# Patient Record
Sex: Male | Born: 1950
Health system: Southern US, Community
[De-identification: ages and names within clinical notes are randomized; demographics above are authoritative.]

## PROBLEM LIST (undated history)

## (undated) DIAGNOSIS — R945 Abnormal results of liver function studies: Secondary | ICD-10-CM

## (undated) DIAGNOSIS — I10 Essential (primary) hypertension: Secondary | ICD-10-CM

## (undated) DIAGNOSIS — Z87442 Personal history of urinary calculi: Secondary | ICD-10-CM

## (undated) DIAGNOSIS — K219 Gastro-esophageal reflux disease without esophagitis: Secondary | ICD-10-CM

## (undated) DIAGNOSIS — C9141 Hairy cell leukemia, in remission: Secondary | ICD-10-CM

## (undated) DIAGNOSIS — G4730 Sleep apnea, unspecified: Secondary | ICD-10-CM

## (undated) DIAGNOSIS — K589 Irritable bowel syndrome without diarrhea: Secondary | ICD-10-CM

## (undated) DIAGNOSIS — R740 Nonspecific elevation of levels of transaminase and lactic acid dehydrogenase [LDH]: Secondary | ICD-10-CM

## (undated) DIAGNOSIS — R7401 Elevation of levels of liver transaminase levels: Secondary | ICD-10-CM

## (undated) DIAGNOSIS — T8859XA Other complications of anesthesia, initial encounter: Secondary | ICD-10-CM

## (undated) DIAGNOSIS — M1990 Unspecified osteoarthritis, unspecified site: Secondary | ICD-10-CM

## (undated) DIAGNOSIS — N189 Chronic kidney disease, unspecified: Secondary | ICD-10-CM

## (undated) DIAGNOSIS — N400 Enlarged prostate without lower urinary tract symptoms: Secondary | ICD-10-CM

## (undated) HISTORY — DX: Benign prostatic hyperplasia without lower urinary tract symptoms: N40.0

## (undated) HISTORY — PX: OTHER SURGICAL HISTORY: SHX169

## (undated) HISTORY — DX: Irritable bowel syndrome without diarrhea: K58.9

## (undated) HISTORY — DX: Elevation of levels of liver transaminase levels: R74.01

## (undated) HISTORY — DX: Nonspecific elevation of levels of transaminase and lactic acid dehydrogenase (ldh): R74.0

## (undated) HISTORY — PX: TONSILLECTOMY: SUR1361

## (undated) HISTORY — DX: Abnormal results of liver function studies: R94.5

## (undated) HISTORY — PX: BACK SURGERY: SHX140

## (undated) HISTORY — DX: Hairy cell leukemia, in remission: C91.41

## (undated) HISTORY — PX: BONE MARROW BIOPSY: SHX199

## (undated) HISTORY — DX: Irritable bowel syndrome, unspecified: K58.9

---

## 1990-04-19 DIAGNOSIS — C9141 Hairy cell leukemia, in remission: Secondary | ICD-10-CM

## 1990-04-19 HISTORY — DX: Hairy cell leukemia, in remission: C91.41

## 1998-09-04 ENCOUNTER — Other Ambulatory Visit: Admission: RE | Admit: 1998-09-04 | Discharge: 1998-09-04 | Payer: Self-pay | Admitting: Oncology

## 1998-09-29 ENCOUNTER — Inpatient Hospital Stay (HOSPITAL_COMMUNITY): Admission: AD | Admit: 1998-09-29 | Discharge: 1998-10-30 | Payer: Self-pay | Admitting: Oncology

## 1998-09-29 ENCOUNTER — Encounter: Payer: Self-pay | Admitting: Oncology

## 1998-10-10 ENCOUNTER — Encounter: Payer: Self-pay | Admitting: Oncology

## 1998-10-25 ENCOUNTER — Encounter: Payer: Self-pay | Admitting: Oncology

## 1998-11-02 ENCOUNTER — Ambulatory Visit (HOSPITAL_COMMUNITY): Admission: RE | Admit: 1998-11-02 | Discharge: 1998-11-02 | Payer: Self-pay | Admitting: Oncology

## 1998-12-04 ENCOUNTER — Ambulatory Visit: Admission: RE | Admit: 1998-12-04 | Discharge: 1998-12-04 | Payer: Self-pay | Admitting: Oncology

## 2001-05-15 ENCOUNTER — Ambulatory Visit (HOSPITAL_COMMUNITY): Admission: RE | Admit: 2001-05-15 | Discharge: 2001-05-15 | Payer: Self-pay | Admitting: Internal Medicine

## 2005-12-02 ENCOUNTER — Encounter (INDEPENDENT_AMBULATORY_CARE_PROVIDER_SITE_OTHER): Payer: Self-pay | Admitting: *Deleted

## 2005-12-02 ENCOUNTER — Ambulatory Visit: Payer: Self-pay | Admitting: Oncology

## 2005-12-02 ENCOUNTER — Ambulatory Visit (HOSPITAL_COMMUNITY): Admission: RE | Admit: 2005-12-02 | Discharge: 2005-12-02 | Payer: Self-pay | Admitting: Oncology

## 2005-12-02 ENCOUNTER — Encounter: Payer: Self-pay | Admitting: Oncology

## 2006-02-09 ENCOUNTER — Ambulatory Visit: Payer: Self-pay | Admitting: Oncology

## 2006-02-12 ENCOUNTER — Ambulatory Visit: Payer: Self-pay | Admitting: Oncology

## 2006-04-19 HISTORY — PX: COLONOSCOPY: SHX174

## 2006-06-13 ENCOUNTER — Ambulatory Visit: Payer: Self-pay | Admitting: Oncology

## 2006-06-15 LAB — COMPREHENSIVE METABOLIC PANEL
ALT: 38 U/L (ref 0–53)
Alkaline Phosphatase: 103 U/L (ref 39–117)
Creatinine, Ser: 1.09 mg/dL (ref 0.40–1.50)
Glucose, Bld: 76 mg/dL (ref 70–99)
Sodium: 142 mEq/L (ref 135–145)
Total Bilirubin: 0.5 mg/dL (ref 0.3–1.2)
Total Protein: 6.6 g/dL (ref 6.0–8.3)

## 2006-06-15 LAB — CBC WITH DIFFERENTIAL/PLATELET
EOS%: 0.7 % (ref 0.0–7.0)
LYMPH%: 15.3 % (ref 14.0–48.0)
MCH: 31 pg (ref 28.0–33.4)
MCHC: 35 g/dL (ref 32.0–35.9)
MCV: 88.6 fL (ref 81.6–98.0)
MONO%: 8.2 % (ref 0.0–13.0)
Platelets: 174 10*3/uL (ref 145–400)
RBC: 4.85 10*6/uL (ref 4.20–5.71)
RDW: 13.6 % (ref 11.2–14.6)

## 2006-06-15 LAB — LACTATE DEHYDROGENASE: LDH: 168 U/L (ref 94–250)

## 2006-06-15 LAB — CHCC SMEAR

## 2006-07-12 ENCOUNTER — Ambulatory Visit (HOSPITAL_COMMUNITY): Admission: RE | Admit: 2006-07-12 | Discharge: 2006-07-12 | Payer: Self-pay | Admitting: Internal Medicine

## 2006-07-12 ENCOUNTER — Encounter (INDEPENDENT_AMBULATORY_CARE_PROVIDER_SITE_OTHER): Payer: Self-pay | Admitting: *Deleted

## 2006-07-12 ENCOUNTER — Ambulatory Visit: Payer: Self-pay | Admitting: Internal Medicine

## 2006-08-02 ENCOUNTER — Ambulatory Visit (HOSPITAL_COMMUNITY): Admission: RE | Admit: 2006-08-02 | Discharge: 2006-08-02 | Payer: Self-pay | Admitting: Internal Medicine

## 2006-12-14 ENCOUNTER — Ambulatory Visit: Payer: Self-pay | Admitting: Oncology

## 2006-12-16 LAB — COMPREHENSIVE METABOLIC PANEL
ALT: 34 U/L (ref 0–53)
CO2: 26 mEq/L (ref 19–32)
Calcium: 9.1 mg/dL (ref 8.4–10.5)
Chloride: 107 mEq/L (ref 96–112)
Creatinine, Ser: 1.12 mg/dL (ref 0.40–1.50)
Glucose, Bld: 101 mg/dL — ABNORMAL HIGH (ref 70–99)
Total Protein: 6.4 g/dL (ref 6.0–8.3)

## 2006-12-16 LAB — CBC WITH DIFFERENTIAL/PLATELET
BASO%: 0.2 % (ref 0.0–2.0)
Basophils Absolute: 0 10*3/uL (ref 0.0–0.1)
HCT: 41.4 % (ref 38.7–49.9)
HGB: 14.6 g/dL (ref 13.0–17.1)
LYMPH%: 15 % (ref 14.0–48.0)
MCHC: 35.4 g/dL (ref 32.0–35.9)
MONO#: 0.5 10*3/uL (ref 0.1–0.9)
NEUT%: 76.5 % — ABNORMAL HIGH (ref 40.0–75.0)
Platelets: 175 10*3/uL (ref 145–400)
WBC: 6.9 10*3/uL (ref 4.0–10.0)
lymph#: 1 10*3/uL (ref 0.9–3.3)

## 2006-12-16 LAB — MORPHOLOGY: PLT EST: ADEQUATE

## 2006-12-16 LAB — CHCC SMEAR

## 2006-12-16 LAB — LACTATE DEHYDROGENASE: LDH: 173 U/L (ref 94–250)

## 2007-06-02 ENCOUNTER — Ambulatory Visit: Payer: Self-pay | Admitting: Oncology

## 2007-06-06 LAB — COMPREHENSIVE METABOLIC PANEL
ALT: 38 U/L (ref 0–53)
BUN: 17 mg/dL (ref 6–23)
CO2: 29 mEq/L (ref 19–32)
Creatinine, Ser: 1.08 mg/dL (ref 0.40–1.50)
Total Bilirubin: 0.8 mg/dL (ref 0.3–1.2)

## 2007-06-06 LAB — CBC WITH DIFFERENTIAL/PLATELET
HCT: 42.5 % (ref 38.7–49.9)
HGB: 15 g/dL (ref 13.0–17.1)
MCH: 31.1 pg (ref 28.0–33.4)
MCHC: 35.3 g/dL (ref 32.0–35.9)
MONO%: 7.5 % (ref 0.0–13.0)
Platelets: 180 10*3/uL (ref 145–400)
lymph#: 1.1 10*3/uL (ref 0.9–3.3)

## 2007-06-06 LAB — CHCC SMEAR

## 2007-06-06 LAB — LACTATE DEHYDROGENASE: LDH: 168 U/L (ref 94–250)

## 2007-06-06 LAB — MORPHOLOGY

## 2007-09-01 ENCOUNTER — Encounter (INDEPENDENT_AMBULATORY_CARE_PROVIDER_SITE_OTHER): Payer: Self-pay | Admitting: General Surgery

## 2007-09-01 ENCOUNTER — Ambulatory Visit: Payer: Self-pay | Admitting: Oncology

## 2007-09-01 ENCOUNTER — Ambulatory Visit (HOSPITAL_COMMUNITY): Admission: RE | Admit: 2007-09-01 | Discharge: 2007-09-01 | Payer: Self-pay | Admitting: General Surgery

## 2007-12-01 ENCOUNTER — Ambulatory Visit: Payer: Self-pay | Admitting: Oncology

## 2007-12-05 LAB — CBC WITH DIFFERENTIAL/PLATELET
Basophils Absolute: 0 10*3/uL (ref 0.0–0.1)
EOS%: 0.7 % (ref 0.0–7.0)
Eosinophils Absolute: 0 10*3/uL (ref 0.0–0.5)
LYMPH%: 21.2 % (ref 14.0–48.0)
MCH: 31 pg (ref 28.0–33.4)
MCV: 89.7 fL (ref 81.6–98.0)
MONO%: 7 % (ref 0.0–13.0)
NEUT#: 4 10*3/uL (ref 1.5–6.5)
Platelets: 184 10*3/uL (ref 145–400)
RBC: 4.91 10*6/uL (ref 4.20–5.71)
RDW: 14.3 % (ref 11.2–14.6)

## 2007-12-05 LAB — MORPHOLOGY: RBC Comments: NORMAL

## 2008-03-01 ENCOUNTER — Ambulatory Visit: Payer: Self-pay | Admitting: Oncology

## 2008-03-05 LAB — CBC WITH DIFFERENTIAL/PLATELET
BASO%: 0.2 % (ref 0.0–2.0)
EOS%: 1.4 % (ref 0.0–7.0)
Eosinophils Absolute: 0.1 10*3/uL (ref 0.0–0.5)
LYMPH%: 18.6 % (ref 14.0–48.0)
MCH: 30.9 pg (ref 28.0–33.4)
MCHC: 34.1 g/dL (ref 32.0–35.9)
MCV: 90.5 fL (ref 81.6–98.0)
MONO%: 7.5 % (ref 0.0–13.0)
NEUT#: 4.1 10*3/uL (ref 1.5–6.5)
Platelets: 183 10*3/uL (ref 145–400)
RBC: 4.96 10*6/uL (ref 4.20–5.71)
RDW: 13.3 % (ref 11.2–14.6)

## 2008-03-05 LAB — MORPHOLOGY: RBC Comments: NORMAL

## 2008-06-27 ENCOUNTER — Ambulatory Visit: Payer: Self-pay | Admitting: Oncology

## 2008-07-01 LAB — MORPHOLOGY
PLT EST: ADEQUATE
RBC Comments: NORMAL

## 2008-07-01 LAB — CBC WITH DIFFERENTIAL/PLATELET
Basophils Absolute: 0 10*3/uL (ref 0.0–0.1)
Eosinophils Absolute: 0.1 10*3/uL (ref 0.0–0.5)
HCT: 46.3 % (ref 38.4–49.9)
HGB: 15.8 g/dL (ref 13.0–17.1)
LYMPH%: 18.5 % (ref 14.0–49.0)
MCV: 91.4 fL (ref 79.3–98.0)
MONO%: 7.6 % (ref 0.0–14.0)
NEUT#: 5.8 10*3/uL (ref 1.5–6.5)
NEUT%: 72.7 % (ref 39.0–75.0)
Platelets: 212 10*3/uL (ref 140–400)

## 2008-10-24 ENCOUNTER — Ambulatory Visit: Payer: Self-pay | Admitting: Oncology

## 2009-02-27 ENCOUNTER — Ambulatory Visit: Payer: Self-pay | Admitting: Oncology

## 2009-03-03 LAB — CBC WITH DIFFERENTIAL/PLATELET
Basophils Absolute: 0 10*3/uL (ref 0.0–0.1)
EOS%: 0.4 % (ref 0.0–7.0)
HGB: 15.7 g/dL (ref 13.0–17.1)
LYMPH%: 12.8 % — ABNORMAL LOW (ref 14.0–49.0)
MCH: 31.6 pg (ref 27.2–33.4)
MCV: 92.6 fL (ref 79.3–98.0)
MONO%: 4.5 % (ref 0.0–14.0)
NEUT%: 81.9 % — ABNORMAL HIGH (ref 39.0–75.0)
Platelets: 253 10*3/uL (ref 140–400)
RDW: 13.8 % (ref 11.0–14.6)

## 2009-03-03 LAB — COMPREHENSIVE METABOLIC PANEL
ALT: 81 U/L — ABNORMAL HIGH (ref 0–53)
Albumin: 4.7 g/dL (ref 3.5–5.2)
CO2: 30 mEq/L (ref 19–32)
Chloride: 102 mEq/L (ref 96–112)
Glucose, Bld: 102 mg/dL — ABNORMAL HIGH (ref 70–99)
Potassium: 4.5 mEq/L (ref 3.5–5.3)
Sodium: 142 mEq/L (ref 135–145)
Total Protein: 7 g/dL (ref 6.0–8.3)

## 2009-03-03 LAB — SEDIMENTATION RATE: Sed Rate: 17 mm/hr — ABNORMAL HIGH (ref 0–16)

## 2009-03-12 LAB — HEPATIC FUNCTION PANEL
Alkaline Phosphatase: 126 U/L — ABNORMAL HIGH (ref 39–117)
Indirect Bilirubin: 0.3 mg/dL (ref 0.0–0.9)
Total Bilirubin: 0.4 mg/dL (ref 0.3–1.2)
Total Protein: 6.8 g/dL (ref 6.0–8.3)

## 2009-03-25 ENCOUNTER — Ambulatory Visit (HOSPITAL_COMMUNITY): Admission: RE | Admit: 2009-03-25 | Discharge: 2009-03-25 | Payer: Self-pay | Admitting: Oncology

## 2009-03-28 ENCOUNTER — Ambulatory Visit: Payer: Self-pay | Admitting: Oncology

## 2009-04-01 LAB — GAMMA GT: GGT: 158 U/L — ABNORMAL HIGH (ref 7–51)

## 2009-04-01 LAB — HEPATIC FUNCTION PANEL
ALT: 57 U/L — ABNORMAL HIGH (ref 0–53)
AST: 31 U/L (ref 0–37)
Bilirubin, Direct: 0.1 mg/dL (ref 0.0–0.3)

## 2009-06-26 ENCOUNTER — Ambulatory Visit: Payer: Self-pay | Admitting: Oncology

## 2009-06-30 LAB — CBC WITH DIFFERENTIAL/PLATELET
BASO%: 0.2 % (ref 0.0–2.0)
EOS%: 0.8 % (ref 0.0–7.0)
HCT: 44.2 % (ref 38.4–49.9)
HGB: 15.2 g/dL (ref 13.0–17.1)
MCH: 31.8 pg (ref 27.2–33.4)
MCV: 92.2 fL (ref 79.3–98.0)
MONO%: 6.1 % (ref 0.0–14.0)
NEUT#: 4.9 10*3/uL (ref 1.5–6.5)
RBC: 4.8 10*6/uL (ref 4.20–5.82)
RDW: 14 % (ref 11.0–14.6)
WBC: 6.9 10*3/uL (ref 4.0–10.3)
lymph#: 1.5 10*3/uL (ref 0.9–3.3)

## 2009-06-30 LAB — MORPHOLOGY
PLT EST: ADEQUATE
RBC Comments: NORMAL

## 2009-10-30 ENCOUNTER — Ambulatory Visit: Payer: Self-pay | Admitting: Oncology

## 2009-11-03 LAB — CBC WITH DIFFERENTIAL/PLATELET
Basophils Absolute: 0 10*3/uL (ref 0.0–0.1)
EOS%: 0.8 % (ref 0.0–7.0)
HGB: 15 g/dL (ref 13.0–17.1)
MCH: 31.4 pg (ref 27.2–33.4)
MCV: 92.6 fL (ref 79.3–98.0)
MONO%: 6.6 % (ref 0.0–14.0)
RDW: 14 % (ref 11.0–14.6)

## 2009-11-03 LAB — MORPHOLOGY

## 2010-02-26 ENCOUNTER — Ambulatory Visit: Payer: Self-pay | Admitting: Oncology

## 2010-03-02 LAB — CBC WITH DIFFERENTIAL/PLATELET
BASO%: 0.2 % (ref 0.0–2.0)
EOS%: 0.7 % (ref 0.0–7.0)
Eosinophils Absolute: 0 10*3/uL (ref 0.0–0.5)
LYMPH%: 23.5 % (ref 14.0–49.0)
MCH: 31.2 pg (ref 27.2–33.4)
MCHC: 33.8 g/dL (ref 32.0–36.0)
MCV: 92.3 fL (ref 79.3–98.0)
MONO%: 7.9 % (ref 0.0–14.0)
Platelets: 148 10*3/uL (ref 140–400)
RBC: 4.91 10*6/uL (ref 4.20–5.82)

## 2010-03-02 LAB — COMPREHENSIVE METABOLIC PANEL
Albumin: 4.6 g/dL (ref 3.5–5.2)
BUN: 15 mg/dL (ref 6–23)
CO2: 30 mEq/L (ref 19–32)
Glucose, Bld: 65 mg/dL — ABNORMAL LOW (ref 70–99)
Sodium: 143 mEq/L (ref 135–145)
Total Bilirubin: 0.5 mg/dL (ref 0.3–1.2)
Total Protein: 7 g/dL (ref 6.0–8.3)

## 2010-03-02 LAB — LACTATE DEHYDROGENASE: LDH: 163 U/L (ref 94–250)

## 2010-03-02 LAB — MORPHOLOGY

## 2010-03-10 ENCOUNTER — Ambulatory Visit (HOSPITAL_COMMUNITY): Admission: RE | Admit: 2010-03-10 | Discharge: 2010-03-10 | Payer: Self-pay | Admitting: Oncology

## 2010-03-19 LAB — HEPATITIS PANEL, ACUTE
HCV Ab: NEGATIVE
Hep B C IgM: NEGATIVE
Hepatitis B Surface Ag: NEGATIVE

## 2010-05-09 ENCOUNTER — Encounter: Payer: Self-pay | Admitting: Oncology

## 2010-06-29 ENCOUNTER — Other Ambulatory Visit: Payer: Self-pay | Admitting: Oncology

## 2010-06-29 ENCOUNTER — Encounter (HOSPITAL_BASED_OUTPATIENT_CLINIC_OR_DEPARTMENT_OTHER): Payer: BC Managed Care – PPO | Admitting: Oncology

## 2010-06-29 DIAGNOSIS — C9140 Hairy cell leukemia not having achieved remission: Secondary | ICD-10-CM

## 2010-06-29 LAB — CBC WITH DIFFERENTIAL/PLATELET
BASO%: 0.4 % (ref 0.0–2.0)
EOS%: 0.5 % (ref 0.0–7.0)
HCT: 41.8 % (ref 38.4–49.9)
LYMPH%: 22 % (ref 14.0–49.0)
MCH: 31.6 pg (ref 27.2–33.4)
MCHC: 34.3 g/dL (ref 32.0–36.0)
NEUT%: 70.1 % (ref 39.0–75.0)
Platelets: 184 10*3/uL (ref 140–400)

## 2010-09-01 NOTE — Op Note (Signed)
Cameron Wu, MURTHA             ACCOUNT NO.:  [id number]   MEDICAL RECORD NO.:  [id number]          PATIENT TYPE:  AMB   LOCATION:  DAY                           FACILITY:  [facility]   PHYSICIAN:  Tilford Pillar, MD      DATE OF BIRTH:  Apr 01, 1951   DATE OF PROCEDURE:  09/01/2007  DATE OF DISCHARGE:                               OPERATIVE REPORT   PREOPERATIVE DIAGNOSIS:  Perirectal skin lesion.   POSTOPERATIVE DIAGNOSIS:  Perirectal skin lesion.   PROCEDURE:  Incisional biopsy of perirectal skin lesion with a 1-cm  incision.   SURGEON:  Tilford Pillar, MD   ANESTHESIA:  Sedation with laryngeal mask airway, local anesthetic 1%  lidocaine with epinephrine.   SPECIMEN:  Skin biopsy for cultures including fungal cultures and  pathologic evaluation.   ESTIMATED BLOOD LOSS:  Minimal.   INDICATIONS:  The patient is a 60 year old male who had presented  multiple times to my office with a noted perirectal erythema.  This had  been treated with both antibiotics as well as antifungal agents.  He  actually did show some improvement with the antifungals but had  immediate recurrence upon completion of his antifungal regimen.  At this  point, as he has had continued persistence of this erythema, it was  advised he undergo a skin biopsy to ensure no underlying etiology was  occurring.  The risks, benefits, and alternatives were discussed with  the patient.  The patient's questions and concerns were addressed.  The  patient was consented for planned procedure.   OPERATION IN DETAIL:  The patient was taken to the operating room.  He  was placed in a supine position on the operating table at which time the  sedation was administered.  Once the patient was asleep, laryngeal mask  airway was placed.  The patient was then placed in bilateral [place]  stirrups and was placed in a Trendelenburg position.  At this point,  prep was applied using a Betadine prep and drapes were placed in  standard  fashion.  Using a scalpel, an approximate 1-cm incisional  biopsy was created at the skin.  This was placed in the back table and  was divided for both pathology evaluation as well as microbiology  evaluation for possible fungal infections.  At this point, the local  anesthetic was instilled and 4-0 Monocryl was utilized to reapproximate  the skin edges.  Skin was washed and dried with moist and dry towel.  Antibiotic ointment was placed over the wound.  A sterile 4 x 4 dressing  was rolled and then placed into the gluteal crease over the biopsy site  and then the drapes removed.  The patient was allowed to  come out of general anesthetic.  He was transferred back to regular  hospital bed and was transferred to postanesthetic care unit in stable  condition.  At the conclusion of procedure, all instrument, sponge, and  needle counts were correct.  The patient tolerated the procedure well.      Tilford Pillar, MD  Electronically Signed     BZ/MEDQ  D:  09/01/2007  T:  09/01/2007  Job:  [id number]   cc:   Kingsley Callander. Ouida Sills, MD  Fax: [phone]

## 2010-09-01 NOTE — H&P (Signed)
Cameron Wu, Cameron Wu             ACCOUNT NO.:  [id number]   MEDICAL RECORD NO.:  [id number]          PATIENT TYPE:  AMB   LOCATION:  DAY                           FACILITY:  [facility]   PHYSICIAN:  Tilford Pillar, MD      DATE OF BIRTH:  19-Sep-1950   DATE OF ADMISSION:  09/01/2007  DATE OF DISCHARGE:  LH                              HISTORY & PHYSICAL   CHIEF COMPLAINT:  Perianal pruritus and erythema.   HISTORY OF PRESENT ILLNESS:  The patient is a 60 year old male who is  well known to me as an outpatient, who presented several months ago with  perianal pruritus and erythema.  This has been ongoing off and on over  the last several months.  His first evaluation in my office was on June 22, 2007.  He does have a history of hairy cell leukemia treatment, but  he last completed this treatment over 2 years ago.  He was given  initially some antibiotics by his primary physician with little success  and some application of some perianal cream, again with little success.  During his outpatient course with me, I did initiate him on some  antifungal medications with noted improvement, but had recurrence upon  completion of both courses, the last one being relatively long.  It was  discussed with the patient that should the symptomatology continue as  well as the erythema, a biopsy may be required.  He has not had any  history of diarrhea.  No pain in the area.  No dyspareunia, no  hematochezia, no melena.  No weight changes.   PAST MEDICAL HISTORY:  1. Hairy cell leukemia.  2. Seasonal allergies.   PAST SURGICAL HISTORY:  None.   MEDICATION:  He does take aspirin and Allegra.  He has been on treatment  of Diflucan for this with some resolution of the symptoms.  He is  currently off for evaluation with the biopsy.   ALLERGIES:  He is allergic to PENICILLIN.   SOCIAL HISTORY:  No tobacco use.  He occasionally drinks alcohol.  No  recreational drug use.   REVIEW OF SYSTEMS:  All systems  are unremarkable except for HPI.   PHYSICAL EXAMINATION:  GENERAL:  The patient is a healthy, somewhat thin  individual, does not appear cachectic.  He is calm.  He does not appear  in any acute distress.  He is alert and oriented x3.  HEENT:  Scalp, no deformities, no masses.  Eyes, pupils equal, round,  and reactive.  Extraocular movements are intact.  No conjunctival pallor  is noted.  NECK:  Trachea is midline.  No goiter.  No cervical lymphadenopathy.  PULMONARY:  Unlabored respirations.  He is clear to auscultation  bilaterally.  CARDIOVASCULAR:  Regular rate and rhythm.  Radial and dorsalis pedis 2+  pulses bilaterally.  ABDOMEN:  Positive bowel sounds.  Abdomen is soft and nontender.  No  hernias.  No masses.  RECTAL:  On perirectal examination, he does have perianal erythema,  slight discomfort on evaluation.  No masses.  No fissures or hemorrhoids  are noted.  GENITAL:  Normal external-appearing male genitalia.  Bilateral descended  testicles.  SKIN:  Warm and dry.   ASSESSMENT AND PLAN:  At this point, based on the continued course as  discussed with the patient, I do recommend biopsy of this area.  He does  have pruritus ani and my suspicion is a fungal etiology.  Although I  have a low suspicion of Paget disease or something such as __________. I  do recommend an incisional biopsy of this area to evaluate for any  underlying process.  As this appears simply to be a fungal etiology, a  long-term course of antifungal medications may be required.  This was  discussed at length with the patient.  The patient's questions and  concerns were addressed regarding the biopsy, and we will plan to  proceed with a biopsy in the operating room at the patient's earliest  convenience.      Tilford Pillar, MD  Electronically Signed     BZ/MEDQ  D:  08/31/2007  T:  09/01/2007  Job:  [id number]   cc:   Kingsley Callander. Ouida Sills, MD  Fax: [phone]   [facility]

## 2010-10-05 LAB — HEPATIC FUNCTION PANEL: ALT: 81 U/L — AB (ref 10–40)

## 2010-10-05 LAB — BASIC METABOLIC PANEL: Sodium: 141 mmol/L (ref 137–147)

## 2010-10-05 LAB — CBC AND DIFFERENTIAL
HCT: 44 % (ref 41–53)
Platelets: 159 10*3/uL (ref 150–399)

## 2010-11-02 ENCOUNTER — Encounter (HOSPITAL_BASED_OUTPATIENT_CLINIC_OR_DEPARTMENT_OTHER): Payer: BC Managed Care – PPO | Admitting: Oncology

## 2010-11-02 ENCOUNTER — Other Ambulatory Visit: Payer: Self-pay | Admitting: Oncology

## 2010-11-02 DIAGNOSIS — C9140 Hairy cell leukemia not having achieved remission: Secondary | ICD-10-CM

## 2010-11-02 LAB — CBC WITH DIFFERENTIAL/PLATELET
Basophils Absolute: 0 10*3/uL (ref 0.0–0.1)
EOS%: 0.9 % (ref 0.0–7.0)
HCT: 40.2 % (ref 38.4–49.9)
HGB: 13.9 g/dL (ref 13.0–17.1)
MCH: 32.3 pg (ref 27.2–33.4)
MCV: 93.5 fL (ref 79.3–98.0)
MONO%: 5.7 % (ref 0.0–14.0)
NEUT%: 68 % (ref 39.0–75.0)
Platelets: 169 10*3/uL (ref 140–400)

## 2010-11-02 LAB — MORPHOLOGY

## 2010-11-17 ENCOUNTER — Encounter (INDEPENDENT_AMBULATORY_CARE_PROVIDER_SITE_OTHER): Payer: Self-pay

## 2010-12-02 ENCOUNTER — Ambulatory Visit (INDEPENDENT_AMBULATORY_CARE_PROVIDER_SITE_OTHER): Payer: BC Managed Care – PPO | Admitting: Internal Medicine

## 2010-12-14 ENCOUNTER — Encounter (INDEPENDENT_AMBULATORY_CARE_PROVIDER_SITE_OTHER): Payer: Self-pay | Admitting: Internal Medicine

## 2010-12-14 ENCOUNTER — Ambulatory Visit (INDEPENDENT_AMBULATORY_CARE_PROVIDER_SITE_OTHER): Payer: BC Managed Care – PPO | Admitting: Internal Medicine

## 2010-12-14 ENCOUNTER — Other Ambulatory Visit (INDEPENDENT_AMBULATORY_CARE_PROVIDER_SITE_OTHER): Payer: Self-pay | Admitting: Internal Medicine

## 2010-12-14 VITALS — BP 110/60 | HR 72 | Temp 98.6°F | Ht 72.0 in | Wt 184.7 lb

## 2010-12-14 DIAGNOSIS — R945 Abnormal results of liver function studies: Secondary | ICD-10-CM

## 2010-12-14 NOTE — Progress Notes (Signed)
Subjective:     Patient ID: Cameron Wu, male   DOB: Jun 04, 1950, 60 y.o.   MRN: [id number]  HPI  Referred by Dr. Ouida Sills for elevated tranaminases.   Noted 10/06/2010 ALP normal 87, Bili 0.8,  AST 46H, ALT 82H.                          11/1                                                                   07/2009 ALP 97,                           AST 58H, ALT 94H               09/18/2009   ALP 90                            AST 24   ALT 38              03/03/2009 ALP 162                        AST 36,   ALT 81 03/20/2010:  Hepatitis B Surface Antigen Negative, Hepatitis B core Ab, IgM negative, Hepatitis A Antibody, IgM negative, Hepatitis C antibody negative. 03/10/2010 MRI Abdomen WO/W CM: Unremarkable MR examination of the abdomen. The liver is unremarkable. No focal lesions or infiltrative process. Normal size and contour. 03/25/2009: US abdomen for abnormal liver function: Negative abdominal US. CBD  3mm in diameter.  Appetite is good. No weight loss unintentional.  He occasionally has a nervous stomach, growling after he eats.  He usually has a BM daily. Denies rectal bleeding or melena.  No dysphagia. Appetite is good.   Current Outpatient Prescriptions  Medication Sig Dispense Refill  . aspirin 81 MG tablet Take 81 mg by mouth daily.        . cimetidine (TAGAMET) 200 MG tablet Take 200 mg by mouth as needed.        . diphenhydrAMINE (SOMINEX) 25 MG tablet Take 25 mg by mouth at bedtime as needed.        . fish oil-omega-3 fatty acids 1000 MG capsule Take 2 g by mouth daily.       Marland Kitchen ibuprofen (ADVIL,MOTRIN) 100 MG tablet Take 100 mg by mouth every 6 (six) hours as needed.        . loperamide (IMODIUM) 2 MG capsule Take 2 mg by mouth 4 (four) times daily as needed.        . loratadine (CLARITIN) 10 MG tablet Take 10 mg by mouth as needed.       . magnesium oxide (MAG-OX) 400 MG tablet Take 400 mg by mouth daily.        . multivitamin (THERAGRAN) per tablet Take 1 tablet by mouth daily.         . Tamsulosin HCl (FLOMAX) 0.4 MG CAPS Take by mouth.        . cladribine (LEUSTATIN) 1 MG/ML injection Inject into the vein once.        . loperamide (  IMODIUM) 1 MG/5ML solution Take by mouth 4 (four) times daily as needed.        . riTUXimab (RITUXAN) 10 MG/ML injection Inject into the vein.         Past Medical History  Diagnosis Date  . Hairy cell leukemia   . Elevated transaminase level   . Hairy cell leukemia     1992  . Irritable bowel syndrome     2 yrs ago, states he had inflammation around anus. Biopsy: inclusive   Past Surgical History  Procedure Date  . Hx colon polyps   . Colonoscopy 08    with polyps   No family history on file. Family Status  Relation Status Death Age  . Mother Deceased     breast cancer  . Father Deceased     cva  . Sister Alive     good health   History   Social History  . Marital Status: Married    Spouse Name: N/A    Number of Children: N/A  . Years of Education: N/A   Occupational History  . Not on file.   Social History Main Topics  . Smoking status: Never Smoker   . Smokeless tobacco: Not on file  . Alcohol Use: No  . Drug Use: No  . Sexually Active: Not on file   Other Topics Concern  . Not on file   Social History Narrative  . No narrative on file   He does not smoke, drink, or do drugs.    Review of Systems see hpi     Objective:   Physical Exam Blood pressure 110/60, pulse 72, temperature 98.6 F (37 C), height 6' (1.829 m), weight 184 lb 11.2 oz (83.779 kg).  Alert and oriented. Skin warm and dry.No jaundice Oral mucosa is moist. Natural teeth in good condition. Sclera anicteric, conjunctivae is pink. Thyroid not enlarged. No cervical lymphadenopathy. Lungs clear. Heart regular rate and rhythm.  Abdomen is soft. Bowel sounds are positive. No hepatomegaly. No abdominal masses felt. No tenderness.  No edema to lower extremities. Patient is alert and oriented.      Assessment:    Elevated transaminases  dating back to 2010.  So far his work up has been negative.  Auto immune process needs to be ruled out.  Plan:    Will repeat levels.  He will have follow up in one month.  C-met, Ferritin, SMA, ANA, ceruplasmin, Alpha 1 antitrypsin and sed rate.  He was encouraged to exercise. He will have an OV in 1 month while Dr Karilyn Cota is here in office.

## 2010-12-15 LAB — ALPHA-1-ANTITRYPSIN: A-1 Antitrypsin, Ser: 111 mg/dL (ref 90–200)

## 2010-12-22 ENCOUNTER — Telehealth (INDEPENDENT_AMBULATORY_CARE_PROVIDER_SITE_OTHER): Payer: Self-pay | Admitting: Internal Medicine

## 2010-12-22 DIAGNOSIS — K760 Fatty (change of) liver, not elsewhere classified: Secondary | ICD-10-CM

## 2010-12-22 NOTE — Telephone Encounter (Signed)
Labs were not drawn for this patient. I reordered.

## 2010-12-23 ENCOUNTER — Other Ambulatory Visit (INDEPENDENT_AMBULATORY_CARE_PROVIDER_SITE_OTHER): Payer: Self-pay | Admitting: Internal Medicine

## 2010-12-23 ENCOUNTER — Telehealth (INDEPENDENT_AMBULATORY_CARE_PROVIDER_SITE_OTHER): Payer: Self-pay | Admitting: Internal Medicine

## 2010-12-23 LAB — SEDIMENTATION RATE: Sed Rate: 4 mm/hr (ref 0–16)

## 2010-12-23 LAB — ANTI-SMOOTH MUSCLE ANTIBODY, IGG: Smooth Muscle Ab: 3 U (ref <20)

## 2010-12-23 LAB — HEPATIC FUNCTION PANEL
Albumin: 4.4 g/dL (ref 3.5–5.2)
Indirect Bilirubin: 0.4 mg/dL (ref 0.0–0.9)
Total Protein: 6.3 g/dL (ref 6.0–8.3)

## 2010-12-23 LAB — ANA: Anti Nuclear Antibody(ANA): NEGATIVE

## 2010-12-23 NOTE — Telephone Encounter (Signed)
Results given to patient

## 2011-01-11 ENCOUNTER — Encounter (INDEPENDENT_AMBULATORY_CARE_PROVIDER_SITE_OTHER): Payer: Self-pay | Admitting: Internal Medicine

## 2011-01-11 ENCOUNTER — Ambulatory Visit (INDEPENDENT_AMBULATORY_CARE_PROVIDER_SITE_OTHER): Payer: BC Managed Care – PPO | Admitting: Internal Medicine

## 2011-01-11 VITALS — BP 110/70 | HR 66 | Temp 99.0°F | Resp 12 | Ht 66.0 in

## 2011-01-11 DIAGNOSIS — R7402 Elevation of levels of lactic acid dehydrogenase (LDH): Secondary | ICD-10-CM

## 2011-01-11 DIAGNOSIS — R21 Rash and other nonspecific skin eruption: Secondary | ICD-10-CM

## 2011-01-11 MED ORDER — NYSTATIN-TRIAMCINOLONE 100000-0.1 UNIT/GM-% EX OINT: TOPICAL_OINTMENT | Freq: Two times a day (BID) | CUTANEOUS | Status: DC

## 2011-01-11 NOTE — Patient Instructions (Addendum)
Apply medication to perianal area twice daily for 2 weeks, then on as needed basis. LFT in 3 months.

## 2011-01-12 NOTE — Progress Notes (Signed)
Presenting complaint; Follow-up elevated transaminases;  patient also complains of perianal discomfort and pruritis. Subjective; Cameron Wu is 60 year old male who is hers for scheduled visit regarding elevated transaminases.These were noted be mildly elevated in nov,2010 but normal in June,2011 and up again in April,2011 and June,2012. In June,2012 his AST was 46 and ALT was 82 and AP was normal at 87. His markers for Hep A, B and C have been negative. [place]( 03/26/11) and MRI of liver(03/10/10) similarly did not reveal any abnormality. Since his last OV to our office he has had SMA, ANA, ceruloplasmin,  alpha-1 antitrypsin antibody,  ,Sed. Rate and serum Ferritin He received Hep B vaccination in 1994. have negative or normal.he denies abdominal pain, pruritis or fatigue. He has good apetite and has not lost any weight. He states the only thing that bothers him is recurrent burning itching involving perianal region. It started about three years ago; he had skin biopsy by Dr. Leticia Penna in [id number]  And it was normal; he was treated with diflucan but without relief. He denies rectal discharge or bleeding.  He is up to date on his Colonoscopy; last one was in 2008 and next one would be in 2013. Current medications; Current Outpatient Prescriptions on File Prior to Visit  Medication Sig Dispense Refill  . aspirin 81 MG tablet Take 81 mg by mouth daily.        . cimetidine (TAGAMET) 200 MG tablet Take 200 mg by mouth as needed.        . diphenhydrAMINE (SOMINEX) 25 MG tablet Take 25 mg by mouth at bedtime as needed.        . fish oil-omega-3 fatty acids 1000 MG capsule Take 1 g by mouth daily.       Marland Kitchen ibuprofen (ADVIL,MOTRIN) 100 MG tablet Take 100 mg by mouth every 6 (six) hours as needed.        . loratadine (CLARITIN) 10 MG tablet Take 10 mg by mouth as needed.       . magnesium oxide (MAG-OX) 400 MG tablet Take 400 mg by mouth daily.        . multivitamin (THERAGRAN) per tablet Take 1 tablet by mouth  daily.        . Tamsulosin HCl (FLOMAX) 0.4 MG CAPS Take by mouth.        Objective; BP 110/70  Pulse 66  Temp(Src) 99 F (37.2 C) (Oral)  Resp 12  Ht 5\' 6"  (1.676 m)  General:   Alert,  Well-developed, well-nourished, pleasant and cooperative in NAD Eyes:  Sclera clear, no icterus.   Conjunctiva pink. Mouth:  Oropharyngeal mucosa is normal. Neck:  Supple; no masses or thyromegaly. Heart:  Regular rate and rhythm; no murmurs, clicks, rubs,  or gallops. Lungs:  Clear to auscultation. Abdomen:  Soft, symmetrical, nontender and nondistended. No masses, hepatosplenomegaly or hernias noted. Normal bowel sounds, without guarding, and without rebound. Rectal examination reveals erythema to perianal skin anterior to anal opening.   Extremities:  Without clubbing or edema. Lab data; From 12/24/2010. Bili 0.5, AP 71, AST 31, ALT 47, Albumin 4.4. Assessment; #1. Mildly abnormal Transaminases with negative work-up; His AST and ALT are now normal; suspect he may have fatty liver not picked up on imaging studies. There is nothing in exam to suggest progressive liver disease. It remains to be seen if this trend continues. #1. Pruritis Ani; he has had this symptom for few years which makes it difficult to treat. Recommendations; LFTs in 3 months.  Mycolog-2 cream to be applied to perianal area bid for 2 weeks and then prn; he will call us with progress report in few weeks.

## 2011-01-13 LAB — FUNGUS CULTURE W SMEAR: Fungal Smear: NONE SEEN

## 2011-01-13 LAB — TISSUE CULTURE: Gram Stain: NONE SEEN

## 2011-03-01 ENCOUNTER — Telehealth: Payer: Self-pay | Admitting: Oncology

## 2011-03-01 NOTE — Telephone Encounter (Signed)
Called pt , left message, reminded pt of lab appt on 11/19, asked pt to call us to r/s MD visit that was cancelled due to [facility]

## 2011-03-08 ENCOUNTER — Other Ambulatory Visit (HOSPITAL_BASED_OUTPATIENT_CLINIC_OR_DEPARTMENT_OTHER): Payer: BC Managed Care – PPO | Admitting: Lab

## 2011-03-08 ENCOUNTER — Other Ambulatory Visit: Payer: Self-pay | Admitting: Oncology

## 2011-03-08 DIAGNOSIS — C9140 Hairy cell leukemia not having achieved remission: Secondary | ICD-10-CM

## 2011-03-08 LAB — CBC WITH DIFFERENTIAL/PLATELET
BASO%: 0.3 % (ref 0.0–2.0)
Eosinophils Absolute: 0 10*3/uL (ref 0.0–0.5)
LYMPH%: 26.6 % (ref 14.0–49.0)
MCH: 31.6 pg (ref 27.2–33.4)
MCHC: 34.1 g/dL (ref 32.0–36.0)
MCV: 92.8 fL (ref 79.3–98.0)
MONO%: 7.5 % (ref 0.0–14.0)
Platelets: 180 10*3/uL (ref 140–400)
RBC: 4.51 10*6/uL (ref 4.20–5.82)

## 2011-03-08 LAB — MORPHOLOGY: RBC Comments: NORMAL

## 2011-03-08 LAB — COMPREHENSIVE METABOLIC PANEL
Albumin: 4.2 g/dL (ref 3.5–5.2)
Alkaline Phosphatase: 72 U/L (ref 39–117)
BUN: 16 mg/dL (ref 6–23)
Glucose, Bld: 73 mg/dL (ref 70–99)
Potassium: 4.3 mEq/L (ref 3.5–5.3)

## 2011-03-10 ENCOUNTER — Telehealth: Payer: Self-pay | Admitting: *Deleted

## 2011-03-10 NOTE — Telephone Encounter (Signed)
VM left on pt's ans machine that labs done 03/08/11 with report that labs good per Dr. Cyndie Chime & these labs were faxed electronically to Dr. Carylon Perches.

## 2011-04-01 ENCOUNTER — Encounter (INDEPENDENT_AMBULATORY_CARE_PROVIDER_SITE_OTHER): Payer: Self-pay | Admitting: *Deleted

## 2011-04-12 ENCOUNTER — Other Ambulatory Visit (INDEPENDENT_AMBULATORY_CARE_PROVIDER_SITE_OTHER): Payer: Self-pay | Admitting: Internal Medicine

## 2011-04-12 LAB — HEPATIC FUNCTION PANEL
AST: 36 U/L (ref 0–37)
Bilirubin, Direct: 0.2 mg/dL (ref 0.0–0.3)
Total Bilirubin: 0.8 mg/dL (ref 0.3–1.2)

## 2011-04-22 ENCOUNTER — Telehealth (INDEPENDENT_AMBULATORY_CARE_PROVIDER_SITE_OTHER): Payer: Self-pay | Admitting: *Deleted

## 2011-04-22 NOTE — Telephone Encounter (Signed)
Per NUR the patient will need LFT in 6 months, unless he has had it done at another Physician's office.

## 2011-05-03 ENCOUNTER — Ambulatory Visit (HOSPITAL_BASED_OUTPATIENT_CLINIC_OR_DEPARTMENT_OTHER): Payer: BC Managed Care – PPO | Admitting: Oncology

## 2011-05-03 ENCOUNTER — Encounter: Payer: Self-pay | Admitting: Oncology

## 2011-05-03 VITALS — BP 129/75 | HR 64 | Temp 97.4°F | Wt 182.7 lb

## 2011-05-03 DIAGNOSIS — N400 Enlarged prostate without lower urinary tract symptoms: Secondary | ICD-10-CM

## 2011-05-03 DIAGNOSIS — K589 Irritable bowel syndrome without diarrhea: Secondary | ICD-10-CM

## 2011-05-03 DIAGNOSIS — R7989 Other specified abnormal findings of blood chemistry: Secondary | ICD-10-CM

## 2011-05-03 DIAGNOSIS — C9141 Hairy cell leukemia, in remission: Secondary | ICD-10-CM

## 2011-05-03 DIAGNOSIS — R945 Abnormal results of liver function studies: Secondary | ICD-10-CM

## 2011-05-03 DIAGNOSIS — C9140 Hairy cell leukemia not having achieved remission: Secondary | ICD-10-CM

## 2011-05-03 HISTORY — DX: Abnormal results of liver function studies: R94.5

## 2011-05-03 HISTORY — DX: Irritable bowel syndrome, unspecified: K58.9

## 2011-05-03 HISTORY — DX: Benign prostatic hyperplasia without lower urinary tract symptoms: N40.0

## 2011-05-03 HISTORY — DX: Other specified abnormal findings of blood chemistry: R79.89

## 2011-05-03 HISTORY — DX: Hairy cell leukemia, in remission: C91.41

## 2011-05-03 NOTE — Progress Notes (Signed)
Hematology and Oncology Follow Up Visit  Cameron Wu [id number] [birth year] 60 y.o. 05/03/2011 11:49 AM   Principle Diagnosis: Encounter Diagnoses  Name Primary?  . Hairy cell leukemia, in remission Yes  . IBS (irritable bowel syndrome)   . BPH (benign prostatic hypertrophy)   . Abnormal liver function tests      Interim History:   It is hard to believe that Cameron Wu is now 61 years old. He is in his third remission from hairy cell leukemia initially diagnosed in January 1992 he presented with constitutional symptoms and massive splenomegaly. He was treated with cladribine by a special exception [facility] before the drug was officially FDA approved. He had his first progression in July of 1995. He again achieved a complete response with another course of cladribine. He had a mother progression in June 2007. He was treated with a combination of cladribine plus Rituxan. He has remained in remission since that time. Most recent CBC done in this office on 03/08/11 with hemoglobin 14 hematocrit 42 white count 5200 with 65% neutrophils 27% lymphocytes 8 monocytes and platelet count 180,000., Review of the peripheral blood, I did not detect any abnormal lymphocytes. He has had no significant interim medical problems. He was put on Flomax by his urologist and thishas improved his urinary symptoms significantly.  He is seeing Dr. Dionicia Abler in consultation for persistent fluctuating mild liver function abnormalities. No additional testing ordered at this time. He got his flu vaccine this season. He has successfully stop smoking since 2007.  Medications: reviewed  Allergies:  Allergies  Allergen Reactions  . Penicillins     Review of Systems: Constitutional:   None Respiratory: None Cardiovascular:  Negative Gastrointestinal: Negative Genito-Urinary: See above Musculoskeletal: Negative Neurologic: Negative Skin: Not questioned Remaining ROS  negative.  Physical Exam: Blood pressure 129/75, pulse 64, temperature 97.4 F (36.3 C), weight 182 lb 11.2 oz (82.872 kg). Wt Readings from Last 3 Encounters:  05/03/11 182 lb 11.2 oz (82.872 kg)  12/14/10 184 lb 11.2 oz (83.779 kg)     General appearance: Thin Caucasian man HENNT: Normal pharynx no erythema or exudate  Lymph nodes: No cervical supraclavicular axillary or inguinal adenopathy Breasts: Lungs: Clear to auscultation resonant to percussion Heart: Regular cardiac rhythm no murmur Abdomen: Soft nontender no mass no organomegaly Extremities: No edema no calf tenderness Vascular: No cyanosis Neurologic: No focal deficits Skin: No rash or ecchymosis  Lab Results: Lab Results  Component Value Date   WBC 5.2 03/08/2011   HGB 14.3 03/08/2011   HCT 41.8 03/08/2011   MCV 92.8 03/08/2011   PLT 180 03/08/2011     Chemistry      Component Value Date/Time   NA 143 03/08/2011 1057   NA 143 03/08/2011 1057   NA 141 10/05/2010   K 4.3 03/08/2011 1057   K 4.3 03/08/2011 1057   CL 105 03/08/2011 1057   CL 105 03/08/2011 1057   CO2 31 03/08/2011 1057   CO2 31 03/08/2011 1057   BUN 16 03/08/2011 1057   BUN 16 03/08/2011 1057   CREATININE 1.02 03/08/2011 1057   CREATININE 1.02 03/08/2011 1057   GLU 103 10/05/2010      Component Value Date/Time   CALCIUM 9.2 03/08/2011 1057   CALCIUM 9.2 03/08/2011 1057   ALKPHOS 76 04/12/2011 0825   AST 36 04/12/2011 0825   ALT 50 04/12/2011 0825   BILITOT 0.8 04/12/2011 0825       Radiological Studies:  Impression and Plan: #1. Hairy cell leukemia in third remission. Plan continue every 6 month blood work an annual visits. We continue to make progress in this field and in the eventuality of a relapse we do have new and exciting treatments to offer him.  #2. Irritable bowel syndrome. Currently not active.  #3. BPH symptoms controlled on Flomax.  #4. Fluctuating mild liver function abnormalities with negative evaluation to  date.   CC:. Dr. Carylon Perches; Dr. Dennie Maizes; Dr Elaina Pattee   Levert Feinstein, MD 1/14/201311:49 AM

## 2011-07-21 ENCOUNTER — Encounter (INDEPENDENT_AMBULATORY_CARE_PROVIDER_SITE_OTHER): Payer: Self-pay | Admitting: *Deleted

## 2011-07-29 ENCOUNTER — Other Ambulatory Visit (INDEPENDENT_AMBULATORY_CARE_PROVIDER_SITE_OTHER): Payer: Self-pay | Admitting: *Deleted

## 2011-07-29 ENCOUNTER — Telehealth (INDEPENDENT_AMBULATORY_CARE_PROVIDER_SITE_OTHER): Payer: Self-pay | Admitting: *Deleted

## 2011-07-29 DIAGNOSIS — Z86010 Personal history of colonic polyps: Secondary | ICD-10-CM

## 2011-07-29 NOTE — Telephone Encounter (Signed)
Patient needs movi prep 

## 2011-07-30 MED ORDER — PEG-KCL-NACL-NASULF-NA ASC-C 100 G PO SOLR: 1.0000 | Freq: Once | ORAL | Status: DC

## 2011-08-17 ENCOUNTER — Encounter (INDEPENDENT_AMBULATORY_CARE_PROVIDER_SITE_OTHER): Payer: Self-pay

## 2011-09-01 ENCOUNTER — Telehealth (INDEPENDENT_AMBULATORY_CARE_PROVIDER_SITE_OTHER): Payer: Self-pay | Admitting: *Deleted

## 2011-09-01 NOTE — Telephone Encounter (Signed)
PCP/Requesting MD: fagan  Name & DOB: Cameron Wu 12/27/2050    Procedure: ycs  Reason/Indication:  Hx polyps  Has patient had this procedure before?  yes  If so, when, by whom and where?  2008  Is there a family history of colon cancer?  no  Who?  What age when diagnosed?    Is patient diabetic?   no      Does patient have prosthetic heart valve?  no  Do you have a pacemaker?  no  Has patient had joint replacement within last 12 months?  no  Is patient on Coumadin, Plavix and/or Aspirin? yes  Medications: asa 81 mg daily, fish oil 1000 mg daily, magnesium oxide 400 mg daily, multi vit, finasteride .5 mg daily, tamsulosin .4 mg daily, allergy relief antihistamine daily, ibuprofen 200 mg prn, acid reducer 200 mg prn, antidiarrhea 2 mg prn, otc sleep aid 50 mg prn  Allergies: pcn  Medication Adjustment: asa 2 days  Procedure date & time: 09/30/11 @ 730

## 2011-09-07 ENCOUNTER — Telehealth: Payer: Self-pay

## 2011-09-07 ENCOUNTER — Other Ambulatory Visit (HOSPITAL_BASED_OUTPATIENT_CLINIC_OR_DEPARTMENT_OTHER): Payer: BC Managed Care – PPO | Admitting: Lab

## 2011-09-07 DIAGNOSIS — R945 Abnormal results of liver function studies: Secondary | ICD-10-CM

## 2011-09-07 DIAGNOSIS — C9141 Hairy cell leukemia, in remission: Secondary | ICD-10-CM

## 2011-09-07 DIAGNOSIS — C9140 Hairy cell leukemia not having achieved remission: Secondary | ICD-10-CM

## 2011-09-07 LAB — CBC WITH DIFFERENTIAL/PLATELET
BASO%: 0.4 % (ref 0.0–2.0)
EOS%: 1.1 % (ref 0.0–7.0)
MCH: 31.8 pg (ref 27.2–33.4)
MCV: 92.6 fL (ref 79.3–98.0)
MONO%: 6.9 % (ref 0.0–14.0)
RBC: 4.54 10*6/uL (ref 4.20–5.82)
RDW: 14.2 % (ref 11.0–14.6)
lymph#: 1.7 10*3/uL (ref 0.9–3.3)
nRBC: 0 % (ref 0–0)

## 2011-09-07 LAB — MORPHOLOGY

## 2011-09-07 NOTE — Telephone Encounter (Signed)
Message copied by Albertha Ghee on Tue Sep 07, 2011  2:00 PM ------      Message from: Levert Feinstein      Created: Tue Sep 07, 2011  1:39 PM       Call pt - CBC stable/normal

## 2011-09-07 NOTE — Telephone Encounter (Signed)
agree

## 2011-09-07 NOTE — Telephone Encounter (Signed)
Message left on pt's VM with result note per Dr Cyndie Chime. dph

## 2011-09-09 ENCOUNTER — Telehealth: Payer: Self-pay | Admitting: *Deleted

## 2011-09-09 NOTE — Telephone Encounter (Signed)
Message copied by Sabino Snipes on Thu Sep 09, 2011  2:04 PM ------      Message from: Levert Feinstein      Created: Tue Sep 07, 2011  1:39 PM       Call pt - CBC stable/normal

## 2011-09-09 NOTE — Telephone Encounter (Signed)
Pt notified of lab results per Dr Granfortuna. 

## 2011-09-24 ENCOUNTER — Encounter (HOSPITAL_COMMUNITY): Payer: Self-pay | Admitting: Pharmacy Technician

## 2011-09-29 MED ORDER — SODIUM CHLORIDE 0.45 % IV SOLN
Freq: Once | INTRAVENOUS | Status: AC
  Administered 2011-09-30: 1000 mL via INTRAVENOUS

## 2011-09-30 ENCOUNTER — Ambulatory Visit (HOSPITAL_COMMUNITY)
Admission: RE | Admit: 2011-09-30 | Discharge: 2011-09-30 | Disposition: A | Discharge status: Home / Self Care | Payer: BC Managed Care – PPO | Source: Ambulatory Visit | Attending: Internal Medicine | Admitting: Internal Medicine

## 2011-09-30 ENCOUNTER — Encounter (HOSPITAL_COMMUNITY)
Admission: RE | Disposition: A | Discharge status: Home / Self Care | Payer: Self-pay | Source: Ambulatory Visit | Attending: Internal Medicine

## 2011-09-30 ENCOUNTER — Encounter (HOSPITAL_COMMUNITY): Payer: Self-pay | Admitting: *Deleted

## 2011-09-30 DIAGNOSIS — K5730 Diverticulosis of large intestine without perforation or abscess without bleeding: Secondary | ICD-10-CM | POA: Insufficient documentation

## 2011-09-30 DIAGNOSIS — K644 Residual hemorrhoidal skin tags: Secondary | ICD-10-CM

## 2011-09-30 DIAGNOSIS — Z860100 Personal history of colon polyps, unspecified: Secondary | ICD-10-CM | POA: Insufficient documentation

## 2011-09-30 DIAGNOSIS — Z09 Encounter for follow-up examination after completed treatment for conditions other than malignant neoplasm: Secondary | ICD-10-CM | POA: Insufficient documentation

## 2011-09-30 DIAGNOSIS — Z856 Personal history of leukemia: Secondary | ICD-10-CM | POA: Insufficient documentation

## 2011-09-30 HISTORY — DX: Gastro-esophageal reflux disease without esophagitis: K21.9

## 2011-09-30 HISTORY — PX: COLONOSCOPY: SHX5424

## 2011-09-30 SURGERY — COLONOSCOPY
Anesthesia: Moderate Sedation

## 2011-09-30 MED ORDER — MEPERIDINE HCL 50 MG/ML IJ SOLN
INTRAMUSCULAR | Status: DC | PRN
Start: 2011-09-30 — End: 2011-09-30
  Administered 2011-09-30 (×2): 25 mg via INTRAVENOUS

## 2011-09-30 MED ORDER — LIDOCAINE HCL 2 % EX GEL
CUTANEOUS | Status: AC
  Filled 2011-09-30: qty 30

## 2011-09-30 MED ORDER — MIDAZOLAM HCL 5 MG/5ML IJ SOLN
INTRAMUSCULAR | Status: DC | PRN
  Administered 2011-09-30 (×2): 2 mg via INTRAVENOUS

## 2011-09-30 MED ORDER — MIDAZOLAM HCL 5 MG/5ML IJ SOLN
INTRAMUSCULAR | Status: AC
  Filled 2011-09-30: qty 10

## 2011-09-30 MED ORDER — LIDOCAINE HCL 2 % EX GEL
CUTANEOUS | Status: DC | PRN
  Administered 2011-09-30: 1 via TOPICAL

## 2011-09-30 MED ORDER — MEPERIDINE HCL 50 MG/ML IJ SOLN
INTRAMUSCULAR | Status: AC
  Filled 2011-09-30: qty 1

## 2011-09-30 MED ORDER — SIMETHICONE 40 MG/0.6ML PO SUSP
ORAL | Status: DC | PRN
  Administered 2011-09-30: 08:00:00

## 2011-09-30 NOTE — H&P (Signed)
Cameron Wu is an 61 y.o. male.   Chief Complaint: Patient is here for colonoscopy. HPI: Patient is 13-year-old Caucasian male who has history of colonic adenomas and is here for surveillance colonoscopy. His last exam was in March 2008. He denies abdominal pain change in his bowel habits or rectal bleeding. Family history significant for multiple colonic polyps in his father but no history of CRC.  Past Medical History  Diagnosis Date  . Hairy cell leukemia   . Elevated transaminase level   . Hairy cell leukemia     1992  . Irritable bowel syndrome     2 yrs ago, states he had inflammation around anus. Biopsy: inclusive  . Hairy cell leukemia, in remission 05/03/2011  . IBS (irritable bowel syndrome) 05/03/2011  . BPH (benign prostatic hypertrophy) 05/03/2011  . Abnormal liver function tests 05/03/2011  . GERD (gastroesophageal reflux disease)     Past Surgical History  Procedure Date  . Hx colon polyps   . Colonoscopy 08    with polyps  . Bone marrow biopsy     2007  . Tonsillectomy     childhood    History reviewed. No pertinent family history. Social History:  reports that he quit smoking about 5 years ago. His smoking use included Cigarettes. He has never used smokeless tobacco. He reports that he drinks about 2.4 ounces of alcohol per week. He reports that he does not use illicit drugs.  Allergies:  Allergies  Allergen Reactions  . Penicillins Swelling    Medications Prior to Admission  Medication Sig Dispense Refill  . aspirin EC 81 MG tablet Take 81 mg by mouth every morning.      . cimetidine (TAGAMET) 200 MG tablet Take 200 mg by mouth daily as needed. For acid reflux      . diphenhydrAMINE (BENADRYL) 25 MG tablet Take 50 mg by mouth at bedtime.      . finasteride (PROSCAR) 5 MG tablet Take 5 mg by mouth every morning.      . fish oil-omega-3 fatty acids 1000 MG capsule Take 1 g by mouth daily at 12 noon.       Marland Kitchen ibuprofen (ADVIL,MOTRIN) 200 MG tablet Take 400  mg by mouth every 6 (six) hours as needed. For pain      . loperamide (IMODIUM) 2 MG capsule Take 2 mg by mouth as needed.       . loratadine (CLARITIN) 10 MG tablet Take 10 mg by mouth daily as needed. For allergies      . magnesium oxide (MAG-OX) 400 MG tablet Take 400 mg by mouth at bedtime.       . multivitamin (THERAGRAN) per tablet Take 1 tablet by mouth every morning.       . peg 3350 powder (MOVIPREP) SOLR Take 1 kit (100 g total) by mouth once.  1 kit  0  . Tamsulosin HCl (FLOMAX) 0.4 MG CAPS Take 0.4 mg by mouth at bedtime.         No results found for this or any previous visit (from the past 48 hour(s)). No results found.  ROS  Blood pressure 133/81, pulse 66, temperature 97.7 F (36.5 C), temperature source Oral, resp. rate 18, height 6' (1.829 m), weight 184 lb (83.462 kg), SpO2 98.00%. Physical Exam   Assessment/Plan History of colonic polyps. Surveillance colonoscopy.  Cameron Wu U 09/30/2011, 7:30 AM

## 2011-09-30 NOTE — Op Note (Signed)
COLONOSCOPY PROCEDURE REPORT  PATIENT:  Cameron Wu  MR#:  [id number] Birthdate:  [birth year], 61 y.o., male Endoscopist:  Dr. Malissa Hippo, MD Referred By:  Dr. Carylon Perches, MD Procedure Date: 09/30/2011  Procedure:   Colonoscopy  Indications: Patient is 61 year old Caucasian male with history of colonic adenomas.  Informed Consent:  The procedure and risks were reviewed with the patient and informed consent was obtained.  Medications:  Demerol 50 mg IV Versed 4 mg IV  Description of procedure:  After a digital rectal exam was performed, that colonoscope was advanced from the anus through the rectum and colon to the area of the cecum, ileocecal valve and appendiceal orifice. The cecum was deeply intubated. These structures were well-seen and photographed for the record. From the level of the cecum and ileocecal valve, the scope was slowly and cautiously withdrawn. The mucosal surfaces were carefully surveyed utilizing scope tip to flexion to facilitate fold flattening as needed. The scope was pulled down into the rectum where a thorough exam including retroflexion was performed. Terminal ileum was also examined.  Findings:   Normal terminal ileum. Scattered diverticula at sigmoid and transverse colon. No evidence of colonic polyps or other mucosal abnormalities. Normal rectal mucosa. Small hemorrhoids below the dentate line.  Therapeutic/Diagnostic Maneuvers Performed:  None  Complications:  None  Cecal Withdrawal Time:  9 minutes  Impression:  Normal terminal ileum. Scattered diverticula at sigmoid and transverse colon. Small external hemorrhoids. No evidence of recurrent polyps.   Recommendations:  Standard instructions given. Next colonoscopy in 7 years.  Cameron Wu  09/30/2011 7:55 AM  CC: Dr. Carylon Perches, MD & Dr. Bonnetta Barry ref. provider found

## 2011-09-30 NOTE — Discharge Instructions (Signed)
Resume usual medications and high fiber diet. No driving for 24 hours. Next colonoscopy in 7 years.  High Fiber Diet A high fiber diet changes your normal diet to include more whole grains, legumes, fruits, and vegetables. Changes in the diet involve replacing refined carbohydrates with unrefined foods. The calorie level of the diet is essentially unchanged. The Dietary Reference Intake (recommended amount) for adult males is 38 g per day. For adult females, it is 25 g per day. Pregnant and lactating women should consume 28 g of fiber per day. Fiber is the intact part of a plant that is not broken down during digestion. Functional fiber is fiber that has been isolated from the plant to provide a beneficial effect in the body. PURPOSE  Increase stool bulk.   Ease and regulate bowel movements.   Lower cholesterol.  INDICATIONS THAT YOU NEED MORE FIBER  Constipation and hemorrhoids.   Uncomplicated diverticulosis (intestine condition) and irritable bowel syndrome.   Weight management.   As a protective measure against hardening of the arteries (atherosclerosis), diabetes, and cancer.  NOTE OF CAUTION If you have a digestive or bowel problem, ask your caregiver for advice before adding high fiber foods to your diet. Some of the following medical problems are such that a high fiber diet should not be used without consulting your caregiver:  Acute diverticulitis (intestine infection).   Partial small bowel obstructions.   Complicated diverticular disease involving bleeding, rupture (perforation), or abscess (boil, furuncle).   Presence of autonomic neuropathy (nerve damage) or gastric paresis (stomach cannot empty itself).  GUIDELINES FOR INCREASING FIBER  Start adding fiber to the diet slowly. A gradual increase of about 5 more grams (2 slices of whole-wheat bread, 2 servings of most fruits or vegetables, or 1 bowl of high fiber cereal) per day is best. Too rapid an increase in fiber  may result in constipation, flatulence, and bloating.   Drink enough water and fluids to keep your urine clear or pale yellow. Water, juice, or caffeine-free drinks are recommended. Not drinking enough fluid may cause constipation.   Eat a variety of high fiber foods rather than one type of fiber.   Try to increase your intake of fiber through using high fiber foods rather than fiber pills or supplements that contain small amounts of fiber.   The goal is to change the types of food eaten. Do not supplement your present diet with high fiber foods, but replace foods in your present diet.  INCLUDE A VARIETY OF FIBER SOURCES  Replace refined and processed grains with whole grains, canned fruits with fresh fruits, and incorporate other fiber sources. White rice, white breads, and most bakery goods contain little or no fiber.   Brown whole-grain rice, buckwheat oats, and many fruits and vegetables are all good sources of fiber. These include: broccoli, Brussels sprouts, cabbage, cauliflower, beets, sweet potatoes, white potatoes (skin on), carrots, tomatoes, eggplant, squash, berries, fresh fruits, and dried fruits.   Cereals appear to be the richest source of fiber. Cereal fiber is found in whole grains and bran. Bran is the fiber-rich outer coat of cereal grain, which is largely removed in refining. In whole-grain cereals, the bran remains. In breakfast cereals, the largest amount of fiber is found in those with "bran" in their names. The fiber content is sometimes indicated on the label.   You may need to include additional fruits and vegetables each day.   In baking, for 1 cup white flour, you may use the following  substitutions:   1 cup whole-wheat flour minus 2 tbs.    cup white flour plus  cup whole-wheat flour.  Document Released: 04/05/2005 Document Revised: 03/25/2011 Document Reviewed: 02/11/2009 [facility] Patient Information 2012 [place], [place].

## 2011-10-06 ENCOUNTER — Encounter (HOSPITAL_COMMUNITY): Payer: Self-pay | Admitting: Internal Medicine

## 2011-10-14 ENCOUNTER — Other Ambulatory Visit (INDEPENDENT_AMBULATORY_CARE_PROVIDER_SITE_OTHER): Payer: Self-pay | Admitting: *Deleted

## 2011-10-14 ENCOUNTER — Encounter (INDEPENDENT_AMBULATORY_CARE_PROVIDER_SITE_OTHER): Payer: Self-pay | Admitting: *Deleted

## 2011-11-05 ENCOUNTER — Encounter (INDEPENDENT_AMBULATORY_CARE_PROVIDER_SITE_OTHER): Payer: Self-pay

## 2012-02-28 ENCOUNTER — Telehealth: Payer: Self-pay | Admitting: Oncology

## 2012-02-28 NOTE — Telephone Encounter (Signed)
Called patient left message regarding lab and Md r/s from 11/19 to 11/18 due to MD call day

## 2012-03-01 ENCOUNTER — Telehealth: Payer: Self-pay | Admitting: Oncology

## 2012-03-01 NOTE — Telephone Encounter (Signed)
Pt is aware of appt for 11/18th lab and MD

## 2012-03-06 ENCOUNTER — Other Ambulatory Visit (HOSPITAL_BASED_OUTPATIENT_CLINIC_OR_DEPARTMENT_OTHER): Payer: BC Managed Care – PPO | Admitting: Lab

## 2012-03-06 ENCOUNTER — Telehealth: Payer: Self-pay | Admitting: Oncology

## 2012-03-06 ENCOUNTER — Ambulatory Visit (HOSPITAL_BASED_OUTPATIENT_CLINIC_OR_DEPARTMENT_OTHER): Payer: BC Managed Care – PPO | Admitting: Oncology

## 2012-03-06 VITALS — BP 136/78 | HR 67 | Temp 98.2°F | Resp 20 | Ht 72.0 in | Wt 183.3 lb

## 2012-03-06 DIAGNOSIS — C9141 Hairy cell leukemia, in remission: Secondary | ICD-10-CM

## 2012-03-06 DIAGNOSIS — R945 Abnormal results of liver function studies: Secondary | ICD-10-CM

## 2012-03-06 DIAGNOSIS — C9140 Hairy cell leukemia not having achieved remission: Secondary | ICD-10-CM

## 2012-03-06 DIAGNOSIS — R7989 Other specified abnormal findings of blood chemistry: Secondary | ICD-10-CM

## 2012-03-06 DIAGNOSIS — K589 Irritable bowel syndrome without diarrhea: Secondary | ICD-10-CM

## 2012-03-06 LAB — COMPREHENSIVE METABOLIC PANEL (CC13)
ALT: 42 U/L (ref 0–55)
BUN: 20 mg/dL (ref 7.0–26.0)
CO2: 29 mEq/L (ref 22–29)
Calcium: 9.6 mg/dL (ref 8.4–10.4)
Chloride: 107 mEq/L (ref 98–107)
Creatinine: 1.1 mg/dL (ref 0.7–1.3)

## 2012-03-06 LAB — CBC WITH DIFFERENTIAL/PLATELET
BASO%: 0.4 % (ref 0.0–2.0)
Basophils Absolute: 0 10*3/uL (ref 0.0–0.1)
HCT: 42.7 % (ref 38.4–49.9)
HGB: 14.7 g/dL (ref 13.0–17.1)
MONO#: 0.5 10*3/uL (ref 0.1–0.9)
NEUT#: 4.1 10*3/uL (ref 1.5–6.5)
NEUT%: 64.8 % (ref 39.0–75.0)
WBC: 6.4 10*3/uL (ref 4.0–10.3)
lymph#: 1.7 10*3/uL (ref 0.9–3.3)

## 2012-03-06 LAB — LACTATE DEHYDROGENASE (CC13): LDH: 171 U/L (ref 125–245)

## 2012-03-06 LAB — CHCC SMEAR

## 2012-03-06 NOTE — Patient Instructions (Signed)
Lab every 4 months next 07/10/12 Visit with Dr Reece Agar 1 year  03/12/13

## 2012-03-06 NOTE — Telephone Encounter (Signed)
Gave patient lab and MD visit for 6 months starting March 2014

## 2012-03-07 ENCOUNTER — Other Ambulatory Visit: Payer: BC Managed Care – PPO | Admitting: Lab

## 2012-03-07 ENCOUNTER — Ambulatory Visit: Payer: BC Managed Care – PPO | Admitting: Oncology

## 2012-03-07 NOTE — Progress Notes (Signed)
Hematology and Oncology Follow Up Visit  Cameron Wu [id number] February 04, 1951 61 y.o. 03/07/2012 9:43 AM   Principle Diagnosis: Encounter Diagnosis  Name Primary?  . Hairy cell leukemia, in remission Yes     Interim History:   Followup visit for this pleasant 61 year old man. He is in his third remission from hairy cell leukemia initially diagnosed in January 1992 he presented with constitutional symptoms and massive splenomegaly. He was treated with cladribine by [facility] before the drug was officially FDA approved. He had his first progression in July of 1995. He again achieved a complete response with another course of cladribine. He had another progression in June 2007. He was treated with a combination of cladribine plus Rituxan. He has remained in remission since that time.  He has had no interim medical problems. He had a routine colonoscopy June 13. He reports some mild diverticular changes but no polyps. He did have some problems with intermittent diarrhea and low-grade liver dysfunction in the past. His symptoms have resolved. Most recent liver panel done through our office is normal.   Medications: reviewed  Allergies:  Allergies  Allergen Reactions  . Penicillins Swelling    Review of Systems: Constitutional:   No constitutional symptoms Respiratory: No cough or dyspnea Cardiovascular:  No chest pain or palpitations Gastrointestinal: No abdominal pain no change in bowel habit Genito-Urinary: No urinary tract symptoms Musculoskeletal: No muscle or bone pain Neurologic: No headache or change in vision Skin: No rash or ecchymosis Remaining ROS negative.  Physical Exam: Blood pressure 136/78, pulse 67, temperature 98.2 F (36.8 C), temperature source Oral, resp. rate 20, height 6' (1.829 m), weight 183 lb 4.8 oz (83.144 kg). Wt Readings from Last 3 Encounters:  03/06/12 183 lb 4.8 oz (83.144 kg)  09/30/11 184 lb  (83.462 kg)  09/30/11 184 lb (83.462 kg)     General appearance: Thin, adequately nourished Caucasian man HENNT: Pharynx no erythema or exudate Lymph nodes: No cervical, supraclavicular, or axillary adenopathy Breasts: Lungs: Clear to auscultation resonant to percussion Heart: Regular rhythm no murmur Abdomen: Soft, nontender, no mass, no organomegaly Extremities: No edema, no calf tenderness Vascular: No cyanosis Neurologic: No focal deficit Skin: No rash or ecchymosis  Lab Results: Lab Results: White count differential: 65% neutrophils, 26% lymphocytes, 8% monocytes   Component Value Date   WBC 6.4 03/06/2012   HGB 14.7 03/06/2012   HCT 42.7 03/06/2012   MCV 93.4 03/06/2012   PLT 165 03/06/2012     Chemistry      Component Value Date/Time   NA 142 03/06/2012 1439   NA 143 03/08/2011 1057   NA 143 03/08/2011 1057   NA 141 10/05/2010   K 4.2 03/06/2012 1439   K 4.3 03/08/2011 1057   K 4.3 03/08/2011 1057   CL 107 03/06/2012 1439   CL 105 03/08/2011 1057   CL 105 03/08/2011 1057   CO2 29 03/06/2012 1439   CO2 31 03/08/2011 1057   CO2 31 03/08/2011 1057   BUN 20.0 03/06/2012 1439   BUN 16 03/08/2011 1057   BUN 16 03/08/2011 1057   CREATININE 1.1 03/06/2012 1439   CREATININE 1.02 03/08/2011 1057   CREATININE 1.02 03/08/2011 1057   GLU 103 10/05/2010      Component Value Date/Time   CALCIUM 9.6 03/06/2012 1439   CALCIUM 9.2 03/08/2011 1057   CALCIUM 9.2 03/08/2011 1057   ALKPHOS 83 03/06/2012 1439   ALKPHOS 76 04/12/2011 0825  AST 30 03/06/2012 1439   AST 36 04/12/2011 0825   ALT 42 03/06/2012 1439   ALT 50 04/12/2011 0825   BILITOT 0.45 03/06/2012 1439   BILITOT 0.8 04/12/2011 0825    Review of the peripheral blood film: Benign reactive lymphocytes. No obvious malignant lymphocytes   Impression and Plan: #1. Hairy cell leukemia in third remission.  Plan continue every 6 month blood work ann annual visits.    #2. Irritable bowel syndrome. Currently not  active. Recent normal colonoscopy.  #3. BPH symptoms controlled on Flomax.   #4. Fluctuating mild liver function abnormalities with negative evaluation to date.     CC:. Dr. Carylon Perches; Dr.N. Laury Deep, MD 11/19/20139:43 AM

## 2012-03-14 ENCOUNTER — Ambulatory Visit (INDEPENDENT_AMBULATORY_CARE_PROVIDER_SITE_OTHER): Payer: BC Managed Care – PPO | Admitting: Urology

## 2012-03-14 DIAGNOSIS — R972 Elevated prostate specific antigen [PSA]: Secondary | ICD-10-CM

## 2012-03-14 DIAGNOSIS — N400 Enlarged prostate without lower urinary tract symptoms: Secondary | ICD-10-CM

## 2012-03-14 DIAGNOSIS — N529 Male erectile dysfunction, unspecified: Secondary | ICD-10-CM

## 2012-04-07 ENCOUNTER — Ambulatory Visit: Payer: BC Managed Care – PPO | Admitting: Oncology

## 2012-06-03 ENCOUNTER — Other Ambulatory Visit: Payer: Self-pay

## 2012-07-10 ENCOUNTER — Other Ambulatory Visit (HOSPITAL_BASED_OUTPATIENT_CLINIC_OR_DEPARTMENT_OTHER): Payer: BC Managed Care – PPO | Admitting: Lab

## 2012-07-10 DIAGNOSIS — C9141 Hairy cell leukemia, in remission: Secondary | ICD-10-CM

## 2012-07-10 DIAGNOSIS — C9140 Hairy cell leukemia not having achieved remission: Secondary | ICD-10-CM

## 2012-07-10 LAB — CBC WITH DIFFERENTIAL/PLATELET
BASO%: 0.5 % (ref 0.0–2.0)
Basophils Absolute: 0 10*3/uL (ref 0.0–0.1)
EOS%: 1.2 % (ref 0.0–7.0)
HGB: 14.7 g/dL (ref 13.0–17.1)
MCH: 31.3 pg (ref 27.2–33.4)
MCHC: 33.9 g/dL (ref 32.0–36.0)
RBC: 4.7 10*6/uL (ref 4.20–5.82)
RDW: 14 % (ref 11.0–14.6)
lymph#: 1.7 10*3/uL (ref 0.9–3.3)

## 2012-07-10 LAB — MORPHOLOGY

## 2012-07-11 ENCOUNTER — Telehealth: Payer: Self-pay | Admitting: *Deleted

## 2012-07-11 NOTE — Telephone Encounter (Signed)
Called pt and relayed Dr. Timoteo Expose message below.  He verbalized understanding.

## 2012-07-11 NOTE — Telephone Encounter (Signed)
Message copied by Wende Mott on Tue Jul 11, 2012  4:54 PM ------      Message from: Orbie Hurst      Created: Tue Jul 11, 2012  4:45 PM                   ----- Message -----         From: Levert Feinstein, MD         Sent: 07/10/2012   6:50 PM           To: Orbie Hurst, RN, Sabino Snipes, RN, #            Call pt lab stable ------

## 2012-07-30 ENCOUNTER — Encounter: Payer: Self-pay | Admitting: Oncology

## 2012-09-12 ENCOUNTER — Ambulatory Visit (INDEPENDENT_AMBULATORY_CARE_PROVIDER_SITE_OTHER): Payer: BC Managed Care – PPO | Admitting: Urology

## 2012-09-12 DIAGNOSIS — N529 Male erectile dysfunction, unspecified: Secondary | ICD-10-CM

## 2012-09-12 DIAGNOSIS — N400 Enlarged prostate without lower urinary tract symptoms: Secondary | ICD-10-CM

## 2012-09-12 DIAGNOSIS — R972 Elevated prostate specific antigen [PSA]: Secondary | ICD-10-CM

## 2012-11-13 ENCOUNTER — Telehealth: Payer: Self-pay | Admitting: *Deleted

## 2012-11-13 ENCOUNTER — Other Ambulatory Visit (HOSPITAL_BASED_OUTPATIENT_CLINIC_OR_DEPARTMENT_OTHER): Payer: BC Managed Care – PPO

## 2012-11-13 DIAGNOSIS — C9140 Hairy cell leukemia not having achieved remission: Secondary | ICD-10-CM

## 2012-11-13 DIAGNOSIS — C9141 Hairy cell leukemia, in remission: Secondary | ICD-10-CM

## 2012-11-13 LAB — CBC WITH DIFFERENTIAL/PLATELET
BASO%: 0.5 % (ref 0.0–2.0)
LYMPH%: 23.6 % (ref 14.0–49.0)
MCH: 31.2 pg (ref 27.2–33.4)
MCHC: 34.3 g/dL (ref 32.0–36.0)
MCV: 91.2 fL (ref 79.3–98.0)
MONO%: 7.4 % (ref 0.0–14.0)
Platelets: 177 10*3/uL (ref 140–400)
RBC: 4.73 10*6/uL (ref 4.20–5.82)

## 2012-11-13 NOTE — Telephone Encounter (Signed)
Message copied by Gala Romney on Mon Nov 13, 2012  3:55 PM ------      Message from: Levert Feinstein      Created: Mon Nov 13, 2012  3:09 PM       Call pt  CBC remains normal ------

## 2012-11-13 NOTE — Telephone Encounter (Signed)
Left message notifying pt CBC remains normal and to check MyChart.  Any questions to call office.

## 2012-11-14 LAB — MORPHOLOGY: RBC Comments: NORMAL

## 2012-11-15 ENCOUNTER — Ambulatory Visit (HOSPITAL_COMMUNITY)
Admission: RE | Admit: 2012-11-15 | Discharge: 2012-11-15 | Disposition: A | Discharge status: Home / Self Care | Payer: BC Managed Care – PPO | Source: Ambulatory Visit | Attending: Internal Medicine | Admitting: Internal Medicine

## 2012-11-15 ENCOUNTER — Other Ambulatory Visit (HOSPITAL_COMMUNITY): Payer: Self-pay | Admitting: Internal Medicine

## 2012-11-15 ENCOUNTER — Encounter: Payer: Self-pay | Admitting: Oncology

## 2012-11-15 DIAGNOSIS — M25511 Pain in right shoulder: Secondary | ICD-10-CM

## 2012-11-15 DIAGNOSIS — M25519 Pain in unspecified shoulder: Secondary | ICD-10-CM | POA: Insufficient documentation

## 2013-01-07 ENCOUNTER — Encounter: Payer: Self-pay | Admitting: Oncology

## 2013-03-08 ENCOUNTER — Other Ambulatory Visit: Payer: Self-pay | Admitting: *Deleted

## 2013-03-08 DIAGNOSIS — C9141 Hairy cell leukemia, in remission: Secondary | ICD-10-CM

## 2013-03-12 ENCOUNTER — Ambulatory Visit (HOSPITAL_BASED_OUTPATIENT_CLINIC_OR_DEPARTMENT_OTHER): Payer: BC Managed Care – PPO | Admitting: Oncology

## 2013-03-12 ENCOUNTER — Other Ambulatory Visit (HOSPITAL_BASED_OUTPATIENT_CLINIC_OR_DEPARTMENT_OTHER): Payer: BC Managed Care – PPO | Admitting: Lab

## 2013-03-12 ENCOUNTER — Other Ambulatory Visit: Payer: BC Managed Care – PPO

## 2013-03-12 ENCOUNTER — Telehealth: Payer: Self-pay | Admitting: Oncology

## 2013-03-12 VITALS — BP 137/85 | HR 64 | Temp 99.8°F | Resp 18 | Ht 72.0 in | Wt 189.0 lb

## 2013-03-12 DIAGNOSIS — C9140 Hairy cell leukemia not having achieved remission: Secondary | ICD-10-CM

## 2013-03-12 DIAGNOSIS — C9141 Hairy cell leukemia, in remission: Secondary | ICD-10-CM

## 2013-03-12 LAB — MORPHOLOGY
PLT EST: ADEQUATE
RBC Comments: NORMAL

## 2013-03-12 LAB — CBC WITH DIFFERENTIAL/PLATELET
BASO%: 0.5 % (ref 0.0–2.0)
EOS%: 0.9 % (ref 0.0–7.0)
HCT: 41.8 % (ref 38.4–49.9)
HGB: 13.9 g/dL (ref 13.0–17.1)
MCHC: 33.2 g/dL (ref 32.0–36.0)
MONO#: 0.5 10*3/uL (ref 0.1–0.9)
NEUT%: 69.6 % (ref 39.0–75.0)
RDW: 14.3 % (ref 11.0–14.6)
WBC: 8 10*3/uL (ref 4.0–10.3)
lymph#: 1.8 10*3/uL (ref 0.9–3.3)

## 2013-03-12 LAB — COMPREHENSIVE METABOLIC PANEL (CC13)
Albumin: 3.7 g/dL (ref 3.5–5.0)
Alkaline Phosphatase: 90 U/L (ref 40–150)
Anion Gap: 7 mEq/L (ref 3–11)
BUN: 18.2 mg/dL (ref 7.0–26.0)
CO2: 28 mEq/L (ref 22–29)
Glucose: 73 mg/dl (ref 70–140)
Total Bilirubin: 0.39 mg/dL (ref 0.20–1.20)

## 2013-03-12 LAB — LACTATE DEHYDROGENASE (CC13): LDH: 181 U/L (ref 125–245)

## 2013-03-12 LAB — CHCC SMEAR

## 2013-03-12 NOTE — Telephone Encounter (Signed)
gv pt lb appts for March - July - November 2015 - standing q30mo. pt to f/u w/JG @ [facility] in 23yr and is aware he will be contacted w/this appt

## 2013-03-13 NOTE — Progress Notes (Signed)
Hematology and Oncology Follow Up Visit  Cameron Wu [id number] [birth year] 62 y.o. 03/13/2013 7:00 PM   Principle Diagnosis: Encounter Diagnosis  Name Primary?  . Hairy cell leukemia, in remission Yes     Interim History:   Followup visit for this pleasant 62 year old man. He is in his third remission from hairy cell leukemia initially diagnosed in January 1992 when he presented with constitutional symptoms and massive splenomegaly. He was treated with cladribine by [facility] before the drug was officially FDA approved. He had his first progression in July of 1995. He again achieved a complete response with another course of cladribine. He had another progression in June 2007. He was treated with a combination of cladribine plus Rituxan. He has remained in remission since that time.  He has had no interim medical problems. No infections. He has had some right shoulder pain and had an MRI. No rotator cuff tear. He was put on a course of nonsteroidals which is helping.    Medications: reviewed  Allergies:  Allergies  Allergen Reactions  . Penicillins Swelling    Review of Systems: Hematology: No bleeding or bruising ENT ROS: No sore throat Breast ROS: Respiratory ROS: No cough or dyspnea Cardiovascular ROS: No chest pain or palpitations Gastrointestinal ROS:  No recent flareups of IBS Genito-Urinary ROS: No urinary tract symptoms Musculoskeletal ROS no muscle or bone pain Neurological ROS: No headache or change in vision Dermatological ROS: No rash or ecchymosis Remaining ROS negative.  Physical Exam: Blood pressure 137/85, pulse 64, temperature 99.8 F (37.7 C), temperature source Oral, resp. rate 18, height 6' (1.829 m), weight 189 lb (85.73 kg). Wt Readings from Last 3 Encounters:  03/12/13 189 lb (85.73 kg)  03/06/12 183 lb 4.8 oz (83.144 kg)  09/30/11 184 lb (83.462 kg)     General appearance: Well-nourished  Caucasian man HENNT: Pharynx no erythema, exudate, mass, or ulcer. No thyromegaly or thyroid nodules Lymph nodes: No cervical, supraclavicular, or axillary lymphadenopathy Breasts:  Lungs: Clear to auscultation, resonant to percussion throughout Heart: Regular rhythm, no murmur, no gallop, no rub, no click, no edema Abdomen: Soft, nontender, normal bowel sounds, no mass, no organomegaly Extremities: No edema, no calf tenderness Musculoskeletal: no joint deformities GU Vascular: Carotid pulses 2+, no bruits, distal pulses: Dorsalis pedis 1+ symmetric Neurologic: Alert, oriented, PERRLA,   cranial nerves grossly normal, motor strength 5 over 5, reflexes 1+ symmetric, upper body coordination normal, gait normal, Skin: No rash or ecchymosis  Lab Results: CBC W/Diff  White count differential: 70% neutrophils, 22% lymphocytes, 7% monocytes   Component Value Date/Time   WBC 8.0 03/12/2013 1321   RBC 4.55 03/12/2013 1321   HGB 13.9 03/12/2013 1321   HGB 15.1 10/05/2010   HCT 41.8 03/12/2013 1321   HCT 44 10/05/2010   PLT 173 03/12/2013 1321   PLT 159 10/05/2010   MCV 91.9 03/12/2013 1321   MCH 30.5 03/12/2013 1321   MCHC 33.2 03/12/2013 1321   RDW 14.3 03/12/2013 1321   LYMPHSABS 1.8 03/12/2013 1321   MONOABS 0.5 03/12/2013 1321   EOSABS 0.1 03/12/2013 1321   BASOSABS 0.0 03/12/2013 1321     Chemistry      Component Value Date/Time   NA 143 03/12/2013 1320   NA 143 03/08/2011 1057   NA 141 10/05/2010   K 4.0 03/12/2013 1320   K 4.3 03/08/2011 1057   CL 107 03/06/2012 1439   CL 105 03/08/2011 1057   CO2  28 03/12/2013 1320   CO2 31 03/08/2011 1057   BUN 18.2 03/12/2013 1320   BUN 16 03/08/2011 1057   CREATININE 1.0 03/12/2013 1320   CREATININE 1.02 03/08/2011 1057   GLU 103 10/05/2010      Component Value Date/Time   CALCIUM 9.2 03/12/2013 1320   CALCIUM 9.2 03/08/2011 1057   ALKPHOS 90 03/12/2013 1320   ALKPHOS 76 04/12/2011 0825   AST 26 03/12/2013 1320   AST 36  04/12/2011 0825   ALT 46 03/12/2013 1320   ALT 50 04/12/2011 0825   BILITOT 0.39 03/12/2013 1320   BILITOT 0.8 04/12/2011 0825    Review of the peripheral blood film: Overall mature neutrophils and lymphocytes. Occasional reactive lymphocytes with large granules. I was able to see two lymphocytes with grayish cytoplasm but no cytoplasmic projections.   Impression:  #1. Hairy cell leukemia in third remission.  Plan continue every 4 month blood work and annual visits.   #2. Irritable bowel syndrome. Currently not active.normal colonoscopy June 2013 .  #3. BPH symptoms controlled on Flomax.   #4. Fluctuating mild liver function abnormalities with negative evaluation to date. Current liver functions are normal done on 03/12/2013.    CC: Patient Care Team: Carylon Perches, MD as PCP - General (Internal Medicine)   Levert Feinstein, MD 11/25/20147:00 PM

## 2013-06-18 ENCOUNTER — Encounter: Payer: Self-pay | Admitting: Oncology

## 2013-07-11 ENCOUNTER — Other Ambulatory Visit (HOSPITAL_BASED_OUTPATIENT_CLINIC_OR_DEPARTMENT_OTHER): Payer: BC Managed Care – PPO

## 2013-07-11 DIAGNOSIS — C9141 Hairy cell leukemia, in remission: Secondary | ICD-10-CM

## 2013-07-11 DIAGNOSIS — C9140 Hairy cell leukemia not having achieved remission: Secondary | ICD-10-CM

## 2013-07-11 LAB — CBC & DIFF AND RETIC
BASO%: 0.4 % (ref 0.0–2.0)
BASOS ABS: 0 10*3/uL (ref 0.0–0.1)
EOS%: 0.8 % (ref 0.0–7.0)
Eosinophils Absolute: 0 10*3/uL (ref 0.0–0.5)
HEMATOCRIT: 43 % (ref 38.4–49.9)
HEMOGLOBIN: 14.5 g/dL (ref 13.0–17.1)
Immature Retic Fract: 2.6 % — ABNORMAL LOW (ref 3.00–10.60)
LYMPH%: 26.1 % (ref 14.0–49.0)
MCH: 30.4 pg (ref 27.2–33.4)
MCHC: 33.7 g/dL (ref 32.0–36.0)
MCV: 90.1 fL (ref 79.3–98.0)
MONO#: 0.3 10*3/uL (ref 0.1–0.9)
MONO%: 6.4 % (ref 0.0–14.0)
NEUT%: 66.3 % (ref 39.0–75.0)
NEUTROS ABS: 3.5 10*3/uL (ref 1.5–6.5)
PLATELETS: 151 10*3/uL (ref 140–400)
RBC: 4.77 10*6/uL (ref 4.20–5.82)
RDW: 13.4 % (ref 11.0–14.6)
Retic %: 1.11 % (ref 0.80–1.80)
Retic Ct Abs: 52.95 10*3/uL (ref 34.80–93.90)
WBC: 5.3 10*3/uL (ref 4.0–10.3)
lymph#: 1.4 10*3/uL (ref 0.9–3.3)

## 2013-07-11 LAB — MORPHOLOGY: PLT EST: ADEQUATE

## 2013-07-11 LAB — CHCC SMEAR

## 2013-07-12 ENCOUNTER — Telehealth: Payer: Self-pay | Admitting: *Deleted

## 2013-07-12 NOTE — Telephone Encounter (Signed)
Pt notified of good lab results.  He was concerned about further lab & MD appts & what to do.  He did not receive a letter regarding Dr Azucena Freed move from practice, therefore this information was given to pt with instructions to call Ms Elisabeth Cara at [facility] @ [phone]. Original orders were for q 4 mo lab & annual visit with Dr Beryle Beams.

## 2013-07-12 NOTE — Telephone Encounter (Signed)
Message copied by Jesse Fall on Thu Jul 12, 2013  2:46 PM ------      Message from: Annia Belt      Created: Thu Jul 12, 2013 10:00 AM       Call pt lab good ------

## 2013-07-26 ENCOUNTER — Other Ambulatory Visit: Payer: Self-pay | Admitting: Oncology

## 2013-07-26 DIAGNOSIS — C9141 Hairy cell leukemia, in remission: Secondary | ICD-10-CM

## 2013-09-11 ENCOUNTER — Ambulatory Visit (INDEPENDENT_AMBULATORY_CARE_PROVIDER_SITE_OTHER): Payer: BC Managed Care – PPO | Admitting: Urology

## 2013-09-11 DIAGNOSIS — N400 Enlarged prostate without lower urinary tract symptoms: Secondary | ICD-10-CM

## 2013-09-11 DIAGNOSIS — R972 Elevated prostate specific antigen [PSA]: Secondary | ICD-10-CM

## 2013-09-11 DIAGNOSIS — N529 Male erectile dysfunction, unspecified: Secondary | ICD-10-CM

## 2013-11-07 ENCOUNTER — Encounter: Payer: Self-pay | Admitting: Oncology

## 2013-11-07 ENCOUNTER — Ambulatory Visit (INDEPENDENT_AMBULATORY_CARE_PROVIDER_SITE_OTHER): Payer: BC Managed Care – PPO | Admitting: Oncology

## 2013-11-07 ENCOUNTER — Other Ambulatory Visit: Payer: BC Managed Care – PPO

## 2013-11-07 ENCOUNTER — Other Ambulatory Visit (INDEPENDENT_AMBULATORY_CARE_PROVIDER_SITE_OTHER): Payer: BC Managed Care – PPO

## 2013-11-07 VITALS — BP 130/78 | HR 68 | Temp 98.9°F | Ht 72.0 in | Wt 185.1 lb

## 2013-11-07 DIAGNOSIS — C9140 Hairy cell leukemia not having achieved remission: Secondary | ICD-10-CM

## 2013-11-07 DIAGNOSIS — C9141 Hairy cell leukemia, in remission: Secondary | ICD-10-CM

## 2013-11-07 LAB — COMPLETE METABOLIC PANEL WITH GFR
ALBUMIN: 3.9 g/dL (ref 3.5–5.2)
ALT: 25 U/L (ref 0–53)
AST: 21 U/L (ref 0–37)
Alkaline Phosphatase: 85 U/L (ref 39–117)
BUN: 15 mg/dL (ref 6–23)
CALCIUM: 9.1 mg/dL (ref 8.4–10.5)
CHLORIDE: 105 meq/L (ref 96–112)
CO2: 30 meq/L (ref 19–32)
CREATININE: 1.06 mg/dL (ref 0.50–1.35)
GFR, EST AFRICAN AMERICAN: 87 mL/min
GFR, EST NON AFRICAN AMERICAN: 75 mL/min
GLUCOSE: 90 mg/dL (ref 70–99)
Potassium: 4.1 mEq/L (ref 3.5–5.3)
Sodium: 145 mEq/L (ref 135–145)
Total Bilirubin: 0.5 mg/dL (ref 0.2–1.2)
Total Protein: 6.5 g/dL (ref 6.0–8.3)

## 2013-11-07 LAB — CBC WITH DIFFERENTIAL/PLATELET
BASOS ABS: 0 10*3/uL (ref 0.0–0.1)
BASOS PCT: 0 % (ref 0–1)
Eosinophils Absolute: 0.1 10*3/uL (ref 0.0–0.7)
Eosinophils Relative: 1 % (ref 0–5)
HCT: 43.4 % (ref 39.0–52.0)
Hemoglobin: 14.5 g/dL (ref 13.0–17.0)
Lymphocytes Relative: 30 % (ref 12–46)
Lymphs Abs: 1.5 10*3/uL (ref 0.7–4.0)
MCH: 31.1 pg (ref 26.0–34.0)
MCHC: 33.4 g/dL (ref 30.0–36.0)
MCV: 93.1 fL (ref 78.0–100.0)
Monocytes Absolute: 0.4 10*3/uL (ref 0.1–1.0)
Monocytes Relative: 7 % (ref 3–12)
NEUTROS ABS: 3.1 10*3/uL (ref 1.7–7.7)
Neutrophils Relative %: 62 % (ref 43–77)
PLATELETS: 150 10*3/uL (ref 150–400)
RBC: 4.66 MIL/uL (ref 4.22–5.81)
RDW: 14 % (ref 11.5–15.5)
WBC: 5 10*3/uL (ref 4.0–10.5)

## 2013-11-07 LAB — LACTATE DEHYDROGENASE: LDH: 172 U/L (ref 94–250)

## 2013-11-07 LAB — SAVE SMEAR

## 2013-11-07 NOTE — Progress Notes (Signed)
Patient ID: Cameron Wu, male   DOB: June 07, 1950, 63 y.o.   MRN: [id number] Hematology and Oncology Follow Up Visit  Cameron Wu [id number] 1950/08/26 63 y.o. 11/07/2013 5:03 PM   Principle Diagnosis: Encounter Diagnosis  Name Primary?  . Hairy cell leukemia, in remission Yes     Interim History:    Followup visit for this pleasant 63 year old man. He is in his third remission from hairy cell leukemia initially diagnosed in January 1992 when he presented with constitutional symptoms and massive splenomegaly. He was treated with cladribine by [facility] before the drug was officially FDA approved. He had his first progression in July of 1995. He again achieved a complete response with another course of cladribine. He had another progression in June 2007. He was treated with a combination of cladribine plus Rituxan. He has remained in remission since that time.  He has had no interim medical problems. No infections. No flare ups of  previous irritable bowel problems. He has successfully stopped smoking now for a few years. All his children are grown and out of the house and he is enjoying his retirement.   Medications: reviewed  Allergies:  Allergies  Allergen Reactions  . Penicillins Swelling    Review of Systems: Hematology:  No bleeding or bruising ENT ROS: No sore throat Breast ROS:  Respiratory ROS: No cough or dyspnea Cardiovascular ROS:  No change in bowel habit Gastrointestinal ROS: No recurrent GI symptoms    Genito-Urinary ROS: He remains on Proscar for BPH Musculoskeletal ROS: No muscle bone or joint pain Neurological ROS: No headache Dermatological ROS: No rash Remaining ROS negative:   Physical Exam: Blood pressure 130/78, pulse 68, temperature 98.9 F (37.2 C), temperature source Oral, height 6' (1.829 m), weight 185 lb 1.6 oz (83.961 kg), SpO2 97.00%. Wt Readings from Last 3 Encounters:  11/07/13 185 lb  1.6 oz (83.961 kg)  03/12/13 189 lb (85.73 kg)  03/06/12 183 lb 4.8 oz (83.144 kg)     General appearance: Well-nourished Caucasian man HENNT: Pharynx no erythema, exudate, mass, or ulcer. No thyromegaly or thyroid nodules Lymph nodes: No cervical, supraclavicular, or axillary lymphadenopathy Breasts:  Lungs: Clear to auscultation, resonant to percussion throughout Heart: Regular rhythm, no murmur, no gallop, no rub, no click, no edema Abdomen: Soft, nontender, normal bowel sounds, no mass, no organomegaly Extremities: No edema, no calf tenderness Musculoskeletal: no joint deformities GU:  Vascular: Carotid pulses 2+, no bruits,  Neurologic: Alert, oriented, PERRLA,  cranial nerves grossly normal, upper body coordination normal, gait normal, Skin: No rash or ecchymosis  Lab Results: CBC W/Diff  White count differential: 62% neutrophils, 30% lymphocytes, 7% monocytes   Component Value Date/Time   WBC 5.0 11/07/2013 1036   WBC 5.3 07/11/2013 1028   RBC 4.66 11/07/2013 1036   RBC 4.77 07/11/2013 1028   HGB 14.5 11/07/2013 1036   HGB 14.5 07/11/2013 1028   HCT 43.4 11/07/2013 1036   HCT 43.0 07/11/2013 1028   PLT 150 11/07/2013 1036   PLT 151 07/11/2013 1028   MCV 93.1 11/07/2013 1036   MCV 90.1 07/11/2013 1028   MCH 31.1 11/07/2013 1036   MCH 30.4 07/11/2013 1028   MCHC 33.4 11/07/2013 1036   MCHC 33.7 07/11/2013 1028   RDW 14.0 11/07/2013 1036   RDW 13.4 07/11/2013 1028   LYMPHSABS 1.5 11/07/2013 1036   LYMPHSABS 1.4 07/11/2013 1028   MONOABS 0.4 11/07/2013 1036   MONOABS 0.3 07/11/2013 1028  EOSABS 0.1 11/07/2013 1036   EOSABS 0.0 07/11/2013 1028   BASOSABS 0.0 11/07/2013 1036   BASOSABS 0.0 07/11/2013 1028     Chemistry      Component Value Date/Time   NA 145 11/07/2013 1036   NA 143 03/12/2013 1320   NA 141 10/05/2010   K 4.1 11/07/2013 1036   K 4.0 03/12/2013 1320   CL 105 11/07/2013 1036   CL 107 03/06/2012 1439   CO2 30 11/07/2013 1036   CO2 28 03/12/2013 1320   BUN 15 11/07/2013  1036   BUN 18.2 03/12/2013 1320   CREATININE 1.06 11/07/2013 1036   CREATININE 1.0 03/12/2013 1320   CREATININE 1.02 03/08/2011 1057   GLU 103 10/05/2010      Component Value Date/Time   CALCIUM 9.1 11/07/2013 1036   CALCIUM 9.2 03/12/2013 1320   ALKPHOS 85 11/07/2013 1036   ALKPHOS 90 03/12/2013 1320   AST 21 11/07/2013 1036   AST 26 03/12/2013 1320   ALT 25 11/07/2013 1036   ALT 46 03/12/2013 1320   BILITOT 0.5 11/07/2013 1036   BILITOT 0.39 03/12/2013 1320     Review of peripheral blood: No pathologic-appearing lymphocytes noted   Impression:  Hairy cell leukemia in third remission now out 8 years her most recent progression and off all treatment.  Clinically and lab stable at this time. We discussed the recent approval of 2 newly approved oral agents which are showing high activity across the spectrum of low-grade lymphoma and chronic lymphocytic leukemia(ibrutinib and Idelalisib). Although hairy leukemia has not been specifically studied, there is no doubt in my mind that these agents would be effective for this variation of low-grade lymphoma as well. If he should have a recurrence in the future I would explore the use of these agents.  I am currently checking blood counts every 4 months with M.D. visits on an annual basis.     CC: Patient Care Team: [name], MD as PCP - General (Internal Medicine)   Annia Belt, MD 7/22/20155:03 PM

## 2013-11-07 NOTE — Patient Instructions (Signed)
Continue every 4 month lab checks MD visit 11/12/14

## 2013-11-09 ENCOUNTER — Telehealth: Payer: Self-pay | Admitting: *Deleted

## 2013-11-09 NOTE — Telephone Encounter (Signed)
Message copied by Ebbie Latus on Fri Nov 09, 2013 10:39 AM ------      Message from: Annia Belt      Created: Fri Nov 09, 2013  7:49 AM       Call pt: lab alll good ------

## 2013-11-09 NOTE — Telephone Encounter (Signed)
Pt called/informed labs drawn 7/22 are good and within normal range. Voiced no questions.

## 2014-03-06 ENCOUNTER — Other Ambulatory Visit: Payer: BC Managed Care – PPO

## 2014-03-11 ENCOUNTER — Other Ambulatory Visit (INDEPENDENT_AMBULATORY_CARE_PROVIDER_SITE_OTHER): Payer: BC Managed Care – PPO

## 2014-03-11 ENCOUNTER — Telehealth: Payer: Self-pay | Admitting: *Deleted

## 2014-03-11 DIAGNOSIS — C9141 Hairy cell leukemia, in remission: Secondary | ICD-10-CM

## 2014-03-11 LAB — CBC WITH DIFFERENTIAL/PLATELET
Basophils Absolute: 0 10*3/uL (ref 0.0–0.1)
Basophils Relative: 0 % (ref 0–1)
EOS ABS: 0.1 10*3/uL (ref 0.0–0.7)
Eosinophils Relative: 1 % (ref 0–5)
HCT: 44.3 % (ref 39.0–52.0)
Hemoglobin: 15 g/dL (ref 13.0–17.0)
LYMPHS ABS: 1.7 10*3/uL (ref 0.7–4.0)
Lymphocytes Relative: 33 % (ref 12–46)
MCH: 30.9 pg (ref 26.0–34.0)
MCHC: 33.9 g/dL (ref 30.0–36.0)
MCV: 91.3 fL (ref 78.0–100.0)
Monocytes Absolute: 0.3 10*3/uL (ref 0.1–1.0)
Monocytes Relative: 6 % (ref 3–12)
NEUTROS PCT: 60 % (ref 43–77)
Neutro Abs: 3.1 10*3/uL (ref 1.7–7.7)
PLATELETS: 164 10*3/uL (ref 150–400)
RBC: 4.85 MIL/uL (ref 4.22–5.81)
RDW: 13.7 % (ref 11.5–15.5)
WBC: 5.2 10*3/uL (ref 4.0–10.5)

## 2014-03-11 LAB — SAVE SMEAR

## 2014-03-11 NOTE — Telephone Encounter (Signed)
-----   Message from Annia Belt, MD sent at 03/11/2014  1:56 PM EST ----- Call pt lab good

## 2014-03-11 NOTE — Telephone Encounter (Signed)
Pt called/informed labs are good per Dr Beryle Beams; pt had no questions.

## 2014-03-12 ENCOUNTER — Encounter: Payer: Self-pay | Admitting: Oncology

## 2014-07-10 ENCOUNTER — Other Ambulatory Visit (INDEPENDENT_AMBULATORY_CARE_PROVIDER_SITE_OTHER): Payer: BC Managed Care – PPO

## 2014-07-10 DIAGNOSIS — C9141 Hairy cell leukemia, in remission: Secondary | ICD-10-CM

## 2014-07-10 LAB — CBC WITH DIFFERENTIAL/PLATELET
BASOS ABS: 0 10*3/uL (ref 0.0–0.1)
BASOS PCT: 0 % (ref 0–1)
EOS PCT: 0 % (ref 0–5)
Eosinophils Absolute: 0 10*3/uL (ref 0.0–0.7)
HCT: 43.2 % (ref 39.0–52.0)
Hemoglobin: 14.4 g/dL (ref 13.0–17.0)
Lymphocytes Relative: 35 % (ref 12–46)
Lymphs Abs: 1.9 10*3/uL (ref 0.7–4.0)
MCH: 30.1 pg (ref 26.0–34.0)
MCHC: 33.3 g/dL (ref 30.0–36.0)
MCV: 90.4 fL (ref 78.0–100.0)
MONO ABS: 0.3 10*3/uL (ref 0.1–1.0)
MPV: 9.3 fL (ref 8.6–12.4)
Monocytes Relative: 6 % (ref 3–12)
NEUTROS ABS: 3.2 10*3/uL (ref 1.7–7.7)
NEUTROS PCT: 59 % (ref 43–77)
PLATELETS: 163 10*3/uL (ref 150–400)
RBC: 4.78 MIL/uL (ref 4.22–5.81)
RDW: 14.9 % (ref 11.5–15.5)
WBC: 5.5 10*3/uL (ref 4.0–10.5)

## 2014-07-10 LAB — SAVE SMEAR

## 2014-07-11 ENCOUNTER — Telehealth: Payer: Self-pay | Admitting: *Deleted

## 2014-07-11 NOTE — Telephone Encounter (Signed)
Pt called / informed CBC (lab) is normal per Dr Beryle Beams. No questions per pt.

## 2014-07-11 NOTE — Telephone Encounter (Signed)
-----   Message from Annia Belt, MD sent at 07/11/2014  8:00 AM EDT ----- Call pt: CBC normal

## 2014-09-17 ENCOUNTER — Ambulatory Visit (INDEPENDENT_AMBULATORY_CARE_PROVIDER_SITE_OTHER): Payer: BC Managed Care – PPO | Admitting: Urology

## 2014-09-17 DIAGNOSIS — R972 Elevated prostate specific antigen [PSA]: Secondary | ICD-10-CM | POA: Diagnosis not present

## 2014-09-17 DIAGNOSIS — N5201 Erectile dysfunction due to arterial insufficiency: Secondary | ICD-10-CM | POA: Diagnosis not present

## 2014-09-17 DIAGNOSIS — N400 Enlarged prostate without lower urinary tract symptoms: Secondary | ICD-10-CM

## 2014-11-12 ENCOUNTER — Ambulatory Visit (INDEPENDENT_AMBULATORY_CARE_PROVIDER_SITE_OTHER): Payer: BC Managed Care – PPO | Admitting: Oncology

## 2014-11-12 ENCOUNTER — Encounter: Payer: Self-pay | Admitting: Oncology

## 2014-11-12 VITALS — BP 134/73 | HR 72 | Temp 98.3°F | Ht 72.0 in | Wt 184.9 lb

## 2014-11-12 DIAGNOSIS — C9141 Hairy cell leukemia, in remission: Secondary | ICD-10-CM | POA: Diagnosis not present

## 2014-11-12 LAB — COMPREHENSIVE METABOLIC PANEL
ALBUMIN: 4.2 g/dL (ref 3.6–5.1)
ALT: 29 U/L (ref 9–46)
AST: 23 U/L (ref 10–35)
Alkaline Phosphatase: 63 U/L (ref 40–115)
BUN: 17 mg/dL (ref 7–25)
CO2: 28 mEq/L (ref 20–31)
CREATININE: 1.04 mg/dL (ref 0.70–1.25)
Calcium: 9.1 mg/dL (ref 8.6–10.3)
Chloride: 107 mEq/L (ref 98–110)
Glucose, Bld: 92 mg/dL (ref 65–99)
POTASSIUM: 4.4 meq/L (ref 3.5–5.3)
SODIUM: 145 meq/L (ref 135–146)
Total Bilirubin: 0.7 mg/dL (ref 0.2–1.2)
Total Protein: 6.2 g/dL (ref 6.1–8.1)

## 2014-11-12 LAB — CBC WITH DIFFERENTIAL/PLATELET
Basophils Absolute: 0 10*3/uL (ref 0.0–0.1)
Basophils Relative: 0 % (ref 0–1)
Eosinophils Absolute: 0.1 10*3/uL (ref 0.0–0.7)
Eosinophils Relative: 1 % (ref 0–5)
HEMATOCRIT: 40.4 % (ref 39.0–52.0)
Hemoglobin: 13.8 g/dL (ref 13.0–17.0)
LYMPHS ABS: 1.7 10*3/uL (ref 0.7–4.0)
Lymphocytes Relative: 27 % (ref 12–46)
MCH: 30.6 pg (ref 26.0–34.0)
MCHC: 34.2 g/dL (ref 30.0–36.0)
MCV: 89.6 fL (ref 78.0–100.0)
MPV: 9.5 fL (ref 8.6–12.4)
Monocytes Absolute: 0.4 10*3/uL (ref 0.1–1.0)
Monocytes Relative: 7 % (ref 3–12)
NEUTROS PCT: 65 % (ref 43–77)
Neutro Abs: 4.1 10*3/uL (ref 1.7–7.7)
Platelets: 151 10*3/uL (ref 150–400)
RBC: 4.51 MIL/uL (ref 4.22–5.81)
RDW: 14.7 % (ref 11.5–15.5)
WBC: 6.3 10*3/uL (ref 4.0–10.5)

## 2014-11-12 LAB — LACTATE DEHYDROGENASE: LDH: 180 U/L (ref 94–250)

## 2014-11-12 NOTE — Progress Notes (Signed)
Patient ID: Cameron Wu, male   DOB: Feb 26, 1951, 64 y.o.   MRN: [id number] Hematology and Oncology Follow Up Visit  NELSON NOONE [id number] 1950/10/29 64 y.o. 11/12/2014 2:27 PM   Principle Diagnosis: Encounter Diagnosis  Name Primary?  . Hairy cell leukemia, in remission Yes  Clinical Summary: Pleasant 64 year old man in his third remission from hairy cell leukemia initially diagnosed in January 1992 when he presented with constitutional symptoms and massive splenomegaly. He was treated with cladribine by a special exception protocol sent to [facility] from the [facility] before the drug was officially FDA approved. He had his first progression in July of 1995. He again achieved a complete response with another course of cladribine. He had another progression in June 2007. He was treated with a combination of cladribine plus Rituxan. He has remained in remission since that time.   Interim History:   He has had no interim medical problems. He has been successful in staying off tobacco products. 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 He has had no cardiorespiratory symptoms. No change in bowel habit. No constitutional symptoms. His family is doing well. He is downsizing and moving from a large house into a townhouse.  Medications: reviewed  Allergies:  Allergies  Allergen Reactions  . Penicillins Swelling    Review of Systems: See history of present illness Remaining ROS negative:   Physical Exam: Blood pressure 134/73, pulse 72, temperature 98.3 F (36.8 C), temperature source Oral, height 6' (1.829 m), weight 184 lb 14.4 oz (83.87 kg), SpO2 100 %. Wt Readings from Last 3 Encounters:  11/12/14 184 lb 14.4 oz (83.87 kg)  11/07/13 185 lb 1.6 oz (83.961 kg)  03/12/13 189 lb (85.73 kg)     General appearance: Well-nourished Caucasian man HENNT: Pharynx no erythema, exudate, mass, or ulcer. No  thyromegaly or thyroid nodules Lymph nodes: No cervical, supraclavicular, or axillary lymphadenopathy Breasts:  Lungs: Clear to auscultation, resonant to percussion throughout Heart: Regular rhythm, no murmur, no gallop, no rub, no click, no edema Abdomen: Soft, nontender, normal bowel sounds, no mass, no organomegaly Extremities: No edema, no calf tenderness Musculoskeletal: no joint deformities GU:  Vascular: Carotid pulses 2+, no bruits, Neurologic: Alert, oriented, PERRLA, , cranial nerves grossly normal, motor strength 5 over 5, reflexes 1+ symmetric, upper body coordination normal, gait normal, Skin: No rash or ecchymosis  Lab Results: CBC W/Diff    Component Value Date/Time   WBC 5.5 07/10/2014 1046   WBC 5.3 07/11/2013 1028   RBC 4.78 07/10/2014 1046   RBC 4.77 07/11/2013 1028   HGB 14.4 07/10/2014 1046   HGB 14.5 07/11/2013 1028   HCT 43.2 07/10/2014 1046   HCT 43.0 07/11/2013 1028   PLT 163 07/10/2014 1046   PLT 151 07/11/2013 1028   MCV 90.4 07/10/2014 1046   MCV 90.1 07/11/2013 1028   MCH 30.1 07/10/2014 1046   MCH 30.4 07/11/2013 1028   MCHC 33.3 07/10/2014 1046   MCHC 33.7 07/11/2013 1028   RDW 14.9 07/10/2014 1046   RDW 13.4 07/11/2013 1028   LYMPHSABS 1.9 07/10/2014 1046   LYMPHSABS 1.4 07/11/2013 1028   MONOABS 0.3 07/10/2014 1046   MONOABS 0.3 07/11/2013 1028   EOSABS 0.0 07/10/2014 1046   EOSABS 0.0 07/11/2013 1028   BASOSABS 0.0 07/10/2014 1046   BASOSABS 0.0 07/11/2013 1028     Chemistry      Component Value Date/Time   NA 145 11/07/2013 1036  NA 143 03/12/2013 1320   NA 141 10/05/2010   K 4.1 11/07/2013 1036   K 4.0 03/12/2013 1320   CL 105 11/07/2013 1036   CL 107 03/06/2012 1439   CO2 30 11/07/2013 1036   CO2 28 03/12/2013 1320   BUN 15 11/07/2013 1036   BUN 18.2 03/12/2013 1320   CREATININE 1.06 11/07/2013 1036   CREATININE 1.0 03/12/2013 1320   CREATININE 1.02 03/08/2011 1057   GLU 103 10/05/2010      Component Value Date/Time    CALCIUM 9.1 11/07/2013 1036   CALCIUM 9.2 03/12/2013 1320   ALKPHOS 85 11/07/2013 1036   ALKPHOS 90 03/12/2013 1320   AST 21 11/07/2013 1036   AST 26 03/12/2013 1320   ALT 25 11/07/2013 1036   ALT 46 03/12/2013 1320   BILITOT 0.5 11/07/2013 1036   BILITOT 0.39 03/12/2013 1320       Radiological Studies: No results found.  Impression:  Hairy cell leukemia in third remission now out 9 years  From most recent progression and off all treatment.  At time of last years visit, there were 2 newly approved oral agents which are showing high activity across the spectrum of low-grade lymphoma and chronic lymphocytic leukemia(ibrutinib and Idelalisib). Although hairy leukemia has not been specifically studied, there is no doubt in my mind that these agents would be effective for this variation of low-grade lymphoma as well. If he should have a recurrence in the future I would explore the use of these agents. There has been another new development in this field since his visit with me last year. Mutation in a gene called BRAF appears to be a driver of malignant growth in multiple tumors: 50% of melanoma skin cancers and close to 100% of hairy cell leukemias. 2 oral drugs approved for use in melanoma are now being tested in relapsed hairy cell leukemia and appear to be effective: vemurafenib & dabrafinib. The therapeutic landscape continues to improve for this disease.  I will continue to monitor blood counts every 4 months with M.D. visits on an annual basis.   CC: Patient Care Team: [name], MD as PCP - General (Internal Medicine)   Annia Belt, MD 7/26/20162:27 PM

## 2014-11-12 NOTE — Patient Instructions (Signed)
To lab today then standing lab every 4 months MD visit in 1 year lab 1-2 weeks before visit

## 2014-11-13 ENCOUNTER — Telehealth: Payer: Self-pay | Admitting: *Deleted

## 2014-11-13 NOTE — Telephone Encounter (Signed)
-----   Message from Annia Belt, MD sent at 11/13/2014  7:54 AM EDT ----- Call pt: lab all good

## 2014-11-13 NOTE — Telephone Encounter (Signed)
Pt called / informed labs are good per Dr Beryle Beams. No questions asked.

## 2014-12-27 ENCOUNTER — Encounter: Payer: Self-pay | Admitting: Oncology

## 2015-03-12 ENCOUNTER — Other Ambulatory Visit: Payer: Self-pay | Admitting: Oncology

## 2015-03-12 DIAGNOSIS — C9141 Hairy cell leukemia, in remission: Secondary | ICD-10-CM

## 2015-03-17 ENCOUNTER — Other Ambulatory Visit (INDEPENDENT_AMBULATORY_CARE_PROVIDER_SITE_OTHER): Payer: BC Managed Care – PPO

## 2015-03-17 DIAGNOSIS — C9141 Hairy cell leukemia, in remission: Secondary | ICD-10-CM | POA: Diagnosis not present

## 2015-03-18 LAB — CBC WITH DIFFERENTIAL/PLATELET
BASOS ABS: 0 10*3/uL (ref 0.0–0.2)
Basos: 0 %
EOS (ABSOLUTE): 0 10*3/uL (ref 0.0–0.4)
EOS: 0 %
HEMATOCRIT: 42.4 % (ref 37.5–51.0)
Hemoglobin: 14.4 g/dL (ref 12.6–17.7)
IMMATURE GRANULOCYTES: 0 %
Immature Grans (Abs): 0 10*3/uL (ref 0.0–0.1)
Lymphocytes Absolute: 1.1 10*3/uL (ref 0.7–3.1)
Lymphs: 19 %
MCH: 30.8 pg (ref 26.6–33.0)
MCHC: 34 g/dL (ref 31.5–35.7)
MCV: 91 fL (ref 79–97)
MONOCYTES: 8 %
MONOS ABS: 0.5 10*3/uL (ref 0.1–0.9)
NEUTROS PCT: 73 %
Neutrophils Absolute: 4.4 10*3/uL (ref 1.4–7.0)
Platelets: 158 10*3/uL (ref 150–379)
RBC: 4.68 x10E6/uL (ref 4.14–5.80)
RDW: 14.5 % (ref 12.3–15.4)
WBC: 6.1 10*3/uL (ref 3.4–10.8)

## 2015-03-18 LAB — CMP14 + ANION GAP
ALBUMIN: 4.4 g/dL (ref 3.6–4.8)
ALK PHOS: 79 IU/L (ref 39–117)
ALT: 47 IU/L — AB (ref 0–44)
ANION GAP: 16 mmol/L (ref 10.0–18.0)
AST: 38 IU/L (ref 0–40)
Albumin/Globulin Ratio: 2.3 (ref 1.1–2.5)
BILIRUBIN TOTAL: 0.5 mg/dL (ref 0.0–1.2)
BUN / CREAT RATIO: 16 (ref 10–22)
BUN: 14 mg/dL (ref 8–27)
CHLORIDE: 104 mmol/L (ref 97–106)
CO2: 25 mmol/L (ref 18–29)
CREATININE: 0.88 mg/dL (ref 0.76–1.27)
Calcium: 8.9 mg/dL (ref 8.6–10.2)
GFR calc Af Amer: 105 mL/min/{1.73_m2} (ref >59)
GFR calc non Af Amer: 91 mL/min/{1.73_m2} (ref >59)
GLUCOSE: 85 mg/dL (ref 65–99)
Globulin, Total: 1.9 g/dL (ref 1.5–4.5)
Potassium: 4.2 mmol/L (ref 3.5–5.2)
Sodium: 145 mmol/L — ABNORMAL HIGH (ref 136–144)
Total Protein: 6.3 g/dL (ref 6.0–8.5)

## 2015-03-18 LAB — LACTATE DEHYDROGENASE: LDH: 188 IU/L (ref 121–224)

## 2015-03-20 ENCOUNTER — Telehealth: Payer: Self-pay | Admitting: *Deleted

## 2015-03-20 NOTE — Telephone Encounter (Signed)
-----   Message from Annia Belt, MD sent at 03/19/2015  1:29 PM EST ----- Call lab good

## 2015-03-20 NOTE — Telephone Encounter (Signed)
Pt called / informed labs are good per Dr Beryle Beams. Voiced understanding and thank-you and Happy Holidays.

## 2015-07-11 ENCOUNTER — Telehealth: Payer: Self-pay | Admitting: Internal Medicine

## 2015-07-11 NOTE — Telephone Encounter (Signed)
APPT. REMINDER CALL, LMTCB °

## 2015-07-14 ENCOUNTER — Other Ambulatory Visit (INDEPENDENT_AMBULATORY_CARE_PROVIDER_SITE_OTHER): Payer: BC Managed Care – PPO

## 2015-07-14 DIAGNOSIS — C9141 Hairy cell leukemia, in remission: Secondary | ICD-10-CM

## 2015-07-15 ENCOUNTER — Telehealth: Payer: Self-pay | Admitting: *Deleted

## 2015-07-15 LAB — CMP14 + ANION GAP
A/G RATIO: 2 (ref 1.2–2.2)
ALK PHOS: 72 IU/L (ref 39–117)
ALT: 46 IU/L — ABNORMAL HIGH (ref 0–44)
AST: 33 IU/L (ref 0–40)
Albumin: 4.3 g/dL (ref 3.6–4.8)
Anion Gap: 19 mmol/L — ABNORMAL HIGH (ref 10.0–18.0)
BILIRUBIN TOTAL: 0.6 mg/dL (ref 0.0–1.2)
BUN/Creatinine Ratio: 13 (ref 10–22)
BUN: 14 mg/dL (ref 8–27)
CALCIUM: 9.1 mg/dL (ref 8.6–10.2)
CHLORIDE: 101 mmol/L (ref 96–106)
CO2: 22 mmol/L (ref 18–29)
CREATININE: 1.11 mg/dL (ref 0.76–1.27)
GFR calc Af Amer: 81 mL/min/{1.73_m2} (ref >59)
GFR calc non Af Amer: 70 mL/min/{1.73_m2} (ref >59)
GLOBULIN, TOTAL: 2.2 g/dL (ref 1.5–4.5)
GLUCOSE: 71 mg/dL (ref 65–99)
POTASSIUM: 4.3 mmol/L (ref 3.5–5.2)
SODIUM: 142 mmol/L (ref 134–144)
Total Protein: 6.5 g/dL (ref 6.0–8.5)

## 2015-07-15 LAB — CBC WITH DIFFERENTIAL/PLATELET
BASOS ABS: 0 10*3/uL (ref 0.0–0.2)
Basos: 0 %
EOS (ABSOLUTE): 0.1 10*3/uL (ref 0.0–0.4)
Eos: 1 %
Hematocrit: 43.8 % (ref 37.5–51.0)
Hemoglobin: 14.9 g/dL (ref 12.6–17.7)
IMMATURE GRANS (ABS): 0 10*3/uL (ref 0.0–0.1)
Immature Granulocytes: 0 %
LYMPHS: 30 %
Lymphocytes Absolute: 1.7 10*3/uL (ref 0.7–3.1)
MCH: 31.2 pg (ref 26.6–33.0)
MCHC: 34 g/dL (ref 31.5–35.7)
MCV: 92 fL (ref 79–97)
MONOS ABS: 0.4 10*3/uL (ref 0.1–0.9)
Monocytes: 7 %
NEUTROS ABS: 3.5 10*3/uL (ref 1.4–7.0)
NEUTROS PCT: 62 %
PLATELETS: 174 10*3/uL (ref 150–379)
RBC: 4.77 x10E6/uL (ref 4.14–5.80)
RDW: 14.2 % (ref 12.3–15.4)
WBC: 5.7 10*3/uL (ref 3.4–10.8)

## 2015-07-15 LAB — LACTATE DEHYDROGENASE: LDH: 194 IU/L (ref 121–224)

## 2015-07-15 NOTE — Telephone Encounter (Signed)
-----   Message from Annia Belt, MD sent at 07/15/2015 11:17 AM EDT ----- Call pt: lab all good

## 2015-07-15 NOTE — Telephone Encounter (Signed)
Pt called / informed labs are good per Dr Beryle Beams. "Thank you"

## 2015-10-28 ENCOUNTER — Ambulatory Visit (INDEPENDENT_AMBULATORY_CARE_PROVIDER_SITE_OTHER): Payer: Medicare Other | Admitting: Urology

## 2015-10-28 DIAGNOSIS — N401 Enlarged prostate with lower urinary tract symptoms: Secondary | ICD-10-CM | POA: Diagnosis not present

## 2015-10-28 DIAGNOSIS — R972 Elevated prostate specific antigen [PSA]: Secondary | ICD-10-CM | POA: Diagnosis not present

## 2015-11-03 ENCOUNTER — Other Ambulatory Visit: Payer: Self-pay | Admitting: Oncology

## 2015-11-03 DIAGNOSIS — C9141 Hairy cell leukemia, in remission: Secondary | ICD-10-CM

## 2015-11-07 ENCOUNTER — Other Ambulatory Visit (INDEPENDENT_AMBULATORY_CARE_PROVIDER_SITE_OTHER): Payer: Medicare Other

## 2015-11-07 DIAGNOSIS — C9141 Hairy cell leukemia, in remission: Secondary | ICD-10-CM

## 2015-11-07 LAB — SAVE SMEAR

## 2015-11-08 LAB — CBC WITH DIFFERENTIAL/PLATELET
BASOS: 0 %
Basophils Absolute: 0 10*3/uL (ref 0.0–0.2)
EOS (ABSOLUTE): 0.1 10*3/uL (ref 0.0–0.4)
EOS: 1 %
HEMOGLOBIN: 14.2 g/dL (ref 12.6–17.7)
Hematocrit: 43.1 % (ref 37.5–51.0)
IMMATURE GRANS (ABS): 0 10*3/uL (ref 0.0–0.1)
Immature Granulocytes: 0 %
Lymphocytes Absolute: 1.6 10*3/uL (ref 0.7–3.1)
Lymphs: 30 %
MCH: 30.4 pg (ref 26.6–33.0)
MCHC: 32.9 g/dL (ref 31.5–35.7)
MCV: 92 fL (ref 79–97)
MONOS ABS: 0.4 10*3/uL (ref 0.1–0.9)
Monocytes: 7 %
NEUTROS ABS: 3.2 10*3/uL (ref 1.4–7.0)
Neutrophils: 62 %
Platelets: 162 10*3/uL (ref 150–379)
RBC: 4.67 x10E6/uL (ref 4.14–5.80)
RDW: 14.6 % (ref 12.3–15.4)
WBC: 5.2 10*3/uL (ref 3.4–10.8)

## 2015-11-10 ENCOUNTER — Telehealth: Payer: Self-pay | Admitting: *Deleted

## 2015-11-10 NOTE — Telephone Encounter (Signed)
Called pt - no answer; left message labs are within normal range per Dr Beryle Beams. And to call if he has any questions.

## 2015-11-10 NOTE — Telephone Encounter (Signed)
-----   Message from Annia Belt, MD sent at 11/10/2015  7:44 AM EDT ----- Call pt: CBC normal

## 2015-11-17 ENCOUNTER — Ambulatory Visit (INDEPENDENT_AMBULATORY_CARE_PROVIDER_SITE_OTHER): Payer: Medicare Other | Admitting: Oncology

## 2015-11-17 ENCOUNTER — Encounter: Payer: Self-pay | Admitting: Oncology

## 2015-11-17 VITALS — BP 126/67 | HR 70 | Temp 98.3°F | Ht 72.0 in | Wt 192.4 lb

## 2015-11-17 DIAGNOSIS — C9141 Hairy cell leukemia, in remission: Secondary | ICD-10-CM | POA: Diagnosis not present

## 2015-11-17 NOTE — Progress Notes (Signed)
Hematology and Oncology Follow Up Visit  CEON GRISHABER [id number] 1951-02-01 65 y.o. 11/17/2015 4:59 PM   Principle Diagnosis: Encounter Diagnosis  Name Primary?  . Hairy cell leukemia, in remission [facility]) Yes  Clinical summary: Pleasant 65 year old man in his third remission from hairy cell leukemia initially diagnosed in January 1992 when he presented with constitutional symptoms and massive splenomegaly. He was treated with cladribine by a special exception protocol sent to [facility] from the [facility] before the drug was officially FDA approved. He had his first progression in July of 1995. He again achieved a complete response with another course of cladribine. He had another progression in June 2007. He was treated with a combination of cladribine plus Rituxan. He has remained in remission since that time.   Interim History:   Mr. Raahil is 65 years old today. He is now out an amazing 25 years from initial diagnosis of pericellular leukemia. Despite 2 subsequent progressions, he remains free of obvious disease and off all treatment for the last 10 years. He has had no interim medical problems. No serious infections. Occasional loose stools for which she uses when necessary Imodium. He still takes medications for BPH. When necessary H2-blocker for reflux. His family is doing well. One of his children is going to get married soon. He is enjoying his retirement.  Medications: reviewed  Allergies:  Allergies  Allergen Reactions  . Penicillins Swelling    Review of Systems:  Remaining ROS negative:   Physical Exam: Blood pressure 126/67, pulse 70, temperature 98.3 F (36.8 C), temperature source Oral, height 6' (1.829 m), weight 192 lb 6.4 oz (87.3 kg). Wt Readings from Last 3 Encounters:  11/17/15 192 lb 6.4 oz (87.3 kg)  11/12/14 184 lb 14.4 oz (83.9 kg)  11/07/13 185 lb 1.6 oz (84 kg)     General appearance: Well-nourished Caucasian man HENNT: Pharynx  no erythema, exudate, mass, or ulcer. No thyromegaly or thyroid nodules Lymph nodes: No cervical, supraclavicular, or axillary lymphadenopathy Breasts: Lungs: Clear to auscultation, resonant to percussion throughout Heart: Regular rhythm, no murmur, no gallop, no rub, no click, no edema Abdomen: Soft, nontender, normal bowel sounds, no mass, no organomegaly Extremities: No edema, no calf tenderness Musculoskeletal: no joint deformities GU:  Vascular: Carotid pulses 2+, no bruits,  Neurologic: Alert, oriented, PERRLA,, cranial nerves grossly normal, motor strength 5 over 5, reflexes 1+ symmetric, upper body coordination normal, gait normal, Skin: No rash or ecchymosis  Lab Results: CBC W/Diff    Component Value Date/Time   WBC 5.2 11/07/2015 0921   WBC 6.3 11/12/2014 1121   RBC 4.67 11/07/2015 0921   RBC 4.51 11/12/2014 1121   HGB 13.8 11/12/2014 1121   HGB 14.5 07/11/2013 1028   HCT 43.1 11/07/2015 0921   HCT 43.0 07/11/2013 1028   PLT 162 11/07/2015 0921   MCV 92 11/07/2015 0921   MCV 90.1 07/11/2013 1028   MCH 30.4 11/07/2015 0921   MCH 30.6 11/12/2014 1121   MCHC 32.9 11/07/2015 0921   MCHC 34.2 11/12/2014 1121   RDW 14.6 11/07/2015 0921   RDW 13.4 07/11/2013 1028   LYMPHSABS 1.6 11/07/2015 0921   LYMPHSABS 1.4 07/11/2013 1028   MONOABS 0.4 11/12/2014 1121   MONOABS 0.3 07/11/2013 1028   EOSABS 0.1 11/07/2015 0921   BASOSABS 0.0 11/07/2015 0921   BASOSABS 0.0 07/11/2013 1028     Chemistry      Component Value Date/Time   NA 142 07/14/2015 1003   NA 143 03/12/2013  1320   K 4.3 07/14/2015 1003   K 4.0 03/12/2013 1320   CL 101 07/14/2015 1003   CL 107 03/06/2012 1439   CO2 22 07/14/2015 1003   CO2 28 03/12/2013 1320   BUN 14 07/14/2015 1003   BUN 18.2 03/12/2013 1320   CREATININE 1.11 07/14/2015 1003   CREATININE 1.04 11/12/2014 1121   CREATININE 1.0 03/12/2013 1320   GLU 103 10/05/2010      Component Value Date/Time   CALCIUM 9.1 07/14/2015 1003    CALCIUM 9.2 03/12/2013 1320   ALKPHOS 72 07/14/2015 1003   ALKPHOS 90 03/12/2013 1320   AST 33 07/14/2015 1003   AST 26 03/12/2013 1320   ALT 46 (H) 07/14/2015 1003   ALT 46 03/12/2013 1320   BILITOT 0.6 07/14/2015 1003   BILITOT 0.39 03/12/2013 1320       Radiological Studies: No results found.  Impression:  Hairy cell leukemia in third remission I will continue to check quarterly blood counts with blood film reviews and annual physical exam.   CC: Patient Care Team: [name], MD as PCP - General (Internal Medicine)   Annia Belt, MD 7/31/20174:59 PM

## 2015-11-17 NOTE — Patient Instructions (Signed)
Continue CBC checks every 4 months MD visit 1 year Lab 1 week before visit

## 2016-04-01 ENCOUNTER — Telehealth: Payer: Self-pay | Admitting: Internal Medicine

## 2016-04-01 NOTE — Telephone Encounter (Signed)
APT. REMINDER CALL, LMTCB °

## 2016-04-02 ENCOUNTER — Other Ambulatory Visit (INDEPENDENT_AMBULATORY_CARE_PROVIDER_SITE_OTHER): Payer: Medicare Other

## 2016-04-02 DIAGNOSIS — C9141 Hairy cell leukemia, in remission: Secondary | ICD-10-CM

## 2016-04-02 LAB — SAVE SMEAR

## 2016-04-03 LAB — CBC WITH DIFFERENTIAL/PLATELET
BASOS: 0 %
Basophils Absolute: 0 10*3/uL (ref 0.0–0.2)
EOS (ABSOLUTE): 0 10*3/uL (ref 0.0–0.4)
EOS: 1 %
HEMATOCRIT: 43.7 % (ref 37.5–51.0)
Hemoglobin: 14.5 g/dL (ref 13.0–17.7)
IMMATURE GRANS (ABS): 0 10*3/uL (ref 0.0–0.1)
IMMATURE GRANULOCYTES: 0 %
LYMPHS: 35 %
Lymphocytes Absolute: 1.9 10*3/uL (ref 0.7–3.1)
MCH: 30.6 pg (ref 26.6–33.0)
MCHC: 33.2 g/dL (ref 31.5–35.7)
MCV: 92 fL (ref 79–97)
Monocytes Absolute: 0.4 10*3/uL (ref 0.1–0.9)
Monocytes: 7 %
NEUTROS PCT: 57 %
Neutrophils Absolute: 3 10*3/uL (ref 1.4–7.0)
Platelets: 180 10*3/uL (ref 150–379)
RBC: 4.74 x10E6/uL (ref 4.14–5.80)
RDW: 14.3 % (ref 12.3–15.4)
WBC: 5.3 10*3/uL (ref 3.4–10.8)

## 2016-04-06 ENCOUNTER — Telehealth: Payer: Self-pay | Admitting: *Deleted

## 2016-04-06 NOTE — Telephone Encounter (Signed)
Pt called - no answer; left message "CBC remains normal. Merry Christmas " per DR Beryle Beams. And call for any questions.

## 2016-04-06 NOTE — Telephone Encounter (Signed)
-----   Message from Cameron Belt, MD sent at 04/05/2016  7:59 AM EST ----- Call pt: CBC remains normal. [name]

## 2016-05-18 ENCOUNTER — Encounter: Payer: Self-pay | Admitting: Internal Medicine

## 2016-08-02 ENCOUNTER — Other Ambulatory Visit (INDEPENDENT_AMBULATORY_CARE_PROVIDER_SITE_OTHER): Payer: Medicare Other

## 2016-08-02 DIAGNOSIS — C9141 Hairy cell leukemia, in remission: Secondary | ICD-10-CM

## 2016-08-02 LAB — SAVE SMEAR

## 2016-08-03 ENCOUNTER — Telehealth: Payer: Self-pay | Admitting: *Deleted

## 2016-08-03 LAB — CBC WITH DIFFERENTIAL/PLATELET
BASOS ABS: 0 10*3/uL (ref 0.0–0.2)
Basos: 0 %
EOS (ABSOLUTE): 0 10*3/uL (ref 0.0–0.4)
EOS: 1 %
HEMATOCRIT: 41.3 % (ref 37.5–51.0)
Hemoglobin: 14.2 g/dL (ref 13.0–17.7)
Immature Grans (Abs): 0 10*3/uL (ref 0.0–0.1)
Immature Granulocytes: 0 %
LYMPHS ABS: 1.5 10*3/uL (ref 0.7–3.1)
LYMPHS: 28 %
MCH: 30.6 pg (ref 26.6–33.0)
MCHC: 34.4 g/dL (ref 31.5–35.7)
MCV: 89 fL (ref 79–97)
MONOCYTES: 9 %
Monocytes Absolute: 0.5 10*3/uL (ref 0.1–0.9)
Neutrophils Absolute: 3.4 10*3/uL (ref 1.4–7.0)
Neutrophils: 62 %
Platelets: 183 10*3/uL (ref 150–379)
RBC: 4.64 x10E6/uL (ref 4.14–5.80)
RDW: 14.9 % (ref 12.3–15.4)
WBC: 5.5 10*3/uL (ref 3.4–10.8)

## 2016-08-03 NOTE — Telephone Encounter (Signed)
-----   Message from Annia Belt, MD sent at 08/03/2016 10:51 AM EDT ----- Call pt: CBC remains normal

## 2016-08-03 NOTE — Telephone Encounter (Signed)
Pt called / informed "CBC remains normal " per Dr Granfortuna. 

## 2016-11-02 ENCOUNTER — Ambulatory Visit (INDEPENDENT_AMBULATORY_CARE_PROVIDER_SITE_OTHER): Payer: Medicare Other | Admitting: Urology

## 2016-11-02 DIAGNOSIS — N401 Enlarged prostate with lower urinary tract symptoms: Secondary | ICD-10-CM | POA: Diagnosis not present

## 2016-11-02 DIAGNOSIS — N5201 Erectile dysfunction due to arterial insufficiency: Secondary | ICD-10-CM | POA: Diagnosis not present

## 2016-11-05 ENCOUNTER — Other Ambulatory Visit: Payer: Medicare Other

## 2016-11-16 ENCOUNTER — Ambulatory Visit: Payer: Medicare Other | Admitting: Oncology

## 2016-11-24 ENCOUNTER — Other Ambulatory Visit: Payer: Self-pay | Admitting: Oncology

## 2016-11-24 DIAGNOSIS — C9141 Hairy cell leukemia, in remission: Secondary | ICD-10-CM

## 2016-11-26 NOTE — Addendum Note (Signed)
Addended by: Truddie Crumble on: 11/26/2016 03:54 PM   Modules accepted: Orders

## 2016-11-29 ENCOUNTER — Other Ambulatory Visit: Payer: Self-pay | Admitting: Oncology

## 2016-11-29 ENCOUNTER — Other Ambulatory Visit (INDEPENDENT_AMBULATORY_CARE_PROVIDER_SITE_OTHER): Payer: Medicare Other

## 2016-11-29 DIAGNOSIS — C9141 Hairy cell leukemia, in remission: Secondary | ICD-10-CM

## 2016-11-29 LAB — CBC WITH DIFFERENTIAL/PLATELET
BASOS ABS: 0 10*3/uL (ref 0.0–0.1)
Basophils Relative: 0 %
EOS ABS: 0.1 10*3/uL (ref 0.0–0.7)
Eosinophils Relative: 1 %
HCT: 41.9 % (ref 39.0–52.0)
Hemoglobin: 14.1 g/dL (ref 13.0–17.0)
LYMPHS PCT: 32 %
Lymphs Abs: 1.7 10*3/uL (ref 0.7–4.0)
MCH: 30.4 pg (ref 26.0–34.0)
MCHC: 33.7 g/dL (ref 30.0–36.0)
MCV: 90.3 fL (ref 78.0–100.0)
Monocytes Absolute: 0.4 10*3/uL (ref 0.1–1.0)
Monocytes Relative: 7 %
NEUTROS PCT: 60 %
Neutro Abs: 3.3 10*3/uL (ref 1.7–7.7)
PLATELETS: 148 10*3/uL — AB (ref 150–400)
RBC: 4.64 MIL/uL (ref 4.22–5.81)
RDW: 13.9 % (ref 11.5–15.5)
WBC: 5.4 10*3/uL (ref 4.0–10.5)

## 2016-11-29 LAB — SAVE SMEAR

## 2016-11-30 ENCOUNTER — Telehealth: Payer: Self-pay | Admitting: *Deleted

## 2016-11-30 NOTE — Telephone Encounter (Signed)
-----   Message from Annia Belt, MD sent at 11/29/2016  6:09 PM EDT ----- Call pt: platelets slightly decreased from previous. Everything else stable. Probably nothing. Let's repeat a CBC in a month.

## 2016-11-30 NOTE — Telephone Encounter (Signed)
Pt called / informed "platelets slightly decreased from previous ( now at 148,000). Everything else stable. Probably nothing. Let's repeat a CBC in a month" per Dr Beryle Beams. Lab aptp ascheduled 9/13@ 10AM.

## 2016-12-07 ENCOUNTER — Encounter: Payer: Self-pay | Admitting: Oncology

## 2016-12-07 ENCOUNTER — Ambulatory Visit (INDEPENDENT_AMBULATORY_CARE_PROVIDER_SITE_OTHER): Payer: Medicare Other | Admitting: Oncology

## 2016-12-07 VITALS — BP 133/72 | HR 70 | Temp 98.1°F | Ht 72.0 in | Wt 189.0 lb

## 2016-12-07 DIAGNOSIS — C9141 Hairy cell leukemia, in remission: Secondary | ICD-10-CM

## 2016-12-07 NOTE — Patient Instructions (Signed)
Return 9/11 for lab then lab every 3 months beginning on 03/29/17 MD visit 1 year

## 2016-12-07 NOTE — Progress Notes (Signed)
Hematology and Oncology Follow Up Visit  Cameron Wu [id number] [birth year] 66 y.o. 12/07/2016 2:53 PM   Principle Diagnosis: Encounter Diagnosis  Name Primary?  . Hairy cell leukemia, in remission [facility]) Yes  Clinical summary: Pleasant 66 year old man in his third remission from hairy cell leukemia initially diagnosed in January 1992 when he presented with constitutional symptoms and massive splenomegaly. He was treated with cladribine by a special exception protocol sent to [facility] from the [facility] before the drug was officially FDA approved. He had his first progression in July of 1995. He again achieved a complete response with another course of cladribine. He had another progression in June 2007. He was treated with a combination of cladribine plus Rituxan. He has remained in remission since that time.   Interim History:   He is doing well. No interim medical problems. All of his previous GI issues resolved when he retired. No cardiorespiratory symptoms. No GU symptoms now on Flomax and finasteride.  Medications: reviewed  Allergies:  Allergies  Allergen Reactions  . Penicillins Swelling    Review of Systems: See interim history Remaining ROS negative:   Physical Exam: Blood pressure 133/72, pulse 70, temperature 98.1 F (36.7 C), temperature source Oral, height 6' (1.829 m), weight 189 lb (85.7 kg), SpO2 100 %. Wt Readings from Last 3 Encounters:  12/07/16 189 lb (85.7 kg)  11/17/15 192 lb 6.4 oz (87.3 kg)  11/12/14 184 lb 14.4 oz (83.9 kg)     General appearance: Thin but adequately nourished Caucasian man HENNT: Pharynx no erythema, exudate, mass, or ulcer. No thyromegaly or thyroid nodules Lymph nodes: No cervical, supraclavicular, or axillary lymphadenopathy Breasts Lungs: Clear to auscultation, resonant to percussion throughout Heart: Regular rhythm, no murmur, no gallop, no rub, no click, no edema Abdomen: Soft, nontender, normal bowel  sounds, no mass, no organomegaly Extremities: No edema, no calf tenderness Musculoskeletal: no joint deformities GU:  Vascular: Carotid pulses 2+, no bruits,  Neurologic: Alert, oriented, PERRLA,  , cranial nerves grossly normal, motor strength 5 over 5, reflexes 1+ symmetric, upper body coordination normal, gait normal, Skin: No rash or ecchymosis  Lab Results: CBC W/Diff    Component Value Date/Time   WBC 5.4 11/29/2016 1009   RBC 4.64 11/29/2016 1009   HGB 14.1 11/29/2016 1009   HGB 14.2 08/02/2016 1035   HGB 14.5 07/11/2013 1028   HCT 41.9 11/29/2016 1009   HCT 41.3 08/02/2016 1035   HCT 43.0 07/11/2013 1028   PLT 148 (L) 11/29/2016 1009   PLT 183 08/02/2016 1035   MCV 90.3 11/29/2016 1009   MCV 89 08/02/2016 1035   MCV 90.1 07/11/2013 1028   MCH 30.4 11/29/2016 1009   MCHC 33.7 11/29/2016 1009   RDW 13.9 11/29/2016 1009   RDW 14.9 08/02/2016 1035   RDW 13.4 07/11/2013 1028   LYMPHSABS 1.7 11/29/2016 1009   LYMPHSABS 1.5 08/02/2016 1035   LYMPHSABS 1.4 07/11/2013 1028   MONOABS 0.4 11/29/2016 1009   MONOABS 0.3 07/11/2013 1028   EOSABS 0.1 11/29/2016 1009   EOSABS 0.0 08/02/2016 1035   BASOSABS 0.0 11/29/2016 1009   BASOSABS 0.0 08/02/2016 1035   BASOSABS 0.0 07/11/2013 1028     Chemistry      Component Value Date/Time   NA 142 07/14/2015 1003   NA 143 03/12/2013 1320   K 4.3 07/14/2015 1003   K 4.0 03/12/2013 1320   CL 101 07/14/2015 1003   CL 107 03/06/2012 1439   CO2 22 07/14/2015 1003  CO2 28 03/12/2013 1320   BUN 14 07/14/2015 1003   BUN 18.2 03/12/2013 1320   CREATININE 1.11 07/14/2015 1003   CREATININE 1.04 11/12/2014 1121   CREATININE 1.0 03/12/2013 1320   GLU 103 10/05/2010      Component Value Date/Time   CALCIUM 9.1 07/14/2015 1003   CALCIUM 9.2 03/12/2013 1320   ALKPHOS 72 07/14/2015 1003   ALKPHOS 90 03/12/2013 1320   AST 33 07/14/2015 1003   AST 26 03/12/2013 1320   ALT 46 (H) 07/14/2015 1003   ALT 46 03/12/2013 1320   BILITOT 0.6  07/14/2015 1003   BILITOT 0.39 03/12/2013 1320       Radiological Studies: No results found.  Impression:  Hairy cell leukemia in third remission  I will continue to check quarterly blood counts with blood film reviews and annual physical exam. Platelet count last week 143,000. Probably a minor lab variation. Hemoglobin stable. White count and differential stable. I will repeat a CBC again in 2 weeks. I anticipate going back on his every 3 month schedule subsequent to that.  CC: Patient Care Team: [name], MD as PCP - General (Internal Medicine)   Murriel Hopper, MD, [place]  Hematology-Oncology/Internal Medicine     8/21/20182:53 PM

## 2016-12-28 ENCOUNTER — Other Ambulatory Visit (INDEPENDENT_AMBULATORY_CARE_PROVIDER_SITE_OTHER): Payer: Medicare Other

## 2016-12-28 ENCOUNTER — Other Ambulatory Visit: Payer: Medicare Other

## 2016-12-28 DIAGNOSIS — C9141 Hairy cell leukemia, in remission: Secondary | ICD-10-CM | POA: Diagnosis not present

## 2016-12-29 ENCOUNTER — Telehealth: Payer: Self-pay | Admitting: *Deleted

## 2016-12-29 LAB — CBC WITH DIFFERENTIAL/PLATELET
BASOS: 0 %
Basophils Absolute: 0 10*3/uL (ref 0.0–0.2)
EOS (ABSOLUTE): 0.1 10*3/uL (ref 0.0–0.4)
EOS: 1 %
HEMATOCRIT: 44 % (ref 37.5–51.0)
HEMOGLOBIN: 14.7 g/dL (ref 13.0–17.7)
Immature Grans (Abs): 0 10*3/uL (ref 0.0–0.1)
Immature Granulocytes: 0 %
LYMPHS ABS: 1.7 10*3/uL (ref 0.7–3.1)
Lymphs: 28 %
MCH: 30.7 pg (ref 26.6–33.0)
MCHC: 33.4 g/dL (ref 31.5–35.7)
MCV: 92 fL (ref 79–97)
MONOCYTES: 7 %
Monocytes Absolute: 0.4 10*3/uL (ref 0.1–0.9)
Neutrophils Absolute: 3.7 10*3/uL (ref 1.4–7.0)
Neutrophils: 64 %
Platelets: 180 10*3/uL (ref 150–379)
RBC: 4.79 x10E6/uL (ref 4.14–5.80)
RDW: 14.2 % (ref 12.3–15.4)
WBC: 5.9 10*3/uL (ref 3.4–10.8)

## 2016-12-29 LAB — COMPREHENSIVE METABOLIC PANEL
ALBUMIN: 4.2 g/dL (ref 3.6–4.8)
ALT: 36 IU/L (ref 0–44)
AST: 29 IU/L (ref 0–40)
Albumin/Globulin Ratio: 1.7 (ref 1.2–2.2)
Alkaline Phosphatase: 92 IU/L (ref 39–117)
BUN/Creatinine Ratio: 11 (ref 10–24)
BUN: 13 mg/dL (ref 8–27)
Bilirubin Total: 0.5 mg/dL (ref 0.0–1.2)
CALCIUM: 9.2 mg/dL (ref 8.6–10.2)
CO2: 26 mmol/L (ref 20–29)
CREATININE: 1.2 mg/dL (ref 0.76–1.27)
Chloride: 104 mmol/L (ref 96–106)
GFR, EST AFRICAN AMERICAN: 72 mL/min/{1.73_m2} (ref >59)
GFR, EST NON AFRICAN AMERICAN: 63 mL/min/{1.73_m2} (ref >59)
GLOBULIN, TOTAL: 2.5 g/dL (ref 1.5–4.5)
Glucose: 69 mg/dL (ref 65–99)
Potassium: 4.2 mmol/L (ref 3.5–5.2)
SODIUM: 145 mmol/L — AB (ref 134–144)
TOTAL PROTEIN: 6.7 g/dL (ref 6.0–8.5)

## 2016-12-29 LAB — LACTATE DEHYDROGENASE: LDH: 165 IU/L (ref 121–224)

## 2016-12-29 NOTE — Telephone Encounter (Signed)
-----   Message from Annia Belt, MD sent at 12/29/2016  6:29 AM EDT ----- Call pt: platelet count back to normal; everything else good

## 2016-12-29 NOTE — Telephone Encounter (Signed)
Called pt - no answer; left message "platelet count back to normal; everything else good " per Dr Beryle Beams. And call for any questions.

## 2016-12-30 ENCOUNTER — Other Ambulatory Visit: Payer: Medicare Other

## 2017-01-01 ENCOUNTER — Encounter: Payer: Self-pay | Admitting: Oncology

## 2017-03-29 ENCOUNTER — Other Ambulatory Visit: Payer: Medicare Other

## 2017-06-28 ENCOUNTER — Other Ambulatory Visit (INDEPENDENT_AMBULATORY_CARE_PROVIDER_SITE_OTHER): Payer: Medicare Other

## 2017-06-28 DIAGNOSIS — C9141 Hairy cell leukemia, in remission: Secondary | ICD-10-CM

## 2017-06-29 LAB — CBC WITH DIFFERENTIAL/PLATELET
Basophils Absolute: 0 10*3/uL (ref 0.0–0.2)
Basos: 0 %
EOS (ABSOLUTE): 0 10*3/uL (ref 0.0–0.4)
Eos: 1 %
Hematocrit: 42.7 % (ref 37.5–51.0)
Hemoglobin: 14.6 g/dL (ref 13.0–17.7)
Immature Grans (Abs): 0 10*3/uL (ref 0.0–0.1)
Immature Granulocytes: 0 %
LYMPHS ABS: 1.5 10*3/uL (ref 0.7–3.1)
Lymphs: 28 %
MCH: 31.1 pg (ref 26.6–33.0)
MCHC: 34.2 g/dL (ref 31.5–35.7)
MCV: 91 fL (ref 79–97)
MONOS ABS: 0.4 10*3/uL (ref 0.1–0.9)
Monocytes: 6 %
NEUTROS ABS: 3.5 10*3/uL (ref 1.4–7.0)
Neutrophils: 65 %
PLATELETS: 162 10*3/uL (ref 150–379)
RBC: 4.69 x10E6/uL (ref 4.14–5.80)
RDW: 14.4 % (ref 12.3–15.4)
WBC: 5.5 10*3/uL (ref 3.4–10.8)

## 2017-06-30 ENCOUNTER — Telehealth: Payer: Self-pay | Admitting: *Deleted

## 2017-06-30 NOTE — Telephone Encounter (Signed)
Called pt - no answer; left message "CBC normal " per Dr Beryle Beams. And call for any questions.

## 2017-06-30 NOTE — Telephone Encounter (Signed)
-----   Message from Annia Belt, MD sent at 06/29/2017  6:37 AM EDT ----- Call pt: CBC normal

## 2017-09-27 ENCOUNTER — Other Ambulatory Visit (INDEPENDENT_AMBULATORY_CARE_PROVIDER_SITE_OTHER): Payer: Medicare Other

## 2017-09-27 DIAGNOSIS — C9141 Hairy cell leukemia, in remission: Secondary | ICD-10-CM | POA: Diagnosis not present

## 2017-09-28 LAB — CBC WITH DIFFERENTIAL/PLATELET
Basophils Absolute: 0 10*3/uL (ref 0.0–0.2)
Basos: 0 %
EOS (ABSOLUTE): 0 10*3/uL (ref 0.0–0.4)
EOS: 1 %
HEMATOCRIT: 43.8 % (ref 37.5–51.0)
Hemoglobin: 14.6 g/dL (ref 13.0–17.7)
Immature Grans (Abs): 0 10*3/uL (ref 0.0–0.1)
Immature Granulocytes: 0 %
LYMPHS ABS: 1.7 10*3/uL (ref 0.7–3.1)
Lymphs: 35 %
MCH: 31.1 pg (ref 26.6–33.0)
MCHC: 33.3 g/dL (ref 31.5–35.7)
MCV: 93 fL (ref 79–97)
MONOS ABS: 0.3 10*3/uL (ref 0.1–0.9)
Monocytes: 6 %
Neutrophils Absolute: 2.9 10*3/uL (ref 1.4–7.0)
Neutrophils: 58 %
Platelets: 168 10*3/uL (ref 150–450)
RBC: 4.7 x10E6/uL (ref 4.14–5.80)
RDW: 14.3 % (ref 12.3–15.4)
WBC: 4.9 10*3/uL (ref 3.4–10.8)

## 2017-09-29 ENCOUNTER — Telehealth: Payer: Self-pay | Admitting: *Deleted

## 2017-09-29 NOTE — Telephone Encounter (Signed)
-----   Message from Annia Belt, MD sent at 09/28/2017 11:59 AM EDT ----- Call pt: CBC normal

## 2017-09-29 NOTE — Telephone Encounter (Signed)
Pt called / informed "CBC normal" per Dr Kathyrn Drown. Stated thank you for calling.

## 2017-11-01 ENCOUNTER — Ambulatory Visit: Payer: Medicare Other | Admitting: Urology

## 2017-11-01 DIAGNOSIS — N401 Enlarged prostate with lower urinary tract symptoms: Secondary | ICD-10-CM

## 2017-11-01 DIAGNOSIS — R972 Elevated prostate specific antigen [PSA]: Secondary | ICD-10-CM

## 2017-11-01 DIAGNOSIS — R350 Frequency of micturition: Secondary | ICD-10-CM | POA: Diagnosis not present

## 2017-12-05 ENCOUNTER — Encounter: Payer: Self-pay | Admitting: Oncology

## 2017-12-05 ENCOUNTER — Ambulatory Visit: Payer: Medicare Other | Admitting: Oncology

## 2017-12-05 ENCOUNTER — Other Ambulatory Visit: Payer: Self-pay

## 2017-12-05 VITALS — BP 125/66 | HR 68 | Temp 98.4°F | Ht 72.0 in | Wt 183.7 lb

## 2017-12-05 DIAGNOSIS — Z880 Allergy status to penicillin: Secondary | ICD-10-CM

## 2017-12-05 DIAGNOSIS — C9141 Hairy cell leukemia, in remission: Secondary | ICD-10-CM

## 2017-12-05 NOTE — Progress Notes (Signed)
Hematology and Oncology Follow Up Visit  Cameron Wu [id number] [birth year] 67 y.o. 12/05/2017 6:56 PM   Principle Diagnosis: Encounter Diagnosis  Name Primary?  . Hairy cell leukemia, in remission [facility]) Yes  Clinical summary: Pleasant 67 year old man in his third remission from hairy cell leukemia initially diagnosed in January 1992 when he presented with constitutional symptoms and massive splenomegaly. He was treated with cladribine by a special exception protocol sent to [facility] from the [facility] before the drug was officially FDA approved. He had his first progression in July of 1995. He again achieved a complete response with another course of cladribine. He had another progression in June 2007. He was treated with a combination of cladribine plus Rituxan. He has remained in remission since that time.   Interim History: He continues to do well.  Now out an amazing 12 years from most recent relapse. He has no constitutional symptoms.  No interim medical problems.  No infections.  Medications: reviewed  Allergies:  Allergies  Allergen Reactions  . Penicillins Swelling    Review of Systems: See interim history Remaining ROS negative:   Physical Exam: Blood pressure 125/66, pulse 68, temperature 98.4 F (36.9 C), temperature source Oral, height 6' (1.829 m), weight 183 lb 11.2 oz (83.3 kg), SpO2 100 %. Wt Readings from Last 3 Encounters:  12/05/17 183 lb 11.2 oz (83.3 kg)  12/07/16 189 lb (85.7 kg)  11/17/15 192 lb 6.4 oz (87.3 kg)     General appearance: Well-nourished Caucasian man HENNT: Pharynx no erythema, exudate, mass, or ulcer. No thyromegaly or thyroid nodules Lymph nodes: No cervical, supraclavicular, or axillary lymphadenopathy Breasts:  Lungs: Clear to auscultation, resonant to percussion throughout Heart: Regular rhythm, no murmur, no gallop, no rub, no click, no edema Abdomen: Soft, nontender, normal bowel sounds, no mass, no  organomegaly Extremities: No edema, no calf tenderness Musculoskeletal: no joint deformities GU:  Vascular: Carotid pulses 2+, no bruits, distal pulses:  Neurologic: Alert, oriented, PERRLA, cranial nerves grossly normal, motor strength 5 over 5, reflexes 1+ symmetric, upper body coordination normal, gait normal, Skin: No rash or ecchymosis  Lab Results: CBC W/Diff    Component Value Date/Time   WBC 4.9 09/27/2017 1029   WBC 5.4 11/29/2016 1009   RBC 4.70 09/27/2017 1029   RBC 4.64 11/29/2016 1009   HGB 14.6 09/27/2017 1029   HGB 14.5 07/11/2013 1028   HCT 43.8 09/27/2017 1029   HCT 43.0 07/11/2013 1028   PLT 168 09/27/2017 1029   MCV 93 09/27/2017 1029   MCV 90.1 07/11/2013 1028   MCH 31.1 09/27/2017 1029   MCH 30.4 11/29/2016 1009   MCHC 33.3 09/27/2017 1029   MCHC 33.7 11/29/2016 1009   RDW 14.3 09/27/2017 1029   RDW 13.4 07/11/2013 1028   LYMPHSABS 1.7 09/27/2017 1029   LYMPHSABS 1.4 07/11/2013 1028   MONOABS 0.4 11/29/2016 1009   MONOABS 0.3 07/11/2013 1028   EOSABS 0.0 09/27/2017 1029   BASOSABS 0.0 09/27/2017 1029   BASOSABS 0.0 07/11/2013 1028     Chemistry      Component Value Date/Time   NA 145 (H) 12/28/2016 0957   NA 143 03/12/2013 1320   K 4.2 12/28/2016 0957   K 4.0 03/12/2013 1320   CL 104 12/28/2016 0957   CL 107 03/06/2012 1439   CO2 26 12/28/2016 0957   CO2 28 03/12/2013 1320   BUN 13 12/28/2016 0957   BUN 18.2 03/12/2013 1320   CREATININE 1.20 12/28/2016 0957  CREATININE 1.04 11/12/2014 1121   CREATININE 1.0 03/12/2013 1320   GLU 103 10/05/2010      Component Value Date/Time   CALCIUM 9.2 12/28/2016 0957   CALCIUM 9.2 03/12/2013 1320   ALKPHOS 92 12/28/2016 0957   ALKPHOS 90 03/12/2013 1320   AST 29 12/28/2016 0957   AST 26 03/12/2013 1320   ALT 36 12/28/2016 0957   ALT 46 03/12/2013 1320   BILITOT 0.5 12/28/2016 0957   BILITOT 0.39 03/12/2013 1320    Recent CBC September 27, 2017: Hemoglobin 14.6, hematocrit 44, white count 4900, 58%  neutrophils, 35% lymphocytes, platelets 168,000. Repeat lab today pending   Radiological Studies: No results found.  Impression:  1.  Hairy cell leukemia in third un maintained remission now over 12 years and out almost 30 years from initial diagnosis in 1992. Continue periodic clinical and lab monitoring.  I will see him one more time in 6 months then transition his hematology care to Dr.Kale at the [facility].   CC: Patient Care Team: [name], MD as PCP - General (Internal Medicine)   Murriel Hopper, MD, [place]  Hematology-Oncology/Internal Medicine     8/19/20196:56 PM

## 2017-12-05 NOTE — Patient Instructions (Signed)
To lab today Continue every 3 month CBC,diff Return visit 6 months  Please schedule new pt visit with Dr Irene Limbo for April, 2020: Hairy cell leukemia in 3rd remission on no Rx

## 2017-12-06 ENCOUNTER — Telehealth: Payer: Self-pay | Admitting: *Deleted

## 2017-12-06 LAB — CBC WITH DIFFERENTIAL/PLATELET
BASOS ABS: 0 10*3/uL (ref 0.0–0.2)
Basos: 0 %
EOS (ABSOLUTE): 0.1 10*3/uL (ref 0.0–0.4)
Eos: 1 %
Hematocrit: 42.2 % (ref 37.5–51.0)
Hemoglobin: 14.1 g/dL (ref 13.0–17.7)
IMMATURE GRANULOCYTES: 0 %
Immature Grans (Abs): 0 10*3/uL (ref 0.0–0.1)
Lymphocytes Absolute: 1.6 10*3/uL (ref 0.7–3.1)
Lymphs: 27 %
MCH: 30.5 pg (ref 26.6–33.0)
MCHC: 33.4 g/dL (ref 31.5–35.7)
MCV: 91 fL (ref 79–97)
Monocytes Absolute: 0.4 10*3/uL (ref 0.1–0.9)
Monocytes: 8 %
NEUTROS PCT: 64 %
Neutrophils Absolute: 3.6 10*3/uL (ref 1.4–7.0)
PLATELETS: 181 10*3/uL (ref 150–450)
RBC: 4.62 x10E6/uL (ref 4.14–5.80)
RDW: 14.1 % (ref 12.3–15.4)
WBC: 5.7 10*3/uL (ref 3.4–10.8)

## 2017-12-06 LAB — COMPREHENSIVE METABOLIC PANEL
A/G RATIO: 2 (ref 1.2–2.2)
ALBUMIN: 4.1 g/dL (ref 3.6–4.8)
ALT: 47 IU/L — AB (ref 0–44)
AST: 36 IU/L (ref 0–40)
Alkaline Phosphatase: 78 IU/L (ref 39–117)
BUN / CREAT RATIO: 14 (ref 10–24)
BUN: 15 mg/dL (ref 8–27)
Bilirubin Total: 0.4 mg/dL (ref 0.0–1.2)
CALCIUM: 9.3 mg/dL (ref 8.6–10.2)
CO2: 26 mmol/L (ref 20–29)
Chloride: 103 mmol/L (ref 96–106)
Creatinine, Ser: 1.06 mg/dL (ref 0.76–1.27)
GFR calc non Af Amer: 72 mL/min/{1.73_m2} (ref >59)
GFR, EST AFRICAN AMERICAN: 84 mL/min/{1.73_m2} (ref >59)
GLUCOSE: 86 mg/dL (ref 65–99)
Globulin, Total: 2.1 g/dL (ref 1.5–4.5)
Potassium: 4.7 mmol/L (ref 3.5–5.2)
Sodium: 142 mmol/L (ref 134–144)
TOTAL PROTEIN: 6.2 g/dL (ref 6.0–8.5)

## 2017-12-06 LAB — LACTATE DEHYDROGENASE: LDH: 162 IU/L (ref 121–224)

## 2017-12-06 NOTE — Telephone Encounter (Signed)
Pt called / informed "lab all good " per Dr Beryle Beams. Stated thank you for calling.

## 2017-12-06 NOTE — Telephone Encounter (Signed)
-----   Message from Annia Belt, MD sent at 12/06/2017  8:13 AM EDT ----- Call pt: lab all good

## 2018-01-10 ENCOUNTER — Other Ambulatory Visit: Payer: Self-pay | Admitting: Oncology

## 2018-01-10 DIAGNOSIS — C9141 Hairy cell leukemia, in remission: Secondary | ICD-10-CM

## 2018-01-20 ENCOUNTER — Telehealth: Payer: Self-pay | Admitting: Oncology

## 2018-01-20 NOTE — Telephone Encounter (Signed)
Faxed medical records to [facility], Release ID: [id number]

## 2018-04-03 ENCOUNTER — Other Ambulatory Visit (INDEPENDENT_AMBULATORY_CARE_PROVIDER_SITE_OTHER): Payer: Medicare Other

## 2018-04-03 DIAGNOSIS — C9141 Hairy cell leukemia, in remission: Secondary | ICD-10-CM

## 2018-04-04 ENCOUNTER — Telehealth: Payer: Self-pay | Admitting: *Deleted

## 2018-04-04 LAB — CBC WITH DIFFERENTIAL/PLATELET
BASOS: 1 %
Basophils Absolute: 0 10*3/uL (ref 0.0–0.2)
EOS (ABSOLUTE): 0.1 10*3/uL (ref 0.0–0.4)
EOS: 1 %
HEMATOCRIT: 41.8 % (ref 37.5–51.0)
Hemoglobin: 14.3 g/dL (ref 13.0–17.7)
IMMATURE GRANS (ABS): 0 10*3/uL (ref 0.0–0.1)
IMMATURE GRANULOCYTES: 0 %
LYMPHS: 28 %
Lymphocytes Absolute: 1.5 10*3/uL (ref 0.7–3.1)
MCH: 30.8 pg (ref 26.6–33.0)
MCHC: 34.2 g/dL (ref 31.5–35.7)
MCV: 90 fL (ref 79–97)
Monocytes Absolute: 0.5 10*3/uL (ref 0.1–0.9)
Monocytes: 8 %
NEUTROS ABS: 3.5 10*3/uL (ref 1.4–7.0)
NEUTROS PCT: 62 %
Platelets: 201 10*3/uL (ref 150–450)
RBC: 4.64 x10E6/uL (ref 4.14–5.80)
RDW: 13.1 % (ref 12.3–15.4)
WBC: 5.6 10*3/uL (ref 3.4–10.8)

## 2018-04-04 NOTE — Telephone Encounter (Signed)
-----   Message from Annia Belt, MD sent at 04/04/2018  9:32 AM EST ----- Call - CBC normal

## 2018-04-04 NOTE — Telephone Encounter (Signed)
Pt called / informed " CBC normal " per Dr Beryle Beams. Stated Merry Christmas and thanks for calling.

## 2018-04-27 ENCOUNTER — Ambulatory Visit (INDEPENDENT_AMBULATORY_CARE_PROVIDER_SITE_OTHER): Payer: Self-pay

## 2018-04-27 ENCOUNTER — Ambulatory Visit (INDEPENDENT_AMBULATORY_CARE_PROVIDER_SITE_OTHER): Payer: Medicare Other | Admitting: Orthopaedic Surgery

## 2018-04-27 ENCOUNTER — Encounter (INDEPENDENT_AMBULATORY_CARE_PROVIDER_SITE_OTHER): Payer: Self-pay | Admitting: Orthopaedic Surgery

## 2018-04-27 ENCOUNTER — Ambulatory Visit (INDEPENDENT_AMBULATORY_CARE_PROVIDER_SITE_OTHER): Payer: Medicare Other

## 2018-04-27 VITALS — BP 132/77 | HR 66 | Ht 72.0 in | Wt 188.0 lb

## 2018-04-27 DIAGNOSIS — M7521 Bicipital tendinitis, right shoulder: Secondary | ICD-10-CM

## 2018-04-27 DIAGNOSIS — M25511 Pain in right shoulder: Secondary | ICD-10-CM | POA: Diagnosis not present

## 2018-04-27 DIAGNOSIS — M25561 Pain in right knee: Secondary | ICD-10-CM

## 2018-04-27 NOTE — Progress Notes (Signed)
Office Visit Note   Patient: Cameron Wu           Date of Birth: Dec 22, 1950           MRN: [id number] Visit Date: 04/27/2018              Requested by: [name], MD [address] [place], [place] [address] PCP: [name], MD   Assessment & Plan: Visit Diagnoses:  1. Acute pain of right shoulder   2. Acute pain of right knee   3. Biceps tendinitis of right shoulder     Plan: Right anterior shoulder injection performed around the biceps tendon with good relief of pain.  We will see how he does with the shoulder and knee if he has increased symptoms we can consider diagnostic MRI imaging as indicated.  Follow-Up Instructions: No follow-ups on file.   Orders:  Orders Placed This Encounter  Procedures  . Large Joint Inj: R glenohumeral  . XR Knee 1-2 Views Right  . XR Shoulder Right   No orders of the defined types were placed in this encounter.     Procedures: Large Joint Inj: R glenohumeral on 04/27/2018 10:36 AM Indications: pain Details: 22 G 1.5 in needle  Arthrogram: No  Medications: 4 mL bupivacaine 0.25 %; 40 mg methylPREDNISolone acetate 40 MG/ML; 0.5 mL lidocaine 1 %; 2 mL bupivacaine 0.25 % Outcome: tolerated well, no immediate complications Procedure, treatment alternatives, risks and benefits explained, specific risks discussed. Consent was given by the patient. Immediately prior to procedure a time out was called to verify the correct patient, procedure, equipment, support staff and site/side marked as required. Patient was prepped and draped in the usual sterile fashion.       Clinical Data: No additional findings.   Subjective: Chief Complaint  Patient presents with  . Right Shoulder - Pain    DOI 03/15/18  . Right Knee - Pain    DOI 03/15/18    HPI 68 year old male referred by Dr. [name] for evaluation of right shoulder pain and right knee pain.  Patient states he was getting out of a car slipped and twisted his knee getting  out of the car with right knee pain.  He has some stiffness pain primarily posteriorly and states is rated at 5 out of 10.  At the same time he twisted his knee reach with his right arm outstretched grab the door and tried to pull and hold himself up and felt sharp anterior pain in his shoulder.  He felt a pop did not notice any bruising but had trouble raising his arm over his head.  This incident occurred on March 15, 2018.  He is used ibuprofen IcyHot and has had persistent right shoulder anterior pain.  He denies numbness or tingling in his hand no associated neck pain.  No previous seizure on his right knee or right shoulder.  Shoulder is comfortable as long as is down at his side.  Pain with pulling and outstretched reaching or overhead activity.  He has not noted any decreased range of motion.  Review of Systems former smoker quit 2007.  Positive for acid reflux.  Treatment for hairy cell leukemia, mononucleosis in the past.  Positive for IBS, BPH, otherwise -14 point systems as apparent pertains HPI.   Objective: Vital Signs: BP 132/77   Pulse 66   Ht 6' (1.829 m)   Wt 188 lb (85.3 kg)   BMI 25.50 kg/m   Physical Exam Constitutional:  Appearance: He is well-developed.  HENT:     Head: Normocephalic and atraumatic.  Eyes:     Pupils: Pupils are equal, round, and reactive to light.  Neck:     Thyroid: No thyromegaly.     Trachea: No tracheal deviation.  Cardiovascular:     Rate and Rhythm: Normal rate.  Pulmonary:     Effort: Pulmonary effort is normal.     Breath sounds: No wheezing.  Abdominal:     General: Bowel sounds are normal.     Palpations: Abdomen is soft.  Skin:    General: Skin is warm and dry.     Capillary Refill: Capillary refill takes less than 2 seconds.  Neurological:     Mental Status: He is alert and oriented to person, place, and time.  Psychiatric:        Behavior: Behavior normal.        Thought Content: Thought content normal.         Judgment: Judgment normal.     Ortho Exam good cervical range of motion no brachial plexus tenderness upper extremity reflexes are 2+ and symmetrical.  Exquisite tenderness over the right long head of the biceps tendon without distal biceps migration.  Distal bicep tendon is normal.  Pain with impingement test positive Hawkins.  Negative drop arm test but he does have some reproduction of pain with the empty can test.  Opposite shoulder shows full range of motion no impingement.  Right knee shows no swelling mild tenderness posterior medial joint line mild discomfort with hyperextension he is amatory without limping.  Normal hip range of motion reflexes are 2+.  Specialty Comments:  No specialty comments available.  Imaging: No results found.   PMFS History: Patient Active Problem List   Diagnosis Date Noted  . Hairy cell leukemia, in remission ([place]) 05/03/2011  . IBS (irritable bowel syndrome) 05/03/2011  . BPH (benign prostatic hypertrophy) 05/03/2011  . Abnormal liver function tests 05/03/2011   Past Medical History:  Diagnosis Date  . Abnormal liver function tests 05/03/2011  . BPH (benign prostatic hypertrophy) 05/03/2011  . Elevated transaminase level   . GERD (gastroesophageal reflux disease)   . Hairy cell leukemia   . Hairy cell leukemia    1992  . Hairy cell leukemia, in remission ([place]) 05/03/2011  . IBS (irritable bowel syndrome) 05/03/2011  . Irritable bowel syndrome    2 yrs ago, states he had inflammation around anus. Biopsy: inclusive    No family history on file.  Past Surgical History:  Procedure Laterality Date  . BONE MARROW BIOPSY     2007  . COLONOSCOPY  08   with polyps  . COLONOSCOPY  09/30/2011   Procedure: COLONOSCOPY;  Surgeon: Rogene Houston, MD;  [facility];  Service: Endoscopy;  Laterality: N/A;  730  . HX COLON POLYPS    . TONSILLECTOMY     childhood   Social History   Occupational History  . Not on file  Tobacco Use  .  Smoking status: Former Smoker    Types: Cigarettes    Last attempt to quit: 12/18/2005    Years since quitting: 12.3  . Smokeless tobacco: Never Used  Substance and Sexual Activity  . Alcohol use: Yes    Alcohol/week: 4.0 standard drinks    Types: 4 Cans of beer per week    Comment: social  . Drug use: No  . Sexual activity: Not on file

## 2018-04-28 MED ORDER — BUPIVACAINE HCL 0.25 % IJ SOLN
2.0000 mL | INTRAMUSCULAR | Status: AC | PRN
  Administered 2018-04-27: 2 mL via INTRA_ARTICULAR

## 2018-04-28 MED ORDER — BUPIVACAINE HCL 0.25 % IJ SOLN
4.0000 mL | INTRAMUSCULAR | Status: AC | PRN
  Administered 2018-04-27: 4 mL via INTRA_ARTICULAR

## 2018-04-28 MED ORDER — LIDOCAINE HCL 1 % IJ SOLN
0.5000 mL | INTRAMUSCULAR | Status: AC | PRN
  Administered 2018-04-27: .5 mL

## 2018-04-28 MED ORDER — METHYLPREDNISOLONE ACETATE 40 MG/ML IJ SUSP
40.0000 mg | INTRAMUSCULAR | Status: AC | PRN
  Administered 2018-04-27: 40 mg via INTRA_ARTICULAR

## 2018-06-05 ENCOUNTER — Ambulatory Visit: Payer: Medicare Other | Admitting: Oncology

## 2018-06-05 ENCOUNTER — Encounter: Payer: Self-pay | Admitting: Oncology

## 2018-06-05 ENCOUNTER — Other Ambulatory Visit: Payer: Self-pay

## 2018-06-05 VITALS — BP 140/70 | HR 74 | Temp 98.1°F | Ht 72.0 in | Wt 191.0 lb

## 2018-06-05 DIAGNOSIS — Z880 Allergy status to penicillin: Secondary | ICD-10-CM

## 2018-06-05 DIAGNOSIS — C9141 Hairy cell leukemia, in remission: Secondary | ICD-10-CM | POA: Diagnosis not present

## 2018-06-05 NOTE — Patient Instructions (Signed)
We have mad a referral to Dr Benay Spice at [facility] for your ongoing Hematology care. Please call [phone]  for an appointment if you don't hear anything by the end of this month.  It has been a pleasure working with you!!

## 2018-06-05 NOTE — Progress Notes (Signed)
Hematology and Oncology Follow Up Visit  Cameron Wu [id number] September 20, 1950 68 y.o. 06/05/2018 12:41 PM   Principle Diagnosis: Encounter Diagnosis  Name Primary?  . Hairy cell leukemia, in remission [facility]) Yes  Clinical summary: Pleasant 68 year old man in his third remission from hairy cell leukemia initially diagnosed in January 1992 when he presented with constitutional symptoms and massive splenomegaly. He was treated with cladribine by a special exception protocol sent to [facility] from the [facility] before the drug was officially FDA approved. He had his first progression in July of 1995. He again achieved a complete response with another course of cladribine. He had another progression in June 2007. He was treated with a combination of cladribine plus Rituxan. He has remained in remission since that time.  Interim History: He continues to do well.  No interim medical problems. Blood counts show ongoing remission status.   He is considering having a surgical procedure on his prostate so he does not have to keep taking medication. He has had no flareups of intermittent bowel problems that he has had in the past. He denied any recent infections. He is enjoying his retirement.  Medications: reviewed  Allergies:  Allergies  Allergen Reactions  . Penicillins Swelling    Review of Systems: See interim history  remaining ROS negative:   Physical Exam: Blood pressure 140/70, pulse 74, temperature 98.1 F (36.7 C), temperature source Oral, height 6' (1.829 m), weight 191 lb (86.6 kg), SpO2 97 %. Wt Readings from Last 3 Encounters:  06/05/18 191 lb (86.6 kg)  04/27/18 188 lb (85.3 kg)  12/05/17 183 lb 11.2 oz (83.3 kg)     General appearance: Well-nourished Caucasian man HENNT: Pharynx no erythema, exudate, mass, or ulcer. No thyromegaly or thyroid nodules Lymph nodes: No cervical, supraclavicular, or axillary lymphadenopathy Breasts:  Lungs: Clear to  auscultation, resonant to percussion throughout Heart: Regular rhythm, no murmur, no gallop, no rub, no click, no edema Abdomen: Soft, nontender, normal bowel sounds, no mass, no organomegaly Extremities: No edema, no calf tenderness Musculoskeletal: no joint deformities GU:  Vascular: Carotid pulses 2+, no bruits,  Neurologic: Alert, oriented, PERRLA,  cranial nerves grossly normal, motor strength 5 over 5, reflexes 1+ symmetric, upper body coordination normal, gait normal, Skin: No rash or ecchymosis  Lab Results: CBC W/Diff    Component Value Date/Time   WBC 5.6 04/03/2018 1033   WBC 5.4 11/29/2016 1009   RBC 4.64 04/03/2018 1033   RBC 4.64 11/29/2016 1009   HGB 14.3 04/03/2018 1033   HGB 14.5 07/11/2013 1028   HCT 41.8 04/03/2018 1033   HCT 43.0 07/11/2013 1028   PLT 201 04/03/2018 1033   MCV 90 04/03/2018 1033   MCV 90.1 07/11/2013 1028   MCH 30.8 04/03/2018 1033   MCH 30.4 11/29/2016 1009   MCHC 34.2 04/03/2018 1033   MCHC 33.7 11/29/2016 1009   RDW 13.1 04/03/2018 1033   RDW 13.4 07/11/2013 1028   LYMPHSABS 1.5 04/03/2018 1033   LYMPHSABS 1.4 07/11/2013 1028   MONOABS 0.4 11/29/2016 1009   MONOABS 0.3 07/11/2013 1028   EOSABS 0.1 04/03/2018 1033   BASOSABS 0.0 04/03/2018 1033   BASOSABS 0.0 07/11/2013 1028     Chemistry      Component Value Date/Time   NA 142 12/05/2017 1150   NA 143 03/12/2013 1320   K 4.7 12/05/2017 1150   K 4.0 03/12/2013 1320   CL 103 12/05/2017 1150   CL 107 03/06/2012 1439   CO2 26  12/05/2017 1150   CO2 28 03/12/2013 1320   BUN 15 12/05/2017 1150   BUN 18.2 03/12/2013 1320   CREATININE 1.06 12/05/2017 1150   CREATININE 1.04 11/12/2014 1121   CREATININE 1.0 03/12/2013 1320   GLU 103 10/05/2010      Component Value Date/Time   CALCIUM 9.3 12/05/2017 1150   CALCIUM 9.2 03/12/2013 1320   ALKPHOS 78 12/05/2017 1150   ALKPHOS 90 03/12/2013 1320   AST 36 12/05/2017 1150   AST 26 03/12/2013 1320   ALT 47 (H) 12/05/2017 1150   ALT  46 03/12/2013 1320   BILITOT 0.4 12/05/2017 1150   BILITOT 0.39 03/12/2013 1320       Radiological Studies: No results found.  Impression:  Hairy cell leukemia in third un maintained remission now almost 13 years from most recent progression and out almost 30 years from initial diagnosis in 1992.    CC: Patient Care Team: [name], MD as PCP - General (Internal Medicine)   Murriel Hopper, MD, [place]  Hematology-Oncology/Internal Medicine     2/17/202012:41 PM

## 2018-06-14 ENCOUNTER — Encounter: Payer: Self-pay | Admitting: *Deleted

## 2018-06-29 ENCOUNTER — Encounter: Payer: Self-pay | Admitting: Hematology

## 2018-06-29 ENCOUNTER — Telehealth: Payer: Self-pay | Admitting: Hematology

## 2018-06-29 NOTE — Telephone Encounter (Signed)
Received a call from Mr. Cameron Wu to schedule a new pt appt w/Dr. Irene Limbo. Mr. Cameron Wu is a former pt of Dr. Beryle Beams and has been scheduled to see Dr. Irene Limbo on 4/13 at 11am with labs at 1030am. Aware to arrive 15 minutes early. Letter mailed.

## 2018-07-03 ENCOUNTER — Other Ambulatory Visit (INDEPENDENT_AMBULATORY_CARE_PROVIDER_SITE_OTHER): Payer: Medicare Other

## 2018-07-03 ENCOUNTER — Other Ambulatory Visit: Payer: Self-pay

## 2018-07-03 DIAGNOSIS — C9141 Hairy cell leukemia, in remission: Secondary | ICD-10-CM

## 2018-07-04 ENCOUNTER — Telehealth: Payer: Self-pay | Admitting: *Deleted

## 2018-07-04 LAB — CBC WITH DIFFERENTIAL/PLATELET
BASOS: 1 %
Basophils Absolute: 0 10*3/uL (ref 0.0–0.2)
EOS (ABSOLUTE): 0 10*3/uL (ref 0.0–0.4)
Eos: 1 %
HEMOGLOBIN: 14.2 g/dL (ref 13.0–17.7)
Hematocrit: 40.2 % (ref 37.5–51.0)
Immature Grans (Abs): 0 10*3/uL (ref 0.0–0.1)
Immature Granulocytes: 0 %
LYMPHS ABS: 1.5 10*3/uL (ref 0.7–3.1)
Lymphs: 25 %
MCH: 31.8 pg (ref 26.6–33.0)
MCHC: 35.3 g/dL (ref 31.5–35.7)
MCV: 90 fL (ref 79–97)
MONOCYTES: 7 %
Monocytes Absolute: 0.4 10*3/uL (ref 0.1–0.9)
NEUTROS ABS: 4.2 10*3/uL (ref 1.4–7.0)
Neutrophils: 66 %
Platelets: 174 10*3/uL (ref 150–450)
RBC: 4.47 x10E6/uL (ref 4.14–5.80)
RDW: 13.3 % (ref 11.6–15.4)
WBC: 6.2 10*3/uL (ref 3.4–10.8)

## 2018-07-04 NOTE — Telephone Encounter (Signed)
Pt called / informed "CBC remains normal " per Dr Beryle Beams.

## 2018-07-04 NOTE — Telephone Encounter (Signed)
-----   Message from Annia Belt, MD sent at 07/04/2018  8:14 AM EDT ----- Call pt: CBC remains normal

## 2018-07-28 NOTE — Progress Notes (Signed)
HEMATOLOGY/ONCOLOGY CONSULTATION NOTE  Date of Service: 07/31/2018  Patient Care Team: [name], MD as PCP - General (Internal Medicine)  CHIEF COMPLAINTS/PURPOSE OF CONSULTATION:  Hairy Cell Leukemia  Oncologic History:   Cameron Wu was initially diagnosed with Hairy Cell Leukemia in January 1992, at which time he presented with constitutional symptoms and massive splenomegaly. He was treated with Cladribine by special exception protocol from the [facility] before the drug was officially FDA approved. He had progression in July 1995, and ws treated again with Cladribine. The pt then progressed again in June 2007, and was treated with Cladribine and Rituxan. He has remained in remission since that time.  HISTORY OF PRESENTING ILLNESS:   Cameron Wu is a wonderful 68 y.o. male who has been referred to [place] by Dr. Murriel Hopper for evaluation and management of his Hairy Cell Leukemia. The pt reports that he is doing well overall.  The pt reports that he has not developed any new concerns since his last visit with Dr. Beryle Beams a month ago. He denies abdominal pains or worsened energy levels, fevers, chills, night sweats, or unexpected weight loss. The pt notes that he has some chronic right shoulder and knee pain, residual from a fall about a year ago. He denies concerns for recent infections. He has followed with his PCP for is flu and pneumonia vaccinations.  The pt notes that each time he received Cladribine, he received a dose each day for 5 consecutive days in an outpatient setting. The pt denies any major side effects from the chemotherapy, but did have cytopenias.   The pt notes that at his last relapse in 2007, he did not have significant symptoms, but was seen to develop abnormal blood counts, and was confirmed with a BM Bx.  He denies swallowing problems, heart issues, lung issues, or liver problems. He notes that he has a "nervous stomach which  growls for hours after eating." He sees Dr. Diona Fanti in Urology for his BPH and takes Finasteride. The pt notes social alcohol use and denies ever being drunk.  Most recent lab results (07/03/18) of CBC w/diff is as follows: all values are WNL.  On review of systems, pt reports good energy levels, eating well, stable weight, and denies fevers, chills, night sweats, unexpected weight loss, swallowing difficulty, breathing difficulty, leg swelling, skin rashes or lesions, concern for infections, mouth sores, pain along the spine, lower abdominal pains, leg swelling, noticing any new lumps or bumps, and any other symptoms.   On PMHx the pt reports Hairy cell leukemia, environmental allergies  MEDICAL HISTORY:  Past Medical History:  Diagnosis Date  . Abnormal liver function tests 05/03/2011  . BPH (benign prostatic hypertrophy) 05/03/2011  . Elevated transaminase level   . GERD (gastroesophageal reflux disease)   . Hairy cell leukemia   . Hairy cell leukemia    1992  . Hairy cell leukemia, in remission ([place]) 05/03/2011  . IBS (irritable bowel syndrome) 05/03/2011  . Irritable bowel syndrome    2 yrs ago, states he had inflammation around anus. Biopsy: inclusive    SURGICAL HISTORY: Past Surgical History:  Procedure Laterality Date  . BONE MARROW BIOPSY     2007  . COLONOSCOPY  08   with polyps  . COLONOSCOPY  09/30/2011   Procedure: COLONOSCOPY;  Surgeon: Rogene Houston, MD;  Location: [facility];  Service: Endoscopy;  Laterality: N/A;  730  . HX COLON POLYPS    . TONSILLECTOMY  childhood    SOCIAL HISTORY: Social History   Socioeconomic History  . Marital status: Married    Spouse name: Not on file  . Number of children: Not on file  . Years of education: Not on file  . Highest education level: Not on file  Occupational History  . Not on file  Social Needs  . Financial resource strain: Not on file  . Food insecurity:    Worry: Not on file    Inability: Not on file   . Transportation needs:    Medical: Not on file    Non-medical: Not on file  Tobacco Use  . Smoking status: Former Smoker    Types: Cigarettes    Last attempt to quit: 12/18/2005    Years since quitting: 12.6  . Smokeless tobacco: Never Used  Substance and Sexual Activity  . Alcohol use: Yes    Alcohol/week: 4.0 standard drinks    Types: 4 Cans of beer per week    Comment: social  . Drug use: No  . Sexual activity: Not on file  Lifestyle  . Physical activity:    Days per week: Not on file    Minutes per session: Not on file  . Stress: Not on file  Relationships  . Social connections:    Talks on phone: Not on file    Gets together: Not on file    Attends religious service: Not on file    Active member of club or organization: Not on file    Attends meetings of clubs or organizations: Not on file    Relationship status: Not on file  . Intimate partner violence:    Fear of current or ex partner: Not on file    Emotionally abused: Not on file    Physically abused: Not on file    Forced sexual activity: Not on file  Other Topics Concern  . Not on file  Social History Narrative  . Not on file    FAMILY HISTORY: No family history on file.  ALLERGIES:  is allergic to penicillins.  MEDICATIONS:  Current Outpatient Medications  Medication Sig Dispense Refill  . aspirin EC 81 MG tablet Take 81 mg by mouth every other day.     . cimetidine (TAGAMET) 200 MG tablet Take 200 mg by mouth daily as needed. For acid reflux    . diphenhydrAMINE (BENADRYL) 25 MG tablet Take 50 mg by mouth at bedtime as needed.     . finasteride (PROSCAR) 5 MG tablet Take 5 mg by mouth every morning.    Marland Kitchen ibuprofen (ADVIL,MOTRIN) 200 MG tablet Take 200 mg by mouth every 6 (six) hours as needed.    . loperamide (IMODIUM) 2 MG capsule Take 2 mg by mouth as needed.     . loratadine (CLARITIN) 10 MG tablet Take 10 mg by mouth daily as needed. For allergies    . Melatonin 10 MG TABS Take by mouth every  evening.    . Tamsulosin HCl (FLOMAX) 0.4 MG CAPS Take 0.4 mg by mouth at bedtime.      No current facility-administered medications for this visit.     REVIEW OF SYSTEMS:    10 Point review of Systems was done is negative except as noted above.  PHYSICAL EXAMINATION: ECOG PERFORMANCE STATUS: 1 - Symptomatic but completely ambulatory  . Vitals:   07/31/18 1106  BP: (!) 141/70  Pulse: 68  Resp: 18  Temp: 98.3 F (36.8 C)  SpO2: 97%   Filed  Weights   07/31/18 1106  Weight: 188 lb 9.6 oz (85.5 kg)   .Body mass index is 25.58 kg/m.  GENERAL:alert, in no acute distress and comfortable SKIN: no acute rashes, no significant lesions EYES: conjunctiva are pink and non-injected, sclera anicteric OROPHARYNX: MMM, no exudates, no oropharyngeal erythema or ulceration NECK: supple, no JVD LYMPH:  no palpable lymphadenopathy in the cervical, axillary or inguinal regions LUNGS: clear to auscultation b/l with normal respiratory effort HEART: regular rate & rhythm ABDOMEN:  normoactive bowel sounds , non tender, not distended. No palpable hepatosplenomegaly Extremity: no pedal edema PSYCH: alert & oriented x 3 with fluent speech NEURO: no focal motor/sensory deficits  LABORATORY DATA:  I have reviewed the data as listed  . CBC Latest Ref Rng & Units 07/03/2018 04/03/2018 12/05/2017  WBC 3.4 - 10.8 x10E3/uL 6.2 5.6 5.7  Hemoglobin 13.0 - 17.7 g/dL 14.2 14.3 14.1  Hematocrit 37.5 - 51.0 % 40.2 41.8 42.2  Platelets 150 - 450 x10E3/uL 174 201 181    . CMP Latest Ref Rng & Units 12/05/2017 12/28/2016 07/14/2015  Glucose 65 - 99 mg/dL 86 69 71  BUN 8 - 27 mg/dL _0 Creatinine 0.76 - 1.27 mg/dL 1.06 1.20 1.11  Sodium 134 - 144 mmol/L 142 145(H) 142  Potassium 3.5 - 5.2 mmol/L 4.7 4.2 4.3  Chloride 96 - 106 mmol/L 103 104 101  CO2 20 - 29 mmol/L _1 Calcium 8.6 - 10.2 mg/dL 9.3 9.2 9.1  Total Protein 6.0 - 8.5 g/dL 6.2 6.7 6.5  Total Bilirubin 0.0 - 1.2 mg/dL 0.4 0.5 0.6   Alkaline Phos 39 - 117 IU/L 78 92 72  AST 0 - 40 IU/L 36 29 33  ALT 0 - 44 IU/L 47(H) 36 46(H)    12/02/05 BM Bx:   12/02/05 Flow Cytometry:     RADIOGRAPHIC STUDIES: I have personally reviewed the radiological images as listed and agreed with the findings in the report. No results found.  ASSESSMENT & PLAN:   68 y.o. male with  1. Hairy cell leukemia, currently in 3rd remission Diagnosed in January 1992 presented with constitutional symptoms and massive splenomegaly. S/p Cladribine July 1995 progression  S/p Cladribine June 2007 progression S/p Cladribine and Rituxan  PLAN: -Discussed patient's most recent labs from 07/03/18, all blood counts are WNL -Recommend following up with PCP for flu vaccines, Prevnar, Pneumovax and Shingrix. Pt endorses compliance with all of these. -The pt shows no clinical or lab progression/return of his hairy cell leukemia at this time.  -No indication for further treatment at this time. -Continue follow up with Urology and PCP -Recommended that the pt continue to eat well, drink at least 48-64 oz of water each day, and walk 20-30 minutes each day.  -Advised infection prevention strategies, crowd avoidance, and frequent hand washing -Will see the pt in 5 months, sooner if any new concerns    RTC with Dr Irene Limbo with labs in 80month   All of the patients questions were answered with apparent satisfaction. The patient knows to call the clinic with any problems, questions or concerns.  The total time spent in the appt was 40 minutes and more than 50% was on counseling and direct patient cares.    GSullivan LoneMD MRustburgAAHIVMS SGeorge H. O'Brien, Jr. Va Medical CenterCMercy Hospital IndependenceHematology/Oncology Physician CBrainard Surgery Center (Office):       [phone](Work cell):  [phone](Fax):           [phone] 07/31/2018  11:57 AM  I, Baldwin Jamaica, am acting as a scribe for Dr. Sullivan Lone.   .I have reviewed the above documentation for accuracy and completeness, and I agree  with the above. Brunetta Genera MD

## 2018-07-31 ENCOUNTER — Other Ambulatory Visit: Payer: Self-pay

## 2018-07-31 ENCOUNTER — Telehealth: Payer: Self-pay | Admitting: Hematology

## 2018-07-31 ENCOUNTER — Inpatient Hospital Stay: Payer: Medicare Other

## 2018-07-31 ENCOUNTER — Inpatient Hospital Stay: Payer: Medicare Other | Attending: Hematology | Admitting: Hematology

## 2018-07-31 VITALS — BP 141/70 | HR 68 | Temp 98.3°F | Resp 18 | Ht 72.0 in | Wt 188.6 lb

## 2018-07-31 DIAGNOSIS — C9141 Hairy cell leukemia, in remission: Secondary | ICD-10-CM | POA: Diagnosis not present

## 2018-07-31 DIAGNOSIS — Z79899 Other long term (current) drug therapy: Secondary | ICD-10-CM | POA: Insufficient documentation

## 2018-07-31 DIAGNOSIS — Z87891 Personal history of nicotine dependence: Secondary | ICD-10-CM | POA: Insufficient documentation

## 2018-07-31 DIAGNOSIS — Z7982 Long term (current) use of aspirin: Secondary | ICD-10-CM | POA: Insufficient documentation

## 2018-07-31 NOTE — Telephone Encounter (Signed)
Scheduled appt pet 4/13 los.

## 2018-09-14 ENCOUNTER — Encounter (INDEPENDENT_AMBULATORY_CARE_PROVIDER_SITE_OTHER): Payer: Self-pay | Admitting: *Deleted

## 2018-10-26 ENCOUNTER — Other Ambulatory Visit (INDEPENDENT_AMBULATORY_CARE_PROVIDER_SITE_OTHER): Payer: Self-pay | Admitting: Internal Medicine

## 2018-10-26 DIAGNOSIS — Z860100 Personal history of colon polyps, unspecified: Secondary | ICD-10-CM | POA: Insufficient documentation

## 2018-11-14 ENCOUNTER — Ambulatory Visit (INDEPENDENT_AMBULATORY_CARE_PROVIDER_SITE_OTHER): Payer: Medicare Other | Admitting: Urology

## 2018-11-14 DIAGNOSIS — N401 Enlarged prostate with lower urinary tract symptoms: Secondary | ICD-10-CM | POA: Diagnosis not present

## 2018-11-27 ENCOUNTER — Encounter (INDEPENDENT_AMBULATORY_CARE_PROVIDER_SITE_OTHER): Payer: Self-pay | Admitting: *Deleted

## 2018-11-27 ENCOUNTER — Telehealth (INDEPENDENT_AMBULATORY_CARE_PROVIDER_SITE_OTHER): Payer: Self-pay | Admitting: *Deleted

## 2018-11-27 MED ORDER — PEG 3350-KCL-NA BICARB-NACL 420 G PO SOLR: 4000.0000 mL | Freq: Once | ORAL | 0 refills | Status: AC

## 2018-11-27 NOTE — Telephone Encounter (Signed)
Patient needs trilyte TCS sch'd in Sept

## 2018-12-11 ENCOUNTER — Telehealth (INDEPENDENT_AMBULATORY_CARE_PROVIDER_SITE_OTHER): Payer: Self-pay | Admitting: *Deleted

## 2018-12-11 NOTE — Telephone Encounter (Signed)
Referring MD/PCP: fagan   Procedure: tcs  Reason/Indication:  Hx polyps  Has patient had this procedure before?  Yes, 2013  If so, when, by whom and where?    Is there a family history of colon cancer?  no  Who?  What age when diagnosed?    Is patient diabetic?   no      Does patient have prosthetic heart valve or mechanical valve?  no  Do you have a pacemaker/defibrillator?  no  Has patient ever had endocarditis/atrial fibrillation? no  Does patient use oxygen? no  Has patient had joint replacement within last 12 months?  no  Is patient constipated or do they take laxatives? no  Does patient have a history of alcohol/drug use?  no  Is patient on blood thinner such as Coumadin, Plavix and/or Aspirin? yes  Medications: asa 81 mg prn, finasteride 0.5 mg daily, tamsulosin 0.4 mg daily, fish oil daily, osteo bio flex daily, achrcoal daily, metamucil daily, loratadine prn, ibuprofen prn, magnesium prn, melatonin prn  Allergies: pcn  Medication Adjustment per Dr Charlena Cross, NP: asa 2 days  Procedure date & time: 9/24 at 100

## 2018-12-13 ENCOUNTER — Other Ambulatory Visit: Payer: Self-pay

## 2018-12-13 ENCOUNTER — Ambulatory Visit (INDEPENDENT_AMBULATORY_CARE_PROVIDER_SITE_OTHER): Payer: Self-pay

## 2018-12-27 NOTE — Telephone Encounter (Signed)
Okay to schedule colonoscopy with conscious sedation 

## 2019-01-01 ENCOUNTER — Inpatient Hospital Stay: Payer: Medicare Other | Admitting: Hematology

## 2019-01-01 ENCOUNTER — Inpatient Hospital Stay: Payer: Medicare Other

## 2019-01-01 ENCOUNTER — Telehealth: Payer: Self-pay | Admitting: Hematology

## 2019-01-01 NOTE — Telephone Encounter (Signed)
Called patient per staff message.  Appt was scheduled with the wrong provider in error.  Rescheduled appt with patient on the phone on a day that would work best for him.  Patient aware of his new appt date and time.

## 2019-01-09 ENCOUNTER — Other Ambulatory Visit (HOSPITAL_COMMUNITY)
Admission: RE | Admit: 2019-01-09 | Discharge: 2019-01-09 | Disposition: A | Discharge status: Home / Self Care | Payer: Medicare Other | Source: Ambulatory Visit | Attending: Internal Medicine | Admitting: Internal Medicine

## 2019-01-09 DIAGNOSIS — Z20828 Contact with and (suspected) exposure to other viral communicable diseases: Secondary | ICD-10-CM | POA: Diagnosis not present

## 2019-01-09 DIAGNOSIS — Z01812 Encounter for preprocedural laboratory examination: Secondary | ICD-10-CM | POA: Diagnosis not present

## 2019-01-09 LAB — SARS CORONAVIRUS 2 (TAT 6-24 HRS): SARS Coronavirus 2: NEGATIVE

## 2019-01-11 ENCOUNTER — Ambulatory Visit (HOSPITAL_COMMUNITY)
Admission: RE | Admit: 2019-01-11 | Discharge: 2019-01-11 | Disposition: A | Discharge status: Home / Self Care | Payer: Medicare Other | Attending: Internal Medicine | Admitting: Internal Medicine

## 2019-01-11 ENCOUNTER — Encounter (HOSPITAL_COMMUNITY)
Admission: RE | Disposition: A | Discharge status: Home / Self Care | Payer: Self-pay | Source: Home / Self Care | Attending: Internal Medicine

## 2019-01-11 ENCOUNTER — Other Ambulatory Visit: Payer: Self-pay

## 2019-01-11 ENCOUNTER — Encounter (HOSPITAL_COMMUNITY): Payer: Self-pay | Admitting: *Deleted

## 2019-01-11 DIAGNOSIS — Z87891 Personal history of nicotine dependence: Secondary | ICD-10-CM | POA: Insufficient documentation

## 2019-01-11 DIAGNOSIS — Z7982 Long term (current) use of aspirin: Secondary | ICD-10-CM | POA: Insufficient documentation

## 2019-01-11 DIAGNOSIS — K5730 Diverticulosis of large intestine without perforation or abscess without bleeding: Secondary | ICD-10-CM | POA: Diagnosis not present

## 2019-01-11 DIAGNOSIS — Z1211 Encounter for screening for malignant neoplasm of colon: Secondary | ICD-10-CM | POA: Diagnosis present

## 2019-01-11 DIAGNOSIS — Z880 Allergy status to penicillin: Secondary | ICD-10-CM | POA: Insufficient documentation

## 2019-01-11 DIAGNOSIS — Z86010 Personal history of colonic polyps: Secondary | ICD-10-CM | POA: Insufficient documentation

## 2019-01-11 DIAGNOSIS — N400 Enlarged prostate without lower urinary tract symptoms: Secondary | ICD-10-CM | POA: Diagnosis not present

## 2019-01-11 DIAGNOSIS — Z79899 Other long term (current) drug therapy: Secondary | ICD-10-CM | POA: Diagnosis not present

## 2019-01-11 DIAGNOSIS — C9141 Hairy cell leukemia, in remission: Secondary | ICD-10-CM | POA: Insufficient documentation

## 2019-01-11 DIAGNOSIS — K644 Residual hemorrhoidal skin tags: Secondary | ICD-10-CM | POA: Diagnosis not present

## 2019-01-11 DIAGNOSIS — K648 Other hemorrhoids: Secondary | ICD-10-CM | POA: Diagnosis not present

## 2019-01-11 DIAGNOSIS — Z09 Encounter for follow-up examination after completed treatment for conditions other than malignant neoplasm: Secondary | ICD-10-CM | POA: Diagnosis not present

## 2019-01-11 HISTORY — PX: COLONOSCOPY: SHX5424

## 2019-01-11 SURGERY — COLONOSCOPY
Anesthesia: Moderate Sedation

## 2019-01-11 MED ORDER — MIDAZOLAM HCL 5 MG/5ML IJ SOLN
INTRAMUSCULAR | Status: AC
  Filled 2019-01-11: qty 10

## 2019-01-11 MED ORDER — MEPERIDINE HCL 50 MG/ML IJ SOLN
INTRAMUSCULAR | Status: DC | PRN
  Administered 2019-01-11 (×2): 25 mg via INTRAVENOUS

## 2019-01-11 MED ORDER — MEPERIDINE HCL 50 MG/ML IJ SOLN
INTRAMUSCULAR | Status: AC
  Filled 2019-01-11: qty 1

## 2019-01-11 MED ORDER — STERILE WATER FOR IRRIGATION IR SOLN
Status: DC | PRN
  Administered 2019-01-11: 2.5 mL

## 2019-01-11 MED ORDER — SODIUM CHLORIDE 0.9 % IV SOLN
INTRAVENOUS | Status: DC
  Administered 2019-01-11: 09:00:00 via INTRAVENOUS

## 2019-01-11 MED ORDER — MIDAZOLAM HCL 5 MG/5ML IJ SOLN
INTRAMUSCULAR | Status: DC | PRN
  Administered 2019-01-11: 2 mg via INTRAVENOUS
  Administered 2019-01-11: 1 mg via INTRAVENOUS
  Administered 2019-01-11: 2 mg via INTRAVENOUS

## 2019-01-11 NOTE — H&P (Signed)
Cameron Wu is an 68 y.o. male.   Chief Complaint: Patient is here for colonoscopy. HPI: Patient is 68 year old Caucasian male who has a history of colonic adenoma.  This was removed back in April 2008.  Second colonoscopy was in June 2013 and no polyps were found.  He was therefore advised to return in 7 years.  He has no GI symptoms.  His bowels are regular he denies rectal bleeding.  He takes Advil on as-needed basis.  Last aspirin dose was 1 week ago. Personal history significant for hairy cell leukemia.  He states he was last treated in 2007 and has remained in remission. Family history is negative for CRC.  Past Medical History:  Diagnosis Date  . Abnormal liver function tests 05/03/2011  . BPH (benign prostatic hypertrophy) 05/03/2011  . Elevated transaminase level   . GERD (gastroesophageal reflux disease)   .    Cameron Wu Hairy cell leukemia    1992  . Hairy cell leukemia, in remission ([place]) 05/03/2011  . IBS (irritable bowel syndrome) 05/03/2011  . Irritable bowel syndrome    2 yrs ago, states he had inflammation around anus. Biopsy: inclusive    Past Surgical History:  Procedure Laterality Date  . BONE MARROW BIOPSY     2007  . COLONOSCOPY  08   with polyps  . COLONOSCOPY  09/30/2011   Procedure: COLONOSCOPY;  Surgeon: Rogene Houston, MD;  Location: [facility];  Service: Endoscopy;  Laterality: N/A;  730  . HX COLON POLYPS    . TONSILLECTOMY     childhood    History reviewed. No pertinent family history. Social History:  reports that he quit smoking about 13 years ago. His smoking use included cigarettes. He has never used smokeless tobacco. He reports current alcohol use of about 4.0 standard drinks of alcohol per week. He reports that he does not use drugs.  Allergies:  Allergies  Allergen Reactions  . Penicillins Swelling    Did it involve swelling of the face/tongue/throat, SOB, or low BP? No Did it involve sudden or severe rash/hives, skin peeling, or any  reaction on the inside of your mouth or nose? No Did you need to seek medical attention at a hospital or doctor's office? Was already inpatient at the time When did it last happen?2005 If all above answers are "NO", may proceed with cephalosporin use.     Medications Prior to Admission  Medication Sig Dispense Refill  . aspirin EC 81 MG tablet Take 81 mg by mouth daily.     Cameron Wu CHARCOAL ACTIVATED PO Take 520 mg by mouth daily.     . finasteride (PROSCAR) 5 MG tablet Take 5 mg by mouth every morning.    Cameron Wu ibuprofen (ADVIL,MOTRIN) 200 MG tablet Take 400 mg by mouth every 6 (six) hours as needed for moderate pain.     Cameron Wu loratadine (CLARITIN) 10 MG tablet Take 10 mg by mouth daily. For allergies    . Magnesium 500 MG CAPS Take 500 mg by mouth daily.     . Melatonin 10 MG TABS Take 10 mg by mouth at bedtime as needed (sleep).     . Omega-3 Fatty Acids (FISH OIL OMEGA-3 PO) Take 1 capsule by mouth daily.    . psyllium (METAMUCIL) 58.6 % packet Take 1 packet by mouth daily as needed (constipation).     . Tamsulosin HCl (FLOMAX) 0.4 MG CAPS Take 0.4 mg by mouth at bedtime.       No  results found for this or any previous visit (from the past 75 hour(s)). No results found.  ROS  Blood pressure (!) 150/62, pulse 70, temperature 97.6 F (36.4 C), temperature source Oral, resp. rate 15, height 6' (1.829 m), weight 80.7 kg, SpO2 99 %. Physical Exam  Constitutional:  Well-developed thin Caucasian male in NAD.  HENT:  Mouth/Throat: Oropharynx is clear and moist.  Eyes: Conjunctivae are normal. No scleral icterus.  Neck: No thyromegaly present.  Cardiovascular: Normal rate, regular rhythm and normal heart sounds.  No murmur heard. Respiratory: Effort normal and breath sounds normal.  GI: Soft. He exhibits no distension and no mass. There is no abdominal tenderness.  Musculoskeletal:        General: No edema.  Lymphadenopathy:    He has no cervical adenopathy.  Neurological: He is alert.   Skin: Skin is warm and dry.     Assessment/Plan History of colonic adenoma. Surveillance colonoscopy.  Hildred Laser, MD 01/11/2019, 9:39 AM

## 2019-01-11 NOTE — Discharge Instructions (Signed)
Resume usual medications including aspirin as before. High-fiber diet. No driving for 24 hours. May consider another colonoscopy in 10 years.   Colonoscopy, Adult, Care After This sheet gives you information about how to care for yourself after your procedure. Your doctor may also give you more specific instructions. If you have problems or questions, call your doctor. What can I expect after the procedure? After the procedure, it is common to have:  A small amount of blood in your poop for 24 hours.  Some gas.  Mild cramping or bloating in your belly. Follow these instructions at home: General instructions  For the first 24 hours after the procedure: ? Do not drive or use machinery. ? Do not sign important documents. ? Do not drink alcohol. ? Do your daily activities more slowly than normal. ? Eat foods that are soft and easy to digest.  Take over-the-counter or prescription medicines only as told by your doctor. To help cramping and bloating:   Try walking around.  Put heat on your belly (abdomen) as told by your doctor. Use a heat source that your doctor recommends, such as a moist heat pack or a heating pad. ? Put a towel between your skin and the heat source. ? Leave the heat on for 20-30 minutes. ? Remove the heat if your skin turns bright red. This is especially important if you cannot feel pain, heat, or cold. You can get burned. Eating and drinking   Drink enough fluid to keep your pee (urine) clear or pale yellow.  Return to your normal diet as told by your doctor. Avoid heavy or fried foods that are hard to digest.  Avoid drinking alcohol for as long as told by your doctor. Contact a doctor if:  You have blood in your poop (stool) 2-3 days after the procedure. Get help right away if:  You have more than a small amount of blood in your poop.  You see large clumps of tissue (blood clots) in your poop.  Your belly is swollen.  You feel sick to your  stomach (nauseous).  You throw up (vomit).  You have a fever.  You have belly pain that gets worse, and medicine does not help your pain. Summary  After the procedure, it is common to have a small amount of blood in your poop. You may also have mild cramping and bloating in your belly.  For the first 24 hours after the procedure, do not drive or use machinery, do not sign important documents, and do not drink alcohol.  Get help right away if you have a lot of blood in your poop, feel sick to your stomach, have a fever, or have more belly pain. This information is not intended to replace advice given to you by your health care provider. Make sure you discuss any questions you have with your health care provider. Document Released: 05/08/2010 Document Revised: 02/03/2017 Document Reviewed: 12/29/2015 Elsevier Patient Education  2020 [employer].  Diverticulosis  Diverticulosis is a condition that develops when small pouches (diverticula) form in the wall of the large intestine (colon). The colon is where water is absorbed and stool is formed. The pouches form when the inside layer of the colon pushes through weak spots in the outer layers of the colon. You may have a few pouches or many of them. What are the causes? The cause of this condition is not known. What increases the risk? The following factors may make you more likely to develop  this condition:  Being older than age 27. Your risk for this condition increases with age. Diverticulosis is rare among people younger than age 56. By age 37, many people have it.  Eating a low-fiber diet.  Having frequent constipation.  Being overweight.  Not getting enough exercise.  Smoking.  Taking over-the-counter pain medicines, like aspirin and ibuprofen.  Having a family history of diverticulosis. What are the signs or symptoms? In most people, there are no symptoms of this condition. If you do have symptoms, they may  include:  Bloating.  Cramps in the abdomen.  Constipation or diarrhea.  Pain in the lower left side of the abdomen. How is this diagnosed? This condition is most often diagnosed during an exam for other colon problems. Because diverticulosis usually has no symptoms, it often cannot be diagnosed independently. This condition may be diagnosed by:  Using a flexible scope to examine the colon (colonoscopy).  Taking an X-ray of the colon after dye has been put into the colon (barium enema).  Doing a CT scan. How is this treated? You may not need treatment for this condition if you have never developed an infection related to diverticulosis. If you have had an infection before, treatment may include:  Eating a high-fiber diet. This may include eating more fruits, vegetables, and grains.  Taking a fiber supplement.  Taking a live bacteria supplement (probiotic).  Taking medicine to relax your colon.  Taking antibiotic medicines. Follow these instructions at home:  Drink 6-8 glasses of water or more each day to prevent constipation.  Try not to strain when you have a bowel movement.  If you have had an infection before: ? Eat more fiber as directed by your health care provider or your diet and nutrition specialist (dietitian). ? Take a fiber supplement or probiotic, if your health care provider approves.  Take over-the-counter and prescription medicines only as told by your health care provider.  If you were prescribed an antibiotic, take it as told by your health care provider. Do not stop taking the antibiotic even if you start to feel better.  Keep all follow-up visits as told by your health care provider. This is important. Contact a health care provider if:  You have pain in your abdomen.  You have bloating.  You have cramps.  You have not had a bowel movement in 3 days. Get help right away if:  Your pain gets worse.  Your bloating becomes very bad.  You have a  fever or chills, and your symptoms suddenly get worse.  You vomit.  You have bowel movements that are bloody or black.  You have bleeding from your rectum. Summary  Diverticulosis is a condition that develops when small pouches (diverticula) form in the wall of the large intestine (colon).  You may have a few pouches or many of them.  This condition is most often diagnosed during an exam for other colon problems.  If you have had an infection related to diverticulosis, treatment may include increasing the fiber in your diet, taking supplements, or taking medicines. This information is not intended to replace advice given to you by your health care provider. Make sure you discuss any questions you have with your health care provider. Document Released: 01/01/2004 Document Revised: 03/18/2017 Document Reviewed: 02/23/2016 Elsevier Patient Education  2020 [employer].  Hemorrhoids Hemorrhoids are swollen veins that may develop:  In the butt (rectum). These are called internal hemorrhoids.  Around the opening of the butt (anus). These  are called external hemorrhoids. Hemorrhoids can cause pain, itching, or bleeding. Most of the time, they do not cause serious problems. They usually get better with diet changes, lifestyle changes, and other home treatments. What are the causes? This condition may be caused by:  Having trouble pooping (constipation).  Pushing hard (straining) to poop.  Watery poop (diarrhea).  Pregnancy.  Being very overweight (obese).  Sitting for long periods of time.  Heavy lifting or other activity that causes you to strain.  Anal sex.  Riding a bike for a long period of time. What are the signs or symptoms? Symptoms of this condition include:  Pain.  Itching or soreness in the butt.  Bleeding from the butt.  Leaking poop.  Swelling in the area.  One or more lumps around the opening of your butt. How is this diagnosed? A doctor can often  diagnose this condition by looking at the affected area. The doctor may also:  Do an exam that involves feeling the area with a gloved hand (digital rectal exam).  Examine the area inside your butt using a small tube (anoscope).  Order blood tests. This may be done if you have lost a lot of blood.  Have you get a test that involves looking inside the colon using a flexible tube with a camera on the end (sigmoidoscopy or colonoscopy). How is this treated? This condition can usually be treated at home. Your doctor may tell you to change what you eat, make lifestyle changes, or try home treatments. If these do not help, procedures can be done to remove the hemorrhoids or make them smaller. These may involve:  Placing rubber bands at the base of the hemorrhoids to cut off their blood supply.  Injecting medicine into the hemorrhoids to shrink them.  Shining a type of light energy onto the hemorrhoids to cause them to fall off.  Doing surgery to remove the hemorrhoids or cut off their blood supply. Follow these instructions at home: Eating and drinking   Eat foods that have a lot of fiber in them. These include whole grains, beans, nuts, fruits, and vegetables.  Ask your doctor about taking products that have added fiber (fibersupplements).  Reduce the amount of fat in your diet. You can do this by: ? Eating low-fat dairy products. ? Eating less red meat. ? Avoiding processed foods.  Drink enough fluid to keep your pee (urine) pale yellow. Managing pain and swelling   Take a warm-water bath (sitz bath) for 20 minutes to ease pain. Do this 3-4 times a day. You may do this in a bathtub or using a portable sitz bath that fits over the toilet.  If told, put ice on the painful area. It may be helpful to use ice between your warm baths. ? Put ice in a plastic bag. ? Place a towel between your skin and the bag. ? Leave the ice on for 20 minutes, 2-3 times a day. General  instructions  Take over-the-counter and prescription medicines only as told by your doctor. ? Medicated creams and medicines may be used as told.  Exercise often. Ask your doctor how much and what kind of exercise is best for you.  Go to the bathroom when you have the urge to poop. Do not wait.  Avoid pushing too hard when you poop.  Keep your butt dry and clean. Use wet toilet paper or moist towelettes after pooping.  Do not sit on the toilet for a long time.  Keep  all follow-up visits as told by your doctor. This is important. Contact a doctor if you:  Have pain and swelling that do not get better with treatment or medicine.  Have trouble pooping.  Cannot poop.  Have pain or swelling outside the area of the hemorrhoids. Get help right away if you have:  Bleeding that will not stop. Summary  Hemorrhoids are swollen veins in the butt or around the opening of the butt.  They can cause pain, itching, or bleeding.  Eat foods that have a lot of fiber in them. These include whole grains, beans, nuts, fruits, and vegetables.  Take a warm-water bath (sitz bath) for 20 minutes to ease pain. Do this 3-4 times a day. This information is not intended to replace advice given to you by your health care provider. Make sure you discuss any questions you have with your health care provider. Document Released: 01/13/2008 Document Revised: 04/13/2018 Document Reviewed: 08/25/2017 Elsevier Patient Education  [place].   High-Fiber Diet Fiber, also called dietary fiber, is a type of carbohydrate that is found in fruits, vegetables, whole grains, and beans. A high-fiber diet can have many health benefits. Your health care provider may recommend a high-fiber diet to help:  Prevent constipation. Fiber can make your bowel movements more regular.  Lower your cholesterol.  Relieve the following conditions: ? Swelling of veins in the anus (hemorrhoids). ? Swelling and irritation  (inflammation) of specific areas of the digestive tract (uncomplicated diverticulosis). ? A problem of the large intestine (colon) that sometimes causes pain and diarrhea (irritable bowel syndrome, IBS).  Prevent overeating as part of a weight-loss plan.  Prevent heart disease, type 2 diabetes, and certain cancers. What is my plan? The recommended daily fiber intake in grams (g) includes:  38 g for men age 29 or younger.  30 g for men over age 57.  76 g for women age 36 or younger.  21 g for women over age 11. You can get the recommended daily intake of dietary fiber by:  Eating a variety of fruits, vegetables, grains, and beans.  Taking a fiber supplement, if it is not possible to get enough fiber through your diet. What do I need to know about a high-fiber diet?  It is better to get fiber through food sources rather than from fiber supplements. There is not a lot of research about how effective supplements are.  Always check the fiber content on the nutrition facts label of any prepackaged food. Look for foods that contain 5 g of fiber or more per serving.  Talk with a diet and nutrition specialist (dietitian) if you have questions about specific foods that are recommended or not recommended for your medical condition, especially if those foods are not listed below.  Gradually increase how much fiber you consume. If you increase your intake of dietary fiber too quickly, you may have bloating, cramping, or gas.  Drink plenty of water. Water helps you to digest fiber. What are tips for following this plan?  Eat a wide variety of high-fiber foods.  Make sure that half of the grains that you eat each day are whole grains.  Eat breads and cereals that are made with whole-grain flour instead of refined flour or white flour.  Eat brown rice, bulgur wheat, or millet instead of white rice.  Start the day with a breakfast that is high in fiber, such as a cereal that contains 5 g of  fiber or more per serving.  Use beans in place of meat in soups, salads, and pasta dishes.  Eat high-fiber snacks, such as berries, raw vegetables, nuts, and popcorn.  Choose whole fruits and vegetables instead of processed forms like juice or sauce. What foods can I eat?  Fruits Berries. Pears. Apples. Oranges. Avocado. Prunes and raisins. Dried figs. Vegetables Sweet potatoes. Spinach. Kale. Artichokes. Cabbage. Broccoli. Cauliflower. Green peas. Carrots. Squash. Grains Whole-grain breads. Multigrain cereal. Oats and oatmeal. Brown rice. Barley. Bulgur wheat. [place]. Quinoa. Bran muffins. Popcorn. Rye wafer crackers. Meats and other proteins Navy, kidney, and pinto beans. Soybeans. Split peas. Lentils. Nuts and seeds. Dairy Fiber-fortified yogurt. Beverages Fiber-fortified soy milk. Fiber-fortified orange juice. Other foods Fiber bars. The items listed above may not be a complete list of recommended foods and beverages. Contact a dietitian for more options. What foods are not recommended? Fruits Fruit juice. Cooked, strained fruit. Vegetables Fried potatoes. Canned vegetables. Well-cooked vegetables. Grains White bread. Pasta made with refined flour. White rice. Meats and other proteins Fatty cuts of meat. Fried chicken or fried fish. Dairy Milk. Yogurt. Cream cheese. Sour cream. Fats and oils Butters. Beverages Soft drinks. Other foods Cakes and pastries. The items listed above may not be a complete list of foods and beverages to avoid. Contact a dietitian for more information. Summary  Fiber is a type of carbohydrate. It is found in fruits, vegetables, whole grains, and beans.  There are many health benefits of eating a high-fiber diet, such as preventing constipation, lowering blood cholesterol, helping with weight loss, and reducing your risk of heart disease, diabetes, and certain cancers.  Gradually increase your intake of fiber. Increasing too fast can result  in cramping, bloating, and gas. Drink plenty of water while you increase your fiber.  The best sources of fiber include whole fruits and vegetables, whole grains, nuts, seeds, and beans. This information is not intended to replace advice given to you by your health care provider. Make sure you discuss any questions you have with your health care provider. Document Released: 04/05/2005 Document Revised: 02/07/2017 Document Reviewed: 02/07/2017 Elsevier Patient Education  2020 [employer].

## 2019-01-11 NOTE — Op Note (Signed)
[facility] Patient Name: Cameron Wu Procedure Date: 01/11/2019 9:21 AM MRN: [id number] Date of Birth: 18-Jan-1951 Attending MD: Hildred Laser , MD CSN: [id number] Age: 68 Admit Type: Outpatient Procedure:                Colonoscopy Indications:              High risk colon cancer surveillance: Personal                            history of colonic polyps Providers:                Hildred Laser, MD, Otis Peak B. Sharon Seller, RN, Aram Candela Referring MD:             [name], MD Medicines:                Meperidine 50 mg IV, Midazolam 5 mg IV Complications:            No immediate complications. Estimated Blood Loss:     Estimated blood loss: none. Procedure:                Pre-Anesthesia Assessment:                           - Prior to the procedure, a History and Physical                            was performed, and patient medications and                            allergies were reviewed. The patient's tolerance of                            previous anesthesia was also reviewed. The risks                            and benefits of the procedure and the sedation                            options and risks were discussed with the patient.                            All questions were answered, and informed consent                            was obtained. Prior Anticoagulants: The patient has                            taken no previous anticoagulant or antiplatelet                            agents except for aspirin and has taken no previous  anticoagulant or antiplatelet agents except for                            NSAID medication. ASA Grade Assessment: I - A                            normal, healthy patient. After reviewing the risks                            and benefits, the patient was deemed in                            satisfactory condition to undergo the procedure.                           After obtaining  informed consent, the colonoscope                            was passed under direct vision. Throughout the                            procedure, the patient's blood pressure, pulse, and                            oxygen saturations were monitored continuously. The                            [id number] scope was introduced through                            the anus and advanced to the the cecum, identified                            by appendiceal orifice and ileocecal valve. The                            ileocecal valve, appendiceal orifice, and rectum                            were photographed. Scope In: 9:47:23 AM Scope Out: 10:00:23 AM Scope Withdrawal Time: 0 hours 9 minutes 1 second  Total Procedure Duration: 0 hours 13 minutes 0 seconds  Findings:      The perianal and digital rectal examinations were normal.      Scattered diverticula were found in the entire colon.      External and internal hemorrhoids were found during retroflexion. The       hemorrhoids were small. Impression:               - Diverticulosis in the entire examined colon.                           - External and internal hemorrhoids.                           - No  specimens collected. Moderate Sedation:      Moderate (conscious) sedation was administered by the endoscopy nurse       and supervised by the endoscopist. The following parameters were       monitored: oxygen saturation, heart rate, blood pressure, CO2       capnography and response to care. Total physician intraservice time was       16 minutes. Recommendation:           - Patient has a contact number available for                            emergencies. The signs and symptoms of potential                            delayed complications were discussed with the                            patient. Return to normal activities tomorrow.                            Written discharge instructions were provided to the                             patient.                           - High fiber diet today.                           - Continue present medications.                           - May consider colonoscopy in 10 years. Procedure Code(s):        --- Professional ---                           [phone], Colonoscopy, flexible; diagnostic, including                            collection of specimen(s) by brushing or washing,                            when performed (separate procedure)                           G0500, Moderate sedation services provided by the                            same physician or other qualified health care                            professional performing a gastrointestinal                            endoscopic service that sedation supports,  requiring the presence of an independent trained                            observer to assist in the monitoring of the                            patient's level of consciousness and physiological                            status; initial 15 minutes of intra-service time;                            patient age 19 years or older (additional time may                            be reported with [phone], as appropriate) Diagnosis Code(s):        --- Professional ---                           Z86.010, Personal history of colonic polyps                           K64.8, Other hemorrhoids                           K57.30, Diverticulosis of large intestine without                            perforation or abscess without bleeding CPT copyright 2019 American Medical Association. All rights reserved. The codes documented in this report are preliminary and upon coder review may  be revised to meet current compliance requirements. Hildred Laser, MD Hildred Laser, MD 01/11/2019 10:08:47 AM This report has been signed electronically. Number of Addenda: 0

## 2019-01-19 ENCOUNTER — Encounter (HOSPITAL_COMMUNITY): Payer: Self-pay | Admitting: Internal Medicine

## 2019-01-22 ENCOUNTER — Inpatient Hospital Stay: Payer: Medicare Other | Admitting: Hematology

## 2019-01-22 ENCOUNTER — Other Ambulatory Visit: Payer: Self-pay

## 2019-01-22 ENCOUNTER — Inpatient Hospital Stay: Payer: Medicare Other | Attending: Hematology

## 2019-01-22 VITALS — BP 135/65 | HR 65 | Temp 97.8°F | Resp 17 | Ht 72.0 in | Wt 181.4 lb

## 2019-01-22 DIAGNOSIS — Z791 Long term (current) use of non-steroidal anti-inflammatories (NSAID): Secondary | ICD-10-CM | POA: Insufficient documentation

## 2019-01-22 DIAGNOSIS — C9140 Hairy cell leukemia not having achieved remission: Secondary | ICD-10-CM | POA: Diagnosis present

## 2019-01-22 DIAGNOSIS — K219 Gastro-esophageal reflux disease without esophagitis: Secondary | ICD-10-CM | POA: Insufficient documentation

## 2019-01-22 DIAGNOSIS — Z7982 Long term (current) use of aspirin: Secondary | ICD-10-CM | POA: Insufficient documentation

## 2019-01-22 DIAGNOSIS — C9141 Hairy cell leukemia, in remission: Secondary | ICD-10-CM

## 2019-01-22 DIAGNOSIS — Z79899 Other long term (current) drug therapy: Secondary | ICD-10-CM | POA: Insufficient documentation

## 2019-01-22 DIAGNOSIS — Z9221 Personal history of antineoplastic chemotherapy: Secondary | ICD-10-CM | POA: Diagnosis not present

## 2019-01-22 DIAGNOSIS — M545 Low back pain: Secondary | ICD-10-CM | POA: Insufficient documentation

## 2019-01-22 LAB — CBC WITH DIFFERENTIAL/PLATELET
Abs Immature Granulocytes: 0.01 10*3/uL (ref 0.00–0.07)
Basophils Absolute: 0 10*3/uL (ref 0.0–0.1)
Basophils Relative: 0 %
Eosinophils Absolute: 0.1 10*3/uL (ref 0.0–0.5)
Eosinophils Relative: 1 %
HCT: 42.1 % (ref 39.0–52.0)
Hemoglobin: 14.1 g/dL (ref 13.0–17.0)
Immature Granulocytes: 0 %
Lymphocytes Relative: 24 %
Lymphs Abs: 1.6 10*3/uL (ref 0.7–4.0)
MCH: 31.3 pg (ref 26.0–34.0)
MCHC: 33.5 g/dL (ref 30.0–36.0)
MCV: 93.3 fL (ref 80.0–100.0)
Monocytes Absolute: 0.4 10*3/uL (ref 0.1–1.0)
Monocytes Relative: 6 %
Neutro Abs: 4.6 10*3/uL (ref 1.7–7.7)
Neutrophils Relative %: 69 %
Platelets: 163 10*3/uL (ref 150–400)
RBC: 4.51 MIL/uL (ref 4.22–5.81)
RDW: 13.4 % (ref 11.5–15.5)
WBC: 6.7 10*3/uL (ref 4.0–10.5)
nRBC: 0 % (ref 0.0–0.2)

## 2019-01-22 LAB — CMP (CANCER CENTER ONLY)
ALT: 21 U/L (ref 0–44)
AST: 20 U/L (ref 15–41)
Albumin: 3.9 g/dL (ref 3.5–5.0)
Alkaline Phosphatase: 82 U/L (ref 38–126)
Anion gap: 7 (ref 5–15)
BUN: 16 mg/dL (ref 8–23)
CO2: 30 mmol/L (ref 22–32)
Calcium: 9.2 mg/dL (ref 8.9–10.3)
Chloride: 107 mmol/L (ref 98–111)
Creatinine: 0.99 mg/dL (ref 0.61–1.24)
GFR, Est AFR Am: >60 mL/min (ref >60)
GFR, Estimated: >60 mL/min (ref >60)
Glucose, Bld: 68 mg/dL — ABNORMAL LOW (ref 70–99)
Potassium: 4.1 mmol/L (ref 3.5–5.1)
Sodium: 144 mmol/L (ref 135–145)
Total Bilirubin: 0.4 mg/dL (ref 0.3–1.2)
Total Protein: 6.7 g/dL (ref 6.5–8.1)

## 2019-01-22 LAB — LACTATE DEHYDROGENASE: LDH: 179 U/L (ref 98–192)

## 2019-01-22 NOTE — Progress Notes (Signed)
HEMATOLOGY/ONCOLOGY CONSULTATION NOTE  Date of Service: 01/22/2019  Patient Care Team: [name], MD as PCP - General (Internal Medicine)  CHIEF COMPLAINTS/PURPOSE OF CONSULTATION:  Hairy Cell Leukemia  Oncologic History:   Cameron Wu was initially diagnosed with Hairy Cell Leukemia in January 1992, at which time he presented with constitutional symptoms and massive splenomegaly. He was treated with Cladribine by special exception protocol from the [facility] before the drug was officially FDA approved. He had progression in July 1995, and ws treated again with Cladribine. The pt then progressed again in June 2007, and was treated with Cladribine and Rituxan. He has remained in remission since that time.  HISTORY OF PRESENTING ILLNESS:   Cameron Wu is a wonderful 68 y.o. male who has been referred to [place] by Dr. Murriel Hopper for evaluation and management of his Hairy Cell Leukemia. The pt reports that he is doing well overall.  The pt reports that he has not developed any new concerns since his last visit with Dr. Beryle Beams a month ago. He denies abdominal pains or worsened energy levels, fevers, chills, night sweats, or unexpected weight loss. The pt notes that he has some chronic right shoulder and knee pain, residual from a fall about a year ago. He denies concerns for recent infections. He has followed with his PCP for is flu and pneumonia vaccinations.  The pt notes that each time he received Cladribine, he received a dose each day for 5 consecutive days in an outpatient setting. The pt denies any major side effects from the chemotherapy, but did have cytopenias.   The pt notes that at his last relapse in 2007, he did not have significant symptoms, but was seen to develop abnormal blood counts, and was confirmed with a BM Bx.  He denies swallowing problems, heart issues, lung issues, or liver problems. He notes that he has a "nervous stomach which  growls for hours after eating." He sees Dr. Diona Fanti in Urology for his BPH and takes Finasteride. The pt notes social alcohol use and denies ever being drunk.  Most recent lab results (07/03/18) of CBC w/diff is as follows: all values are WNL.  On review of systems, pt reports good energy levels, eating well, stable weight, and denies fevers, chills, night sweats, unexpected weight loss, swallowing difficulty, breathing difficulty, leg swelling, skin rashes or lesions, concern for infections, mouth sores, pain along the spine, lower abdominal pains, leg swelling, noticing any new lumps or bumps, and any other symptoms.   On PMHx the pt reports Hairy cell leukemia, environmental allergies  INTERVAL HISTORY:   Cameron Wu is a wonderful 68 y.o. male who is here for evaluation and management of his Hairy Cell Leukemia. The patient's last visit with [place] was on 07/31/2018. The pt reports that he is doing well overall.  The pt reports that he has been staying busy by doing yard work and has been staying safe in the pandemic. He does have some lower back pain from lifting heavy items while working in his yard. Pt had a Colonoscopy on 09/24 which was normal and did not reveal any polyps. Pt has had his annual flu shot and his PCP says that he is up to date on his Pneumonia vaccines.  Lab results today (01/22/19) of CBC w/diff and CMP is as follows: all values are WNL except for 68. 01/22/2019 LDH at 179.  On review of systems, pt reports lower back pain and denies fevers, chills,  infection issues, fatigue, new lumps/bumps, abdominal pain, new skin rashes and any other symptoms.   MEDICAL HISTORY:  Past Medical History:  Diagnosis Date  . Abnormal liver function tests 05/03/2011  . BPH (benign prostatic hypertrophy) 05/03/2011  . Elevated transaminase level   . GERD (gastroesophageal reflux disease)   . Hairy cell leukemia   . Hairy cell leukemia    1992  . Hairy cell leukemia, in remission  ([place]) 05/03/2011  . IBS (irritable bowel syndrome) 05/03/2011  . Irritable bowel syndrome    2 yrs ago, states he had inflammation around anus. Biopsy: inclusive    SURGICAL HISTORY: Past Surgical History:  Procedure Laterality Date  . BONE MARROW BIOPSY     2007  . COLONOSCOPY  08   with polyps  . COLONOSCOPY  09/30/2011   Procedure: COLONOSCOPY;  Surgeon: Rogene Houston, MD;  Location: [facility];  Service: Endoscopy;  Laterality: N/A;  730  . COLONOSCOPY N/A 01/11/2019   Procedure: COLONOSCOPY;  Surgeon: Rogene Houston, MD;  Location: [facility];  Service: Endoscopy;  Laterality: N/A;  100-office move to 9:30am  . HX COLON POLYPS    . TONSILLECTOMY     childhood    SOCIAL HISTORY: Social History   Socioeconomic History  . Marital status: Married    Spouse name: Not on file  . Number of children: Not on file  . Years of education: Not on file  . Highest education level: Not on file  Occupational History  . Not on file  Social Needs  . Financial resource strain: Not on file  . Food insecurity    Worry: Not on file    Inability: Not on file  . Transportation needs    Medical: Not on file    Non-medical: Not on file  Tobacco Use  . Smoking status: Former Smoker    Types: Cigarettes    Quit date: 12/18/2005    Years since quitting: 13.1  . Smokeless tobacco: Never Used  Substance and Sexual Activity  . Alcohol use: Yes    Alcohol/week: 4.0 standard drinks    Types: 4 Cans of beer per week    Comment: social  . Drug use: No  . Sexual activity: Not on file  Lifestyle  . Physical activity    Days per week: Not on file    Minutes per session: Not on file  . Stress: Not on file  Relationships  . Social Herbalist on phone: Not on file    Gets together: Not on file    Attends religious service: Not on file    Active member of club or organization: Not on file    Attends meetings of clubs or organizations: Not on file    Relationship status: Not  on file  . Intimate partner violence    Fear of current or ex partner: Not on file    Emotionally abused: Not on file    Physically abused: Not on file    Forced sexual activity: Not on file  Other Topics Concern  . Not on file  Social History Narrative  . Not on file    FAMILY HISTORY: No family history on file.  ALLERGIES:  is allergic to penicillins.  MEDICATIONS:  Current Outpatient Medications  Medication Sig Dispense Refill  . aspirin EC 81 MG tablet Take 81 mg by mouth daily.     Marland Kitchen CHARCOAL ACTIVATED PO Take 520 mg by mouth daily.     Marland Kitchen  finasteride (PROSCAR) 5 MG tablet Take 5 mg by mouth every morning.    Marland Kitchen ibuprofen (ADVIL,MOTRIN) 200 MG tablet Take 400 mg by mouth every 6 (six) hours as needed for moderate pain.     Marland Kitchen loratadine (CLARITIN) 10 MG tablet Take 10 mg by mouth daily. For allergies    . Magnesium 500 MG CAPS Take 500 mg by mouth daily.     . Melatonin 10 MG TABS Take 10 mg by mouth at bedtime as needed (sleep).     . Omega-3 Fatty Acids (FISH OIL OMEGA-3 PO) Take 1 capsule by mouth daily.    . psyllium (METAMUCIL) 58.6 % packet Take 1 packet by mouth daily as needed (constipation).     . Tamsulosin HCl (FLOMAX) 0.4 MG CAPS Take 0.4 mg by mouth at bedtime.      No current facility-administered medications for this visit.     REVIEW OF SYSTEMS:    A 10+ POINT REVIEW OF SYSTEMS WAS OBTAINED including neurology, dermatology, psychiatry, cardiac, respiratory, lymph, extremities, GI, GU, Musculoskeletal, constitutional, breasts, reproductive, HEENT.  All pertinent positives are noted in the HPI.  All others are negative.   PHYSICAL EXAMINATION: ECOG PERFORMANCE STATUS: 1 - Symptomatic but completely ambulatory  . There were no vitals filed for this visit. There were no vitals filed for this visit. .There is no height or weight on file to calculate BMI.  GENERAL:alert, in no acute distress and comfortable SKIN: no acute rashes, no significant lesions  EYES: conjunctiva are pink and non-injected, sclera anicteric OROPHARYNX: MMM, no exudates, no oropharyngeal erythema or ulceration NECK: supple, no JVD LYMPH:  no palpable lymphadenopathy in the cervical, axillary or inguinal regions LUNGS: clear to auscultation b/l with normal respiratory effort HEART: regular rate & rhythm ABDOMEN:  normoactive bowel sounds , non tender, not distended. No palpable hepatosplenomegaly.  Extremity: no pedal edema PSYCH: alert & oriented x 3 with fluent speech NEURO: no focal motor/sensory deficits  LABORATORY DATA:  I have reviewed the data as listed  . CBC Latest Ref Rng & Units 07/03/2018 04/03/2018 12/05/2017  WBC 3.4 - 10.8 x10E3/uL 6.2 5.6 5.7  Hemoglobin 13.0 - 17.7 g/dL 14.2 14.3 14.1  Hematocrit 37.5 - 51.0 % 40.2 41.8 42.2  Platelets 150 - 450 x10E3/uL 174 201 181    . CMP Latest Ref Rng & Units 12/05/2017 12/28/2016 07/14/2015  Glucose 65 - 99 mg/dL 86 69 71  BUN 8 - 27 mg/dL _0 Creatinine 0.76 - 1.27 mg/dL 1.06 1.20 1.11  Sodium 134 - 144 mmol/L 142 145(H) 142  Potassium 3.5 - 5.2 mmol/L 4.7 4.2 4.3  Chloride 96 - 106 mmol/L 103 104 101  CO2 20 - 29 mmol/L _1 Calcium 8.6 - 10.2 mg/dL 9.3 9.2 9.1  Total Protein 6.0 - 8.5 g/dL 6.2 6.7 6.5  Total Bilirubin 0.0 - 1.2 mg/dL 0.4 0.5 0.6  Alkaline Phos 39 - 117 IU/L 78 92 72  AST 0 - 40 IU/L 36 29 33  ALT 0 - 44 IU/L 47(H) 36 46(H)    12/02/05 BM Bx:   12/02/05 Flow Cytometry:     RADIOGRAPHIC STUDIES: I have personally reviewed the radiological images as listed and agreed with the findings in the report. No results found.  ASSESSMENT & PLAN:   68 y.o. male with  1. Hairy cell leukemia, currently in 3rd remission Diagnosed in January 1992 presented with constitutional symptoms and massive splenomegaly. S/p Cladribine July 1995 progression  S/p Cladribine June 2007 progression S/p Cladribine and Rituxan  PLAN: -Discussed pt labwork today, 01/22/19; all values  are WNL except for 68. -Discussed 01/22/2019 LDH at 179. -Recommend following up with PCP for flu vaccines, Prevnar, Pneumovax and Shingrix. Pt endorses compliance with all of these. -The pt shows no clinical or lab progression/return of his hairy cell leukemia at this time.  -No indication for further treatment at this time. -Advised pt to avoid lifting heavy or twisting his lower back -Advised infection prevention strategies, crowd avoidance, and frequent hand washing -Will see back in 6 months  FOLLOW UP: RTC with Dr Irene Limbo with labs in 6 months  The total time spent in the appt was 15 minutes and more than 50% was on counseling and direct patient cares.  All of the patient's questions were answered with apparent satisfaction. The patient knows to call the clinic with any problems, questions or concerns.    Sullivan Lone MD [place] AAHIVMS [facility] Hematology/Oncology Physician [facility]  (Office):       [phone] (Work cell):  [phone] (Fax):           [phone]  01/22/2019 3:30 AM  I, Yevette Edwards, am acting as a scribe for Dr. Sullivan Lone.   .I have reviewed the above documentation for accuracy and completeness, and I agree with the above. Brunetta Genera MD

## 2019-01-23 ENCOUNTER — Telehealth: Payer: Self-pay | Admitting: Hematology

## 2019-01-23 NOTE — Telephone Encounter (Signed)
Scheduled appt per 10/5 los.Durene Cal a staff message to get a calendar mailed out.

## 2019-04-30 ENCOUNTER — Other Ambulatory Visit: Payer: Self-pay

## 2019-04-30 DIAGNOSIS — R972 Elevated prostate specific antigen [PSA]: Secondary | ICD-10-CM

## 2019-05-20 ENCOUNTER — Ambulatory Visit: Payer: Medicare Other

## 2019-05-25 ENCOUNTER — Ambulatory Visit: Payer: Medicare Other

## 2019-05-27 ENCOUNTER — Ambulatory Visit: Payer: Medicare Other

## 2019-07-06 ENCOUNTER — Encounter: Payer: Self-pay | Admitting: Hematology

## 2019-07-12 DIAGNOSIS — H35351 Cystoid macular degeneration, right eye: Secondary | ICD-10-CM | POA: Diagnosis not present

## 2019-07-12 DIAGNOSIS — H25811 Combined forms of age-related cataract, right eye: Secondary | ICD-10-CM | POA: Diagnosis not present

## 2019-07-12 DIAGNOSIS — H401131 Primary open-angle glaucoma, bilateral, mild stage: Secondary | ICD-10-CM | POA: Diagnosis not present

## 2019-07-23 ENCOUNTER — Inpatient Hospital Stay: Payer: Medicare PPO | Admitting: Hematology

## 2019-07-23 ENCOUNTER — Other Ambulatory Visit: Payer: Self-pay

## 2019-07-23 ENCOUNTER — Inpatient Hospital Stay: Payer: Medicare PPO | Attending: Hematology

## 2019-07-23 VITALS — BP 145/80 | HR 77 | Temp 98.8°F | Resp 18 | Ht 72.0 in | Wt 180.2 lb

## 2019-07-23 DIAGNOSIS — Z7982 Long term (current) use of aspirin: Secondary | ICD-10-CM | POA: Diagnosis not present

## 2019-07-23 DIAGNOSIS — Z79899 Other long term (current) drug therapy: Secondary | ICD-10-CM | POA: Diagnosis not present

## 2019-07-23 DIAGNOSIS — Z9221 Personal history of antineoplastic chemotherapy: Secondary | ICD-10-CM | POA: Insufficient documentation

## 2019-07-23 DIAGNOSIS — C9141 Hairy cell leukemia, in remission: Secondary | ICD-10-CM | POA: Diagnosis not present

## 2019-07-23 DIAGNOSIS — Z87891 Personal history of nicotine dependence: Secondary | ICD-10-CM | POA: Diagnosis not present

## 2019-07-23 DIAGNOSIS — H9313 Tinnitus, bilateral: Secondary | ICD-10-CM | POA: Insufficient documentation

## 2019-07-23 DIAGNOSIS — N400 Enlarged prostate without lower urinary tract symptoms: Secondary | ICD-10-CM | POA: Diagnosis not present

## 2019-07-23 DIAGNOSIS — K589 Irritable bowel syndrome without diarrhea: Secondary | ICD-10-CM | POA: Insufficient documentation

## 2019-07-23 LAB — CBC WITH DIFFERENTIAL/PLATELET
Abs Immature Granulocytes: 0.01 10*3/uL (ref 0.00–0.07)
Basophils Absolute: 0 10*3/uL (ref 0.0–0.1)
Basophils Relative: 0 %
Eosinophils Absolute: 0.1 10*3/uL (ref 0.0–0.5)
Eosinophils Relative: 1 %
HCT: 42.6 % (ref 39.0–52.0)
Hemoglobin: 14.1 g/dL (ref 13.0–17.0)
Immature Granulocytes: 0 %
Lymphocytes Relative: 27 %
Lymphs Abs: 1.8 10*3/uL (ref 0.7–4.0)
MCH: 30.7 pg (ref 26.0–34.0)
MCHC: 33.1 g/dL (ref 30.0–36.0)
MCV: 92.6 fL (ref 80.0–100.0)
Monocytes Absolute: 0.5 10*3/uL (ref 0.1–1.0)
Monocytes Relative: 7 %
Neutro Abs: 4.3 10*3/uL (ref 1.7–7.7)
Neutrophils Relative %: 65 %
Platelets: 161 10*3/uL (ref 150–400)
RBC: 4.6 MIL/uL (ref 4.22–5.81)
RDW: 13.5 % (ref 11.5–15.5)
WBC: 6.6 10*3/uL (ref 4.0–10.5)
nRBC: 0 % (ref 0.0–0.2)

## 2019-07-23 LAB — CMP (CANCER CENTER ONLY)
ALT: 30 U/L (ref 0–44)
AST: 25 U/L (ref 15–41)
Albumin: 3.9 g/dL (ref 3.5–5.0)
Alkaline Phosphatase: 78 U/L (ref 38–126)
Anion gap: 9 (ref 5–15)
BUN: 15 mg/dL (ref 8–23)
CO2: 26 mmol/L (ref 22–32)
Calcium: 9 mg/dL (ref 8.9–10.3)
Chloride: 110 mmol/L (ref 98–111)
Creatinine: 1.01 mg/dL (ref 0.61–1.24)
GFR, Est AFR Am: >60 mL/min (ref >60)
GFR, Estimated: >60 mL/min (ref >60)
Glucose, Bld: 97 mg/dL (ref 70–99)
Potassium: 4 mmol/L (ref 3.5–5.1)
Sodium: 145 mmol/L (ref 135–145)
Total Bilirubin: 0.4 mg/dL (ref 0.3–1.2)
Total Protein: 6.8 g/dL (ref 6.5–8.1)

## 2019-07-23 LAB — LACTATE DEHYDROGENASE: LDH: 197 U/L — ABNORMAL HIGH (ref 98–192)

## 2019-07-23 NOTE — Progress Notes (Signed)
HEMATOLOGY/ONCOLOGY CLINIC NOTE  Date of Service: 07/23/2019  Patient Care Team: [name], MD as PCP - General (Internal Medicine)  CHIEF COMPLAINTS/PURPOSE OF CONSULTATION:  Hairy Cell Leukemia  Oncologic History:   Cameron Wu was initially diagnosed with Hairy Cell Leukemia in January 1992, at which time he presented with constitutional symptoms and massive splenomegaly. He was treated with Cladribine by special exception protocol from the [facility] before the drug was officially FDA approved. He had progression in July 1995, and ws treated again with Cladribine. The pt then progressed again in June 2007, and was treated with Cladribine and Rituxan. He has remained in remission since that time.  HISTORY OF PRESENTING ILLNESS:   Cameron Wu is a wonderful 69 y.o. male who has been referred to [place] by Dr. Murriel Hopper for evaluation and management of his Hairy Cell Leukemia. The pt reports that he is doing well overall.  The pt reports that he has not developed any new concerns since his last visit with Dr. Beryle Beams a month ago. He denies abdominal pains or worsened energy levels, fevers, chills, night sweats, or unexpected weight loss. The pt notes that he has some chronic right shoulder and knee pain, residual from a fall about a year ago. He denies concerns for recent infections. He has followed with his PCP for is flu and pneumonia vaccinations.  The pt notes that each time he received Cladribine, he received a dose each day for 5 consecutive days in an outpatient setting. The pt denies any major side effects from the chemotherapy, but did have cytopenias.   The pt notes that at his last relapse in 2007, he did not have significant symptoms, but was seen to develop abnormal blood counts, and was confirmed with a BM Bx.  He denies swallowing problems, heart issues, lung issues, or liver problems. He notes that he has a "nervous stomach which growls  for hours after eating." He sees Dr. Diona Fanti in Urology for his BPH and takes Finasteride. The pt notes social alcohol use and denies ever being drunk.  Most recent lab results (07/03/18) of CBC w/diff is as follows: all values are WNL.  On review of systems, pt reports good energy levels, eating well, stable weight, and denies fevers, chills, night sweats, unexpected weight loss, swallowing difficulty, breathing difficulty, leg swelling, skin rashes or lesions, concern for infections, mouth sores, pain along the spine, lower abdominal pains, leg swelling, noticing any new lumps or bumps, and any other symptoms.   On PMHx the pt reports Hairy cell leukemia, environmental allergies  INTERVAL HISTORY:   Cameron Wu is a wonderful 69 y.o. male who is here for evaluation and management of his Hairy Cell Leukemia. The patient's last visit with [place] was on 01/22/2019. The pt reports that she is doing well overall.  The pt reports that during Thanksgiving he was getting out of a car and dislocated his shoulder. He saw a specialist who gave him a Cortisone injection in his shoulder. This improved the pain for about 2 weeks. On Christmas pt woke up with ringing in both of his ears, which is still present today. 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 He has also has some pain from his sciatic nerve that travels down his left leg. He has been in contact with his PCP regarding these concerns. Pt is dealing with some stress and anxiety due to recent health changes regarding his daughter.  Pt has had both doses of his COVID19 vaccines and tolerated them relatively well.   Lab results today (07/23/19) of CBC w/diff and CMP is as follows: all values are WNL. 07/23/2019 LDH at 197  On review of systems, pt reports shoulder pain, ringing in his ears, sciatic nerve pain, anxiety, stress and denies fevers, chills, night sweats, testicular pain/swelling and any other symptoms.    MEDICAL HISTORY:  Past Medical History:  Diagnosis Date  . Abnormal liver function tests 05/03/2011  . BPH (benign prostatic hypertrophy) 05/03/2011  . Elevated transaminase level   . GERD (gastroesophageal reflux disease)   . Hairy cell leukemia   . Hairy cell leukemia    1992  . Hairy cell leukemia, in remission ([place]) 05/03/2011  . IBS (irritable bowel syndrome) 05/03/2011  . Irritable bowel syndrome    2 yrs ago, states he had inflammation around anus. Biopsy: inclusive    SURGICAL HISTORY: Past Surgical History:  Procedure Laterality Date  . BONE MARROW BIOPSY     2007  . COLONOSCOPY  08   with polyps  . COLONOSCOPY  09/30/2011   Procedure: COLONOSCOPY;  Surgeon: Rogene Houston, MD;  [facility];  Service: Endoscopy;  Laterality: N/A;  730  . COLONOSCOPY N/A 01/11/2019   Procedure: COLONOSCOPY;  Surgeon: Rogene Houston, MD;  [facility];  Service: Endoscopy;  Laterality: N/A;  100-office move to 9:30am  . HX COLON POLYPS    . TONSILLECTOMY     childhood    SOCIAL HISTORY: Social History   Socioeconomic History  . Marital status: Married    Spouse name: Not on file  . Number of children: Not on file  . Years of education: Not on file  . Highest education level: Not on file  Occupational History  . Not on file  Tobacco Use  . Smoking status: Former Smoker    Types: Cigarettes    Quit date: 12/18/2005    Years since quitting: 13.6  . Smokeless tobacco: Never Used  Substance and Sexual Activity  . Alcohol use: Yes    Alcohol/week: 4.0 standard drinks    Types: 4 Cans of beer per week    Comment: social  . Drug use: No  . Sexual activity: Not on file  Other Topics Concern  . Not on file  Social History Narrative  . Not on file   Social Determinants of Health   Financial Resource Strain:   . Difficulty of Paying Living Expenses:   Food Insecurity:   . Worried About Charity fundraiser in the Last Year:   . Arboriculturist in the  Last Year:   Transportation Needs:   . Film/video editor (Medical):   Marland Kitchen Lack of Transportation (Non-Medical):   Physical Activity:   . Days of Exercise per Week:   . Minutes of Exercise per Session:   Stress:   . Feeling of Stress :   Social Connections:   . Frequency of Communication with Friends and Family:   . Frequency of Social Gatherings with Friends and Family:   . Attends Religious Services:   . Active Member of Clubs or Organizations:   . Attends Archivist Meetings:   Marland Kitchen Marital Status:   Intimate Partner Violence:   . Fear of Current or Ex-Partner:   . Emotionally Abused:   Marland Kitchen Physically Abused:   . Sexually Abused:     FAMILY HISTORY: No family history on file.  ALLERGIES:  is allergic to penicillins.  MEDICATIONS:  Current Outpatient Medications  Medication Sig Dispense Refill  . aspirin EC 81 MG tablet Take 81 mg by mouth daily.     Marland Kitchen CHARCOAL ACTIVATED PO Take 520 mg by mouth daily.     . finasteride (PROSCAR) 5 MG tablet Take 5 mg by mouth every morning.    . Ginkgo Biloba (GINKOBA PO) Take 120 mg by mouth daily.    Marland Kitchen ibuprofen (ADVIL,MOTRIN) 200 MG tablet Take 400 mg by mouth every 6 (six) hours as needed for moderate pain.     Marland Kitchen loratadine (CLARITIN) 10 MG tablet Take 10 mg by mouth daily. For allergies    . Magnesium 500 MG CAPS Take 500 mg by mouth daily.     . Melatonin 10 MG TABS Take 10 mg by mouth at bedtime as needed (sleep).     . Omega-3 Fatty Acids (FISH OIL OMEGA-3 PO) Take 1 capsule by mouth daily.    . psyllium (METAMUCIL) 58.6 % packet Take 1 packet by mouth daily as needed (constipation).     . Tamsulosin HCl (FLOMAX) 0.4 MG CAPS Take 0.4 mg by mouth at bedtime.      No current facility-administered medications for this visit.    REVIEW OF SYSTEMS:   A 10+ POINT REVIEW OF SYSTEMS WAS OBTAINED including neurology, dermatology, psychiatry, cardiac, respiratory, lymph, extremities, GI, GU, Musculoskeletal, constitutional,  breasts, reproductive, HEENT.  All pertinent positives are noted in the HPI.  All others are negative.   PHYSICAL EXAMINATION: ECOG PERFORMANCE STATUS: 1 - Symptomatic but completely ambulatory  . Vitals:   07/23/19 1517  BP: (!) 145/80  Pulse: 77  Resp: 18  Temp: 98.8 F (37.1 C)  SpO2: 99%   Filed Weights   07/23/19 1517  Weight: 180 lb 3.2 oz (81.7 kg)   .Body mass index is 24.44 kg/m.   GENERAL:alert, in no acute distress and comfortable SKIN: no acute rashes, no significant lesions EYES: conjunctiva are pink and non-injected, sclera anicteric OROPHARYNX: MMM, no exudates, no oropharyngeal erythema or ulceration NECK: supple, no JVD LYMPH:  no palpable lymphadenopathy in the cervical, axillary or inguinal regions LUNGS: clear to auscultation b/l with normal respiratory effort HEART: regular rate & rhythm ABDOMEN:  normoactive bowel sounds , non tender, not distended. No palpable hepatosplenomegaly.  Extremity: no pedal edema PSYCH: alert & oriented x 3 with fluent speech NEURO: no focal motor/sensory deficits  LABORATORY DATA:  I have reviewed the data as listed  . CBC Latest Ref Rng & Units 07/23/2019 01/22/2019 07/03/2018  WBC 4.0 - 10.5 K/uL 6.6 6.7 6.2  Hemoglobin 13.0 - 17.0 g/dL 14.1 14.1 14.2  Hematocrit 39.0 - 52.0 % 42.6 42.1 40.2  Platelets 150 - 400 K/uL 161 163 174    . CMP Latest Ref Rng & Units 07/23/2019 01/22/2019 12/05/2017  Glucose 70 - 99 mg/dL 97 68(L) 86  BUN 8 - 23 mg/dL '15 16 15  ' Creatinine 0.61 - 1.24 mg/dL 1.01 0.99 1.06  Sodium 135 - 145 mmol/L 145 144 142  Potassium 3.5 - 5.1 mmol/L 4.0 4.1 4.7  Chloride 98 - 111 mmol/L 110 107 103  CO2 22 - 32 mmol/L '26 30 26  ' Calcium 8.9 - 10.3 mg/dL 9.0 9.2 9.3  Total Protein 6.5 - 8.1 g/dL 6.8 6.7 6.2  Total Bilirubin 0.3 - 1.2 mg/dL 0.4 0.4 0.4  Alkaline Phos 38 - 126 U/L 78 82 78  AST 15 - 41 U/L 25 20  36  ALT 0 - 44 U/L 30 21 47(H)    12/02/05 BM Bx:   12/02/05 Flow  Cytometry:     RADIOGRAPHIC STUDIES: I have personally reviewed the radiological images as listed and agreed with the findings in the report. No results found.  ASSESSMENT & PLAN:   69 y.o. male with  1. Hairy cell leukemia, currently in 3rd remission Diagnosed in January 1992 presented with constitutional symptoms and massive splenomegaly. S/p Cladribine July 1995 progression  S/p Cladribine June 2007 progression S/p Cladribine and Rituxan  PLAN: -Discussed pt labwork today, 07/23/19; blood counts and chemistries are normal, LDH at 197 -No splenomegaly upon exam  -The pt shows no clinical or lab progression/return of his hairy cell leukemia at this time.  -No indication for further treatment at this time. -Due to stability of pt's long term remission from HCL, will move follow-ups to every year. Alternating with visits to Dr. Willey Blade.  -Recommend pt f/u with Dr. Willey Blade for his shoulder discomfort, tinnitus and sciatic pain.  -Will see back in 12 months with labs  FOLLOW UP: RTC with Dr Irene Limbo with labs in 12 months   The total time spent in the appt was 20 minutes and more than 50% was on counseling and direct patient cares.  All of the patient's questions were answered with apparent satisfaction. The patient knows to call the clinic with any problems, questions or concerns.    Sullivan Lone MD [place] AAHIVMS [facility] Hematology/Oncology Physician [facility]  (Office):       [phone] (Work cell):  [phone] (Fax):           [phone]  07/23/2019 4:08 PM  I, Yevette Edwards, am acting as a scribe for Dr. Sullivan Lone.   .I have reviewed the above documentation for accuracy and completeness, and I agree with the above. Brunetta Genera MD

## 2019-07-26 ENCOUNTER — Telehealth: Payer: Self-pay | Admitting: Hematology

## 2019-07-26 NOTE — Telephone Encounter (Signed)
Scheduled per 04/05 los, patient has been called and notified. ?

## 2019-08-21 DIAGNOSIS — M5416 Radiculopathy, lumbar region: Secondary | ICD-10-CM | POA: Diagnosis not present

## 2019-08-28 DIAGNOSIS — M5416 Radiculopathy, lumbar region: Secondary | ICD-10-CM | POA: Diagnosis not present

## 2019-09-10 ENCOUNTER — Other Ambulatory Visit: Payer: Self-pay

## 2019-09-10 ENCOUNTER — Ambulatory Visit (HOSPITAL_COMMUNITY): Payer: Medicare PPO | Attending: Internal Medicine | Admitting: Physical Therapy

## 2019-09-10 ENCOUNTER — Encounter (HOSPITAL_COMMUNITY): Payer: Self-pay | Admitting: Physical Therapy

## 2019-09-10 DIAGNOSIS — M545 Low back pain, unspecified: Secondary | ICD-10-CM

## 2019-09-10 DIAGNOSIS — M6281 Muscle weakness (generalized): Secondary | ICD-10-CM | POA: Diagnosis not present

## 2019-09-10 DIAGNOSIS — R29898 Other symptoms and signs involving the musculoskeletal system: Secondary | ICD-10-CM | POA: Insufficient documentation

## 2019-09-10 DIAGNOSIS — R2689 Other abnormalities of gait and mobility: Secondary | ICD-10-CM | POA: Insufficient documentation

## 2019-09-10 NOTE — Therapy (Signed)
[place], [place], [address] Phone: [phone]   Fax:  [phone]  Physical Therapy Treatment  Patient Details  Name: Cameron Wu MRN: [id number] Date of Birth: January 18, 1951 Referring Provider (PT): [name] MD   Encounter Date: 09/10/2019  PT End of Session - 09/10/19 1438    Visit Number  1    Number of Visits  12    Date for PT Re-Evaluation  10/22/19    Authorization Type  [employer] (auth via cohere, visits based on med necessity)    Authorization Time Period  5/24-7/5 requested    Authorization - Visit Number  1    Authorization - Number of Visits  12    Progress Note Due on Visit  10    PT Start Time  0626    PT Stop Time  1346    PT Time Calculation (min)  43 min    Activity Tolerance  Patient tolerated treatment well;Patient limited by pain    Behavior During Therapy  [facility] for tasks assessed/performed       Past Medical History:  Diagnosis Date  . Abnormal liver function tests 05/03/2011  . BPH (benign prostatic hypertrophy) 05/03/2011  . Elevated transaminase level   . GERD (gastroesophageal reflux disease)   . Hairy cell leukemia   . Hairy cell leukemia    1992  . Hairy cell leukemia, in remission ([place]) 05/03/2011  . IBS (irritable bowel syndrome) 05/03/2011  . Irritable bowel syndrome    2 yrs ago, states he had inflammation around anus. Biopsy: inclusive    Past Surgical History:  Procedure Laterality Date  . BONE MARROW BIOPSY     2007  . COLONOSCOPY  08   with polyps  . COLONOSCOPY  09/30/2011   Procedure: COLONOSCOPY;  Surgeon: Rogene Houston, MD;  [facility];  Service: Endoscopy;  Laterality: N/A;  730  . COLONOSCOPY N/A 01/11/2019   Procedure: COLONOSCOPY;  Surgeon: Rogene Houston, MD;  [facility];  Service: Endoscopy;  Laterality: N/A;  100-office move to 9:30am  . HX COLON POLYPS    . TONSILLECTOMY     childhood    There were no vitals  filed for this visit.  Subjective Assessment - 09/10/19 1304    Subjective  Patient is a 69 y.o. male who presents to physical therapy with c/o right lumbar radiculopathy which began 4 weeks ago. Symptoms started in his back with a slip while carrying a firepit down a hill. He twisted his back and the next day he began having back pain with symptoms progressing to hip, knee and foot over last several weeks. From hip to knee it is sharp shooting pain, from knee to foot tingling and throbbing pain. Patient denies changes in bowel/bladder, no numbness/tingling in groin region. Symptoms increase with standing over 20 minutes, turning, and bending. Pain decreases with leaning to L while seated. He can't get comfortable at night. Patient's main goal is to get back to normal and get back to working in the yard. He does have history of back pain which he managed with heat/ice as well as handful of times with LLE radicular symptoms.    Pertinent History  hx back pain, scolosis possibly    Limitations  Standing;Lifting;Walking;House hold activities    How long can you stand comfortably?  20 minutes    How long can you walk comfortably?  unable    Patient Stated Goals  get back to normal and get back to working in the yard    Currently in Pain?  Yes    Pain Score  4    worst 8/10   Pain Location  Back    Pain Orientation  Right         [facility] PT Assessment - 09/10/19 0001      Assessment   Medical Diagnosis  R Lumbar Racidulopathy    Referring Provider (PT)  [name] MD    Onset Date/Surgical Date  08/20/19    Next MD Visit  No follow up scheduled    Prior Therapy  None      Precautions   Precautions  None      Restrictions   Weight Bearing Restrictions  No      Balance Screen   Has the patient fallen in the past 6 months  No    Has the patient had a decrease in activity level because of a fear of falling?   Yes    Is the patient reluctant to leave their home because of a fear of falling?    Yes      Prior Function   Level of Independence  Independent      Cognition   Overall Cognitive Status  Within Functional Limits for tasks assessed      Observation/Other Assessments   Observations  Ambulates without AD, limited R knee flexion extension ROM during gait, seated with L lateral lean    Focus on Therapeutic Outcomes (FOTO)   61% limited      Sensation   Light Touch  Impaired Detail    Light Touch Impaired Details  Impaired RLE    Additional Comments  L4 on R decreased      Posture/Postural Control   Posture/Postural Control  Postural limitations    Postural Limitations  Weight shift left    Posture Comments  pt states history of slight scolosis      ROM / Strength   AROM / PROM / Strength  AROM;Strength      AROM   AROM Assessment Site  Lumbar    Lumbar Flexion  25 % limited    Lumbar Extension  90% limited, pain down to back of knee    Lumbar - Right Side Bend  75 % limited, pain down to back of knee    Lumbar - Left Side Bend  0% limited     Lumbar - Right Rotation  0% limited    Lumbar - Left Rotation  0 % limited      Strength   Overall Strength Comments  pain with hip testing    Strength Assessment Site  Hip;Knee;Ankle    Right/Left Hip  Right;Left    Right Hip Flexion  4+/5    Left Hip Flexion  4/5    Right/Left Knee  Right;Left    Right Knee Flexion  5/5    Right Knee Extension  5/5    Left Knee Flexion  5/5    Left Knee Extension  5/5    Right/Left Ankle  Right;Left    Right Ankle Dorsiflexion  5/5    Left Ankle Dorsiflexion  5/5      Transfers   Five time sit to stand comments   24.03 seconds    Comments  slow, labored, intermittent UE use, relies on LLE      Ambulation/Gait   Ambulation/Gait  Yes    Ambulation/Gait Assistance  6: Modified independent (Device/Increase time)  Ambulation Distance (Feet)  150 Feet    Assistive device  None    Gait Pattern  Decreased hip/knee flexion - right;Decreased hip/knee flexion - left;Trunk flexed     Ambulation Surface  Level;Indoor    Gait Comments  2MWT, patient ambulates in crouched posture, frequently reaching for walls for support, trunk flexion, requies 2 standing rest breaks                            PT Education - 09/10/19 1316    Education Details  Patient educated on exam findings, POC, scope of physical therapy, low back pain, lumbar pathology    Person(s) Educated  Patient    Methods  Explanation;Demonstration    Comprehension  Verbalized understanding;Returned demonstration       PT Short Term Goals - 09/10/19 1455      PT SHORT TERM GOAL #1   Title  Patient will be independent with HEP in order to improve functional outcomes.    Time  3    Period  Weeks    Status  New    Target Date  10/01/19      PT SHORT TERM GOAL #2   Title  Patient will report at least 25% improvement in symptoms for improved quality of life.    Time  3    Period  Weeks    Status  New    Target Date  10/01/19        PT Long Term Goals - 09/10/19 1456      PT LONG TERM GOAL #1   Title  Patient will report at least 25% improvement in symptoms for improved quality of life.    Time  6    Period  Weeks    Status  New    Target Date  10/22/19      PT LONG TERM GOAL #2   Title  Patient will improve FOTO score by at least 5 points in order to indicate improved tolerance to activity.    Time  6    Period  Weeks    Status  New    Target Date  10/22/19      PT LONG TERM GOAL #3   Title  Patient will be able to complete 5x STS in under 11.4 seconds in order to reduce the risk of falls.    Time  6    Period  Weeks    Status  New    Target Date  10/22/19      PT LONG TERM GOAL #4   Title  Patient will demonstrate at least 25% improvement in lumbar ROM in all planes for improved ability to move for yard work.    Time  6    Period  Weeks    Status  New    Target Date  10/22/19            Plan - 09/10/19 1449    Clinical Impression Statement  Patient  is a 69 y.o. male who presents to physical therapy with c/o right lumbar radiculopathy which began 4 weeks ago. He presents with pain limited deficits in Lumbar and LE strength, ROM, endurance, postural impairments, gait, spinal mobility and functional mobility with ADL. He is having to modify and restrict ADL as indicated by FOTO score as well as subjective information and objective measures which is affecting overall participation. Patient will benefit from skilled physical therapy in order to improve  function and reduce impairment.    Personal Factors and Comorbidities  Comorbidity 2;Past/Current Experience;Behavior Pattern    Comorbidities  hx back pain, possible Scoliosis    Examination-Activity Limitations  Bathing;Bed Mobility;Bend;Carry;Dressing;Lift;Locomotion Level;Sit;Sleep;Squat;Stairs;Stand;Transfers    Examination-Participation Restrictions  Church;Cleaning;Community Activity;Driving;Meal Prep;Shop;Volunteer;Yard Work    Merchant navy officer  Evolving/Moderate complexity    Clinical Decision Making  Moderate    Rehab Potential  Good    PT Frequency  2x / week    PT Duration  6 weeks    PT Treatment/Interventions  ADLs/Self Care Home Management;Aquatic Therapy;Biofeedback;Canalith Repostioning;Cryotherapy;Electrical Stimulation;Iontophoresis 41m/ml Dexamethasone;Moist Heat;Traction;Ultrasound;DME Instruction;Gait training;Stair training;Functional mobility training;Therapeutic activities;Therapeutic exercise;Balance training;Neuromuscular re-education;Patient/family education;Manual techniques;Dry needling;Energy conservation;Splinting;Compression bandaging;Scar mobilization;Passive range of motion;Spinal Manipulations;Joint Manipulations    PT Next Visit Plan  Further assess spinal mobility and palpation, trial repeated extension, begin core and LE strengthing, trial manual therapy, initiate HEP    PT Home Exercise Plan  initiate next session    Consulted and Agree with Plan  of Care  Patient       Patient will benefit from skilled therapeutic intervention in order to improve the following deficits and impairments:  Abnormal gait, Decreased activity tolerance, Decreased balance, Decreased mobility, Decreased range of motion, Hypomobility, Decreased strength, Impaired sensation, Postural dysfunction, Improper body mechanics, Impaired flexibility, Pain  Visit Diagnosis: Low back pain, unspecified back pain laterality, unspecified chronicity, unspecified whether sciatica present  Muscle weakness (generalized)  Other abnormalities of gait and mobility  Other symptoms and signs involving the musculoskeletal system     Problem List Patient Active Problem List   Diagnosis Date Noted  . Hx of colonic polyps 10/26/2018  . H[place] cell [place], in [place] ([place] 05/03/2011  . IBS (irritable bowel syndrome) 05/03/2011  . BPH (benign prostatic hypertrophy) 05/03/2011  . Abnormal liver function tests 05/03/2011    2:59 PM, 09/10/19 AMearl LatinPT, DPT [facility] [place] [address]Phone: [phone]  Fax:  [phone] Name: Cameron LUGINBILLMRN: [id number] of Birth: [birth year]

## 2019-09-11 ENCOUNTER — Encounter (HOSPITAL_COMMUNITY): Payer: Self-pay | Admitting: Physical Therapy

## 2019-09-11 ENCOUNTER — Ambulatory Visit (HOSPITAL_COMMUNITY): Payer: Medicare PPO | Admitting: Physical Therapy

## 2019-09-11 DIAGNOSIS — M545 Low back pain, unspecified: Secondary | ICD-10-CM

## 2019-09-11 DIAGNOSIS — R2689 Other abnormalities of gait and mobility: Secondary | ICD-10-CM | POA: Diagnosis not present

## 2019-09-11 DIAGNOSIS — M6281 Muscle weakness (generalized): Secondary | ICD-10-CM

## 2019-09-11 DIAGNOSIS — R29898 Other symptoms and signs involving the musculoskeletal system: Secondary | ICD-10-CM

## 2019-09-11 NOTE — Therapy (Signed)
[place], [place], [address] Phone: [phone]   Fax:  [phone]  Physical Therapy Treatment  Patient Details  Name: Cameron Wu MRN: [id number] Date of Birth: 1950/11/18 Referring Provider (PT): [name] MD   Encounter Date: 09/11/2019  PT End of Session - 09/11/19 0857    Visit Number  2    Number of Visits  12    Date for PT Re-Evaluation  10/22/19    Authorization Type  [employer] (auth via cohere, visits based on med necessity)    Authorization Time Period  5/24-7/5 requested    Authorization - Visit Number  2    Authorization - Number of Visits  12    Progress Note Due on Visit  10    PT Start Time  0900    PT Stop Time  0940    PT Time Calculation (min)  40 min    Activity Tolerance  Patient tolerated treatment well;Patient limited by pain    Behavior During Therapy  [facility] for tasks assessed/performed       Past Medical History:  Diagnosis Date  . Abnormal liver function tests 05/03/2011  . BPH (benign prostatic hypertrophy) 05/03/2011  . Elevated transaminase level   . GERD (gastroesophageal reflux disease)   . Hairy cell leukemia   . Hairy cell leukemia    1992  . Hairy cell leukemia, in remission ([place]) 05/03/2011  . IBS (irritable bowel syndrome) 05/03/2011  . Irritable bowel syndrome    2 yrs ago, states he had inflammation around anus. Biopsy: inclusive    Past Surgical History:  Procedure Laterality Date  . BONE MARROW BIOPSY     2007  . COLONOSCOPY  08   with polyps  . COLONOSCOPY  09/30/2011   Procedure: COLONOSCOPY;  Surgeon: Rogene Houston, MD;  [facility];  Service: Endoscopy;  Laterality: N/A;  730  . COLONOSCOPY N/A 01/11/2019   Procedure: COLONOSCOPY;  Surgeon: Rogene Houston, MD;  [facility];  Service: Endoscopy;  Laterality: N/A;  100-office move to 9:30am  . HX COLON POLYPS    . TONSILLECTOMY     childhood    There were no vitals  filed for this visit.  Subjective Assessment - 09/11/19 0857    Subjective  Patient states he is the same at yesterday. He is having pain in his hamstring region and tingling feeling in calf into foot.    Pertinent History  hx back pain, scolosis possibly    Limitations  Standing;Lifting;Walking;House hold activities    How long can you stand comfortably?  20 minutes    How long can you walk comfortably?  unable    Patient Stated Goals  get back to normal and get back to working in the yard    Currently in Pain?  Yes    Pain Score  6     Pain Location  Back         [facility] PT Assessment - 09/11/19 0001      Palpation   Spinal mobility  grossly hypomobile, pain lumbar PA glides locally, hypomobile thoracic CPA and right and Left UPA, Painful R and L upa in lumbar spine L1-L5    Palpation comment  TTP lumbar paraspinals, R glutes, R piriformis                    [facility] Adult PT Treatment/Exercise - 09/11/19 0001  Exercises   Exercises  Lumbar      Lumbar Exercises: Quadruped   Other Quadruped Lumbar Exercises  prone on elbows 5x 10 second hold x  5sets    Other Quadruped Lumbar Exercises  Prone press ups 3x10      Manual Therapy   Manual Therapy  --    Manual therapy comments  --             PT Education - 09/11/19 0857    Education Details  Patient educated on initial HEP, centralization, repeated movement, peripheralization of symptoms, dosing of exercises    Person(s) Educated  Patient    Methods  Explanation;Demonstration;Handout    Comprehension  Verbalized understanding;Returned demonstration       PT Short Term Goals - 09/11/19 0858      PT SHORT TERM GOAL #1   Title  Patient will be independent with HEP in order to improve functional outcomes.    Time  3    Period  Weeks    Status  On-going    Target Date  10/01/19      PT SHORT TERM GOAL #2   Title  Patient will report at least 25% improvement in symptoms for improved quality of life.     Time  3    Period  Weeks    Status  On-going    Target Date  10/01/19        PT Long Term Goals - 09/11/19 0858      PT LONG TERM GOAL #1   Title  Patient will report at least 25% improvement in symptoms for improved quality of life.    Time  6    Period  Weeks    Status  On-going      PT LONG TERM GOAL #2   Title  Patient will improve FOTO score by at least 5 points in order to indicate improved tolerance to activity.    Time  6    Period  Weeks    Status  On-going      PT LONG TERM GOAL #3   Title  Patient will be able to complete 5x STS in under 11.4 seconds in order to reduce the risk of falls.    Time  6    Period  Weeks    Status  On-going      PT LONG TERM GOAL #4   Title  Patient will demonstrate at least 25% improvement in lumbar ROM in all planes for improved ability to move for yard work.    Time  6    Period  Weeks    Status  On-going            Plan - 09/11/19 8032    Clinical Impression Statement  Upon further assessment, patient has hyperactive lumbar paraspinals that are tender as well as R glute and piriformis. He demonstrates hypomobile thoracic and lumbar spine with c/o local symptoms with lumbar PA without change in radicular symptoms. He tolerates prone on elbows well with decrease in low back symptoms during. He requires min verbal cueing for relaxing lumbar paraspinals with prone on elbows and press ups. Patient's UE fatigues quickly with press up exercises. Patient states decrease in calf symptoms following but experiences no change in back/posterior leg symptoms. He c/o increase intensity in posterior knee symptoms following but improvement in back stiffness. Patient will continue to benefit from skilled physical therapy in order to reduce impairment and improve function.  Personal Factors and Comorbidities  Comorbidity 2;Past/Current Experience;Behavior Pattern    Comorbidities  hx back pain, possible Scoliosis    Examination-Activity  Limitations  Bathing;Bed Mobility;Bend;Carry;Dressing;Lift;Locomotion Level;Sit;Sleep;Squat;Stairs;Stand;Transfers    Examination-Participation Restrictions  Church;Cleaning;Community Activity;Driving;Meal Prep;Shop;Volunteer;Yard Work    Rehab Potential  Good    PT Frequency  2x / week    PT Duration  6 weeks    PT Treatment/Interventions  ADLs/Self Care Home Management;Aquatic Therapy;Biofeedback;Canalith Repostioning;Cryotherapy;Electrical Stimulation;Iontophoresis 85m/ml Dexamethasone;Moist Heat;Traction;Ultrasound;DME Instruction;Gait training;Stair training;Functional mobility training;Therapeutic activities;Therapeutic exercise;Balance training;Neuromuscular re-education;Patient/family education;Manual techniques;Dry needling;Energy conservation;Splinting;Compression bandaging;Scar mobilization;Passive range of motion;Spinal Manipulations;Joint Manipulations    PT Next Visit Plan  assess response to initial HEP/extension based exercises, modify dosing if needed or discontinue if not improving symptoms. begin core and LE strengthing, trial manual therapy if needed    PT Home Exercise Plan  5/25 prone on elbows, prone press ups    Consulted and Agree with Plan of Care  Patient       Patient will benefit from skilled therapeutic intervention in order to improve the following deficits and impairments:  Abnormal gait, Decreased activity tolerance, Decreased balance, Decreased mobility, Decreased range of motion, Hypomobility, Decreased strength, Impaired sensation, Postural dysfunction, Improper body mechanics, Impaired flexibility, Pain  Visit Diagnosis: Low back pain, unspecified back pain laterality, unspecified chronicity, unspecified whether sciatica present  Muscle weakness (generalized)  Other abnormalities of gait and mobility  Other symptoms and signs involving the musculoskeletal system     Problem List Patient Active Problem List   Diagnosis Date Noted  . Hx of colonic  polyps 10/26/2018  . Hairy cell leukemia, in remission ([place] 05/03/2011  . IBS (irritable bowel syndrome) 05/03/2011  . BPH (benign prostatic hypertrophy) 05/03/2011  . Abnormal liver function tests 05/03/2011   9:48 AM, 09/11/19 AMearl LatinPT, DPT Physical Therap[facility] [address]Phone: [phone]  Fax:  [phone] Name: Cameron LEFFERTSMRN: [id number] of Birth: [birth year]

## 2019-09-19 ENCOUNTER — Ambulatory Visit (HOSPITAL_COMMUNITY): Payer: Medicare PPO | Attending: Internal Medicine

## 2019-09-19 ENCOUNTER — Encounter (HOSPITAL_COMMUNITY): Payer: Self-pay

## 2019-09-19 ENCOUNTER — Other Ambulatory Visit: Payer: Self-pay

## 2019-09-19 DIAGNOSIS — M545 Low back pain, unspecified: Secondary | ICD-10-CM

## 2019-09-19 DIAGNOSIS — R2689 Other abnormalities of gait and mobility: Secondary | ICD-10-CM | POA: Insufficient documentation

## 2019-09-19 DIAGNOSIS — M6281 Muscle weakness (generalized): Secondary | ICD-10-CM

## 2019-09-19 DIAGNOSIS — R29898 Other symptoms and signs involving the musculoskeletal system: Secondary | ICD-10-CM | POA: Insufficient documentation

## 2019-09-19 NOTE — Therapy (Signed)
[place], [place], [address] Phone: [phone]   Fax:  [phone]  Physical Therapy Treatment  Patient Details  Name: Cameron Wu MRN: [id number] Date of Birth: 11-16-1950 Referring Provider (PT): [name] MD   Encounter Date: 09/19/2019  PT End of Session - 09/19/19 1523    Visit Number  3    Number of Visits  12    Date for PT Re-Evaluation  10/22/19    Authorization Type  [employer] (auth via cohere, visits based on med necessity)    Authorization Time Period  12 visits approved 5/24-->10/22/19    Authorization - Visit Number  3    Authorization - Number of Visits  12    Progress Note Due on Visit  10    PT Start Time  1449    PT Stop Time  1530    PT Time Calculation (min)  41 min    Activity Tolerance  Patient tolerated treatment well;Patient limited by pain;No increased pain    Behavior During Therapy  WFL for tasks assessed/performed       Past Medical History:  Diagnosis Date  . Abnormal liver function tests 05/03/2011  . BPH (benign prostatic hypertrophy) 05/03/2011  . Elevated transaminase level   . GERD (gastroesophageal reflux disease)   . Hairy cell leukemia   . Hairy cell leukemia    1992  . Hairy cell leukemia, in remission ([place]) 05/03/2011  . IBS (irritable bowel syndrome) 05/03/2011  . Irritable bowel syndrome    2 yrs ago, states he had inflammation around anus. Biopsy: inclusive    Past Surgical History:  Procedure Laterality Date  . BONE MARROW BIOPSY     2007  . COLONOSCOPY  08   with polyps  . COLONOSCOPY  09/30/2011   Procedure: COLONOSCOPY;  Surgeon: Rogene Houston, MD;  [facility];  Service: Endoscopy;  Laterality: N/A;  730  . COLONOSCOPY N/A 01/11/2019   Procedure: COLONOSCOPY;  Surgeon: Rogene Houston, MD;  [facility];  Service: Endoscopy;  Laterality: N/A;  100-office move to 9:30am  . HX COLON POLYPS    . TONSILLECTOMY     childhood    There were no vitals filed for this visit.  Subjective Assessment - 09/19/19 1455    Subjective  Pt stated he feels good following the prone exercises at home.  Reports LBP, Rt hip and posterior radicular symptoms to knee and lateral feeling in calf into foot.    Pertinent History  hx back pain, scolosis possibly    Patient Stated Goals  get back to normal and get back to working in the yard    Currently in Pain?  Yes    Pain Score  8     Pain Location  Back    Pain Orientation  Right    Pain Descriptors / Indicators  Tightness    Pain Type  Chronic pain    Pain Onset  More than a month ago    Pain Frequency  Intermittent    Aggravating Factors   pressure on Rt side, weight bearing    Pain Relieving Factors  hot showers, cream, prone exercises    Effect of Pain on Daily Activities  limits                        [facility] Adult PT Treatment/Exercise - 09/19/19 0001      Exercises  Exercises  Lumbar      Lumbar Exercises: Stretches   Lower Trunk Rotation  5 reps;10 seconds    Standing Extension  5 reps    Standing Extension Limitations  reports pain with movement    Prone on Elbows Stretch  1 rep    Prone on Elbows Stretch Limitations  2 minutes    Press Ups  5 reps;10 seconds    Other Lumbar Stretch Exercise  standing lumbar rotation 10x; reports decreased pain with reps      Lumbar Exercises: Quadruped   Other Quadruped Lumbar Exercises  rotation 5x each direction    Other Quadruped Lumbar Exercises  heel squeeze 5x 5" holds      Manual Therapy   Manual Therapy  Soft tissue mobilization    Manual therapy comments  Manual complete separate than rest of tx    Soft tissue mobilization  prone: lumbar paraspinal                PT Short Term Goals - 09/11/19 0858      PT SHORT TERM GOAL #1   Title  Patient will be independent with HEP in order to improve functional outcomes.    Time  3    Period  Weeks    Status  On-going    Target  Date  10/01/19      PT SHORT TERM GOAL #2   Title  Patient will report at least 25% improvement in symptoms for improved quality of life.    Time  3    Period  Weeks    Status  On-going    Target Date  10/01/19        PT Long Term Goals - 09/11/19 0858      PT LONG TERM GOAL #1   Title  Patient will report at least 25% improvement in symptoms for improved quality of life.    Time  6    Period  Weeks    Status  On-going      PT LONG TERM GOAL #2   Title  Patient will improve FOTO score by at least 5 points in order to indicate improved tolerance to activity.    Time  6    Period  Weeks    Status  On-going      PT LONG TERM GOAL #3   Title  Patient will be able to complete 5x STS in under 11.4 seconds in order to reduce the risk of falls.    Time  6    Period  Weeks    Status  On-going      PT LONG TERM GOAL #4   Title  Patient will demonstrate at least 25% improvement in lumbar ROM in all planes for improved ability to move for yard work.    Time  6    Period  Weeks    Status  On-going            Plan - 09/19/19 1524    Clinical Impression Statement  Began session with prone exercises wiht reports of lumbar pain resolved and reduced radicular symptoms.  Added manual soft tissue mobilization to address restricitons lumbar paraspinals.  Added lumbar and hip mobility exercises with reoprts of pain reduced.  Trial with bridges to improve extension with reports of pain, good tolerance with gluteal squeezes.  EOS pt reports some relief following manual STM, continues to have pain posterior Rt knee.  Reviewed bed mobility with cueing, will need further  education on this next session.    Personal Factors and Comorbidities  Comorbidity 2;Past/Current Experience;Behavior Pattern    Comorbidities  hx back pain, possible Scoliosis    Examination-Activity Limitations  Bathing;Bed Mobility;Bend;Carry;Dressing;Lift;Locomotion Level;Sit;Sleep;Squat;Stairs;Stand;Transfers     Examination-Participation Restrictions  Church;Cleaning;Community Activity;Driving;Meal Prep;Shop;Volunteer;Yard Work    [employer]  Evolving/Moderate complexity    Clinical Decision Making  Moderate    Rehab Potential  Good    PT Frequency  2x / week    PT Duration  6 weeks    PT Treatment/Interventions  ADLs/Self Care Home Management;Aquatic Therapy;Biofeedback;Canalith Repostioning;Cryotherapy;Electrical Stimulation;Iontophoresis 80m/ml Dexamethasone;Moist Heat;Traction;Ultrasound;DME Instruction;Gait training;Stair training;Functional mobility training;Therapeutic activities;Therapeutic exercise;Balance training;Neuromuscular re-education;Patient/family education;Manual techniques;Dry needling;Energy conservation;Splinting;Compression bandaging;Scar mobilization;Passive range of motion;Spinal Manipulations;Joint Manipulations    PT Next Visit Plan  F/U with manual;  Add prone hip extension next time and assess piriformis tightness,  Extension based exercises, modify dosing if needed or discontinue if not improving symptoms. begin core and LE strengthing, trial manual therapy if needed    PT Home Exercise Plan  5/25 prone on elbows, prone press ups       Patient will benefit from skilled therapeutic intervention in order to improve the following deficits and impairments:  Abnormal gait, Decreased activity tolerance, Decreased balance, Decreased mobility, Decreased range of motion, Hypomobility, Decreased strength, Impaired sensation, Postural dysfunction, Improper body mechanics, Impaired flexibility, Pain  Visit Diagnosis: Other abnormalities of gait and mobility  Other symptoms and signs involving the musculoskeletal system  Muscle weakness (generalized)  Low back pain, unspecified back pain laterality, unspecified chronicity, unspecified whether sciatica present     Problem List Patient Active Problem List   Diagnosis Date Noted  . Hx of colonic polyps  10/26/2018  . Hairy cell leukemia, in remission ([place] 05/03/2011  . IBS (irritable bowel syndrome) 05/03/2011  . BPH (benign prostatic hypertrophy) 05/03/2011  . Abnormal liver function tests 05/03/2011   CIhor Austin LPTA/CLT; CBIS [phone] CAldona Lento6/05/2019, 7:10 PM  [place] [address]Phone: [phone]  Fax:  [phone] Name: Cameron UPCHURCHMRN: [id number] of Birth: [birth year]

## 2019-09-25 ENCOUNTER — Encounter (HOSPITAL_COMMUNITY): Payer: Self-pay | Admitting: Physical Therapy

## 2019-09-25 ENCOUNTER — Ambulatory Visit (HOSPITAL_COMMUNITY): Payer: Medicare PPO | Admitting: Physical Therapy

## 2019-09-25 ENCOUNTER — Other Ambulatory Visit: Payer: Self-pay

## 2019-09-25 DIAGNOSIS — M545 Low back pain, unspecified: Secondary | ICD-10-CM

## 2019-09-25 DIAGNOSIS — R2689 Other abnormalities of gait and mobility: Secondary | ICD-10-CM | POA: Diagnosis not present

## 2019-09-25 DIAGNOSIS — R29898 Other symptoms and signs involving the musculoskeletal system: Secondary | ICD-10-CM | POA: Diagnosis not present

## 2019-09-25 DIAGNOSIS — M6281 Muscle weakness (generalized): Secondary | ICD-10-CM

## 2019-09-25 NOTE — Therapy (Signed)
[facility] [address] [place], [place], [address] Phone: [phone]   Fax:  [phone]  Physical Therapy Treatment  Patient Details  Name: Cameron Wu MRN: [id number] Date of Birth: 06/11/1950 Referring Provider (PT): Roy Fagan MD   Encounter Date: 09/25/2019  PT End of Session - 09/25/19 1357    Visit Number  4    Number of Visits  12    Date for PT Re-Evaluation  10/22/19    Authorization Type  [employer] (auth via cohere, visits based on med necessity)    Authorization Time Period  12 visits approved 5/24-->10/22/19    Authorization - Visit Number  4    Authorization - Number of Visits  12    Progress Note Due on Visit  10    PT Start Time  1345    PT Stop Time  1430    PT Time Calculation (min)  45 min    Activity Tolerance  Patient tolerated treatment well;Patient limited by pain;No increased pain    Behavior During Therapy  WFL for tasks assessed/performed       Past Medical History:  Diagnosis Date  . Abnormal liver function tests 05/03/2011  . BPH (benign prostatic hypertrophy) 05/03/2011  . Elevated transaminase level   . GERD (gastroesophageal reflux disease)   . Hairy cell leukemia   . Hairy cell leukemia    1992  . Hairy cell leukemia, in remission (HCC) 05/03/2011  . IBS (irritable bowel syndrome) 05/03/2011  . Irritable bowel syndrome    2 yrs ago, states he had inflammation around anus. Biopsy: inclusive    Past Surgical History:  Procedure Laterality Date  . BONE MARROW BIOPSY     2007  . COLONOSCOPY  08   with polyps  . COLONOSCOPY  09/30/2011   Procedure: COLONOSCOPY;  Surgeon: Najeeb U Rehman, MD;  [facility];  Service: Endoscopy;  Laterality: N/A;  730  . COLONOSCOPY N/A 01/11/2019   Procedure: COLONOSCOPY;  Surgeon: Rehman, Najeeb U, MD;  Location: [facility];  Service: Endoscopy;  Laterality: N/A;  100-office move to 9:30am  . HX COLON POLYPS    . TONSILLECTOMY      childhood    There were no vitals filed for this visit.  Subjective Assessment - 09/25/19 1346    Subjective  Patient states he feels he is getting better. In the morning he is doing good but in the day he starts bending more and he notices increase in symptoms in LE. He is doing exercises 3 x day. He notices benefit from exercises in the morning but they make it worse later. He is currently having symptoms behind his R knee. His back is a lot better.    Pertinent History  hx back pain, scolosis possibly    Patient Stated Goals  get back to normal and get back to working in the yard    Currently in Pain?  Yes    Pain Score  5     Pain Onset  More than a month ago                        [facility] Adult PT Treatment/Exercise - 09/25/19 0001      Lumbar Exercises: Stretches   Prone on Elbows Stretch  2 reps    Prone on Elbows Stretch Limitations  2 minutes    Press Ups  10 reps        Manual Therapy   Manual Therapy  Soft tissue mobilization;Joint mobilization    Manual therapy comments  Manual complete separate than rest of tx    Joint Mobilization  Lumbar R and L UPA L4-L5    Soft tissue mobilization  prone: lumbar paraspinal              PT Education - 09/25/19 1356    Education Details  Patient educated on HEP, mechanics of exercise, lumbar pathology, imaging, increasing frequency of exercises, limiting flexion activity, passive prone press ups.    Person(s) Educated  Patient    Methods  Explanation;Demonstration    Comprehension  Verbalized understanding;Returned demonstration       PT Short Term Goals - 09/11/19 0858      PT SHORT TERM GOAL #1   Title  Patient will be independent with HEP in order to improve functional outcomes.    Time  3    Period  Weeks    Status  On-going    Target Date  10/01/19      PT SHORT TERM GOAL #2   Title  Patient will report at least 25% improvement in symptoms for improved quality of life.    Time  3    Period  Weeks     Status  On-going    Target Date  10/01/19        PT Long Term Goals - 09/11/19 0858      PT LONG TERM GOAL #1   Title  Patient will report at least 25% improvement in symptoms for improved quality of life.    Time  6    Period  Weeks    Status  On-going      PT LONG TERM GOAL #2   Title  Patient will improve FOTO score by at least 5 points in order to indicate improved tolerance to activity.    Time  6    Period  Weeks    Status  On-going      PT LONG TERM GOAL #3   Title  Patient will be able to complete 5x STS in under 11.4 seconds in order to reduce the risk of falls.    Time  6    Period  Weeks    Status  On-going      PT LONG TERM GOAL #4   Title  Patient will demonstrate at least 25% improvement in lumbar ROM in all planes for improved ability to move for yard work.    Time  6    Period  Weeks    Status  On-going            Plan - 09/25/19 1430    Clinical Impression Statement  Patient not experiencing low back symptoms at beginning of session. Patient has increase in LE symptoms with mobilizations at L4-L5 with R UPA with increasing intensity of pain into posterior knee. He experiences vague increase and decrease in symptoms with L UPA. Patient states increase in symptoms decreases after mobilizations end. Patient requires verbal cueing for decreasing lumbar muscular activation with extension exercise with limited carryover. Patient has increase in symptoms with standing lumbar extension at wall. He is educated on increasing frequency of prone exercises at home and monitoring symptoms to assess for effectiveness. Patient also educated on limiting flexion activities while at home. Patient will continue to benefit from skilled physical therapy in order to reduce impairment and improve function.    Personal Factors and Comorbidities  Comorbidity 2;Past/Current   Experience;Behavior Pattern    Comorbidities  hx back pain, possible Scoliosis    Examination-Activity  Limitations  Bathing;Bed Mobility;Bend;Carry;Dressing;Lift;Locomotion Level;Sit;Sleep;Squat;Stairs;Stand;Transfers    Examination-Participation Restrictions  Church;Cleaning;Community Activity;Driving;Meal Prep;Shop;Volunteer;Yard Work    Stability/Clinical Decision Making  Evolving/Moderate complexity    Rehab Potential  Good    PT Frequency  2x / week    PT Duration  6 weeks    PT Treatment/Interventions  ADLs/Self Care Home Management;Aquatic Therapy;Biofeedback;Canalith Repostioning;Cryotherapy;Electrical Stimulation;Iontophoresis 4mg/ml Dexamethasone;Moist Heat;Traction;Ultrasound;DME Instruction;Gait training;Stair training;Functional mobility training;Therapeutic activities;Therapeutic exercise;Balance training;Neuromuscular re-education;Patient/family education;Manual techniques;Dry needling;Energy conservation;Splinting;Compression bandaging;Scar mobilization;Passive range of motion;Spinal Manipulations;Joint Manipulations    PT Next Visit Plan  F/U with manual;  Add prone hip extension next time and assess piriformis tightness,  Extension based exercises, modify dosing if needed or discontinue if not improving symptoms. begin core and LE strengthing, trial manual therapy if needed    PT Home Exercise Plan  5/25 prone on elbows, prone press ups       Patient will benefit from skilled therapeutic intervention in order to improve the following deficits and impairments:  Abnormal gait, Decreased activity tolerance, Decreased balance, Decreased mobility, Decreased range of motion, Hypomobility, Decreased strength, Impaired sensation, Postural dysfunction, Improper body mechanics, Impaired flexibility, Pain  Visit Diagnosis: Other abnormalities of gait and mobility  Muscle weakness (generalized)  Other symptoms and signs involving the musculoskeletal system  Low back pain, unspecified back pain laterality, unspecified chronicity, unspecified whether sciatica present     Problem  List Patient Active Problem List   Diagnosis Date Noted  . Hx of colonic polyps 10/26/2018  . Hairy cell leukemia, in remission (HCC) 05/03/2011  . IBS (irritable bowel syndrome) 05/03/2011  . BPH (benign prostatic hypertrophy) 05/03/2011  . Abnormal liver function tests 05/03/2011   2:38 PM, 09/25/19  S.  PT, DPT Physical Therapist at [facility]   [facility] [address] [place], [place], [address] Phone: [phone]   Fax:  [phone]  Name: Cameron Wu MRN: [id number] Date of Birth: 09/13/1950   

## 2019-09-27 ENCOUNTER — Other Ambulatory Visit: Payer: Self-pay

## 2019-09-27 ENCOUNTER — Encounter (HOSPITAL_COMMUNITY): Payer: Self-pay | Admitting: Physical Therapy

## 2019-09-27 ENCOUNTER — Ambulatory Visit (HOSPITAL_COMMUNITY): Payer: Medicare PPO | Admitting: Physical Therapy

## 2019-09-27 DIAGNOSIS — M6281 Muscle weakness (generalized): Secondary | ICD-10-CM | POA: Diagnosis not present

## 2019-09-27 DIAGNOSIS — M545 Low back pain, unspecified: Secondary | ICD-10-CM

## 2019-09-27 DIAGNOSIS — R2689 Other abnormalities of gait and mobility: Secondary | ICD-10-CM

## 2019-09-27 DIAGNOSIS — R29898 Other symptoms and signs involving the musculoskeletal system: Secondary | ICD-10-CM | POA: Diagnosis not present

## 2019-09-27 NOTE — Therapy (Signed)
[place], [place], [address] Phone: [phone]   Fax:  [phone]  Physical Therapy Treatment  Patient Details  Name: Cameron Wu MRN: [id number] Date of Birth: 06/15/1950 Referring Provider (PT): [name] MD   Encounter Date: 09/27/2019   PT End of Session - 09/27/19 1258    Visit Number 5    Number of Visits 12    Date for PT Re-Evaluation 10/22/19    Authorization Type [employer] (auth via cohere, visits based on med necessity)    Authorization Time Period 12 visits approved 5/24-->10/22/19    Authorization - Visit Number 5    Authorization - Number of Visits 12    Progress Note Due on Visit 10    PT Start Time 1300    PT Stop Time 1340    PT Time Calculation (min) 40 min    Activity Tolerance Patient tolerated treatment well;Patient limited by pain;No increased pain    Behavior During Therapy WFL for tasks assessed/performed           Past Medical History:  Diagnosis Date  . Abnormal liver function tests 05/03/2011  . BPH (benign prostatic hypertrophy) 05/03/2011  . Elevated transaminase level   . GERD (gastroesophageal reflux disease)   . Hairy cell leukemia   . Hairy cell leukemia    1992  . Hairy cell leukemia, in remission ([place]) 05/03/2011  . IBS (irritable bowel syndrome) 05/03/2011  . Irritable bowel syndrome    2 yrs ago, states he had inflammation around anus. Biopsy: inclusive    Past Surgical History:  Procedure Laterality Date  . BONE MARROW BIOPSY     2007  . COLONOSCOPY  08   with polyps  . COLONOSCOPY  09/30/2011   Procedure: COLONOSCOPY;  Surgeon: Rogene Houston, MD;  [facility];  Service: Endoscopy;  Laterality: N/A;  730  . COLONOSCOPY N/A 01/11/2019   Procedure: COLONOSCOPY;  Surgeon: Rogene Houston, MD;  [facility];  Service: Endoscopy;  Laterality: N/A;  100-office move to 9:30am  . HX COLON POLYPS    . TONSILLECTOMY     childhood     There were no vitals filed for this visit.   Subjective Assessment - 09/27/19 1257    Subjective Patient states he is having a good morning. He did the exercises and back was sore last night and felt good this morning. He did press ups 4x yesterday. RLE symptoms are still the same intensity. Leg pain worse in the morning which improves as day progresses.    Pertinent History hx back pain, scolosis possibly    Patient Stated Goals get back to normal and get back to working in the yard    Currently in Pain? Yes    Pain Score 4     Pain Location Back   back and leg   Pain Onset More than a month ago                             [facility] Adult PT Treatment/Exercise - 09/27/19 0001      Lumbar Exercises: Seated   Other Seated Lumbar Exercises sciatic nerve glides 2x10       Lumbar Exercises: Supine   Ab Set 20 reps;5 seconds    Glut Set 10 reps;5 seconds    Bridge 5 reps    Bridge Limitations with prior glute set, hamsting cramping  so discontinued      Lumbar Exercises: Prone   Straight Leg Raise 5 reps;5 seconds    Straight Leg Raises Limitations 2 sets bilateral      Manual Therapy   Manual Therapy Soft tissue mobilization;Joint mobilization    Manual therapy comments Manual complete separate than rest of tx    Joint Mobilization Lumbar R and L UPA L4-L5    Soft tissue mobilization prone: lumbar paraspinal                   PT Education - 09/27/19 1259    Education Details Patient educated on HEP, mechanics of exercise,    Person(s) Educated Patient    Methods Explanation;Demonstration    Comprehension Verbalized understanding;Returned demonstration            PT Short Term Goals - 09/11/19 0858      PT SHORT TERM GOAL #1   Title Patient will be independent with HEP in order to improve functional outcomes.    Time 3    Period Weeks    Status On-going    Target Date 10/01/19      PT SHORT TERM GOAL #2   Title Patient will report at least  25% improvement in symptoms for improved quality of life.    Time 3    Period Weeks    Status On-going    Target Date 10/01/19             PT Long Term Goals - 09/11/19 0858      PT LONG TERM GOAL #1   Title Patient will report at least 25% improvement in symptoms for improved quality of life.    Time 6    Period Weeks    Status On-going      PT LONG TERM GOAL #2   Title Patient will improve FOTO score by at least 5 points in order to indicate improved tolerance to activity.    Time 6    Period Weeks    Status On-going      PT LONG TERM GOAL #3   Title Patient will be able to complete 5x STS in under 11.4 seconds in order to reduce the risk of falls.    Time 6    Period Weeks    Status On-going      PT LONG TERM GOAL #4   Title Patient will demonstrate at least 25% improvement in lumbar ROM in all planes for improved ability to move for yard work.    Time 6    Period Weeks    Status On-going                 Plan - 09/27/19 1258    Clinical Impression Statement Patient c/o local pain with L UPA mobilizations with no change in LE symptoms. He has vague LE stretching with slump test which overall appears negative bilaterally besides "stretch". He requires frequent verbal cueing for mechanics of sciatic nerve glides with no change in symptoms following. He tolerates glute strengthening exercise but fatigues quickly. He is able to achieve glute isometrics in hooklying but has cramping with bridge exercise in hamstring so he is unable to complete. He requires verbal and tactile cueing for positioning and mechanics of ab set in hooklying. Patient educated on the purpose and mechanics of all exercises today. Patient ambulating with improving posture and gait pattern following session. Patient will continue to benefit from skilled physical therapy in order to reduce impairment and  improve function.    Personal Factors and Comorbidities Comorbidity 2;Past/Current  Experience;Behavior Pattern    Comorbidities hx back pain, possible Scoliosis    Examination-Activity Limitations Bathing;Bed Mobility;Bend;Carry;Dressing;Lift;Locomotion Level;Sit;Sleep;Squat;Stairs;Stand;Transfers    Examination-Participation Restrictions Church;Cleaning;Community Activity;Driving;Meal Prep;Shop;Volunteer;Yard Work    [employer] Evolving/Moderate complexity    Rehab Potential Good    PT Frequency 2x / week    PT Duration 6 weeks    PT Treatment/Interventions ADLs/Self Care Home Management;Aquatic Therapy;Biofeedback;Canalith Repostioning;Cryotherapy;Electrical Stimulation;Iontophoresis 40m/ml Dexamethasone;Moist Heat;Traction;Ultrasound;DME Instruction;Gait training;Stair training;Functional mobility training;Therapeutic activities;Therapeutic exercise;Balance training;Neuromuscular re-education;Patient/family education;Manual techniques;Dry needling;Energy conservation;Splinting;Compression bandaging;Scar mobilization;Passive range of motion;Spinal Manipulations;Joint Manipulations    PT Next Visit Plan assess piriformis tightness,  Extension based exercises, modify dosing if needed or discontinue if not improving symptoms. continue core and LE strengthing, continue manual therapy if needed    PT Home Exercise Plan 5/25 prone on elbows, prone press ups 6/10 hip extension, ab sets           Patient will benefit from skilled therapeutic intervention in order to improve the following deficits and impairments:  Abnormal gait, Decreased activity tolerance, Decreased balance, Decreased mobility, Decreased range of motion, Hypomobility, Decreased strength, Impaired sensation, Postural dysfunction, Improper body mechanics, Impaired flexibility, Pain  Visit Diagnosis: Other abnormalities of gait and mobility  Muscle weakness (generalized)  Other symptoms and signs involving the musculoskeletal system  Low back pain, unspecified back pain laterality,  unspecified chronicity, unspecified whether sciatica present     Problem List Patient Active Problem List   Diagnosis Date Noted  . Hx of colonic polyps 10/26/2018  . Hairy cell leukemia, in remission ([facility] 05/03/2011  . IBS (irritable bowel syndrome) 05/03/2011  . BPH (benign prostatic hypertrophy) 05/03/2011  . Abnormal liver function tests 05/03/2011   1:52 PM, 09/27/19 AMearl LatinPT, DPT [place] [address]Phone: [phone]  Fax:  [phone] Name: Cameron BOZZIMRN: [id number] of Birth: 724-Aug-1952

## 2019-10-02 ENCOUNTER — Ambulatory Visit (HOSPITAL_COMMUNITY): Payer: Medicare PPO

## 2019-10-02 ENCOUNTER — Encounter (HOSPITAL_COMMUNITY): Payer: Self-pay

## 2019-10-02 ENCOUNTER — Other Ambulatory Visit: Payer: Self-pay

## 2019-10-02 DIAGNOSIS — M6281 Muscle weakness (generalized): Secondary | ICD-10-CM | POA: Diagnosis not present

## 2019-10-02 DIAGNOSIS — R2689 Other abnormalities of gait and mobility: Secondary | ICD-10-CM

## 2019-10-02 DIAGNOSIS — R29898 Other symptoms and signs involving the musculoskeletal system: Secondary | ICD-10-CM | POA: Diagnosis not present

## 2019-10-02 DIAGNOSIS — M545 Low back pain, unspecified: Secondary | ICD-10-CM

## 2019-10-02 NOTE — Therapy (Signed)
[place], [place], [address] Phone: [phone]   Fax:  [phone]  Physical Therapy Treatment  Patient Details  Name: Cameron Wu MRN: [id number] Date of Birth: 1951/03/02 Referring Provider (PT): [name] MD   Encounter Date: 10/02/2019   PT End of Session - 10/02/19 1333    Visit Number 6    Number of Visits 12    Date for PT Re-Evaluation 10/22/19    Authorization Type [employer] (auth via cohere, visits based on med necessity)    Authorization Time Period 12 visits approved 5/24-->10/22/19    Authorization - Visit Number 6    Authorization - Number of Visits 12    Progress Note Due on Visit 10    PT Start Time 1313    PT Stop Time 1358    PT Time Calculation (min) 45 min    Activity Tolerance Patient tolerated treatment well;Patient limited by pain;No increased pain    Behavior During Therapy WFL for tasks assessed/performed           Past Medical History:  Diagnosis Date  . Abnormal liver function tests 05/03/2011  . BPH (benign prostatic hypertrophy) 05/03/2011  . Elevated transaminase level   . GERD (gastroesophageal reflux disease)   . Hairy cell leukemia   . Hairy cell leukemia    1992  . Hairy cell leukemia, in remission ([place]) 05/03/2011  . IBS (irritable bowel syndrome) 05/03/2011  . Irritable bowel syndrome    2 yrs ago, states he had inflammation around anus. Biopsy: inclusive    Past Surgical History:  Procedure Laterality Date  . BONE MARROW BIOPSY     2007  . COLONOSCOPY  08   with polyps  . COLONOSCOPY  09/30/2011   Procedure: COLONOSCOPY;  Surgeon: Rogene Houston, MD;  [facility];  Service: Endoscopy;  Laterality: N/A;  730  . COLONOSCOPY N/A 01/11/2019   Procedure: COLONOSCOPY;  Surgeon: Rogene Houston, MD;  [facility];  Service: Endoscopy;  Laterality: N/A;  100-office move to 9:30am  . HX COLON POLYPS    . TONSILLECTOMY     childhood     There were no vitals filed for this visit.   Subjective Assessment - 10/02/19 1318    Subjective Pt stated he feels like he is doing better, able to stand tall.  Continues to have radicular symptoms to posterior Rt knee, 6/10.  No reports of pain in back today.    Pertinent History hx back pain, scolosis possibly    Patient Stated Goals get back to normal and get back to working in the yard    Currently in Pain? Yes    Pain Score 6     Pain Location Knee    Pain Orientation Posterior    Pain Descriptors / Indicators Burning;Tingling    Pain Type Chronic pain    Pain Onset More than a month ago    Pain Frequency Intermittent    Aggravating Factors  pressure on Rt side, weight bearing    Pain Relieving Factors hot showers, cream, prone exercises    Effect of Pain on Daily Activities limits                             [facility] Adult PT Treatment/Exercise - 10/02/19 0001      Exercises   Exercises Lumbar      Lumbar Exercises: Stretches  Active Hamstring Stretch 2 reps;30 seconds    Active Hamstring Stretch Limitations supine with hands behind knees    Standing Extension 2 reps;5 reps    Standing Extension Limitations 3D hip excursion (weight shifting, rotation and standing extension)    Prone on Elbows Stretch 1 rep    Prone on Elbows Stretch Limitations 2 minutes    Figure 4 Stretch 2 reps;30 seconds      Lumbar Exercises: Supine   Bridge 5 reps    Bridge Limitations cueing to reduce lumbar curvature with task      Lumbar Exercises: Prone   Straight Leg Raise 10 reps    Straight Leg Raises Limitations 5" holds    Other Prone Lumbar Exercises heel squeeze 5x 5"      Manual Therapy   Manual Therapy Soft tissue mobilization;Joint mobilization;Neural Stretch    Manual therapy comments Manual complete separate than rest of tx    Soft tissue mobilization prone: lumbar paraspinal     Neural Stretch Supine: manual sciatic nerve glide                     PT Short Term Goals - 09/11/19 0858      PT SHORT TERM GOAL #1   Title Patient will be independent with HEP in order to improve functional outcomes.    Time 3    Period Weeks    Status On-going    Target Date 10/01/19      PT SHORT TERM GOAL #2   Title Patient will report at least 25% improvement in symptoms for improved quality of life.    Time 3    Period Weeks    Status On-going    Target Date 10/01/19             PT Long Term Goals - 09/11/19 0858      PT LONG TERM GOAL #1   Title Patient will report at least 25% improvement in symptoms for improved quality of life.    Time 6    Period Weeks    Status On-going      PT LONG TERM GOAL #2   Title Patient will improve FOTO score by at least 5 points in order to indicate improved tolerance to activity.    Time 6    Period Weeks    Status On-going      PT LONG TERM GOAL #3   Title Patient will be able to complete 5x STS in under 11.4 seconds in order to reduce the risk of falls.    Time 6    Period Weeks    Status On-going      PT LONG TERM GOAL #4   Title Patient will demonstrate at least 25% improvement in lumbar ROM in all planes for improved ability to move for yard work.    Time 6    Period Weeks    Status On-going                 Plan - 10/02/19 1607    Clinical Impression Statement Pt presents with improved posture initially this session.  Session focus on extension based exercises.  Added hamstring and piriformis stretches with reports of relief following hamstring stretch.  Added to HEP.  EOS pt reports he continues to have radicular symptoms, no reports of pain in back through session.    Personal Factors and Comorbidities Comorbidity 2;Past/Current Experience;Behavior Pattern    Comorbidities hx back pain, possible Scoliosis  Examination-Activity Limitations Bathing;Bed Mobility;Bend;Carry;Dressing;Lift;Locomotion Level;Sit;Sleep;Squat;Stairs;Stand;Transfers     Examination-Participation Restrictions Church;Cleaning;Community Activity;Driving;Meal Prep;Shop;Volunteer;Yard Work    [employer] officer Evolving/Moderate complexity    Rehab Potential Good    PT Frequency 2x / week    PT Duration 6 weeks    PT Treatment/Interventions ADLs/Self Care Home Management;Aquatic Therapy;Biofeedback;Canalith Repostioning;Cryotherapy;Electrical Stimulation;Iontophoresis 74m/ml Dexamethasone;Moist Heat;Traction;Ultrasound;DME Instruction;Gait training;Stair training;Functional mobility training;Therapeutic activities;Therapeutic exercise;Balance training;Neuromuscular re-education;Patient/family education;Manual techniques;Dry needling;Energy conservation;Splinting;Compression bandaging;Scar mobilization;Passive range of motion;Spinal Manipulations;Joint Manipulations    PT Next Visit Plan Extension based exercises, modify dosing if needed or discontinue if not improving symptoms. continue core and LE strengthing, continue manual therapy if needed    PT Home Exercise Plan 5/25 prone on elbows, prone press ups 6/10 hip extension, ab sets; 6/15: hamstring and piriformis stretch           Patient will benefit from skilled therapeutic intervention in order to improve the following deficits and impairments:  Abnormal gait, Decreased activity tolerance, Decreased balance, Decreased mobility, Decreased range of motion, Hypomobility, Decreased strength, Impaired sensation, Postural dysfunction, Improper body mechanics, Impaired flexibility, Pain  Visit Diagnosis: Other abnormalities of gait and mobility  Muscle weakness (generalized)  Other symptoms and signs involving the musculoskeletal system  Low back pain, unspecified back pain laterality, unspecified chronicity, unspecified whether sciatica present     Problem List Patient Active Problem List   Diagnosis Date Noted  . Hx of colonic polyps 10/26/2018  . Hairy cell leukemia, in [place] ([place]  05/03/2011  . IBS (irritable bowel syndrome) 05/03/2011  . BPH (benign prostatic hypertrophy) 05/03/2011  . Abnormal liver function tests 05/03/2011   CIhor Austin LPTA/CLT; CBIS [phone] CAldona Lento6/15/2021, 4:13 PM  [place] [address]Phone: [phone]  Fax:  [phone] Name: FYASH CACCIOLAMRN: [id number] of Birth: 711/24/1952

## 2019-10-02 NOTE — Patient Instructions (Signed)
Supine    Lie on back, legs bent and feet flat. Grasp behind one leg and slowly try to straighten knee. Hold 30 seconds.  Repeat 3 times per session. Do 2 sessions per day.  Copyright  VHI. All rights reserved.   Hip Extension    Lie on back, legs in air, knees bent. Grasp hands behind one thigh and cross other leg over same thigh. Hold 30 seconds. Repeat with other leg held. Repeat 3 times. Do 2 sessions per day.  Copyright  VHI. All rights reserved.

## 2019-10-04 ENCOUNTER — Ambulatory Visit (HOSPITAL_COMMUNITY): Payer: Medicare PPO | Admitting: Physical Therapy

## 2019-10-04 ENCOUNTER — Other Ambulatory Visit: Payer: Self-pay

## 2019-10-04 ENCOUNTER — Encounter (HOSPITAL_COMMUNITY): Payer: Self-pay | Admitting: Physical Therapy

## 2019-10-04 DIAGNOSIS — M545 Low back pain, unspecified: Secondary | ICD-10-CM

## 2019-10-04 DIAGNOSIS — R29898 Other symptoms and signs involving the musculoskeletal system: Secondary | ICD-10-CM

## 2019-10-04 DIAGNOSIS — M6281 Muscle weakness (generalized): Secondary | ICD-10-CM

## 2019-10-04 DIAGNOSIS — R2689 Other abnormalities of gait and mobility: Secondary | ICD-10-CM | POA: Diagnosis not present

## 2019-10-04 NOTE — Patient Instructions (Signed)
Access Code: [email or web address] URL: [email or web address] Date: 10/04/2019 Prepared by: Mitzi Hansen Monica Zahler  Exercises Supine Straight Leg Raises - 1 x daily - 7 x weekly - 2 sets - 10 reps

## 2019-10-04 NOTE — Therapy (Signed)
[place], [place], [address] Phone: [phone]   Fax:  [phone]  Physical Therapy Treatment  Patient Details  Name: Cameron Wu MRN: [id number] Date of Birth: 1950/12/22 Referring Provider (PT): [name] MD   Encounter Date: 10/04/2019   PT End of Session - 10/04/19 1440    Visit Number 7    Number of Visits 12    Date for PT Re-Evaluation 10/22/19    Authorization Type [employer] (auth via cohere, visits based on med necessity)    Authorization Time Period 12 visits approved 5/24-->10/22/19    Authorization - Visit Number 7    Authorization - Number of Visits 12    Progress Note Due on Visit 10    PT Start Time 1432    PT Stop Time 1512    PT Time Calculation (min) 40 min    Activity Tolerance Patient tolerated treatment well;Patient limited by pain;No increased pain    Behavior During Therapy WFL for tasks assessed/performed           Past Medical History:  Diagnosis Date  . Abnormal liver function tests 05/03/2011  . BPH (benign prostatic hypertrophy) 05/03/2011  . Elevated transaminase level   . GERD (gastroesophageal reflux disease)   . Hairy cell leukemia   . Hairy cell leukemia    1992  . Hairy cell leukemia, in remission ([place]) 05/03/2011  . IBS (irritable bowel syndrome) 05/03/2011  . Irritable bowel syndrome    2 yrs ago, states he had inflammation around anus. Biopsy: inclusive    Past Surgical History:  Procedure Laterality Date  . BONE MARROW BIOPSY     2007  . COLONOSCOPY  08   with polyps  . COLONOSCOPY  09/30/2011   Procedure: COLONOSCOPY;  Surgeon: Rogene Houston, MD;  [facility];  Service: Endoscopy;  Laterality: N/A;  730  . COLONOSCOPY N/A 01/11/2019   Procedure: COLONOSCOPY;  Surgeon: Rogene Houston, MD;  [facility];  Service: Endoscopy;  Laterality: N/A;  100-office move to 9:30am  . HX COLON POLYPS    . TONSILLECTOMY     childhood     There were no vitals filed for this visit.   Subjective Assessment - 10/04/19 1431    Subjective Patient states his back is fine now. He is still having knee pain and ankle symptoms. He continues to walk with a hunch and used to be straight up. First thing in the morning symptoms are worse and then at around 3pm it tightens up again. He has not had back pain since Monday with lifting.    Pertinent History hx back pain, scolosis possibly    Patient Stated Goals get back to normal and get back to working in the yard    Currently in Pain? Yes    Pain Score 7     Pain Location Knee    Pain Onset More than a month ago              [facility] PT Assessment - 10/04/19 0001      Assessment   Medical Diagnosis R Lumbar Racidulopathy    Referring Provider (PT) [name] MD    Onset Date/Surgical Date 08/20/19    Next MD Visit No follow up scheduled    Prior Therapy None      Precautions   Precautions None      Restrictions   Weight Bearing Restrictions No  Balance Screen   Has the patient fallen in the past 6 months No    Has the patient had a decrease in activity level because of a fear of falling?  Yes    Is the patient reluctant to leave their home because of a fear of falling?  Yes      Prior Function   Level of Independence Independent      Cognition   Overall Cognitive Status Within Functional Limits for tasks assessed      Observation/Other Assessments   Observations Ambulates without AD, limited R knee flexion extension ROM during gait, seated with L lateral lean    Focus on Therapeutic Outcomes (FOTO)  65% limited      Sensation   Light Touch Impaired Detail    Light Touch Impaired Details Impaired RLE      Palpation   Spinal mobility grossly hypomobile, pain lumbar PA glides locally, hypomobile thoracic CPA and right and Left UPA, Painful R and L upa in lumbar spine L1-L5    Palpation comment TTP distal hamstrings                          [facility] Adult PT Treatment/Exercise - 10/04/19 0001      Lumbar Exercises: Stretches   Active Hamstring Stretch 5 reps;30 seconds    Active Hamstring Stretch Limitations supine with hands behind knees      Lumbar Exercises: Supine   Straight Leg Raise 10 reps      Manual Therapy   Manual Therapy Soft tissue mobilization    Manual therapy comments Manual complete separate than rest of tx    Soft tissue mobilization prone: distal hamstrings                  PT Education - 10/04/19 1439    Education Details Patient educated on HEP, mechanics of exercise, gradually increasing activity    Person(s) Educated Patient    Methods Explanation;Demonstration    Comprehension Verbalized understanding;Returned demonstration            PT Short Term Goals - 09/11/19 0858      PT SHORT TERM GOAL #1   Title Patient will be independent with HEP in order to improve functional outcomes.    Time 3    Period Weeks    Status On-going    Target Date 10/01/19      PT SHORT TERM GOAL #2   Title Patient will report at least 25% improvement in symptoms for improved quality of life.    Time 3    Period Weeks    Status On-going    Target Date 10/01/19             PT Long Term Goals - 09/11/19 0858      PT LONG TERM GOAL #1   Title Patient will report at least 25% improvement in symptoms for improved quality of life.    Time 6    Period Weeks    Status On-going      PT LONG TERM GOAL #2   Title Patient will improve FOTO score by at least 5 points in order to indicate improved tolerance to activity.    Time 6    Period Weeks    Status On-going      PT LONG TERM GOAL #3   Title Patient will be able to complete 5x STS in under 11.4 seconds in order to reduce the risk of  falls.    Time 6    Period Weeks    Status On-going      PT LONG TERM GOAL #4   Title Patient will demonstrate at least 25% improvement in lumbar ROM in all planes for improved ability to move for yard work.     Time 6    Period Weeks    Status On-going                 Plan - 10/04/19 1444    Clinical Impression Statement Patient continues to have c/o radicular pain into RLE with burning in posterior thigh and numbness in ankle. Concordant pain is brought on by palpation of distal hamstrings/near sciatic nerve in posterior thigh. He experiences minimal change in symptoms following STM and slight change in posterior leg symptoms following hamstring stretch and SLR. Patient will continue to benefit from skilled physical therapy in order to improve function and reduce impairment.    Personal Factors and Comorbidities Comorbidity 2;Past/Current Experience;Behavior Pattern    Comorbidities hx back pain, possible Scoliosis    Examination-Activity Limitations Bathing;Bed Mobility;Bend;Carry;Dressing;Lift;Locomotion Level;Sit;Sleep;Squat;Stairs;Stand;Transfers    Examination-Participation Restrictions Church;Cleaning;Community Activity;Driving;Meal Prep;Shop;Volunteer;Yard Work    [employer] officer Evolving/Moderate complexity    Rehab Potential Good    PT Frequency 2x / week    PT Duration 6 weeks    PT Treatment/Interventions ADLs/Self Care Home Management;Aquatic Therapy;Biofeedback;Canalith Repostioning;Cryotherapy;Electrical Stimulation;Iontophoresis 71m/ml Dexamethasone;Moist Heat;Traction;Ultrasound;DME Instruction;Gait training;Stair training;Functional mobility training;Therapeutic activities;Therapeutic exercise;Balance training;Neuromuscular re-education;Patient/family education;Manual techniques;Dry needling;Energy conservation;Splinting;Compression bandaging;Scar mobilization;Passive range of motion;Spinal Manipulations;Joint Manipulations    PT Next Visit Plan Extension based exercises, modify dosing if needed or discontinue if not improving symptoms. continue core and LE strengthing, continue manual therapy if needed    PT Home Exercise Plan 5/25 prone on elbows, prone press  ups 6/10 hip extension, ab sets; 6/15: hamstring and piriformis stretch           Patient will benefit from skilled therapeutic intervention in order to improve the following deficits and impairments:  Abnormal gait, Decreased activity tolerance, Decreased balance, Decreased mobility, Decreased range of motion, Hypomobility, Decreased strength, Impaired sensation, Postural dysfunction, Improper body mechanics, Impaired flexibility, Pain  Visit Diagnosis: Other abnormalities of gait and mobility  Muscle weakness (generalized)  Other symptoms and signs involving the musculoskeletal system  Low back pain, unspecified back pain laterality, unspecified chronicity, unspecified whether sciatica present     Problem List Patient Active Problem List   Diagnosis Date Noted  . Hx of colonic polyps 10/26/2018  . H[place] cell [place], in [place] ([place] 05/03/2011  . IBS (irritable bowel syndrome) 05/03/2011  . BPH (benign prostatic hypertrophy) 05/03/2011  . Abnormal liver function tests 05/03/2011   3:18 PM, 10/04/19 AMearl LatinPT, DPT Physical Therapist [facility] [address]Phone: [phone]  Fax:  [phone] Name: Cameron SLUKAMRN: [id number] of Birth: [birth year]

## 2019-10-09 ENCOUNTER — Other Ambulatory Visit: Payer: Self-pay

## 2019-10-09 ENCOUNTER — Encounter (HOSPITAL_COMMUNITY): Payer: Self-pay | Admitting: Physical Therapy

## 2019-10-09 ENCOUNTER — Ambulatory Visit (HOSPITAL_COMMUNITY): Payer: Medicare PPO | Admitting: Physical Therapy

## 2019-10-09 DIAGNOSIS — R29898 Other symptoms and signs involving the musculoskeletal system: Secondary | ICD-10-CM | POA: Diagnosis not present

## 2019-10-09 DIAGNOSIS — M6281 Muscle weakness (generalized): Secondary | ICD-10-CM | POA: Diagnosis not present

## 2019-10-09 DIAGNOSIS — R2689 Other abnormalities of gait and mobility: Secondary | ICD-10-CM | POA: Diagnosis not present

## 2019-10-09 DIAGNOSIS — M545 Low back pain, unspecified: Secondary | ICD-10-CM

## 2019-10-09 NOTE — Therapy (Signed)
[place], [place], [address] Phone: [phone]   Fax:  [phone]  Physical Therapy Treatment  Patient Details  Name: Cameron Wu MRN: [id number] Date of Birth: 04-Sep-1950 Referring Provider (PT): [name] MD   Encounter Date: 10/09/2019   PT End of Session - 10/09/19 1314    Visit Number 8    Number of Visits 12    Date for PT Re-Evaluation 10/22/19    Authorization Type [employer] (auth via cohere, visits based on med necessity)    Authorization Time Period 12 visits approved 5/24-->10/22/19    Authorization - Visit Number 8    Authorization - Number of Visits 12    Progress Note Due on Visit 10    PT Start Time 1315    PT Stop Time 1403    PT Time Calculation (min) 48 min    Activity Tolerance Patient tolerated treatment well;Patient limited by pain;No increased pain    Behavior During Therapy WFL for tasks assessed/performed           Past Medical History:  Diagnosis Date  . Abnormal liver function tests 05/03/2011  . BPH (benign prostatic hypertrophy) 05/03/2011  . Elevated transaminase level   . GERD (gastroesophageal reflux disease)   . Hairy cell leukemia   . Hairy cell leukemia    1992  . Hairy cell leukemia, in remission ([place]) 05/03/2011  . IBS (irritable bowel syndrome) 05/03/2011  . Irritable bowel syndrome    2 yrs ago, states he had inflammation around anus. Biopsy: inclusive    Past Surgical History:  Procedure Laterality Date  . BONE MARROW BIOPSY     2007  . COLONOSCOPY  08   with polyps  . COLONOSCOPY  09/30/2011   Procedure: COLONOSCOPY;  Surgeon: Rogene Houston, MD;  [facility];  Service: Endoscopy;  Laterality: N/A;  730  . COLONOSCOPY N/A 01/11/2019   Procedure: COLONOSCOPY;  Surgeon: Rogene Houston, MD;  [facility];  Service: Endoscopy;  Laterality: N/A;  100-office move to 9:30am  . HX COLON POLYPS    . TONSILLECTOMY     childhood     There were no vitals filed for this visit.   Subjective Assessment - 10/09/19 1448    Subjective States his back pain is still good but his leg pain is still present and is currently 6/10. Over the weekend it was about 4/10. Stretching the leg supine with hip and leg elevated help with this. States that the pain starts going down to the ankle bone and then up to the top of the foot and into the pinky toe.    Pertinent History hx back pain, scolosis possibly    Patient Stated Goals get back to normal and get back to working in the yard    Currently in Pain? Yes    Pain Score 6     Pain Location Knee    Pain Orientation Posterior;Lateral;Right    Pain Descriptors / Indicators Tingling;Aching;Burning    Pain Type Chronic pain    Pain Onset More than a month ago              [facility] PT Assessment - 10/09/19 0001      Assessment   Medical Diagnosis R Lumbar Racidulopathy    Referring Provider (PT) [name] MD    Onset Date/Surgical Date 08/20/19    Next MD Visit No follow up scheduled  Observation/Other Assessments   Observations closed kinetic chain and passive laying- increases pain - when PT supports leg (lifts leg up off table no matter the postion of knee - pain reduces)      Observation/Other Assessments-Edema    Edema Circumferential      Circumferential Edema   Circumferential - Right knee at joint line R 37.0 cm     Circumferential - Left  knee at joint line left 33.5cm       Flexibility   Soft Tissue Assessment /Muscle Length yes    Quadriceps limited on R - pain in front of hip with prone knee flexion - - knee gets to 95 degrees in prone      Palpation   Spinal mobility hypomobility noted in thoracic and lumbar spine with medial glides and Pas - referred symptoms/pain with R medial glides of L1-3    Palpation comment Tenderness over R psoas, fibular head, distal hamstring. Tenderness/pain and referred symptoms into right knee with palpation of R quadratus  lumborum                          [facility] Adult PT Treatment/Exercise - 10/09/19 0001      Lumbar Exercises: Standing   Other Standing Lumbar Exercises TRA activation in standing - no change in symptoms - glute activation in standing - no change in symptoms    Other Standing Lumbar Exercises R hip dips at wall - standing along increased pain       Lumbar Exercises: Seated   Other Seated Lumbar Exercises TRA activation in seated - "sitting tall" - pain reduced in R LE - 3 minutes      Manual Therapy   Manual Therapy Joint mobilization;Soft tissue mobilization    Manual therapy comments Manual complete separate than rest of tx    Joint Mobilization Medial glides to R lx spine grade II    Soft tissue mobilization STM/TPR R QL and obliques                  PT Education - 10/09/19 1455    Education Details educated patient in current findings, rationale of interventions and cause of continued pain. Discussed and educated patient to schedule with MD.    Terence Lux) Educated Patient    Methods Explanation    Comprehension Verbalized understanding            PT Short Term Goals - 09/11/19 0858      PT SHORT TERM GOAL #1   Title Patient will be independent with HEP in order to improve functional outcomes.    Time 3    Period Weeks    Status On-going    Target Date 10/01/19      PT SHORT TERM GOAL #2   Title Patient will report at least 25% improvement in symptoms for improved quality of life.    Time 3    Period Weeks    Status On-going    Target Date 10/01/19             PT Long Term Goals - 09/11/19 0858      PT LONG TERM GOAL #1   Title Patient will report at least 25% improvement in symptoms for improved quality of life.    Time 6    Period Weeks    Status On-going      PT LONG TERM GOAL #2   Title Patient will improve FOTO score by at least  5 points in order to indicate improved tolerance to activity.    Time 6    Period Weeks    Status  On-going      PT LONG TERM GOAL #3   Title Patient will be able to complete 5x STS in under 11.4 seconds in order to reduce the risk of falls.    Time 6    Period Weeks    Status On-going      PT LONG TERM GOAL #4   Title Patient will demonstrate at least 25% improvement in lumbar ROM in all planes for improved ability to move for yard work.    Time 6    Period Weeks    Status On-going                 Plan - 10/09/19 1315    Clinical Impression Statement Patient continues to present with right radicular symptoms that reduce when patient is laying down or when leg is lifted off table in supine position. As soon as patient returns to position where he is up against gravity, pain returns. Symptoms initially abolished with STM to R quadratus lumborum but once patient sat up symptoms returned. Able to reduce these symptoms with TRA activation with focus on tall posture while in seated. Symptoms present as if muscles are not properly activating  and supporting patient while patient is up against gravity and consequently trunk collapses towards the right causing increased pressure on the R lower extremity. To note there is swelling around right knee joint, but this is likely due to excessive load placed on the knee while in weightbearing.  Discussed patient following up with MD secondary to patient concern and frustration with continued pain. Educated patient on findings, plan moving forward and things to practice at home.    Personal Factors and Comorbidities Comorbidity 2;Past/Current Experience;Behavior Pattern    Comorbidities hx back pain, possible Scoliosis    Examination-Activity Limitations Bathing;Bed Mobility;Bend;Carry;Dressing;Lift;Locomotion Level;Sit;Sleep;Squat;Stairs;Stand;Transfers    Examination-Participation Restrictions Church;Cleaning;Community Activity;Driving;Meal Prep;Shop;Volunteer;Yard Work    [employer] Evolving/Moderate complexity    Rehab  Potential Good    PT Frequency 2x / week    PT Duration 6 weeks    PT Treatment/Interventions ADLs/Self Care Home Management;Aquatic Therapy;Biofeedback;Canalith Repostioning;Cryotherapy;Electrical Stimulation;Iontophoresis 80m/ml Dexamethasone;Moist Heat;Traction;Ultrasound;DME Instruction;Gait training;Stair training;Functional mobility training;Therapeutic activities;Therapeutic exercise;Balance training;Neuromuscular re-education;Patient/family education;Manual techniques;Dry needling;Energy conservation;Splinting;Compression bandaging;Scar mobilization;Passive range of motion;Spinal Manipulations;Joint Manipulations    PT Next Visit Plan Tall posture with TRA activation seated and working towards standing, muscle activation against gravity (hip and core). STM to R QL as needed, lengthening and strengthening of QL. Extension based exercises if patient presents with low back pain.    PT Home Exercise Plan 5/25 prone on elbows, prone press ups 6/10 hip extension, ab sets; 6/15: hamstring and piriformis stretch    Consulted and Agree with Plan of Care Patient           Patient will benefit from skilled therapeutic intervention in order to improve the following deficits and impairments:  Abnormal gait, Decreased activity tolerance, Decreased balance, Decreased mobility, Decreased range of motion, Hypomobility, Decreased strength, Impaired sensation, Postural dysfunction, Improper body mechanics, Impaired flexibility, Pain  Visit Diagnosis: Other abnormalities of gait and mobility  Muscle weakness (generalized)  Other symptoms and signs involving the musculoskeletal system  Low back pain, unspecified back pain laterality, unspecified chronicity, unspecified whether sciatica present     Problem List Patient Active Problem List   Diagnosis Date Noted  . Hx of colonic polyps  10/26/2018  . Hairy cell leukemia, in remission ([place]) 05/03/2011  . IBS (irritable bowel syndrome) 05/03/2011  .  BPH (benign prostatic hypertrophy) 05/03/2011  . Abnormal liver function tests 05/03/2011   3:12 PM, 10/09/19 Jerene Pitch, DPT Physical Therapy with [facility]  [phone] office  [place] [address] [place], [place], [address] Phone: [phone]   Fax:  [phone]  Name: KORDE JEPPSEN MRN: [id number] Date of Birth: [birth year]

## 2019-10-11 ENCOUNTER — Encounter (HOSPITAL_COMMUNITY): Payer: Self-pay | Admitting: Physical Therapy

## 2019-10-11 ENCOUNTER — Ambulatory Visit (HOSPITAL_COMMUNITY): Payer: Medicare PPO | Admitting: Physical Therapy

## 2019-10-11 ENCOUNTER — Other Ambulatory Visit: Payer: Self-pay

## 2019-10-11 DIAGNOSIS — M6281 Muscle weakness (generalized): Secondary | ICD-10-CM

## 2019-10-11 DIAGNOSIS — R29898 Other symptoms and signs involving the musculoskeletal system: Secondary | ICD-10-CM

## 2019-10-11 DIAGNOSIS — M545 Low back pain, unspecified: Secondary | ICD-10-CM

## 2019-10-11 DIAGNOSIS — R2689 Other abnormalities of gait and mobility: Secondary | ICD-10-CM

## 2019-10-11 NOTE — Therapy (Signed)
[place], [place], [address] Phone: [phone]   Fax:  [phone]  Physical Therapy Treatment  Patient Details  Name: Cameron Wu MRN: [id number] Date of Birth: [birth year] Referring Provider (PT): [name] MD   Encounter Date: 10/11/2019   PT End of Session - 10/11/19 1352    Visit Number 9    Number of Visits 12    Date for PT Re-Evaluation 10/22/19    Authorization Type [employer] (auth via cohere, visits based on med necessity)    Authorization Time Period 12 visits approved 5/24-->10/22/19    Authorization - Visit Number 9    Authorization - Number of Visits 12    Progress Note Due on Visit 10    PT Start Time 2671    PT Stop Time 1445    PT Time Calculation (min) 56 min    Activity Tolerance Patient tolerated treatment well;Patient limited by pain;No increased pain    Behavior During Therapy [facility] for tasks assessed/performed           Past Medical History:  Diagnosis Date   Abnormal liver function tests 05/03/2011   BPH (benign prostatic hypertrophy) 05/03/2011   Elevated transaminase level    GERD (gastroesophageal reflux disease)    Hairy cell leukemia    Hairy cell leukemia    1992   Hairy cell leukemia, in remission ([place]) 05/03/2011   IBS (irritable bowel syndrome) 05/03/2011   Irritable bowel syndrome    2 yrs ago, states he had inflammation around anus. Biopsy: inclusive    Past Surgical History:  Procedure Laterality Date   BONE MARROW BIOPSY     2007   COLONOSCOPY  08   with polyps   COLONOSCOPY  09/30/2011   Procedure: COLONOSCOPY;  Surgeon: Rogene Houston, MD;  [facility];  Service: Endoscopy;  Laterality: N/A;  730   COLONOSCOPY N/A 01/11/2019   Procedure: COLONOSCOPY;  Surgeon: Rogene Houston, MD;  [facility];  Service: Endoscopy;  Laterality: N/A;  100-office move to 9:30am   HX COLON POLYPS     TONSILLECTOMY     childhood     There were no vitals filed for this visit.   Subjective Assessment - 10/11/19 1348    Subjective Patient states his back is fine but down to the ankle hurts. Back of knee is burning pain, ankle is tingly. Has been doing posture and breathing exercises at home and symptoms decrease while performing but increase after getting up.    Pertinent History hx back pain, scolosis possibly    Patient Stated Goals get back to normal and get back to working in the yard    Currently in Pain? Yes    Pain Score 6     Pain Location Knee    Pain Onset More than a month ago                             [facility] Adult PT Treatment/Exercise - 10/11/19 0001      Lumbar Exercises: Seated   Other Seated Lumbar Exercises TRA activation in seated - "sitting tall" - 5 minutes, 10 minutes with R QL stretch with seated reaches with TRA activation, sets of 10 and sustained holds      Manual Therapy   Manual Therapy Joint mobilization;Soft tissue mobilization;Manual Traction;Muscle Energy Technique    Manual therapy comments Manual complete separate than  rest of tx    Joint Mobilization Medial glides to R lx spine grade II    Soft tissue mobilization STM/TPR R QL and obliques    Manual Traction with LE on ball 6 minutes    Muscle Energy Technique contract relax in sidelying R QL                  PT Education - 10/11/19 1359    Education Details Patient educated on HEP, mechanics of exercise, why techniques were being used, posture and discussed POC    Person(s) Educated Patient    Methods Explanation;Demonstration    Comprehension Verbalized understanding;Returned demonstration            PT Short Term Goals - 09/11/19 0858      PT SHORT TERM GOAL #1   Title Patient will be independent with HEP in order to improve functional outcomes.    Time 3    Period Weeks    Status On-going    Target Date 10/01/19      PT SHORT TERM GOAL #2   Title Patient will report at least 25%  improvement in symptoms for improved quality of life.    Time 3    Period Weeks    Status On-going    Target Date 10/01/19             PT Long Term Goals - 09/11/19 0858      PT LONG TERM GOAL #1   Title Patient will report at least 25% improvement in symptoms for improved quality of life.    Time 6    Period Weeks    Status On-going      PT LONG TERM GOAL #2   Title Patient will improve FOTO score by at least 5 points in order to indicate improved tolerance to activity.    Time 6    Period Weeks    Status On-going      PT LONG TERM GOAL #3   Title Patient will be able to complete 5x STS in under 11.4 seconds in order to reduce the risk of falls.    Time 6    Period Weeks    Status On-going      PT LONG TERM GOAL #4   Title Patient will demonstrate at least 25% improvement in lumbar ROM in all planes for improved ability to move for yard work.    Time 6    Period Weeks    Status On-going                 Plan - 10/11/19 1352    Clinical Impression Statement Patient experiences no change in symptoms following sitting tall with TRA activation and breathing today and requires some verbal cueing for posture. He requires cueing for posture throughout session and tends to sit slouched. Patient has no change in symptoms with TRA activation and L side bending in seated for R QL lengthening. Patient experiences reduction in LE symptoms with joint mobilizations and STM which improves for short while but returns upon sitting. Attempted TRA activation and side bending again with report of RLE symptoms increasing. Patient tolerates manual traction well with decrease in symptoms following but upon sitting reduction in symptoms is short and they return gradually. Attempted contract relax in side lying for QL lengthening and relaxion with reduction in symptoms even upon returning to sitting with gradual increase after several minutes. Patient will continue to benefit from skilled  physical therapy in order  to reduce impairment and improve function.    Personal Factors and Comorbidities Comorbidity 2;Past/Current Experience;Behavior Pattern    Comorbidities hx back pain, possible Scoliosis    Examination-Activity Limitations Bathing;Bed Mobility;Bend;Carry;Dressing;Lift;Locomotion Level;Sit;Sleep;Squat;Stairs;Stand;Transfers    Examination-Participation Restrictions Church;Cleaning;Community Activity;Driving;Meal Prep;Shop;Volunteer;Yard Work    [employer] officer Evolving/Moderate complexity    Rehab Potential Good    PT Frequency 2x / week    PT Duration 6 weeks    PT Treatment/Interventions ADLs/Self Care Home Management;Aquatic Therapy;Biofeedback;Canalith Repostioning;Cryotherapy;Electrical Stimulation;Iontophoresis 49m/ml Dexamethasone;Moist Heat;Traction;Ultrasound;DME Instruction;Gait training;Stair training;Functional mobility training;Therapeutic activities;Therapeutic exercise;Balance training;Neuromuscular re-education;Patient/family education;Manual techniques;Dry needling;Energy conservation;Splinting;Compression bandaging;Scar mobilization;Passive range of motion;Spinal Manipulations;Joint Manipulations    PT Next Visit Plan PN next session; possibly continue with contract relax for QL lengthening. Tall posture with TRA activation seated and working towards standing, muscle activation against gravity (hip and core). STM to R QL as needed, lengthening and strengthening of QL. Extension based exercises if patient presents with low back pain.    PT Home Exercise Plan 5/25 prone on elbows, prone press ups 6/10 hip extension, ab sets; 6/15: hamstring and piriformis stretch    Consulted and Agree with Plan of Care Patient           Patient will benefit from skilled therapeutic intervention in order to improve the following deficits and impairments:  Abnormal gait, Decreased activity tolerance, Decreased balance, Decreased mobility, Decreased range of  motion, Hypomobility, Decreased strength, Impaired sensation, Postural dysfunction, Improper body mechanics, Impaired flexibility, Pain  Visit Diagnosis: Other abnormalities of gait and mobility  Muscle weakness (generalized)  Other symptoms and signs involving the musculoskeletal system  Low back pain, unspecified back pain laterality, unspecified chronicity, unspecified whether sciatica present     Problem List Patient Active Problem List   Diagnosis Date Noted   Hx of colonic polyps 10/26/2018   [place] 05/03/2011   IBS (irritable bowel syndrome) 05/03/2011   BPH (benign prostatic hypertrophy) 05/03/2011   Abnormal liver function tests 05/03/2011    3:05 PM, 10/11/19 AMearl LatinPT, DPT [facility]rap[place] [facility] [id number] [address]Phone: [phone]  Fax:  [phone] Name: FLC JOYNTMRN: [id number] of Birth: 7Dec 10, 1952

## 2019-10-15 ENCOUNTER — Encounter (HOSPITAL_COMMUNITY): Payer: Self-pay | Admitting: Physical Therapy

## 2019-10-15 ENCOUNTER — Ambulatory Visit (HOSPITAL_COMMUNITY): Payer: Medicare PPO | Admitting: Physical Therapy

## 2019-10-15 ENCOUNTER — Other Ambulatory Visit: Payer: Self-pay

## 2019-10-15 DIAGNOSIS — M545 Low back pain, unspecified: Secondary | ICD-10-CM

## 2019-10-15 DIAGNOSIS — R2689 Other abnormalities of gait and mobility: Secondary | ICD-10-CM | POA: Diagnosis not present

## 2019-10-15 DIAGNOSIS — R29898 Other symptoms and signs involving the musculoskeletal system: Secondary | ICD-10-CM | POA: Diagnosis not present

## 2019-10-15 DIAGNOSIS — M6281 Muscle weakness (generalized): Secondary | ICD-10-CM | POA: Diagnosis not present

## 2019-10-15 NOTE — Therapy (Signed)
[facility] [address] [place], [place], [address] Phone: [phone]   Fax:  [phone]  Physical Therapy Treatment/Progress Note/ Recert  Patient Details  Name: Cameron Wu MRN: [id number] Date of Birth: February 09, 1951 Referring Provider (PT): [name] MD    Encounter Date: 10/15/2019   Progress Note   Reporting Period 09/10/19 to 10/15/19   See note below for Objective Data and Assessment of Progress/Goals    PT End of Session - 10/15/19 1433    Visit Number 10    Number of Visits 20    Date for PT Re-Evaluation 11/12/19    Authorization Type [employer] (auth via cohere, visits based on med necessity)    Authorization Time Period 12 visits approved 5/24-->10/22/19; requesting 8 visits 6/28-7/26/21    Authorization - Visit Number 0    Authorization - Number of Visits 8    Progress Note Due on Visit 20    PT Start Time 9458    PT Stop Time 1430    PT Time Calculation (min) 43 min    Activity Tolerance Patient tolerated treatment well;Patient limited by pain;No increased pain    Behavior During Therapy WFL for tasks assessed/performed           Past Medical History:  Diagnosis Date  . Abnormal liver function tests 05/03/2011  . BPH (benign prostatic hypertrophy) 05/03/2011  . Elevated transaminase level   . GERD (gastroesophageal reflux disease)   . Hairy cell leukemia   . Hairy cell leukemia    1992  . Hairy cell leukemia, in remission ([place]) 05/03/2011  . IBS (irritable bowel syndrome) 05/03/2011  . Irritable bowel syndrome    2 yrs ago, states he had inflammation around anus. Biopsy: inclusive    Past Surgical History:  Procedure Laterality Date  . BONE MARROW BIOPSY     2007  . COLONOSCOPY  08   with polyps  . COLONOSCOPY  09/30/2011   Procedure: COLONOSCOPY;  Surgeon: Rogene Houston, MD;  [facility];  Service: Endoscopy;  Laterality: N/A;  730  . COLONOSCOPY N/A 01/11/2019   Procedure:  COLONOSCOPY;  Surgeon: Rogene Houston, MD;  [facility];  Service: Endoscopy;  Laterality: N/A;  100-office move to 9:30am  . HX COLON POLYPS    . TONSILLECTOMY     childhood    There were no vitals filed for this visit.   Subjective Assessment - 10/15/19 1345    Subjective Patient states that Friday morning he woke up and had no pain or LE symptoms until after lunch. He thinks that what we did last session was helpful. His home exercises are going well. Patient states 50% improvement with physical therapy intervention.    Pertinent History hx back pain, scolosis possibly    Patient Stated Goals get back to normal and get back to working in the yard    Currently in Pain? Yes    Pain Score 6     Pain Location Knee    Pain Onset More than a month ago              [facility] PT Assessment - 10/15/19 0001      Assessment   Medical Diagnosis R Lumbar Racidulopathy    Referring Provider (PT) [name] MD    Onset Date/Surgical Date 08/20/19    Next MD Visit No follow up scheduled    Prior Therapy None      Precautions  Precautions None      Restrictions   Weight Bearing Restrictions No      Balance Screen   Has the patient fallen in the past 6 months No    Has the patient had a decrease in activity level because of a fear of falling?  Yes    Is the patient reluctant to leave their home because of a fear of falling?  Yes      Prior Function   Level of Independence Independent      Cognition   Overall Cognitive Status Within Functional Limits for tasks assessed      Observation/Other Assessments   Observations Ambulates without AD, limited R knee flexion extension ROM during gait    Focus on Therapeutic Outcomes (FOTO)  53% limited      Sensation   Light Touch Impaired Detail    Light Touch Impaired Details Impaired RLE      AROM   Lumbar Flexion 25 % limited    Lumbar Extension 75% limited    Lumbar - Right Side Bend 50% limited     Lumbar - Left Side  Bend 0% limited     Lumbar - Right Rotation 0% limited    Lumbar - Left Rotation 0 % limited      Strength   Right Hip Flexion 4+/5    Left Hip Flexion 4/5    Right Knee Flexion 5/5    Right Knee Extension 5/5    Left Knee Flexion 5/5    Left Knee Extension 5/5    Right Ankle Dorsiflexion 5/5    Left Ankle Dorsiflexion 5/5                         [facility] Adult PT Treatment/Exercise - 10/15/19 0001      Lumbar Exercises: Seated   Other Seated Lumbar Exercises TRA activation on ball  with pelvic tilts AP and lateral  30 x each direction with manual and verbal cueing    Other Seated Lumbar Exercises TRA activation in seated - "sitting tall" - 5 minutes, 10 minutes with R QL stretch with seated reaches with TRA activation, sets of 10 and sustained holds      Manual Therapy   Manual Therapy Muscle Energy Technique    Manual therapy comments Manual complete separate than rest of tx    Muscle Energy Technique contract relax in sidelying R QL                  PT Education - 10/15/19 1346    Education Details Patient educated on HEP, mechanics of exercise, extending POC    Person(s) Educated Patient    Methods Explanation;Demonstration    Comprehension Verbalized understanding;Returned demonstration            PT Short Term Goals - 10/15/19 1351      PT SHORT TERM GOAL #1   Title Patient will be independent with HEP in order to improve functional outcomes.    Time 3    Period Weeks    Status Achieved    Target Date 10/01/19      PT SHORT TERM GOAL #2   Title Patient will report at least 25% improvement in symptoms for improved quality of life.    Time 3    Period Weeks    Status Achieved    Target Date 10/01/19             PT Long Term Goals -  10/15/19 1354      PT LONG TERM GOAL #1   Title Patient will report at least 75% improvement in symptoms for improved quality of life.    Time 6    Period Weeks    Status On-going      PT LONG TERM  GOAL #2   Title Patient will improve FOTO score by at least 5 points in order to indicate improved tolerance to activity.    Time 6    Period Weeks    Status Achieved      PT LONG TERM GOAL #3   Title Patient will be able to complete 5x STS in under 11.4 seconds in order to reduce the risk of falls.    Time 6    Period Weeks    Status On-going      PT LONG TERM GOAL #4   Title Patient will demonstrate at least 25% improvement in lumbar ROM in all planes for improved ability to move for yard work.    Time 6    Period Weeks    Status On-going                 Plan - 10/15/19 1346    Clinical Impression Statement Patient has met 2/2 short term goals with improving symptoms and ability to complete HEP. He has met 1/4 long term goals with improved activity tolerance as indicated by foto. Patient demonstrating similar LE strength and improving ROM since last reassessment. Patient appears to be making slow progress.  Patient's low back pain has decreased significantly but continues to have decreased ROM and RLE radicular symptoms. Patient experiences reduction in symptoms with manual therapy today and symptoms return after several minutes in seated/standing. Patient demonstrates impaired motor control with pelvic tilts on ball and requires verbal, tactile, and manual cueing to complete. Patient symptoms return. Completed another round of contract relax at end of session with limited decrease in symptoms. Patient will continue to benefit from skilled physical therapy in order to reduce impairment and improve function.    Personal Factors and Comorbidities Comorbidity 2;Past/Current Experience;Behavior Pattern    Comorbidities hx back pain, possible Scoliosis    Examination-Activity Limitations Bathing;Bed Mobility;Bend;Carry;Dressing;Lift;Locomotion Level;Sit;Sleep;Squat;Stairs;Stand;Transfers    Examination-Participation Restrictions Church;Cleaning;Community Activity;Driving;Meal  Prep;Shop;Volunteer;Yard Work    [employer] officer Evolving/Moderate complexity    Rehab Potential Good    PT Frequency 2x / week    PT Duration 4 weeks    PT Treatment/Interventions ADLs/Self Care Home Management;Aquatic Therapy;Biofeedback;Canalith Repostioning;Cryotherapy;Electrical Stimulation;Iontophoresis 83m/ml Dexamethasone;Moist Heat;Traction;Ultrasound;DME Instruction;Gait training;Stair training;Functional mobility training;Therapeutic activities;Therapeutic exercise;Balance training;Neuromuscular re-education;Patient/family education;Manual techniques;Dry needling;Energy conservation;Splinting;Compression bandaging;Scar mobilization;Passive range of motion;Spinal Manipulations;Joint Manipulations    PT Next Visit Plan possibly continue with contract relax for QL lengthening. Tall posture with TRA activation seated and working towards standing, muscle activation against gravity (hip and core). STM to R QL as needed, lengthening and strengthening of QL. Extension based exercises if patient presents with low back pain.    PT Home Exercise Plan 5/25 prone on elbows, prone press ups 6/10 hip extension, ab sets; 6/15: hamstring and piriformis stretch    Consulted and Agree with Plan of Care Patient           Patient will benefit from skilled therapeutic intervention in order to improve the following deficits and impairments:  Abnormal gait, Decreased activity tolerance, Decreased balance, Decreased mobility, Decreased range of motion, Hypomobility, Decreased strength, Impaired sensation, Postural dysfunction, Improper body mechanics, Impaired flexibility, Pain  Visit Diagnosis: Other abnormalities of  gait and mobility  Muscle weakness (generalized)  Other symptoms and signs involving the musculoskeletal system  Low back pain, unspecified back pain laterality, unspecified chronicity, unspecified whether sciatica present     Problem List Patient Active Problem List    Diagnosis Date Noted  . Hx of colonic polyps 10/26/2018  . Hairy cell leukemia, in remission ([place]) 05/03/2011  . IBS (irritable bowel syndrome) 05/03/2011  . BPH (benign prostatic hypertrophy) 05/03/2011  . Abnormal liver function tests 05/03/2011    3:59 PM, 10/15/19 Mearl Latin PT, DPT Physical Therapist at [facility] [place], [place], [address] Phone: [phone]   Fax:  [phone]  Name: Cameron Wu MRN: [id number] Date of Birth: 12/31/1950

## 2019-10-17 ENCOUNTER — Other Ambulatory Visit: Payer: Self-pay

## 2019-10-17 ENCOUNTER — Ambulatory Visit (HOSPITAL_COMMUNITY): Payer: Medicare PPO | Admitting: Physical Therapy

## 2019-10-17 ENCOUNTER — Encounter (HOSPITAL_COMMUNITY): Payer: Self-pay | Admitting: Physical Therapy

## 2019-10-17 DIAGNOSIS — R29898 Other symptoms and signs involving the musculoskeletal system: Secondary | ICD-10-CM | POA: Diagnosis not present

## 2019-10-17 DIAGNOSIS — M545 Low back pain, unspecified: Secondary | ICD-10-CM

## 2019-10-17 DIAGNOSIS — R2689 Other abnormalities of gait and mobility: Secondary | ICD-10-CM | POA: Diagnosis not present

## 2019-10-17 DIAGNOSIS — M6281 Muscle weakness (generalized): Secondary | ICD-10-CM

## 2019-10-17 NOTE — Patient Instructions (Signed)
Access Code: [email or web address] URL: [email or web address] Date: 10/17/2019 Prepared by: Mitzi Hansen Dayra Rapley  Exercises Abdominal Bracing - 3 x daily - 7 x weekly - 3 sets - 10 reps - 5 hold Supine March - 3 x daily - 7 x weekly - 3 sets - 10 reps

## 2019-10-17 NOTE — Therapy (Signed)
[facility] [place], [place], [address] Phone: [phone]   Fax:  [phone]  Physical Therapy Treatment  Patient Details  Name: Cameron Wu MRN: [id number] Date of Birth: [birth year] Referring Provider (PT): [name] MD   Encounter Date: 10/17/2019   PT End of Session - 10/17/19 1405    Visit Number 11    Number of Visits 20    Date for PT Re-Evaluation 11/12/19    Authorization Type [employer] (auth via cohere, visits based on med necessity)    Authorization Time Period 12 visits approved 5/24-->10/22/19; requesting 8 visits 6/28-7/26/21    Authorization - Visit Number 1    Authorization - Number of Visits 8    Progress Note Due on Visit 20    PT Start Time 3267    PT Stop Time 1430    PT Time Calculation (min) 43 min    Activity Tolerance Patient tolerated treatment well;Patient limited by pain;No increased pain    Behavior During Therapy WFL for tasks assessed/performed           Past Medical History:  Diagnosis Date  . Abnormal liver function tests 05/03/2011  . BPH (benign prostatic hypertrophy) 05/03/2011  . Elevated transaminase level   . GERD (gastroesophageal reflux disease)   . Hairy cell leukemia   . Hairy cell leukemia    1992  . Hairy cell leukemia, in remission ([place]) 05/03/2011  . IBS (irritable bowel syndrome) 05/03/2011  . Irritable bowel syndrome    2 yrs ago, states he had inflammation around anus. Biopsy: inclusive    Past Surgical History:  Procedure Laterality Date  . BONE MARROW BIOPSY     2007  . COLONOSCOPY  08   with polyps  . COLONOSCOPY  09/30/2011   Procedure: COLONOSCOPY;  Surgeon: Rogene Houston, MD;  [facility];  Service: Endoscopy;  Laterality: N/A;  730  . COLONOSCOPY N/A 01/11/2019   Procedure: COLONOSCOPY;  Surgeon: Rogene Houston, MD;  [facility];  Service: Endoscopy;  Laterality: N/A;  100-office move to 9:30am  . HX COLON POLYPS     . TONSILLECTOMY     childhood    There were no vitals filed for this visit.   Subjective Assessment - 10/17/19 1347    Subjective Patient states no change in things today. When he woke up Tuesday and was feeling good in the morning but pain then increased after that. He does the exercises but relief is short lived.    Pertinent History hx back pain, scolosis possibly    Patient Stated Goals get back to normal and get back to working in the yard    Currently in Pain? Yes    Pain Score 6     Pain Location Knee    Pain Orientation Right    Pain Onset More than a month ago                             [facility] Adult PT Treatment/Exercise - 10/17/19 0001      Lumbar Exercises: Supine   Ab Set 20 reps    AB Set Limitations 5 sets    Bent Knee Raise 20 reps    Bent Knee Raise Limitations 3 sets      Manual Therapy   Manual Therapy Muscle Energy Technique    Manual therapy comments Manual complete separate than rest of tx  Muscle Energy Technique contract relax in sidelying R QL                  PT Education - 10/17/19 1401    Education Details Patient educated on HEP, mechanics of exercise, POC, LBP    Person(s) Educated Patient    Methods Explanation;Demonstration    Comprehension Verbalized understanding;Returned demonstration            PT Short Term Goals - 10/15/19 1351      PT SHORT TERM GOAL #1   Title Patient will be independent with HEP in order to improve functional outcomes.    Time 3    Period Weeks    Status Achieved    Target Date 10/01/19      PT SHORT TERM GOAL #2   Title Patient will report at least 25% improvement in symptoms for improved quality of life.    Time 3    Period Weeks    Status Achieved    Target Date 10/01/19             PT Long Term Goals - 10/15/19 1354      PT LONG TERM GOAL #1   Title Patient will report at least 75% improvement in symptoms for improved quality of life.    Time 6    Period  Weeks    Status On-going      PT LONG TERM GOAL #2   Title Patient will improve FOTO score by at least 5 points in order to indicate improved tolerance to activity.    Time 6    Period Weeks    Status Achieved      PT LONG TERM GOAL #3   Title Patient will be able to complete 5x STS in under 11.4 seconds in order to reduce the risk of falls.    Time 6    Period Weeks    Status On-going      PT LONG TERM GOAL #4   Title Patient will demonstrate at least 25% improvement in lumbar ROM in all planes for improved ability to move for yard work.    Time 6    Period Weeks    Status On-going                 Plan - 10/17/19 1406    Clinical Impression Statement Discussed POC with patient, further options for treatment both with PT and other routes. Decided on continuing with QL lengthening and strengthening as well as core strengthening for POC going forward. Patient experiences initial decrease in symptoms following manual therapy which returns after and does not decrease following second round. He requires frequent verbal and tactile cueing for core activation with ab sets and requires frequent rest breaks/resetting core secondary to impaired core strength. He has c/o burning pain in hamstring and less tingling in foot with abdominal sets/marches in supine. Patient will benefit from skilled physical therapy in order to improve function and reduce impairment.    Personal Factors and Comorbidities Comorbidity 2;Past/Current Experience;Behavior Pattern    Comorbidities hx back pain, possible Scoliosis    Examination-Activity Limitations Bathing;Bed Mobility;Bend;Carry;Dressing;Lift;Locomotion Level;Sit;Sleep;Squat;Stairs;Stand;Transfers    Examination-Participation Restrictions Church;Cleaning;Community Activity;Driving;Meal Prep;Shop;Volunteer;Yard Work    [employer] officer Evolving/Moderate complexity    Rehab Potential Good    PT Frequency 2x / week    PT Duration 4  weeks    PT Treatment/Interventions ADLs/Self Care Home Management;Aquatic Therapy;Biofeedback;Canalith Repostioning;Cryotherapy;Electrical Stimulation;Iontophoresis 38m/ml Dexamethasone;Moist Heat;Traction;Ultrasound;DME Instruction;Gait training;Stair training;Functional mobility training;Therapeutic activities;Therapeutic  exercise;Balance training;Neuromuscular re-education;Patient/family education;Manual techniques;Dry needling;Energy conservation;Splinting;Compression bandaging;Scar mobilization;Passive range of motion;Spinal Manipulations;Joint Manipulations    PT Next Visit Plan TRA activation in lying and progress to sitting/standing as able. possibly continue with contract relax for QL lengthening. Tall posture with TRA activation seated and working towards standing, muscle activation against gravity (hip and core). STM to R QL as needed, lengthening and strengthening of QL. Extension based exercises if patient presents with low back pain.    PT Home Exercise Plan 5/25 prone on elbows, prone press ups 6/10 hip extension, ab sets; 6/15: hamstring and piriformis stretch    Consulted and Agree with Plan of Care Patient           Patient will benefit from skilled therapeutic intervention in order to improve the following deficits and impairments:  Abnormal gait, Decreased activity tolerance, Decreased balance, Decreased mobility, Decreased range of motion, Hypomobility, Decreased strength, Impaired sensation, Postural dysfunction, Improper body mechanics, Impaired flexibility, Pain  Visit Diagnosis: Other abnormalities of gait and mobility  Muscle weakness (generalized)  Other symptoms and signs involving the musculoskeletal system  Low back pain, unspecified back pain laterality, unspecified chronicity, unspecified whether sciatica present     Problem List Patient Active Problem List   Diagnosis Date Noted  . Hx of colonic polyps 10/26/2018  . Hairy cell leukemia, in remission ([place])  05/03/2011  . IBS (irritable bowel syndrome) 05/03/2011  . BPH (benign prostatic hypertrophy) 05/03/2011  . Abnormal liver function tests 05/03/2011    2:34 PM, 10/17/19 Mearl Latin PT, DPT Physical Therapist at [facility] [place], [place], [address] Phone: [phone]   Fax:  [phone]  Name: ABDIRAHIM FLAVELL MRN: [id number] Date of Birth: 1951/04/19

## 2019-10-23 ENCOUNTER — Ambulatory Visit (HOSPITAL_COMMUNITY): Payer: Medicare PPO | Attending: Internal Medicine

## 2019-10-23 ENCOUNTER — Encounter (HOSPITAL_COMMUNITY): Payer: Self-pay

## 2019-10-23 ENCOUNTER — Other Ambulatory Visit: Payer: Self-pay

## 2019-10-23 DIAGNOSIS — M6281 Muscle weakness (generalized): Secondary | ICD-10-CM | POA: Diagnosis not present

## 2019-10-23 DIAGNOSIS — M545 Low back pain, unspecified: Secondary | ICD-10-CM

## 2019-10-23 DIAGNOSIS — R29898 Other symptoms and signs involving the musculoskeletal system: Secondary | ICD-10-CM | POA: Diagnosis not present

## 2019-10-23 DIAGNOSIS — R2689 Other abnormalities of gait and mobility: Secondary | ICD-10-CM | POA: Insufficient documentation

## 2019-10-23 NOTE — Therapy (Signed)
[place], [place], [address] Phone: [phone]   Fax:  [phone]  Physical Therapy Treatment  Patient Details  Name: Cameron Wu MRN: [id number] Date of Birth: 1950/10/28 Referring Provider (PT): [name] MD   Encounter Date: 10/23/2019   PT End of Session - 10/23/19 1422    Visit Number 12    Number of Visits 20    Date for PT Re-Evaluation 11/12/19    Authorization Type [employer] (auth via cohere, visits based on med necessity)    Authorization Time Period 12 visits approved 5/24-->10/22/19; requesting 8 visits 6/28-7/26/21    Authorization - Visit Number 2    Authorization - Number of Visits 8    Progress Note Due on Visit 20    PT Start Time 6333    PT Stop Time 1446    PT Time Calculation (min) 41 min    Activity Tolerance Patient tolerated treatment well;Patient limited by pain;No increased pain    Behavior During Therapy WFL for tasks assessed/performed           Past Medical History:  Diagnosis Date  . Abnormal liver function tests 05/03/2011  . BPH (benign prostatic hypertrophy) 05/03/2011  . Elevated transaminase level   . GERD (gastroesophageal reflux disease)   . Hairy cell leukemia   . Hairy cell leukemia    1992  . Hairy cell leukemia, in remission ([place]) 05/03/2011  . IBS (irritable bowel syndrome) 05/03/2011  . Irritable bowel syndrome    2 yrs ago, states he had inflammation around anus. Biopsy: inclusive    Past Surgical History:  Procedure Laterality Date  . BONE MARROW BIOPSY     2007  . COLONOSCOPY  08   with polyps  . COLONOSCOPY  09/30/2011   Procedure: COLONOSCOPY;  Surgeon: Rogene Houston, MD;  [facility];  Service: Endoscopy;  Laterality: N/A;  730  . COLONOSCOPY N/A 01/11/2019   Procedure: COLONOSCOPY;  Surgeon: Rogene Houston, MD;  [facility];  Service: Endoscopy;  Laterality: N/A;  100-office move to 9:30am  . HX COLON POLYPS     . TONSILLECTOMY     childhood    There were no vitals filed for this visit.   Subjective Assessment - 10/23/19 1411    Subjective Pt stated his back is feeling good. Continues to have radicuar symptoms down Rt LE to anterior and lateral ascept of foot.    Pertinent History hx back pain, scolosis possibly    Patient Stated Goals get back to normal and get back to working in the yard    Currently in Pain? Yes    Pain Location Leg    Pain Orientation Right    Pain Descriptors / Indicators Tightness;Aching;Burning    Pain Type Chronic pain    Pain Onset More than a month ago    Pain Frequency Intermittent    Aggravating Factors  pressure on Rt side, weight bearing    Pain Relieving Factors hot shower, cream, prone exercises    Effect of Pain on Daily Activities limits                             [facility] Adult PT Treatment/Exercise - 10/23/19 0001      Lumbar Exercises: Stretches   Other Lumbar Stretch Exercise seated QL stretch 3x 30"      Lumbar Exercises: Supine   Ab Set  20 reps    AB Set Limitations 3 sets    Clam 10 reps;3 seconds    Clam Limitations cueing for core activation    Bent Knee Raise 20 reps    Bent Knee Raise Limitations 2 sets      Manual Therapy   Manual Therapy Muscle Energy Technique    Manual therapy comments Manual complete separate than rest of tx    Muscle Energy Technique contract relax in sidelying R QL 3 sets; 10" holds then 30" manual stretch                    PT Short Term Goals - 10/15/19 1351      PT SHORT TERM GOAL #1   Title Patient will be independent with HEP in order to improve functional outcomes.    Time 3    Period Weeks    Status Achieved    Target Date 10/01/19      PT SHORT TERM GOAL #2   Title Patient will report at least 25% improvement in symptoms for improved quality of life.    Time 3    Period Weeks    Status Achieved    Target Date 10/01/19             PT Long Term Goals -  10/15/19 1354      PT LONG TERM GOAL #1   Title Patient will report at least 75% improvement in symptoms for improved quality of life.    Time 6    Period Weeks    Status On-going      PT LONG TERM GOAL #2   Title Patient will improve FOTO score by at least 5 points in order to indicate improved tolerance to activity.    Time 6    Period Weeks    Status Achieved      PT LONG TERM GOAL #3   Title Patient will be able to complete 5x STS in under 11.4 seconds in order to reduce the risk of falls.    Time 6    Period Weeks    Status On-going      PT LONG TERM GOAL #4   Title Patient will demonstrate at least 25% improvement in lumbar ROM in all planes for improved ability to move for yard work.    Time 6    Period Weeks    Status On-going                 Plan - 10/23/19 1429    Clinical Impression Statement Continued manual MET for QL lengthening following reports of positive effects last session.  Progressed core strengthening with additional clams for stability with verbal and tactile cueing for core activation.  Instructed seated QL stretch.  EOS reports pain reduce to 6/10.    Personal Factors and Comorbidities Comorbidity 2;Past/Current Experience;Behavior Pattern    Comorbidities hx back pain, possible Scoliosis    Examination-Activity Limitations Bathing;Bed Mobility;Bend;Carry;Dressing;Lift;Locomotion Level;Sit;Sleep;Squat;Stairs;Stand;Transfers    Examination-Participation Restrictions Church;Cleaning;Community Activity;Driving;Meal Prep;Shop;Volunteer;Yard Work    [employer]chant [employer] officer Evolving/Moderate complexity    Clinical Decision Making Moderate    Rehab Potential Good    PT Frequency 2x / week    PT Duration 4 weeks    PT Treatment/Interventions ADLs/Self Care Home Management;Aquatic Therapy;Biofeedback;Canalith Repostioning;Cryotherapy;Electrical Stimulation;Iontophoresis 54m/ml Dexamethasone;Moist Heat;Traction;Ultrasound;DME Instruction;Gait  training;Stair training;Functional mobility training;Therapeutic activities;Therapeutic exercise;Balance training;Neuromuscular re-education;Patient/family education;Manual techniques;Dry needling;Energy conservation;Splinting;Compression bandaging;Scar mobilization;Passive range of motion;Spinal Manipulations;Joint Manipulations    PT Next Visit Plan TRA  activation in lying and progress to sitting/standing as able. possibly continue with contract relax for QL lengthening. Tall posture with TRA activation seated and working towards standing, muscle activation against gravity (hip and core). STM to R QL as needed, lengthening and strengthening of QL. Extension based exercises if patient presents with low back pain.    PT Home Exercise Plan 5/25 prone on elbows, prone press ups 6/10 hip extension, ab sets; 6/15: hamstring and piriformis stretch           Patient will benefit from skilled therapeutic intervention in order to improve the following deficits and impairments:  Abnormal gait, Decreased activity tolerance, Decreased balance, Decreased mobility, Decreased range of motion, Hypomobility, Decreased strength, Impaired sensation, Postural dysfunction, Improper body mechanics, Impaired flexibility, Pain  Visit Diagnosis: Muscle weakness (generalized)  Other abnormalities of gait and mobility  Other symptoms and signs involving the musculoskeletal system  Low back pain, unspecified back pain laterality, unspecified chronicity, unspecified whether sciatica present     Problem List Patient Active Problem List   Diagnosis Date Noted  . Hx of colonic polyps 10/26/2018  . Hairy cell leukemia, in remission ([place]) 05/03/2011  . IBS (irritable bowel syndrome) 05/03/2011  . BPH (benign prostatic hypertrophy) 05/03/2011  . Abnormal liver function tests 05/03/2011   Ihor Austin, LPTA/CLT; CBIS [phone]  Aldona Lento 10/23/2019, 6:55 PM  [place], [place], [address] Phone: [phone]   Fax:  [phone]  Name: Cameron Wu MRN: [id number] Date of Birth: 13-Jun-1950

## 2019-10-25 ENCOUNTER — Encounter (HOSPITAL_COMMUNITY): Payer: Self-pay

## 2019-10-25 ENCOUNTER — Ambulatory Visit (HOSPITAL_COMMUNITY): Payer: Medicare PPO

## 2019-10-25 ENCOUNTER — Other Ambulatory Visit: Payer: Self-pay | Admitting: Internal Medicine

## 2019-10-25 ENCOUNTER — Other Ambulatory Visit: Payer: Self-pay

## 2019-10-25 DIAGNOSIS — M5416 Radiculopathy, lumbar region: Secondary | ICD-10-CM

## 2019-10-25 DIAGNOSIS — R29898 Other symptoms and signs involving the musculoskeletal system: Secondary | ICD-10-CM

## 2019-10-25 DIAGNOSIS — M6281 Muscle weakness (generalized): Secondary | ICD-10-CM | POA: Diagnosis not present

## 2019-10-25 DIAGNOSIS — M545 Low back pain, unspecified: Secondary | ICD-10-CM

## 2019-10-25 DIAGNOSIS — R2689 Other abnormalities of gait and mobility: Secondary | ICD-10-CM | POA: Diagnosis not present

## 2019-10-25 NOTE — Therapy (Signed)
[place], [place], [address] Phone: [phone]   Fax:  [phone]  Physical Therapy Treatment  Patient Details  Name: Cameron Wu MRN: [id number] Date of Birth: 1950-12-06 Referring Provider (PT): [name] MD   Encounter Date: 10/25/2019   PT End of Session - 10/25/19 1322    Visit Number 13    Number of Visits 20    Date for PT Re-Evaluation 11/12/19    Authorization Type [employer] (auth via cohere, visits based on med necessity)    Authorization Time Period 12 visits approved 5/24-->10/22/19; requesting 8 visits 6/28-7/26/21    Authorization - Visit Number 3    Authorization - Number of Visits 8    Progress Note Due on Visit 20    PT Start Time 3299    PT Stop Time 1352    PT Time Calculation (min) 40 min    Activity Tolerance Patient tolerated treatment well;Patient limited by pain;No increased pain    Behavior During Therapy WFL for tasks assessed/performed           Past Medical History:  Diagnosis Date  . Abnormal liver function tests 05/03/2011  . BPH (benign prostatic hypertrophy) 05/03/2011  . Elevated transaminase level   . GERD (gastroesophageal reflux disease)   . Hairy cell leukemia   . Hairy cell leukemia    1992  . Hairy cell leukemia, in remission ([place]) 05/03/2011  . IBS (irritable bowel syndrome) 05/03/2011  . Irritable bowel syndrome    2 yrs ago, states he had inflammation around anus. Biopsy: inclusive    Past Surgical History:  Procedure Laterality Date  . BONE MARROW BIOPSY     2007  . COLONOSCOPY  08   with polyps  . COLONOSCOPY  09/30/2011   Procedure: COLONOSCOPY;  Surgeon: Rogene Houston, MD;  [facility];  Service: Endoscopy;  Laterality: N/A;  730  . COLONOSCOPY N/A 01/11/2019   Procedure: COLONOSCOPY;  Surgeon: Rogene Houston, MD;  [facility];  Service: Endoscopy;  Laterality: N/A;  100-office move to 9:30am  . HX COLON POLYPS     . TONSILLECTOMY     childhood    There were no vitals filed for this visit.   Subjective Assessment - 10/25/19 1317    Subjective Pt stated he was tired following last session, had a good night sleep until had to turn then able to go back to sleep.  Pain currently 5/10 from Rt calf to medial<posterior>lateral ankle    Pertinent History hx back pain, scolosis possibly    Patient Stated Goals get back to normal and get back to working in the yard    Currently in Pain? Yes    Pain Score 5     Pain Location Ankle    Pain Orientation Right    Pain Descriptors / Indicators Numbness;Pins and needles;Burning    Pain Type Chronic pain    Pain Onset More than a month ago    Pain Frequency Intermittent    Aggravating Factors  weight bearing, pressure on Rt side    Pain Relieving Factors hot shower. cream, prone exercises    Effect of Pain on Daily Activities limits                             [facility] Adult PT Treatment/Exercise - 10/25/19 0001      Lumbar Exercises: Supine  Ab Set 20 reps    Dead Bug 10 reps;3 seconds    Dead Bug Limitations modified, cueing for sequence      Lumbar Exercises: Quadruped   Single Arm Raise 10 reps;Right;Left;3 seconds    Straight Leg Raise 5 reps;3 seconds    Other Quadruped Lumbar Exercises Abdominal sets 10x 5"      Manual Therapy   Manual Therapy Muscle Energy Technique    Manual therapy comments Manual complete separate than rest of tx    Muscle Energy Technique contract relax in sidelying R QL 3 sets; 10" holds then 30" manual stretch                    PT Short Term Goals - 10/15/19 1351      PT SHORT TERM GOAL #1   Title Patient will be independent with HEP in order to improve functional outcomes.    Time 3    Period Weeks    Status Achieved    Target Date 10/01/19      PT SHORT TERM GOAL #2   Title Patient will report at least 25% improvement in symptoms for improved quality of life.    Time 3    Period  Weeks    Status Achieved    Target Date 10/01/19             PT Long Term Goals - 10/15/19 1354      PT LONG TERM GOAL #1   Title Patient will report at least 75% improvement in symptoms for improved quality of life.    Time 6    Period Weeks    Status On-going      PT LONG TERM GOAL #2   Title Patient will improve FOTO score by at least 5 points in order to indicate improved tolerance to activity.    Time 6    Period Weeks    Status Achieved      PT LONG TERM GOAL #3   Title Patient will be able to complete 5x STS in under 11.4 seconds in order to reduce the risk of falls.    Time 6    Period Weeks    Status On-going      PT LONG TERM GOAL #4   Title Patient will demonstrate at least 25% improvement in lumbar ROM in all planes for improved ability to move for yard work.    Time 6    Period Weeks    Status On-going                 Plan - 10/25/19 1330    Clinical Impression Statement Added quadruped for abdominal strengthening and posterior chain strengthening to assist with posture.  Pt able to complete with good mechanics, min cueing for stability with task.  Continued manual  Rt QL MET for lengthening wiht reports of relief following.  Pt continues to complain on burning Rt ankle, increased sensations iwht weight bearing.    Personal Factors and Comorbidities Comorbidity 2;Past/Current Experience;Behavior Pattern    Comorbidities hx back pain, possible Scoliosis    Examination-Activity Limitations Bathing;Bed Mobility;Bend;Carry;Dressing;Lift;Locomotion Level;Sit;Sleep;Squat;Stairs;Stand;Transfers    Examination-Participation Restrictions Church;Cleaning;Community Activity;Driving;Meal Prep;Shop;Volunteer;Yard Work    [employer]chant [employer] officer Evolving/Moderate complexity    Clinical Decision Making Moderate    Rehab Potential Good    PT Frequency 2x / week    PT Duration 4 weeks    PT Treatment/Interventions ADLs/Self Care Home Management;Aquatic  Therapy;Biofeedback;Canalith Repostioning;Cryotherapy;Electrical Stimulation;Iontophoresis 29m/ml Dexamethasone;Moist Heat;Traction;Ultrasound;DME  Instruction;Gait training;Stair training;Functional mobility training;Therapeutic activities;Therapeutic exercise;Balance training;Neuromuscular re-education;Patient/family education;Manual techniques;Dry needling;Energy conservation;Splinting;Compression bandaging;Scar mobilization;Passive range of motion;Spinal Manipulations;Joint Manipulations    PT Next Visit Plan TRA activation in lying and progress to sitting/standing as able. possibly continue with contract relax for QL lengthening. Tall posture with TRA activation seated and working towards standing, muscle activation against gravity (hip and core). STM to R QL as needed, lengthening and strengthening of QL. Extension based exercises if patient presents with low back pain.    PT Home Exercise Plan 5/25 prone on elbows, prone press ups 6/10 hip extension, ab sets; 6/15: hamstring and piriformis stretch           Patient will benefit from skilled therapeutic intervention in order to improve the following deficits and impairments:  Abnormal gait, Decreased activity tolerance, Decreased balance, Decreased mobility, Decreased range of motion, Hypomobility, Decreased strength, Impaired sensation, Postural dysfunction, Improper body mechanics, Impaired flexibility, Pain  Visit Diagnosis: Low back pain, unspecified back pain laterality, unspecified chronicity, unspecified whether sciatica present  Other symptoms and signs involving the musculoskeletal system  Other abnormalities of gait and mobility  Muscle weakness (generalized)     Problem List Patient Active Problem List   Diagnosis Date Noted  . Hx of colonic polyps 10/26/2018  . Hairy cell leukemia, in remission ([place]) 05/03/2011  . IBS (irritable bowel syndrome) 05/03/2011  . BPH (benign prostatic hypertrophy) 05/03/2011  . Abnormal liver  function tests 05/03/2011   Ihor Austin, LPTA/CLT; CBIS [phone]  Aldona Lento 10/25/2019, 1:57 PM  [place], [place], [address] Phone: [phone]   Fax:  [phone]  Name: BRADFORD CAZIER MRN: [id number] Date of Birth: [birth year]

## 2019-11-01 ENCOUNTER — Other Ambulatory Visit: Payer: Self-pay

## 2019-11-01 ENCOUNTER — Ambulatory Visit (HOSPITAL_COMMUNITY): Payer: Medicare PPO

## 2019-11-01 ENCOUNTER — Encounter (HOSPITAL_COMMUNITY): Payer: Self-pay

## 2019-11-01 DIAGNOSIS — M6281 Muscle weakness (generalized): Secondary | ICD-10-CM | POA: Diagnosis not present

## 2019-11-01 DIAGNOSIS — R29898 Other symptoms and signs involving the musculoskeletal system: Secondary | ICD-10-CM

## 2019-11-01 DIAGNOSIS — M545 Low back pain, unspecified: Secondary | ICD-10-CM

## 2019-11-01 DIAGNOSIS — R2689 Other abnormalities of gait and mobility: Secondary | ICD-10-CM

## 2019-11-01 NOTE — Therapy (Signed)
[place], [place], [address] Phone: [phone]   Fax:  [phone]  Physical Therapy Treatment  Patient Details  Name: Cameron Wu MRN: [id number] Date of Birth: [birth year] Referring Provider (PT): [name] MD   Encounter Date: 11/01/2019   PT End of Session - 11/01/19 1710    Visit Number 14    Number of Visits 20    Date for PT Re-Evaluation 11/12/19    Authorization Type [employer] (auth via cohere, visits based on med necessity)    Authorization Time Period 12 visits approved 5/24-->10/22/19; requesting 8 visits 6/28-7/26/21    Authorization - Visit Number 4    Authorization - Number of Visits 8    Progress Note Due on Visit 20    PT Start Time 2836    PT Stop Time 1741    PT Time Calculation (min) 38 min    Activity Tolerance Patient tolerated treatment well;Patient limited by pain;No increased pain    Behavior During Therapy WFL for tasks assessed/performed           Past Medical History:  Diagnosis Date  . Abnormal liver function tests 05/03/2011  . BPH (benign prostatic hypertrophy) 05/03/2011  . Elevated transaminase level   . GERD (gastroesophageal reflux disease)   . Hairy cell leukemia   . Hairy cell leukemia    1992  . Hairy cell leukemia, in remission ([place]) 05/03/2011  . IBS (irritable bowel syndrome) 05/03/2011  . Irritable bowel syndrome    2 yrs ago, states he had inflammation around anus. Biopsy: inclusive    Past Surgical History:  Procedure Laterality Date  . BONE MARROW BIOPSY     2007  . COLONOSCOPY  08   with polyps  . COLONOSCOPY  09/30/2011   Procedure: COLONOSCOPY;  Surgeon: Rogene Houston, MD;  [facility];  Service: Endoscopy;  Laterality: N/A;  730  . COLONOSCOPY N/A 01/11/2019   Procedure: COLONOSCOPY;  Surgeon: Rogene Houston, MD;  [facility];  Service: Endoscopy;  Laterality: N/A;  100-office move to 9:30am  . HX COLON POLYPS     . TONSILLECTOMY     childhood    There were no vitals filed for this visit.   Subjective Assessment - 11/01/19 1708    Subjective Pt stated he has burning pain posterior Rt knee and tingling from calf to ankle.  Reports pain is worse and night.  Stated he can stand tall in the morning and bends over the later in the day.    Pertinent History hx back pain, scolosis possibly    Patient Stated Goals get back to normal and get back to working in the yard    Currently in Pain? Yes    Pain Score 7     Pain Location Leg    Pain Orientation Right    Pain Descriptors / Indicators Burning;Pins and needles;Numbness    Pain Type Chronic pain    Pain Onset More than a month ago    Pain Frequency Intermittent    Aggravating Factors  weight bearing, pressure on Rt side    Pain Relieving Factors hot shower, cream, prone exercises    Effect of Pain on Daily Activities limits                             [facility] Adult PT Treatment/Exercise - 11/01/19 0001  Lumbar Exercises: Stretches   Active Hamstring Stretch 3 reps;30 seconds    Active Hamstring Stretch Limitations supine with hands behind knees    Prone on Elbows Stretch Limitations 2 minutes    Press Ups 5 reps;10 reps      Lumbar Exercises: Sidelying   Other Sidelying Lumbar Exercises TRA activation 20x 5"      Lumbar Exercises: Prone   Other Prone Lumbar Exercises shoulder extension and row 10      Manual Therapy   Manual Therapy Muscle Energy Technique    Manual therapy comments Manual complete separate than rest of tx    Muscle Energy Technique contract relax in sidelying R QL 3 sets; 10" holds then 30" manual stretch                    PT Short Term Goals - 10/15/19 1351      PT SHORT TERM GOAL #1   Title Patient will be independent with HEP in order to improve functional outcomes.    Time 3    Period Weeks    Status Achieved    Target Date 10/01/19      PT SHORT TERM GOAL #2   Title  Patient will report at least 25% improvement in symptoms for improved quality of life.    Time 3    Period Weeks    Status Achieved    Target Date 10/01/19             PT Long Term Goals - 10/15/19 1354      PT LONG TERM GOAL #1   Title Patient will report at least 75% improvement in symptoms for improved quality of life.    Time 6    Period Weeks    Status On-going      PT LONG TERM GOAL #2   Title Patient will improve FOTO score by at least 5 points in order to indicate improved tolerance to activity.    Time 6    Period Weeks    Status Achieved      PT LONG TERM GOAL #3   Title Patient will be able to complete 5x STS in under 11.4 seconds in order to reduce the risk of falls.    Time 6    Period Weeks    Status On-going      PT LONG TERM GOAL #4   Title Patient will demonstrate at least 25% improvement in lumbar ROM in all planes for improved ability to move for yard work.    Time 6    Period Weeks    Status On-going                 Plan - 11/01/19 1735    Clinical Impression Statement Added postural strengthening in prone and began TRA activaiton in sidelying.  Pt presents wiht good activation in new position with TRA activation.  Pt reports pain reduced to 4/10 following therex and reports of relief following QL MET for lengthening.    Personal Factors and Comorbidities Comorbidity 2;Past/Current Experience;Behavior Pattern    Comorbidities hx back pain, possible Scoliosis    Examination-Activity Limitations Bathing;Bed Mobility;Bend;Carry;Dressing;Lift;Locomotion Level;Sit;Sleep;Squat;Stairs;Stand;Transfers    Examination-Participation Restrictions Church;Cleaning;Community Activity;Driving;Meal Prep;Shop;Volunteer;Yard Work    [employer]chant [employer] officer Evolving/Moderate complexity    Clinical Decision Making Moderate    Rehab Potential Good    PT Frequency 2x / week    PT Duration 4 weeks    PT Treatment/Interventions ADLs/Self Care Home  Management;Aquatic  Therapy;Biofeedback;Canalith Repostioning;Cryotherapy;Electrical Stimulation;Iontophoresis 53m/ml Dexamethasone;Moist Heat;Traction;Ultrasound;DME Instruction;Gait training;Stair training;Functional mobility training;Therapeutic activities;Therapeutic exercise;Balance training;Neuromuscular re-education;Patient/family education;Manual techniques;Dry needling;Energy conservation;Splinting;Compression bandaging;Scar mobilization;Passive range of motion;Spinal Manipulations;Joint Manipulations    PT Next Visit Plan TRA activation in lying and progress to sitting/standing as able. possibly continue with contract relax for QL lengthening. Tall posture with TRA activation seated and working towards standing, muscle activation against gravity (hip and core). STM to R QL as needed, lengthening and strengthening of QL. Extension based exercises if patient presents with low back pain.    PT Home Exercise Plan 5/25 prone on elbows, prone press ups 6/10 hip extension, ab sets; 6/15: hamstring and piriformis stretch           Patient will benefit from skilled therapeutic intervention in order to improve the following deficits and impairments:  Abnormal gait, Decreased activity tolerance, Decreased balance, Decreased mobility, Decreased range of motion, Hypomobility, Decreased strength, Impaired sensation, Postural dysfunction, Improper body mechanics, Impaired flexibility, Pain  Visit Diagnosis: Other symptoms and signs involving the musculoskeletal system  Other abnormalities of gait and mobility  Muscle weakness (generalized)  Low back pain, unspecified back pain laterality, unspecified chronicity, unspecified whether sciatica present     Problem List Patient Active Problem List   Diagnosis Date Noted  . Hx of colonic polyps 10/26/2018  . [place] 05/03[place]  . IBS (irritable bowel syndrome) 05/03/2011  . BPH (benign prostatic hypertrophy) 05/03/2011   . Abnormal liver function tests 05/03/2011   CIhor Austin LPTA/CLT; CBIS [phone] CAldona Lento7/15/2021, 5:43 PM  [place] [address]Phone: [phone]  Fax:  [phone] Name: Cameron ZAPIENMRN: [id number] of Birth: [birth year]

## 2019-11-06 ENCOUNTER — Encounter (HOSPITAL_COMMUNITY): Payer: Self-pay | Admitting: Physical Therapy

## 2019-11-06 ENCOUNTER — Ambulatory Visit (HOSPITAL_COMMUNITY): Payer: Medicare PPO | Admitting: Physical Therapy

## 2019-11-06 ENCOUNTER — Other Ambulatory Visit: Payer: Self-pay

## 2019-11-06 DIAGNOSIS — R29898 Other symptoms and signs involving the musculoskeletal system: Secondary | ICD-10-CM | POA: Diagnosis not present

## 2019-11-06 DIAGNOSIS — M545 Low back pain, unspecified: Secondary | ICD-10-CM

## 2019-11-06 DIAGNOSIS — M6281 Muscle weakness (generalized): Secondary | ICD-10-CM | POA: Diagnosis not present

## 2019-11-06 DIAGNOSIS — R2689 Other abnormalities of gait and mobility: Secondary | ICD-10-CM | POA: Diagnosis not present

## 2019-11-06 NOTE — Therapy (Signed)
[place], [place], [address] Phone: [phone]   Fax:  [phone]  Physical Therapy Treatment  Patient Details  Name: Cameron Wu MRN: [id number] Date of Birth: [birth year] Referring Provider (PT): [name] MD   Encounter Date: 11/06/2019   PT End of Session - 11/06/19 1438    Visit Number 15    Number of Visits 20    Date for PT Re-Evaluation 11/12/19    Authorization Type [employer] (auth via cohere, visits based on med necessity)    Authorization Time Period 12 visits approved 5/24-->10/22/19; requesting 8 visits 6/28-7/26/21    Authorization - Visit Number 5    Authorization - Number of Visits 8    Progress Note Due on Visit 81    PT Start Time 1432    PT Stop Time 1513    PT Time Calculation (min) 41 min    Activity Tolerance Patient tolerated treatment well;Patient limited by pain    Behavior During Therapy [facility] for tasks assessed/performed           Past Medical History:  Diagnosis Date   Abnormal liver function tests 05/03/2011   BPH (benign prostatic hypertrophy) 05/03/2011   Elevated transaminase level    GERD (gastroesophageal reflux disease)    Hairy cell leukemia    Hairy cell leukemia    1992   Hairy cell leukemia, in remission ([place]) 05/03/2011   IBS (irritable bowel syndrome) 05/03/2011   Irritable bowel syndrome    2 yrs ago, states he had inflammation around anus. Biopsy: inclusive    Past Surgical History:  Procedure Laterality Date   BONE MARROW BIOPSY     2007   COLONOSCOPY  08   with polyps   COLONOSCOPY  09/30/2011   Procedure: COLONOSCOPY;  Surgeon: Rogene Houston, MD;  [facility];  Service: Endoscopy;  Laterality: N/A;  730   COLONOSCOPY N/A 01/11/2019   Procedure: COLONOSCOPY;  Surgeon: Rogene Houston, MD;  [facility];  Service: Endoscopy;  Laterality: N/A;  100-office move to 9:30am   HX COLON POLYPS     TONSILLECTOMY      childhood    There were no vitals filed for this visit.   Subjective Assessment - 11/06/19 1437    Subjective Patient says his pain goes away when he is doing the exercises, but it comes back. Says he did exercise this morning and felt good, was able to stand straight, but by mid-morning he was back to hunching over.    Pertinent History hx back pain, scolosis possibly    Patient Stated Goals get back to normal and get back to working in the yard    Currently in Pain? Yes    Pain Score 7     Pain Location Leg    Pain Orientation Right    Pain Descriptors / Indicators Burning;Pins and needles    Pain Type Chronic pain    Pain Onset More than a month ago    Pain Frequency Intermittent              [facility] PT Assessment - 11/06/19 0001      Special Tests    Special Tests --   (+) slump test on RLE                         [facility] Adult PT Treatment/Exercise - 11/06/19 0001  Lumbar Exercises: Stretches   Prone on Elbows Stretch 3 reps;60 seconds    Press Ups 10 reps    Press Ups Limitations 2 sets       Lumbar Exercises: Supine   Ab Set 10 reps    AB Set Limitations 5 sec holds     Glut Set 10 reps;5 seconds    Bridge 10 reps;5 seconds    Other Supine Lumbar Exercises supine sciatic nerve glide x15 on RLE                   PT Education - 11/06/19 1623    Education Details on exercise technique and explanation of possible differntial daignosis given presenting symptoms. Discussed POC and upcoming reassessment    Person(s) Educated Patient    Methods Explanation    Comprehension Verbalized understanding            PT Short Term Goals - 10/15/19 1351      PT SHORT TERM GOAL #1   Title Patient will be independent with HEP in order to improve functional outcomes.    Time 3    Period Weeks    Status Achieved    Target Date 10/01/19      PT SHORT TERM GOAL #2   Title Patient will report at least 25% improvement in symptoms for improved  quality of life.    Time 3    Period Weeks    Status Achieved    Target Date 10/01/19             PT Long Term Goals - 10/15/19 1354      PT LONG TERM GOAL #1   Title Patient will report at least 75% improvement in symptoms for improved quality of life.    Time 6    Period Weeks    Status On-going      PT LONG TERM GOAL #2   Title Patient will improve FOTO score by at least 5 points in order to indicate improved tolerance to activity.    Time 6    Period Weeks    Status Achieved      PT LONG TERM GOAL #3   Title Patient will be able to complete 5x STS in under 11.4 seconds in order to reduce the risk of falls.    Time 6    Period Weeks    Status On-going      PT LONG TERM GOAL #4   Title Patient will demonstrate at least 25% improvement in lumbar ROM in all planes for improved ability to move for yard work.    Time 6    Period Weeks    Status On-going                 Plan - 11/06/19 1624    Clinical Impression Statement Patient continues to be limited by pain in WB positions. Discussed possible differentials with patient given his current presentation. Assessed slump testing, patient tested positive, Added supine sciatic nerve glides. Patient reports decreased pain in distal extremity but increased pain behind RT knee. Assessed prone on elbows and press ups progressions. Patient pain rating decreased to 3/10, but increased with patient weight shifting away from RT side within 1-2 minutes of standing afterward. Patient able to perform 10 reps of hip bridging, but with increased report of pain in RT hamstring afterward.  Patient educated on increasing frequency of press ups and core isometrics with HEP to at least 3 x daily if tolerated  for progressing core strengthening. Reassess per end of cert next visit. Adjust POC accordingly.    Personal Factors and Comorbidities Comorbidity 2;Past/Current Experience;Behavior Pattern    Comorbidities hx back pain, possible  Scoliosis    Examination-Activity Limitations Bathing;Bed Mobility;Bend;Carry;Dressing;Lift;Locomotion Level;Sit;Sleep;Squat;Stairs;Stand;Transfers    Examination-Participation Restrictions Church;Cleaning;Community Activity;Driving;Meal Prep;Shop;Volunteer;Yard Work    [employer] Evolving/Moderate complexity    Rehab Potential Good    PT Frequency 2x / week    PT Duration 4 weeks    PT Treatment/Interventions ADLs/Self Care Home Management;Aquatic Therapy;Biofeedback;Canalith Repostioning;Cryotherapy;Electrical Stimulation;Iontophoresis 79m/ml Dexamethasone;Moist Heat;Traction;Ultrasound;DME Instruction;Gait training;Stair training;Functional mobility training;Therapeutic activities;Therapeutic exercise;Balance training;Neuromuscular re-education;Patient/family education;Manual techniques;Dry needling;Energy conservation;Splinting;Compression bandaging;Scar mobilization;Passive range of motion;Spinal Manipulations;Joint Manipulations    PT Next Visit Plan Reassess per end of cert next visit. Adjust POC accordingly.    PT Home Exercise Plan 5/25 prone on elbows, prone press ups 6/10 hip extension, ab sets; 6/15: hamstring and piriformis stretch    Consulted and Agree with Plan of Care Patient           Patient will benefit from skilled therapeutic intervention in order to improve the following deficits and impairments:  Abnormal gait, Decreased activity tolerance, Decreased balance, Decreased mobility, Decreased range of motion, Hypomobility, Decreased strength, Impaired sensation, Postural dysfunction, Improper body mechanics, Impaired flexibility, Pain  Visit Diagnosis: Other symptoms and signs involving the musculoskeletal system  Other abnormalities of gait and mobility  Muscle weakness (generalized)  Low back pain, unspecified back pain laterality, unspecified chronicity, unspecified whether sciatica present     Problem List Patient Active Problem List    Diagnosis Date Noted   Hx of colonic polyps 10/26/2018   [place] 05[place]03[place]   IBS (irritable bowel syndrome) 05/03/2011   BPH (benign prostatic hypertrophy) 05/03/2011   Abnormal liver function tests 05/03/2011   4:32 PM, 11/06/19 CJosue HectorPT DPT  [facility] [phone] [address] [address]Phone: [phone]  Fax:  [phone] Name: Cameron MCENROEMRN: [id number] of Birth: 7March 27, 1952

## 2019-11-07 ENCOUNTER — Ambulatory Visit (HOSPITAL_COMMUNITY)
Admission: RE | Admit: 2019-11-07 | Discharge: 2019-11-07 | Disposition: A | Discharge status: Home / Self Care | Payer: Medicare PPO | Source: Ambulatory Visit | Attending: Internal Medicine | Admitting: Internal Medicine

## 2019-11-07 DIAGNOSIS — M5416 Radiculopathy, lumbar region: Secondary | ICD-10-CM | POA: Insufficient documentation

## 2019-11-07 DIAGNOSIS — M545 Low back pain: Secondary | ICD-10-CM | POA: Diagnosis not present

## 2019-11-08 ENCOUNTER — Ambulatory Visit (HOSPITAL_COMMUNITY): Payer: Medicare PPO | Admitting: Physical Therapy

## 2019-11-08 ENCOUNTER — Encounter (HOSPITAL_COMMUNITY): Payer: Self-pay | Admitting: Physical Therapy

## 2019-11-08 ENCOUNTER — Other Ambulatory Visit: Payer: Self-pay

## 2019-11-08 DIAGNOSIS — M545 Low back pain, unspecified: Secondary | ICD-10-CM

## 2019-11-08 DIAGNOSIS — R2689 Other abnormalities of gait and mobility: Secondary | ICD-10-CM | POA: Diagnosis not present

## 2019-11-08 DIAGNOSIS — M6281 Muscle weakness (generalized): Secondary | ICD-10-CM

## 2019-11-08 DIAGNOSIS — R29898 Other symptoms and signs involving the musculoskeletal system: Secondary | ICD-10-CM | POA: Diagnosis not present

## 2019-11-08 NOTE — Therapy (Signed)
[place], [place], [address] Phone: [phone]   Fax:  [phone]  Physical Therapy Treatment/Progress Notes/Recert  Patient Details  Name: Cameron Wu MRN: [id number] Date of Birth: 05-19-1950 Referring Provider (PT): [name] MD   Encounter Date: 11/08/2019    Progress Note   Reporting Period 10/15/19 to 11/08/19   See note below for Objective Data and Assessment of Progress/Goals    PT End of Session - 11/08/19 1349    Visit Number 16    Number of Visits 28    Date for PT Re-Evaluation 12/06/19    Authorization Type [employer] (auth via cohere, visits based on med necessity)    Authorization Time Period 12 visits approved 5/24-->10/22/19;  8 visits 6/28-7/26/21; requesting 8 visits 7/22-8/19    Authorization - Visit Number 1    Authorization - Number of Visits 8    Progress Note Due on Visit 26    PT Start Time 9371    PT Stop Time 1429    PT Time Calculation (min) 40 min    Activity Tolerance Patient tolerated treatment well;Patient limited by pain    Behavior During Therapy [facility] for tasks assessed/performed           Past Medical History:  Diagnosis Date  . Abnormal liver function tests 05/03/2011  . BPH (benign prostatic hypertrophy) 05/03/2011  . Elevated transaminase level   . GERD (gastroesophageal reflux disease)   . Hairy cell leukemia   . Hairy cell leukemia    1992  . Hairy cell leukemia, in remission ([place]) 05/03/2011  . IBS (irritable bowel syndrome) 05/03/2011  . Irritable bowel syndrome    2 yrs ago, states he had inflammation around anus. Biopsy: inclusive    Past Surgical History:  Procedure Laterality Date  . BONE MARROW BIOPSY     2007  . COLONOSCOPY  08   with polyps  . COLONOSCOPY  09/30/2011   Procedure: COLONOSCOPY;  Surgeon: Rogene Houston, MD;  [facility];  Service: Endoscopy;  Laterality: N/A;  730  . COLONOSCOPY N/A 01/11/2019   Procedure:  COLONOSCOPY;  Surgeon: Rogene Houston, MD;  [facility];  Service: Endoscopy;  Laterality: N/A;  100-office move to 9:30am  . HX COLON POLYPS    . TONSILLECTOMY     childhood    There were no vitals filed for this visit.   Subjective Assessment - 11/08/19 1348    Subjective Patient states he got his MRI report back and it doesn't look good with arthritis and bulging disks. He has been feeling same in his leg over the last several weeks. He feels 50% improvement at this point.    Pertinent History hx back pain, scolosis possibly    Patient Stated Goals get back to normal and get back to working in the yard    Currently in Pain? Yes    Pain Score 5     Pain Location Leg    Pain Onset More than a month ago              [facility] PT Assessment - 11/08/19 0001      Assessment   Medical Diagnosis R Lumbar Racidulopathy    Referring Provider (PT) [name] MD    Onset Date/Surgical Date 08/20/19    Next MD Visit No follow up scheduled    Prior Therapy None      Precautions   Precautions None  Restrictions   Weight Bearing Restrictions No      Balance Screen   Has the patient fallen in the past 6 months No    Has the patient had a decrease in activity level because of a fear of falling?  Yes    Is the patient reluctant to leave their home because of a fear of falling?  Yes      Prior Function   Level of Independence Independent      Cognition   Overall Cognitive Status Within Functional Limits for tasks assessed      Observation/Other Assessments   Observations Ambulates without AD, limited R knee flexion extension ROM during gait    Focus on Therapeutic Outcomes (FOTO)  53% limited      AROM   Lumbar Flexion 0% limited "feels good"    Lumbar Extension 50% limited    Lumbar - Right Side Bend 50% limited     Lumbar - Left Side Bend 0% limited     Lumbar - Right Rotation 0% limited    Lumbar - Left Rotation 0 % limited      Strength   Right Hip Flexion  4+/5    Left Hip Flexion 4/5    Right Knee Flexion 5/5    Right Knee Extension 5/5    Left Knee Flexion 5/5    Left Knee Extension 5/5    Right Ankle Dorsiflexion 5/5    Left Ankle Dorsiflexion 5/5      Transfers   Five time sit to stand comments  11.52 seconds     Comments with bilateral UE Use, relies on LLE                          [facility] Adult PT Treatment/Exercise - 11/08/19 0001      Lumbar Exercises: Stretches   Single Knee to Chest Stretch Limitations 2x10 with 3 second holds    Other Lumbar Stretch Exercise seated lumbar flexion repeated 2x15                   PT Education - 11/08/19 1349    Education Details Patient educated on HEP, mechanics of exercise, POC, LBP, imaging, OA, disc problems    Person(s) Educated Patient    Methods Explanation;Demonstration    Comprehension Verbalized understanding;Returned demonstration            PT Short Term Goals - 10/15/19 1351      PT SHORT TERM GOAL #1   Title Patient will be independent with HEP in order to improve functional outcomes.    Time 3    Period Weeks    Status Achieved    Target Date 10/01/19      PT SHORT TERM GOAL #2   Title Patient will report at least 25% improvement in symptoms for improved quality of life.    Time 3    Period Weeks    Status Achieved    Target Date 10/01/19             PT Long Term Goals - 10/15/19 1354      PT LONG TERM GOAL #1   Title Patient will report at least 75% improvement in symptoms for improved quality of life.    Time 6    Period Weeks    Status On-going      PT LONG TERM GOAL #2   Title Patient will improve FOTO score by at least 5 points  in order to indicate improved tolerance to activity.    Time 6    Period Weeks    Status Achieved      PT LONG TERM GOAL #3   Title Patient will be able to complete 5x STS in under 11.4 seconds in order to reduce the risk of falls.    Time 6    Period Weeks    Status On-going      PT LONG  TERM GOAL #4   Title Patient will demonstrate at least 25% improvement in lumbar ROM in all planes for improved ability to move for yard work.    Time 6    Period Weeks    Status On-going                 Plan - 11/08/19 1349    Clinical Impression Statement Patient has met 2/2 short term goals with ability to complete HEP and improvement in symptoms. Patient has met 1/4 long term goals with improvement in activity tolerance. Remaining goals not met due to continued symptoms, impaired ROM, and impaired functional strength. Patient low back pain has improved significantly since beginning therapy but he continues to be limited by RLE symptoms which have yet to improve with interventions. Discussed POC with patient today and we will continue to address core strength with hopes increased stability decreases LE symptoms and improves mobility. Patient states flexion felt good today, trialed repeated flexion and SKTC with slight decrease in LE symptoms while completing. Trialing flexion over next several days to assess response. Patient will continue to benefit from skilled physical therapy in order to reduce impairment and improve function.    Personal Factors and Comorbidities Comorbidity 2;Past/Current Experience;Behavior Pattern    Comorbidities hx back pain, possible Scoliosis    Examination-Activity Limitations Bathing;Bed Mobility;Bend;Carry;Dressing;Lift;Locomotion Level;Sit;Sleep;Squat;Stairs;Stand;Transfers    Examination-Participation Restrictions Church;Cleaning;Community Activity;Driving;Meal Prep;Shop;Volunteer;Yard Work    [employer] officer Evolving/Moderate complexity    Rehab Potential Good    PT Frequency 2x / week    PT Duration 4 weeks    PT Treatment/Interventions ADLs/Self Care Home Management;Aquatic Therapy;Biofeedback;Canalith Repostioning;Cryotherapy;Electrical Stimulation;Iontophoresis 76m/ml Dexamethasone;Moist Heat;Traction;Ultrasound;DME  Instruction;Gait training;Stair training;Functional mobility training;Therapeutic activities;Therapeutic exercise;Balance training;Neuromuscular re-education;Patient/family education;Manual techniques;Dry needling;Energy conservation;Splinting;Compression bandaging;Scar mobilization;Passive range of motion;Spinal Manipulations;Joint Manipulations    PT Next Visit Plan [facility] core strengtheing and progress as able, possibly continue contract relax for QL    PT Home Exercise Plan 5/25 prone on elbows, prone press ups 6/10 hip extension, ab sets; 6/15: hamstring and piriformis stretch 7/22 SKTC    Consulted and Agree with Plan of Care Patient           Patient will benefit from skilled therapeutic intervention in order to improve the following deficits and impairments:  Abnormal gait, Decreased activity tolerance, Decreased balance, Decreased mobility, Decreased range of motion, Hypomobility, Decreased strength, Impaired sensation, Postural dysfunction, Improper body mechanics, Impaired flexibility, Pain  Visit Diagnosis: Low back pain, unspecified back pain laterality, unspecified chronicity, unspecified whether sciatica present  Other symptoms and signs involving the musculoskeletal system  Other abnormalities of gait and mobility  Muscle weakness (generalized)     Problem List Patient Active Problem List   Diagnosis Date Noted  . Hx of colonic polyps 10/26/2018  . H[place] cell [place] 05/03/2011  . IBS (irritable bowel syndrome) 05/03/2011  . BPH (benign prostatic hypertrophy) 05/03/2011  . Abnormal liver function tests 05/03/2011    4:45 PM, 11/08/19 AMearl LatinPT, DPT Physical Therapist  at [place], [place], [address] Phone: [phone]   Fax:  [phone]  Name: Cameron Wu MRN: [id number] Date of Birth:  09/02/1950

## 2019-11-13 ENCOUNTER — Other Ambulatory Visit: Payer: Self-pay

## 2019-11-13 ENCOUNTER — Ambulatory Visit (HOSPITAL_COMMUNITY): Payer: Medicare PPO | Admitting: Physical Therapy

## 2019-11-13 ENCOUNTER — Encounter (HOSPITAL_COMMUNITY): Payer: Self-pay | Admitting: Physical Therapy

## 2019-11-13 DIAGNOSIS — M6281 Muscle weakness (generalized): Secondary | ICD-10-CM | POA: Diagnosis not present

## 2019-11-13 DIAGNOSIS — R29898 Other symptoms and signs involving the musculoskeletal system: Secondary | ICD-10-CM | POA: Diagnosis not present

## 2019-11-13 DIAGNOSIS — M545 Low back pain, unspecified: Secondary | ICD-10-CM

## 2019-11-13 DIAGNOSIS — R2689 Other abnormalities of gait and mobility: Secondary | ICD-10-CM

## 2019-11-13 NOTE — Therapy (Signed)
Cameron Wu, [place], [address] Phone: [phone]   Fax:  [phone]  Physical Therapy Treatment  Patient Details  Name: Cameron Wu MRN: [id number] Date of Birth: Jan 18, 1951 Referring Provider (PT): [name] MD   Encounter Date: 11/13/2019   PT End of Session - 11/13/19 1346    Visit Number 17    Number of Visits 28    Date for PT Re-Evaluation 12/06/19    Authorization Type [employer] (auth via cohere, visits based on med necessity)    Authorization Time Period 12 visits approved 5/24-->10/22/19;  8 visits 6/28-7/26/21; approved 8 visits 7/22-8/19    Authorization - Visit Number 2    Authorization - Number of Visits 8    Progress Note Due on Visit 26    PT Start Time 1347    PT Stop Time 1427    PT Time Calculation (min) 40 min    Activity Tolerance Patient tolerated treatment well;Patient limited by pain    Behavior During Therapy [facility] for tasks assessed/performed           Past Medical History:  Diagnosis Date  . Abnormal liver function tests 05/03/2011  . BPH (benign prostatic hypertrophy) 05/03/2011  . Elevated transaminase level   . GERD (gastroesophageal reflux disease)   . Hairy cell leukemia   . Hairy cell leukemia    1992  . Hairy cell leukemia, in remission ([place]) 05/03/2011  . IBS (irritable bowel syndrome) 05/03/2011  . Irritable bowel syndrome    2 yrs ago, states he had inflammation around anus. Biopsy: inclusive    Past Surgical History:  Procedure Laterality Date  . BONE MARROW BIOPSY     2007  . COLONOSCOPY  08   with polyps  . COLONOSCOPY  09/30/2011   Procedure: COLONOSCOPY;  Surgeon: Rogene Houston, MD;  [facility];  Service: Endoscopy;  Laterality: N/A;  730  . COLONOSCOPY N/A 01/11/2019   Procedure: COLONOSCOPY;  Surgeon: Rogene Houston, MD;  [facility];  Service: Endoscopy;  Laterality: N/A;  100-office move to 9:30am  . HX COLON POLYPS     . TONSILLECTOMY     childhood    There were no vitals filed for this visit.   Subjective Assessment - 11/13/19 1345    Subjective Patient states the [facility] exercise helps but it is short lived. He may get about 10 seconds of relief upon returning to standing/walking. His other exercises have been alright. He woke up and was standing straight but by the time after breakfast he was slumped.    Pertinent History hx back pain, scolosis possibly    Patient Stated Goals get back to normal and get back to working in the yard    Currently in Pain? Yes    Pain Score 5     Pain Location Leg    Pain Onset More than a month ago                             [facility] Adult PT Treatment/Exercise - 11/13/19 0001      Lumbar Exercises: Stretches   Single Knee to Chest Stretch Limitations 2x10 with 3 second holds      Lumbar Exercises: Seated   Other Seated Lumbar Exercises TRA activation 10x 5 second holds      Lumbar Exercises: Supine   Ab Set 20 reps    AB  Set Limitations 5 sec holds, 3 sets    Bent Knee Raise 20 reps;5 seconds    Bent Knee Raise Limitations 2 sets    Other Supine Lumbar Exercises dead bug isometric 10x 10 second holds      Manual Therapy   Manual Therapy Muscle Energy Technique    Manual therapy comments Manual complete separate than rest of tx    Muscle Energy Technique contract relax in sidelying R QL 10 reps; 10" holds then 2x  30" manual stretch                  PT Education - 11/13/19 1346    Education Details Patient educated on HEP, mechanics of exercise    Person(s) Educated Patient    Methods Explanation;Demonstration    Comprehension Verbalized understanding;Returned demonstration            PT Short Term Goals - 10/15/19 1351      PT SHORT TERM GOAL #1   Title Patient will be independent with HEP in order to improve functional outcomes.    Time 3    Period Weeks    Status Achieved    Target Date 10/01/19      PT SHORT TERM  GOAL #2   Title Patient will report at least 25% improvement in symptoms for improved quality of life.    Time 3    Period Weeks    Status Achieved    Target Date 10/01/19             PT Long Term Goals - 10/15/19 1354      PT LONG TERM GOAL #1   Title Patient will report at least 75% improvement in symptoms for improved quality of life.    Time 6    Period Weeks    Status On-going      PT LONG TERM GOAL #2   Title Patient will improve FOTO score by at least 5 points in order to indicate improved tolerance to activity.    Time 6    Period Weeks    Status Achieved      PT LONG TERM GOAL #3   Title Patient will be able to complete 5x STS in under 11.4 seconds in order to reduce the risk of falls.    Time 6    Period Weeks    Status On-going      PT LONG TERM GOAL #4   Title Patient will demonstrate at least 25% improvement in lumbar ROM in all planes for improved ability to move for yard work.    Time 6    Period Weeks    Status On-going                 Plan - 11/13/19 1347    Clinical Impression Statement Patient able to complete [facility] exercise with good mechanics with min/no cueing. Patient tolerates manual therapy well with improvement in symptoms following contract relax exercise. Patient requires extensive tactile and verbal cueing for TRA activation in hooklying position. He is able to complete with marches as well after extensive practice without movement. Patient fatigues quickly with dead bug isometric for core strengthen but is able to complete with good form. Patient states increase in low back symptoms and increase in burning in hamstring and behind knee following session but less symptoms in foot. Patient able to engage core in sitting and while standing. Patient states less symptoms in back hip and knee and increased in foot. Patient  will continue to benefit from skilled physical therapy in order to reduce impairment and improve function.    Personal  Factors and Comorbidities Comorbidity 2;Past/Current Experience;Behavior Pattern    Comorbidities hx back pain, possible Scoliosis    Examination-Activity Limitations Bathing;Bed Mobility;Bend;Carry;Dressing;Lift;Locomotion Level;Sit;Sleep;Squat;Stairs;Stand;Transfers    Examination-Participation Restrictions Church;Cleaning;Community Activity;Driving;Meal Prep;Shop;Volunteer;Yard Work    [employer] officer Evolving/Moderate complexity    Rehab Potential Good    PT Frequency 2x / week    PT Duration 4 weeks    PT Treatment/Interventions ADLs/Self Care Home Management;Aquatic Therapy;Biofeedback;Canalith Repostioning;Cryotherapy;Electrical Stimulation;Iontophoresis 21m/ml Dexamethasone;Moist Heat;Traction;Ultrasound;DME Instruction;Gait training;Stair training;Functional mobility training;Therapeutic activities;Therapeutic exercise;Balance training;Neuromuscular re-education;Patient/family education;Manual techniques;Dry needling;Energy conservation;Splinting;Compression bandaging;Scar mobilization;Passive range of motion;Spinal Manipulations;Joint Manipulations    PT Next Visit [facility] core strengtheing and progress as able, possibly continue contract relax for QL    PT Home Exercise Plan 5/25 prone on elbows, prone press ups 6/10 hip extension, ab sets; 6/15: hamstring and piriformis stretch 7/[facility] ab set mU.S. Bancorpand Agree with Plan of Care Patient           Patient will benefit from skilled therapeutic intervention in order to improve the following deficits and impairments:  Abnormal gait, Decreased activity tolerance, Decreased balance, Decreased mobility, Decreased range of motion, Hypomobility, Decreased strength, Impaired sensation, Postural dysfunction, Improper body mechanics, Impaired flexibility, Pain  Visit Diagnosis: Low back pain, unspecified back pain laterality, unspecified chronicity, unspecified whether sciatica  present  Other symptoms and signs involving the musculoskeletal system  Other abnormalities of gait and mobility  Muscle weakness (generalized)     Problem List Patient Active Problem List   Diagnosis Date Noted  . Hx of colonic polyps 10/26/2018  . Hairy cell [place], in [place] 05/03/2011  . IBS (irritable bowel syndrome) 05/03/2011  . BPH (benign prostatic hypertrophy) 05/03/2011  . Abnormal liver function tests 05/03/2011    2:31 PM, 11/13/19 AMearl LatinPT, DPT Physical Therapist [place] [address]Phone: [phone]  Fax:  [phone] Name: FNEVILLE WALSTONMRN: [id number] of Birth: [birth year]

## 2019-11-13 NOTE — Patient Instructions (Signed)
Access Code: [email or web address] URL: [email or web address] Date: 11/13/2019 Prepared by: Mitzi Hansen Tashonda Pinkus  Exercises Abdominal Bracing - 1 x daily - 7 x weekly - 3 sets - 10 reps - 5 second hold Hooklying Small March - 1 x daily - 7 x weekly - 3 sets - 10 reps

## 2019-11-15 ENCOUNTER — Encounter (HOSPITAL_COMMUNITY): Payer: Medicare PPO | Admitting: Physical Therapy

## 2019-11-15 DIAGNOSIS — M5416 Radiculopathy, lumbar region: Secondary | ICD-10-CM | POA: Diagnosis not present

## 2019-11-15 HISTORY — DX: Radiculopathy, lumbar region: M54.16

## 2019-11-20 ENCOUNTER — Ambulatory Visit (HOSPITAL_COMMUNITY): Payer: Medicare PPO | Attending: Internal Medicine | Admitting: Physical Therapy

## 2019-11-20 ENCOUNTER — Other Ambulatory Visit: Payer: Self-pay

## 2019-11-20 ENCOUNTER — Encounter (HOSPITAL_COMMUNITY): Payer: Self-pay | Admitting: Physical Therapy

## 2019-11-20 DIAGNOSIS — R2689 Other abnormalities of gait and mobility: Secondary | ICD-10-CM | POA: Diagnosis not present

## 2019-11-20 DIAGNOSIS — M6281 Muscle weakness (generalized): Secondary | ICD-10-CM | POA: Insufficient documentation

## 2019-11-20 DIAGNOSIS — M545 Low back pain, unspecified: Secondary | ICD-10-CM

## 2019-11-20 DIAGNOSIS — R29898 Other symptoms and signs involving the musculoskeletal system: Secondary | ICD-10-CM | POA: Diagnosis not present

## 2019-11-20 NOTE — Therapy (Signed)
[place], [place], [address] Phone: [phone]   Fax:  [phone]  Physical Therapy Treatment  Patient Details  Name: Cameron Wu MRN: [id number] Date of Birth: 02-Aug-1950 Referring Provider (PT): [name] MD   Encounter Date: 11/20/2019   PT End of Session - 11/20/19 1346    Visit Number 18    Number of Visits 28    Date for PT Re-Evaluation 12/06/19    Authorization Type [employer] (auth via cohere, visits based on med necessity)    Authorization Time Period 12 visits approved 5/24-->10/22/19;  8 visits 6/28-7/26/21; approved 8 visits 7/22-8/19    Authorization - Visit Number 3    Authorization - Number of Visits 8    Progress Note Due on Visit 26    PT Start Time 1347    PT Stop Time 1430    PT Time Calculation (min) 43 min    Activity Tolerance Patient tolerated treatment well;Patient limited by pain    Behavior During Therapy [facility] for tasks assessed/performed           Past Medical History:  Diagnosis Date  . Abnormal liver function tests 05/03/2011  . BPH (benign prostatic hypertrophy) 05/03/2011  . Elevated transaminase level   . GERD (gastroesophageal reflux disease)   . Hairy cell leukemia   . Hairy cell leukemia    1992  . Hairy cell leukemia, in remission ([place]) 05/03/2011  . IBS (irritable bowel syndrome) 05/03/2011  . Irritable bowel syndrome    2 yrs ago, states he had inflammation around anus. Biopsy: inclusive    Past Surgical History:  Procedure Laterality Date  . BONE MARROW BIOPSY     2007  . COLONOSCOPY  08   with polyps  . COLONOSCOPY  09/30/2011   Procedure: COLONOSCOPY;  Surgeon: Rogene Houston, MD;  [facility];  Service: Endoscopy;  Laterality: N/A;  730  . COLONOSCOPY N/A 01/11/2019   Procedure: COLONOSCOPY;  Surgeon: Rogene Houston, MD;  [facility];  Service: Endoscopy;  Laterality: N/A;  100-office move to 9:30am  . HX COLON POLYPS     . TONSILLECTOMY     childhood    There were no vitals filed for this visit.   Subjective Assessment - 11/20/19 1346    Subjective Patient states his back is still fine. Pain is still behind right knee with hot pocking pain. Foot numbness tingling pain like its falling asleep. Patient states he has been able to complete ab sets but has not noticed much of a change. Patient states he has been feeling better with core strengthen because back is not hurting.    Pertinent History hx back pain, scolosis possibly    Patient Stated Goals get back to normal and get back to working in the yard    Currently in Pain? Yes    Pain Score 5     Pain Location Leg    Pain Onset More than a month ago                             [facility] Adult PT Treatment/Exercise - 11/20/19 0001      Lumbar Exercises: Seated   Other Seated Lumbar Exercises TRA activation 10x 5 second holds    Other Seated Lumbar Exercises seated dead bug isometrics with physioball 10x 5 second      Lumbar Exercises: Supine  Ab Set 20 reps    AB Set Limitations 5 second holds    Bent Knee Raise 20 reps;5 seconds    Bent Knee Raise Limitations 2 sets    Dead Bug 20 reps    Dead Bug Limitations UE only 2 sets    Other Supine Lumbar Exercises dead bug isometric 10x 5 second holds    Other Supine Lumbar Exercises DKTC with LE on ball 10x 5 second holds      Manual Therapy   Manual Therapy Muscle Energy Technique    Manual therapy comments Manual complete separate than rest of tx    Joint Mobilization Lumbar AP - not able to tolerate due to abdominal pain with setting up for moblization so discontinued    Muscle Energy Technique contract relax in sidelying R QL 10 reps; 10" holds then 2x  30" manual stretch                  PT Education - 11/20/19 1346    Education Details Patient educated on HEP, mechanics of exercise    Person(s) Educated Patient    Methods Explanation;Demonstration    Comprehension  Verbalized understanding;Returned demonstration            PT Short Term Goals - 10/15/19 1351      PT SHORT TERM GOAL #1   Title Patient will be independent with HEP in order to improve functional outcomes.    Time 3    Period Weeks    Status Achieved    Target Date 10/01/19      PT SHORT TERM GOAL #2   Title Patient will report at least 25% improvement in symptoms for improved quality of life.    Time 3    Period Weeks    Status Achieved    Target Date 10/01/19             PT Long Term Goals - 10/15/19 1354      PT LONG TERM GOAL #1   Title Patient will report at least 75% improvement in symptoms for improved quality of life.    Time 6    Period Weeks    Status On-going      PT LONG TERM GOAL #2   Title Patient will improve FOTO score by at least 5 points in order to indicate improved tolerance to activity.    Time 6    Period Weeks    Status Achieved      PT LONG TERM GOAL #3   Title Patient will be able to complete 5x STS in under 11.4 seconds in order to reduce the risk of falls.    Time 6    Period Weeks    Status On-going      PT LONG TERM GOAL #4   Title Patient will demonstrate at least 25% improvement in lumbar ROM in all planes for improved ability to move for yard work.    Time 6    Period Weeks    Status On-going                 Plan - 11/20/19 1347    Clinical Impression Statement Patient continues to have LE symptoms which have not changed since focusing on core strengthen. Patient tolerates sidelying QL contract relax exercise with fatigue noted following. Patient demonstrates good TRA activation in hooklying position today. Attempted AP of lumbar spine but it was not tolerated well secondary to abdominal pain with setting up for mobilization  so it was discontinued. Patient fatigues in abdominals with dead bug isometric. Pain decreased on LE but increased in low back following TRA activation exercises but returns upon sitting with less  pain overall in RLE compared to beginning of session. Patient completes ab isometrics in seated with less low back pain but c/o increasing LE symptoms. Patient able to complete dead bug exercise with UE only with good mechanics. Patient will continue to benefit from skilled physical therapy in order to reduce impairment and improve function.    Personal Factors and Comorbidities Comorbidity 2;Past/Current Experience;Behavior Pattern    Comorbidities hx back pain, possible Scoliosis    Examination-Activity Limitations Bathing;Bed Mobility;Bend;Carry;Dressing;Lift;Locomotion Level;Sit;Sleep;Squat;Stairs;Stand;Transfers    Examination-Participation Restrictions Church;Cleaning;Community Activity;Driving;Meal Prep;Shop;Volunteer;Yard Work    [employer]chant [employer] officer Evolving/Moderate complexity    Rehab Potential Good    PT Frequency 2x / week    PT Duration 4 weeks    PT Treatment/Interventions ADLs/Self Care Home Management;Aquatic Therapy;Biofeedback;Canalith Repostioning;Cryotherapy;Electrical Stimulation;Iontophoresis 62m/ml Dexamethasone;Moist Heat;Traction;Ultrasound;DME Instruction;Gait training;Stair training;Functional mobility training;Therapeutic activities;Therapeutic exercise;Balance training;Neuromuscular re-education;Patient/family education;Manual techniques;Dry needling;Energy conservation;Splinting;Compression bandaging;Scar mobilization;Passive range of motion;Spinal Manipulations;Joint Manipulations    PT Next Visit [facility] core strengtheing and progress as able, possibly continue contract relax for QL    PT Home Exercise Plan 5/25 prone on elbows, prone press ups 6/10 hip extension, ab sets; 6/15: hamstring and piriformis stretch 7/[facility]/24ab set marches 8/3 dead bug UE only    Consulted and Agree with Plan of Care Patient           Patient will benefit from skilled therapeutic intervention in order to improve the following deficits  and impairments:  Abnormal gait, Decreased activity tolerance, Decreased balance, Decreased mobility, Decreased range of motion, Hypomobility, Decreased strength, Impaired sensation, Postural dysfunction, Improper body mechanics, Impaired flexibility, Pain  Visit Diagnosis: Low back pain, unspecified back pain laterality, unspecified chronicity, unspecified whether sciatica present  Other symptoms and signs involving the musculoskeletal system  Other abnormalities of gait and mobility  Muscle weakness (generalized)     Problem List Patient Active Problem List   Diagnosis Date Noted  . Hx of colonic polyps 10/26/2018  . [place] 05/03[place]  . IBS (irritable bowel syndrome) 05/03/2011  . BPH (benign prostatic hypertrophy) 05/03/2011  . Abnormal liver function tests 05/03/2011    2:32 PM, 11/20/19 AMearl LatinPT, DPT Physical Therapist [place] [address]Phone: [phone]  Fax:  [phone] Name: FURAL ACREEMRN: [id number] of Birth: 7April 05, 1952

## 2019-11-22 ENCOUNTER — Other Ambulatory Visit: Payer: Self-pay

## 2019-11-22 ENCOUNTER — Encounter (HOSPITAL_COMMUNITY): Payer: Self-pay | Admitting: Physical Therapy

## 2019-11-22 ENCOUNTER — Ambulatory Visit (HOSPITAL_COMMUNITY): Payer: Medicare PPO | Admitting: Physical Therapy

## 2019-11-22 DIAGNOSIS — R29898 Other symptoms and signs involving the musculoskeletal system: Secondary | ICD-10-CM | POA: Diagnosis not present

## 2019-11-22 DIAGNOSIS — M545 Low back pain, unspecified: Secondary | ICD-10-CM

## 2019-11-22 DIAGNOSIS — R2689 Other abnormalities of gait and mobility: Secondary | ICD-10-CM

## 2019-11-22 DIAGNOSIS — M6281 Muscle weakness (generalized): Secondary | ICD-10-CM

## 2019-11-22 NOTE — Therapy (Signed)
[facility] [address] [place], [place], [address] Phone: [phone]   Fax:  [phone]  Physical Therapy Treatment  Patient Details  Name: Cameron Wu MRN: [id number] Date of Birth: 12/07/1950 Referring Provider (PT): Roy Fagan MD   Encounter Date: 11/22/2019   PT End of Session - 11/22/19 1347    Visit Number 19    Number of Visits 28    Date for PT Re-Evaluation 12/06/19    Authorization Type [employer] (auth via cohere, visits based on med necessity)    Authorization Time Period 12 visits approved 5/24-->10/22/19;  8 visits 6/28-7/26/21; approved 8 visits 7/22-8/19    Authorization - Visit Number 4    Authorization - Number of Visits 8    Progress Note Due on Visit 26    PT Start Time 1347    PT Stop Time 1427    PT Time Calculation (min) 40 min    Activity Tolerance Patient tolerated treatment well;Patient limited by pain    Behavior During Therapy WFL for tasks assessed/performed           Past Medical History:  Diagnosis Date  . Abnormal liver function tests 05/03/2011  . BPH (benign prostatic hypertrophy) 05/03/2011  . Elevated transaminase level   . GERD (gastroesophageal reflux disease)   . Hairy cell leukemia   . Hairy cell leukemia    1992  . Hairy cell leukemia, in remission (HCC) 05/03/2011  . IBS (irritable bowel syndrome) 05/03/2011  . Irritable bowel syndrome    2 yrs ago, states he had inflammation around anus. Biopsy: inclusive    Past Surgical History:  Procedure Laterality Date  . BONE MARROW BIOPSY     2007  . COLONOSCOPY  08   with polyps  . COLONOSCOPY  09/30/2011   Procedure: COLONOSCOPY;  Surgeon: Najeeb U Rehman, MD;  [facility];  Service: Endoscopy;  Laterality: N/A;  730  . COLONOSCOPY N/A 01/11/2019   Procedure: COLONOSCOPY;  Surgeon: Rehman, Najeeb U, MD;  [facility];  Service: Endoscopy;  Laterality: N/A;  100-office move to 9:30am  . HX COLON POLYPS     . TONSILLECTOMY     childhood    There were no vitals filed for this visit.   Subjective Assessment - 11/22/19 1347    Subjective Patient states he is stiff when he first gets up and then he feels better with a hot shower. He then starts having pain at about 11 am after reading the paper and eating breakfast.    Pertinent History hx back pain, scolosis possibly    Patient Stated Goals get back to normal and get back to working in the yard    Currently in Pain? Yes    Pain Score 8     Pain Location Leg    Pain Orientation Right    Pain Onset More than a month ago                             [facility] Adult PT Treatment/Exercise - 11/22/19 0001      Lumbar Exercises: Stretches   Other Lumbar Stretch Exercise thomas hip flexor stretch 4x 1 minute with ab sets      Lumbar Exercises: Supine   Ab Set 20 reps    AB Set Limitations 5 second holds    Dead Bug 20 reps    Other Supine Lumbar Exercises dead bug   isometric 10x 5 second holds 2 sets    Other Supine Lumbar Exercises DKTC with LE on ball 10x 5 second holds      Lumbar Exercises: Quadruped   Other Quadruped Lumbar Exercises quadruped hip extension with ab set 2x 10      Manual Therapy   Manual Therapy Muscle Energy Technique    Manual therapy comments Manual complete separate than rest of tx    Muscle Energy Technique contract relax in sidelying R QL 10 reps; 10" holds then 2x  30" manual stretch                  PT Education - 11/22/19 1347    Education Details Patient educated on HEP, mechanics of exercise    Person(s) Educated Patient    Methods Explanation;Demonstration    Comprehension Verbalized understanding;Returned demonstration            PT Short Term Goals - 10/15/19 1351      PT SHORT TERM GOAL #1   Title Patient will be independent with HEP in order to improve functional outcomes.    Time 3    Period Weeks    Status Achieved    Target Date 10/01/19      PT SHORT TERM GOAL  #2   Title Patient will report at least 25% improvement in symptoms for improved quality of life.    Time 3    Period Weeks    Status Achieved    Target Date 10/01/19             PT Long Term Goals - 10/15/19 1354      PT LONG TERM GOAL #1   Title Patient will report at least 75% improvement in symptoms for improved quality of life.    Time 6    Period Weeks    Status On-going      PT LONG TERM GOAL #2   Title Patient will improve FOTO score by at least 5 points in order to indicate improved tolerance to activity.    Time 6    Period Weeks    Status Achieved      PT LONG TERM GOAL #3   Title Patient will be able to complete 5x STS in under 11.4 seconds in order to reduce the risk of falls.    Time 6    Period Weeks    Status On-going      PT LONG TERM GOAL #4   Title Patient will demonstrate at least 25% improvement in lumbar ROM in all planes for improved ability to move for yard work.    Time 6    Period Weeks    Status On-going                 Plan - 11/22/19 1347    Clinical Impression Statement Patient continues to ambulate with trunk and LE flexed R>L. Patient tolerates Thomas hip flexor stretch well and is given assistance to get into/out of position. Patient states slight improvement following hip flexor stretch. Patient able to complete dead bug isometrics with minimal cueing for mechanics. Patient able to progress from UE only dead bug to full dead bug exercise with good mechanics but fatigues quickly. Patient able to complete quadruped exercise without increase in symptoms as well today. Patient will continue to benefit from skilled physical therapy in order to reduce impairment and improve function.    Personal Factors and Comorbidities Comorbidity 2;Past/Current Experience;Behavior Pattern    Comorbidities  hx back pain, possible Scoliosis    Examination-Activity Limitations Bathing;Bed Mobility;Bend;Carry;Dressing;Lift;Locomotion  Level;Sit;Sleep;Squat;Stairs;Stand;Transfers    Examination-Participation Restrictions Church;Cleaning;Community Activity;Driving;Meal Prep;Shop;Volunteer;Yard Work    [employer] Evolving/Moderate complexity    Rehab Potential Good    PT Frequency 2x / week    PT Duration 4 weeks    PT Treatment/Interventions ADLs/Self Care Home Management;Aquatic Therapy;Biofeedback;Canalith Repostioning;Cryotherapy;Electrical Stimulation;Iontophoresis 22m/ml Dexamethasone;Moist Heat;Traction;Ultrasound;DME Instruction;Gait training;Stair training;Functional mobility training;Therapeutic activities;Therapeutic exercise;Balance training;Neuromuscular re-education;Patient/family education;Manual techniques;Dry needling;Energy conservation;Splinting;Compression bandaging;Scar mobilization;Passive range of motion;Spinal Manipulations;Joint Manipulations    PT Next Visit Plan [facility] continue core strengtheing and progress as able, possibly continue contract relax for QL    PT Home Exercise Plan 5/25 prone on elbows, prone press ups 6/10 hip extension, ab sets; 6/15: hamstring and piriformis stretch 7/22 [facility]7August 18, 2024ab set marches 8/3 dead bug UE only 82024-08-27thomas stretch, quadruped hip ext    Consulted and Agree with Plan of Care Patient           Patient will benefit from skilled therapeutic intervention in order to improve the following deficits and impairments:  Abnormal gait, Decreased activity tolerance, Decreased balance, Decreased mobility, Decreased range of motion, Hypomobility, Decreased strength, Impaired sensation, Postural dysfunction, Improper body mechanics, Impaired flexibility, Pain  Visit Diagnosis: Low back pain, unspecified back pain laterality, unspecified chronicity, unspecified whether sciatica present  Other symptoms and signs involving the musculoskeletal system  Other abnormalities of gait and mobility  Muscle weakness (generalized)     Problem  List Patient Active Problem List   Diagnosis Date Noted  . Hx of colonic polyps 10/26/2018  . Hairy cell [place], in [place] ([facility] 05/03/2011  . IBS (irritable bowel syndrome) 05/03/2011  . BPH (benign prostatic hypertrophy) 05/03/2011  . Abnormal liver function tests 05/03/2011    2:31 PM, 02021-08-27AMearl LatinPT, DPT Physical Therapist at [facility] [address]Phone: [phone]  Fax:  [phone] Name: Cameron BOGUSMRN: [id number] of Birth: [birth year]

## 2019-11-28 ENCOUNTER — Ambulatory Visit (HOSPITAL_COMMUNITY): Payer: Medicare PPO | Admitting: Physical Therapy

## 2019-11-28 ENCOUNTER — Other Ambulatory Visit: Payer: Self-pay

## 2019-11-28 ENCOUNTER — Encounter (HOSPITAL_COMMUNITY): Payer: Self-pay | Admitting: Physical Therapy

## 2019-11-28 DIAGNOSIS — M6281 Muscle weakness (generalized): Secondary | ICD-10-CM

## 2019-11-28 DIAGNOSIS — R2689 Other abnormalities of gait and mobility: Secondary | ICD-10-CM

## 2019-11-28 DIAGNOSIS — R29898 Other symptoms and signs involving the musculoskeletal system: Secondary | ICD-10-CM | POA: Diagnosis not present

## 2019-11-28 DIAGNOSIS — M545 Low back pain, unspecified: Secondary | ICD-10-CM

## 2019-11-28 NOTE — Therapy (Signed)
[place], [place], [address] Phone: [phone]   Fax:  [phone]  Physical Therapy Treatment  Patient Details  Name: Cameron Wu MRN: [id number] Date of Birth: September 30, 1950 Referring Provider (PT): [name] MD   Encounter Date: 11/28/2019   PT End of Session - 11/28/19 1302    Visit Number 20    Number of Visits 28    Date for PT Re-Evaluation 12/06/19    Authorization Type [employer] (auth via cohere, visits based on med necessity)    Authorization Time Period 12 visits approved 5/24-->10/22/19;  8 visits 6/28-7/26/21; approved 8 visits 7/22-8/19    Authorization - Visit Number 5    Authorization - Number of Visits 8    Progress Note Due on Visit 26    PT Start Time 1302    PT Stop Time 1342    PT Time Calculation (min) 40 min    Activity Tolerance Patient tolerated treatment well;Patient limited by pain    Behavior During Therapy [facility] for tasks assessed/performed           Past Medical History:  Diagnosis Date  . Abnormal liver function tests 05/03/2011  . BPH (benign prostatic hypertrophy) 05/03/2011  . Elevated transaminase level   . GERD (gastroesophageal reflux disease)   . Hairy cell leukemia   . Hairy cell leukemia    1992  . Hairy cell leukemia, in remission ([place]) 05/03/2011  . IBS (irritable bowel syndrome) 05/03/2011  . Irritable bowel syndrome    2 yrs ago, states he had inflammation around anus. Biopsy: inclusive    Past Surgical History:  Procedure Laterality Date  . BONE MARROW BIOPSY     2007  . COLONOSCOPY  08   with polyps  . COLONOSCOPY  09/30/2011   Procedure: COLONOSCOPY;  Surgeon: Rogene Houston, MD;  [facility];  Service: Endoscopy;  Laterality: N/A;  730  . COLONOSCOPY N/A 01/11/2019   Procedure: COLONOSCOPY;  Surgeon: Rogene Houston, MD;  [facility];  Service: Endoscopy;  Laterality: N/A;  100-office move to 9:30am  . HX COLON POLYPS     . TONSILLECTOMY     childhood    There were no vitals filed for this visit.   Subjective Assessment - 11/28/19 1301    Subjective Patient states his back did not hurt this weekend. Symptoms seem to be localized to R leg. Low back has been pretty normal. Patient states his home exercises are going well.    Pertinent History hx back pain, scolosis possibly    Patient Stated Goals get back to normal and get back to working in the yard    Currently in Pain? Yes    Pain Score 5     Pain Location Leg    Pain Orientation Right    Pain Onset More than a month ago                             [facility] Adult PT Treatment/Exercise - 11/28/19 0001      Lumbar Exercises: Supine   Ab Set 20 reps    AB Set Limitations 5 second holds    Dead Bug 20 reps    Other Supine Lumbar Exercises dead bug isometric 10x 5 second holds 2 sets; dead bug isometrics with alternating UE/LE off ball 2x10     Other Supine Lumbar Exercises DKTC with LE on  ball 10x 5 second holds and 1x20      Lumbar Exercises: Quadruped   Plank 10x 10 second holds on knees    Other Quadruped Lumbar Exercises quadruped hip extension with ab set 2x 10                  PT Education - 11/28/19 1301    Education Details Patient educated on HEP, mechanics of exercise, discussed POC    Person(s) Educated Patient    Methods Explanation;Demonstration    Comprehension Verbalized understanding;Returned demonstration            PT Short Term Goals - 10/15/19 1351      PT SHORT TERM GOAL #1   Title Patient will be independent with HEP in order to improve functional outcomes.    Time 3    Period Weeks    Status Achieved    Target Date 10/01/19      PT SHORT TERM GOAL #2   Title Patient will report at least 25% improvement in symptoms for improved quality of life.    Time 3    Period Weeks    Status Achieved    Target Date 10/01/19             PT Long Term Goals - 10/15/19 1354      PT LONG  TERM GOAL #1   Title Patient will report at least 75% improvement in symptoms for improved quality of life.    Time 6    Period Weeks    Status On-going      PT LONG TERM GOAL #2   Title Patient will improve FOTO score by at least 5 points in order to indicate improved tolerance to activity.    Time 6    Period Weeks    Status Achieved      PT LONG TERM GOAL #3   Title Patient will be able to complete 5x STS in under 11.4 seconds in order to reduce the risk of falls.    Time 6    Period Weeks    Status On-going      PT LONG TERM GOAL #4   Title Patient will demonstrate at least 25% improvement in lumbar ROM in all planes for improved ability to move for yard work.    Time 6    Period Weeks    Status On-going                 Plan - 11/28/19 1302    Clinical Impression Statement Discussed POC with patient and patient agrees to possible d/c next session and continue to work on HEP while awaiting MD consult. Patient able to complete previously performed exercises with min verbal cueing for mechanics and hold times. Patient fatigues quickly with dead bug isometric with alternating UE/LE movements and c/o low back symptoms. Patient educated on taking a break with fatigue to reengage abdominals. Patient able to complete plank on knees and fatigues quickly. Patient will continue to benefit from skilled physical therapy in order to reduce impairment and improve function.    Personal Factors and Comorbidities Comorbidity 2;Past/Current Experience;Behavior Pattern    Comorbidities hx back pain, possible Scoliosis    Examination-Activity Limitations Bathing;Bed Mobility;Bend;Carry;Dressing;Lift;Locomotion Level;Sit;Sleep;Squat;Stairs;Stand;Transfers    Examination-Participation Restrictions Church;Cleaning;Community Activity;Driving;Meal Prep;Shop;Volunteer;Yard Work    Stability/Clinical Decision Making Evolving/Moderate complexity    Rehab Potential Good    PT Frequency 2x / week     PT Duration 4 weeks    PT Treatment/Interventions  ADLs/Self Care Home Management;Aquatic Therapy;Biofeedback;Canalith Repostioning;Cryotherapy;Electrical Stimulation;Iontophoresis 67m/ml Dexamethasone;Moist Heat;Traction;Ultrasound;DME Instruction;Gait training;Stair training;Functional mobility training;Therapeutic activities;Therapeutic exercise;Balance training;Neuromuscular re-education;Patient/family education;Manual techniques;Dry needling;Energy conservation;Splinting;Compression bandaging;Scar mobilization;Passive range of motion;Spinal Manipulations;Joint Manipulations    PT Next Visit Plan Reassess with probable D/c, wait for MD appointment and see if patient will return for continued therapy.  continue core strengtheing and progress as able, possibly continue contract relax for QL    PT Home Exercise Plan 5/25 prone on elbows, prone press ups 6/10 hip extension, ab sets; 6/15: hamstring and piriformis stretch 7/22 [facility]720[facility]15ab set marches 82024-08-22dead bug UE only 808-24-2024thomas stretch, quadruped hip ext 808/30/2021plank, dead bug with isometric    Consulted and Agree with Plan of Care Patient           Patient will benefit from skilled therapeutic intervention in order to improve the following deficits and impairments:  Abnormal gait, Decreased activity tolerance, Decreased balance, Decreased mobility, Decreased range of motion, Hypomobility, Decreased strength, Impaired sensation, Postural dysfunction, Improper body mechanics, Impaired flexibility, Pain  Visit Diagnosis: Low back pain, unspecified back pain laterality, unspecified chronicity, unspecified whether sciatica present  Other symptoms and signs involving the musculoskeletal system  Other abnormalities of gait and mobility  Muscle weakness (generalized)     Problem List Patient Active Problem List   Diagnosis Date Noted  . Hx of colonic polyps 10/26/2018  . Hairy cell leukemia, in remission ([facility] [place] 05/03/2011  . IBS (irritable  bowel syndrome) 05/03/2011  . BPH (benign prostatic hypertrophy) 05/03/2011  . Abnormal liver function tests 05/03/2011   1:46 PM, 008/30/21AMearl LatinPT, DPT Physical Therapist at [place] [id number]: [phone]  Fax:  [phone] Name: FLAMOUNT BANKSONMRN: [id number] of Birth: [birth year]

## 2019-11-28 NOTE — Patient Instructions (Signed)
Access Code: [email or web address] URL: [email or web address] Date: 11/28/2019 Prepared by: Mitzi Hansen Kyri Dai  Exercises [employer] with [employer] - 1 x daily - 7 x weekly - 2 sets - 10 reps Plank on Knees - 1 x daily - 7 x weekly - 10 reps - 10 second hold

## 2019-12-02 ENCOUNTER — Encounter: Payer: Self-pay | Admitting: Hematology

## 2019-12-06 ENCOUNTER — Telehealth (HOSPITAL_COMMUNITY): Payer: Self-pay | Admitting: Physical Therapy

## 2019-12-06 ENCOUNTER — Other Ambulatory Visit: Payer: Self-pay

## 2019-12-06 ENCOUNTER — Ambulatory Visit (HOSPITAL_COMMUNITY): Payer: Medicare PPO | Admitting: Physical Therapy

## 2019-12-06 ENCOUNTER — Encounter (HOSPITAL_COMMUNITY): Payer: Self-pay | Admitting: Physical Therapy

## 2019-12-06 DIAGNOSIS — R2689 Other abnormalities of gait and mobility: Secondary | ICD-10-CM

## 2019-12-06 DIAGNOSIS — M545 Low back pain, unspecified: Secondary | ICD-10-CM

## 2019-12-06 DIAGNOSIS — M6281 Muscle weakness (generalized): Secondary | ICD-10-CM | POA: Diagnosis not present

## 2019-12-06 DIAGNOSIS — R29898 Other symptoms and signs involving the musculoskeletal system: Secondary | ICD-10-CM | POA: Diagnosis not present

## 2019-12-06 NOTE — Therapy (Signed)
[place], [place], [address] Phone: [phone]   Fax:  [phone]  Physical Therapy Treatment  Patient Details  Name: Cameron Wu MRN: [id number] Date of Birth: 1951/02/27 Referring Provider (PT): [name] MD   Encounter Date: 12/06/2019   PT End of Session - 12/06/19 1253    Visit Number 21    Number of Visits 28    Date for PT Re-Evaluation 12/06/19    Authorization Type [employer] (auth via cohere, visits based on med necessity)    Authorization Time Period 12 visits approved 5/24-->10/22/19;  8 visits 6/28-7/26/21; approved 8 visits 7/22-8/19    Authorization - Visit Number 6    Authorization - Number of Visits 8    Progress Note Due on Visit 26    PT Start Time 1255    PT Stop Time 1335    PT Time Calculation (min) 40 min    Activity Tolerance Patient tolerated treatment well;Patient limited by pain    Behavior During Therapy [facility] for tasks assessed/performed           Past Medical History:  Diagnosis Date  . Abnormal liver function tests 05/03/2011  . BPH (benign prostatic hypertrophy) 05/03/2011  . Elevated transaminase level   . GERD (gastroesophageal reflux disease)   . Hairy cell leukemia   . Hairy cell leukemia    1992  . Hairy cell leukemia, in remission ([place]) 05/03/2011  . IBS (irritable bowel syndrome) 05/03/2011  . Irritable bowel syndrome    2 yrs ago, states he had inflammation around anus. Biopsy: inclusive    Past Surgical History:  Procedure Laterality Date  . BONE MARROW BIOPSY     2007  . COLONOSCOPY  08   with polyps  . COLONOSCOPY  09/30/2011   Procedure: COLONOSCOPY;  Surgeon: Rogene Houston, MD;  [facility];  Service: Endoscopy;  Laterality: N/A;  730  . COLONOSCOPY N/A 01/11/2019   Procedure: COLONOSCOPY;  Surgeon: Rogene Houston, MD;  [facility];  Service: Endoscopy;  Laterality: N/A;  100-office move to 9:30am  . HX COLON POLYPS     . TONSILLECTOMY     childhood    There were no vitals filed for this visit.   Subjective Assessment - 12/06/19 1253    Subjective Patient did not have ball and has been able to use pillows. Core exercises have caused back and hip to start hurting. He has been compliant with HEP. Patient states 70% improvement with physical therapy intervention. He does feel like his posture and function has improved. He remains limited by pain and RLE symptoms.    Pertinent History hx back pain, scolosis possibly    Patient Stated Goals get back to normal and get back to working in the yard    Currently in Pain? Yes    Pain Score 5     Pain Onset More than a month ago              [facility] PT Assessment - 12/06/19 0001      Assessment   Medical Diagnosis R Lumbar Racidulopathy    Referring Provider (PT) [name] MD    Onset Date/Surgical Date 08/20/19    Next MD Visit No follow up scheduled    Prior Therapy None      Precautions   Precautions None      Observation/Other Assessments   Observations Ambulates without AD, limited R knee flexion  extension ROM during gait    Focus on Therapeutic Outcomes (FOTO)  53% limited      AROM   Lumbar Flexion 0% limited    Lumbar Extension 50% limited    Lumbar - Right Side Bend 50% limited     Lumbar - Left Side Bend 0% limited     Lumbar - Right Rotation 0% limited    Lumbar - Left Rotation 0 % limited                         [facility] Adult PT Treatment/Exercise - 12/06/19 0001      Lumbar Exercises: Quadruped   Other Quadruped Lumbar Exercises quadruped hip extension with ab set 2x 10                  PT Education - 12/06/19 1253    Education Details Patient educated on HEP, mechanics of exercise, discussed POC and putting patient on hold, reviewed HEP, proper mechanics, importance of core strength, increasing activity and ADL as able, follow up following MD appointment    Person(s) Educated Patient    Methods  Explanation;Demonstration;Tactile cues;Verbal cues    Comprehension Verbalized understanding;Returned demonstration            PT Short Term Goals - 10/15/19 1351      PT SHORT TERM GOAL #1   Title Patient will be independent with HEP in order to improve functional outcomes.    Time 3    Period Weeks    Status Achieved    Target Date 10/01/19      PT SHORT TERM GOAL #2   Title Patient will report at least 25% improvement in symptoms for improved quality of life.    Time 3    Period Weeks    Status Achieved    Target Date 10/01/19             PT Long Term Goals - 12/06/19 1327      PT LONG TERM GOAL #1   Title Patient will report at least 75% improvement in symptoms for improved quality of life.    Time 6    Period Weeks    Status On-going      PT LONG TERM GOAL #2   Title Patient will improve FOTO score by at least 5 points in order to indicate improved tolerance to activity.    Time 6    Period Weeks    Status Achieved      PT LONG TERM GOAL #3   Title Patient will be able to complete 5x STS in under 11.4 seconds in order to reduce the risk of falls.    Time 6    Period Weeks    Status On-going      PT LONG TERM GOAL #4   Title Patient will demonstrate at least 25% improvement in lumbar ROM in all planes for improved ability to move for yard work.    Time 6    Period Weeks    Status On-going                 Plan - 12/06/19 1254    Clinical Impression Statement Patient has met 2/2 short term goals with ability to complete HEP and improved symptoms. Patient has met 1/4 long term goals with improved function as shown by improvement in FOTO score. Patient remains limited by LE symptoms, decreased lumbar ROM, and impaired functional strength. Discussed patient's symptoms, HEP,  exercise mechanics, increasing activity, and returning to PT after MD appointment next month. Patient shown proper mechanics for HEP exercise that was causing increased symptoms.  Patient will continue to benefit from skilled physical therapy in order to reduce impairment and improve function.    Personal Factors and Comorbidities Comorbidity 2;Past/Current Experience;Behavior Pattern    Comorbidities hx back pain, possible Scoliosis    Examination-Activity Limitations Bathing;Bed Mobility;Bend;Carry;Dressing;Lift;Locomotion Level;Sit;Sleep;Squat;Stairs;Stand;Transfers    Examination-Participation Restrictions Church;Cleaning;Community Activity;Driving;Meal Prep;Shop;Volunteer;Yard Work    [employer] Evolving/Moderate complexity    Rehab Potential Good    PT Frequency 2x / week    PT Duration 4 weeks    PT Treatment/Interventions ADLs/Self Care Home Management;Aquatic Therapy;Biofeedback;Canalith Repostioning;Cryotherapy;Electrical Stimulation;Iontophoresis 25m/ml Dexamethasone;Moist Heat;Traction;Ultrasound;DME Instruction;Gait training;Stair training;Functional mobility training;Therapeutic activities;Therapeutic exercise;Balance training;Neuromuscular re-education;Patient/family education;Manual techniques;Dry needling;Energy conservation;Splinting;Compression bandaging;Scar mobilization;Passive range of motion;Spinal Manipulations;Joint Manipulations    PT Next Visit Plan wait for MD appointment and see if patient will return for continued therapy and put on hold until then  continue core strengtheing and progress as able, possibly continue contract relax for QL    PT Home Exercise Plan 5/25 prone on elbows, prone press ups 6/10 hip extension, ab sets; 6/15: hamstring and piriformis stretch [facility]ab set marches 8/3 dead bug UE only 808-14-2024thomas stretch, quadruped hip ext 808/20/2021plank, dead bug with isometric    Consulted and Agree with Plan of Care Patient           Patient will benefit from skilled therapeutic intervention in order to improve the following deficits and impairments:  Abnormal gait, Decreased activity tolerance, Decreased  balance, Decreased mobility, Decreased range of motion, Hypomobility, Decreased strength, Impaired sensation, Postural dysfunction, Improper body mechanics, Impaired flexibility, Pain  Visit Diagnosis: Low back pain, unspecified back pain laterality, unspecified chronicity, unspecified whether sciatica present  Other symptoms and signs involving the musculoskeletal system  Other abnormalities of gait and mobility  Muscle weakness (generalized)     Problem List Patient Active Problem List   Diagnosis Date Noted  . Hx of colonic polyps 10/26/2018  . [place] 05/03/2011  . IBS (irritable bowel syndrome) 05/03/2011  . BPH (benign prostatic hypertrophy) 05/03/2011  . Abnormal liver function tests 05/03/2011   1:46 PM, 12/06/19 AMearl LatinPT, DPT Physical Therapist [place] [facility] [address]Phone: [phone]  Fax:  [phone] Name: FOWENN ROTHERMELMRN: [id number] of Birth: [birth year]

## 2019-12-06 NOTE — Telephone Encounter (Signed)
Pt will get a second opinion and call us back to let us know if he wants to be D/c or continue. MD apptment 12/31/19

## 2019-12-10 ENCOUNTER — Encounter (HOSPITAL_COMMUNITY): Payer: Medicare PPO | Admitting: Physical Therapy

## 2019-12-12 ENCOUNTER — Encounter (HOSPITAL_COMMUNITY): Payer: Medicare PPO | Admitting: Physical Therapy

## 2019-12-13 ENCOUNTER — Telehealth: Payer: Self-pay | Admitting: Urology

## 2019-12-13 NOTE — Telephone Encounter (Signed)
Pt states he needs refills on tamsulosin and finasteride before his 01/22/2020 appointment. He uses [facility].

## 2019-12-17 ENCOUNTER — Encounter (HOSPITAL_COMMUNITY): Payer: Medicare PPO | Admitting: Physical Therapy

## 2019-12-19 ENCOUNTER — Encounter (HOSPITAL_COMMUNITY): Payer: Medicare PPO | Admitting: Physical Therapy

## 2019-12-20 ENCOUNTER — Other Ambulatory Visit: Payer: Self-pay

## 2019-12-20 MED ORDER — TAMSULOSIN HCL 0.4 MG PO CAPS: 0.4000 mg | ORAL_CAPSULE | Freq: Every day | ORAL | 5 refills | Status: DC

## 2019-12-20 NOTE — Telephone Encounter (Signed)
Refilled submitted 

## 2019-12-31 DIAGNOSIS — M48061 Spinal stenosis, lumbar region without neurogenic claudication: Secondary | ICD-10-CM | POA: Diagnosis not present

## 2019-12-31 DIAGNOSIS — R030 Elevated blood-pressure reading, without diagnosis of hypertension: Secondary | ICD-10-CM | POA: Diagnosis not present

## 2019-12-31 DIAGNOSIS — M5416 Radiculopathy, lumbar region: Secondary | ICD-10-CM | POA: Diagnosis not present

## 2019-12-31 HISTORY — DX: Spinal stenosis, lumbar region without neurogenic claudication: M48.061

## 2020-01-07 DIAGNOSIS — H35351 Cystoid macular degeneration, right eye: Secondary | ICD-10-CM | POA: Diagnosis not present

## 2020-01-07 DIAGNOSIS — H25811 Combined forms of age-related cataract, right eye: Secondary | ICD-10-CM | POA: Diagnosis not present

## 2020-01-07 DIAGNOSIS — H401131 Primary open-angle glaucoma, bilateral, mild stage: Secondary | ICD-10-CM | POA: Diagnosis not present

## 2020-01-09 DIAGNOSIS — R030 Elevated blood-pressure reading, without diagnosis of hypertension: Secondary | ICD-10-CM | POA: Diagnosis not present

## 2020-01-09 DIAGNOSIS — M5416 Radiculopathy, lumbar region: Secondary | ICD-10-CM | POA: Diagnosis not present

## 2020-01-09 DIAGNOSIS — M48061 Spinal stenosis, lumbar region without neurogenic claudication: Secondary | ICD-10-CM | POA: Diagnosis not present

## 2020-01-17 DIAGNOSIS — R7301 Impaired fasting glucose: Secondary | ICD-10-CM | POA: Diagnosis not present

## 2020-01-17 DIAGNOSIS — Z79899 Other long term (current) drug therapy: Secondary | ICD-10-CM | POA: Diagnosis not present

## 2020-01-17 DIAGNOSIS — N400 Enlarged prostate without lower urinary tract symptoms: Secondary | ICD-10-CM | POA: Diagnosis not present

## 2020-01-17 DIAGNOSIS — C9141 Hairy cell leukemia, in remission: Secondary | ICD-10-CM | POA: Diagnosis not present

## 2020-01-22 ENCOUNTER — Ambulatory Visit (INDEPENDENT_AMBULATORY_CARE_PROVIDER_SITE_OTHER): Payer: Medicare PPO | Admitting: Urology

## 2020-01-22 ENCOUNTER — Encounter: Payer: Self-pay | Admitting: Urology

## 2020-01-22 ENCOUNTER — Other Ambulatory Visit: Payer: Self-pay

## 2020-01-22 VITALS — BP 137/81 | HR 81 | Temp 98.6°F | Ht 72.0 in | Wt 166.0 lb

## 2020-01-22 DIAGNOSIS — N401 Enlarged prostate with lower urinary tract symptoms: Secondary | ICD-10-CM

## 2020-01-22 DIAGNOSIS — R351 Nocturia: Secondary | ICD-10-CM

## 2020-01-22 DIAGNOSIS — R972 Elevated prostate specific antigen [PSA]: Secondary | ICD-10-CM | POA: Diagnosis not present

## 2020-01-22 DIAGNOSIS — N138 Other obstructive and reflux uropathy: Secondary | ICD-10-CM

## 2020-01-22 LAB — URINALYSIS, ROUTINE W REFLEX MICROSCOPIC
Bilirubin, UA: NEGATIVE
Glucose, UA: NEGATIVE
Ketones, UA: NEGATIVE
Leukocytes,UA: NEGATIVE
Nitrite, UA: NEGATIVE
Protein,UA: NEGATIVE
RBC, UA: NEGATIVE
Specific Gravity, UA: 1.025 (ref 1.005–1.030)
Urobilinogen, Ur: 0.2 mg/dL (ref 0.2–1.0)
pH, UA: 5.5 (ref 5.0–7.5)

## 2020-01-22 MED ORDER — FINASTERIDE 5 MG PO TABS: 5.0000 mg | ORAL_TABLET | Freq: Every morning | ORAL | 3 refills | Status: DC

## 2020-01-22 MED ORDER — TAMSULOSIN HCL 0.4 MG PO CAPS: 0.4000 mg | ORAL_CAPSULE | Freq: Every day | ORAL | 3 refills | Status: DC

## 2020-01-22 NOTE — Progress Notes (Signed)
Urological Symptom Review  Patient is experiencing the following symptoms: Get up at night to urinate Erection problems (male only)   Review of Systems  Gastrointestinal (upper)  : Negative for upper GI symptoms  Gastrointestinal (lower) : Constipation  Constitutional : Weight loss  Skin: Negative for skin symptoms  Eyes: Negative for eye symptoms  Ear/Nose/Throat : Sinus problems  Hematologic/Lymphatic: Easy bruising  Cardiovascular : Negative for cardiovascular symptoms  Respiratory : Negative for respiratory symptoms  Endocrine: Negative for endocrine symptoms  Musculoskeletal: Back pain  Neurological: Negative for neurological symptoms  Psychologic: Negative for psychiatric symptoms

## 2020-01-22 NOTE — Progress Notes (Signed)
H&P  Chief Complaint: Elevated PSA  History of Present Illness:  10.5.2021: Pt denies any recent UTIs. His most recent UA shows microscopic hematuria. Pt continues on finasteride and reports that his LUTS are stable.  PSA not drawn @ appt w/ Dr Willey Blade.   IPSS Questionnaire (AUA-7): Over the past month.   1)  How often have you had a sensation of not emptying your bladder completely after you finish urinating?  1 - Less than 1 time in 5  2)  How often have you had to urinate again less than two hours after you finished urinating? 1 - Less than 1 time in 5  3)  How often have you found you stopped and started again several times when you urinated?  2 - Less than half the time  4) How difficult have you found it to postpone urination?  1 - Less than 1 time in 5  5) How often have you had a weak urinary stream?  1 - Less than 1 time in 5  6) How often have you had to push or strain to begin urination?  0 - Not at all  7) How many times did you most typically get up to urinate from the time you went to bed until the time you got up in the morning?  1 - 1 time  Total score:  0-7 mildly symptomatic   8-19 moderately symptomatic   20-35 severely symptomatic  QOL score: 1   (below copied from [facility] records):  7.16.2019: He is maintained on both finasteride and tamsulosin both once a day. Urinary symptoms have been stable over the past year.  IPSS 7, QOL score 1   7.28.2020: Cameron Wu is a 68 year-old male established patient who is here for follow up for further evaluation of his elevated PSA.  PSA: His last PSA was performed 11/01/2017. The last PSA value was 1.6. The patient states he does take 5 alpha reductase inhibitor medication.   Originally sent by Dr. [name] in November, 2013 for evaluation and management of an elevated PSA as well as BPH. The patient had previously been followed by Dr. Maryland Pink for several years. According to his last note from February of 2013, the patient's  PSA has averaged around 5 with the patient wanting to be observed for the PSA elevation rather than have a biopsy. According to the patient, his PSA has ranged between 4 and 7 in the past.   He is currently taking tamsulosin and proscar. He is not on new medications for symptoms of prostate enlargement.   BPH: Here today for one year follow-up. He continues on finasteride and tamsulosin. He remains satisfied with his urination and overall health. He does report however that he was considering Urolift but has delayed these plans due to concerns with the pandemic. He does report that he has dizziness with standing, low libido, and is no unable to achieve erections -- he tolerates these well and is not currently sexually active.   Past Medical History:  Diagnosis Date  . Abnormal liver function tests 05/03/2011  . BPH (benign prostatic hypertrophy) 05/03/2011  . Elevated transaminase level   . GERD (gastroesophageal reflux disease)   . Hairy cell leukemia   . Hairy cell leukemia    1992  . Hairy cell leukemia, in remission ([place]) 05/03/2011  . IBS (irritable bowel syndrome) 05/03/2011  . Irritable bowel syndrome    2 yrs ago, states he had inflammation around anus. Biopsy: inclusive  Past Surgical History:  Procedure Laterality Date  . BONE MARROW BIOPSY     2007  . COLONOSCOPY  08   with polyps  . COLONOSCOPY  09/30/2011   Procedure: COLONOSCOPY;  Surgeon: Rogene Houston, MD;  [facility];  Service: Endoscopy;  Laterality: N/A;  730  . COLONOSCOPY N/A 01/11/2019   Procedure: COLONOSCOPY;  Surgeon: Rogene Houston, MD;  [facility];  Service: Endoscopy;  Laterality: N/A;  100-office move to 9:30am  . HX COLON POLYPS    . TONSILLECTOMY     childhood    Home Medications:  Allergies as of 01/22/2020      Reactions   Penicillins Swelling   Did it involve swelling of the face/tongue/throat, SOB, or low BP? No Did it involve sudden or severe rash/hives, skin  peeling, or any reaction on the inside of your mouth or nose? No Did you need to seek medical attention at a hospital or doctor's office? Was already inpatient at the time When did it last happen?2005 If all above answers are "NO", may proceed with cephalosporin use.      Medication List       Accurate as of January 22, 2020 12:55 PM. If you have any questions, ask your nurse or doctor.        aspirin EC 81 MG tablet Take 81 mg by mouth daily.   CHARCOAL ACTIVATED PO Take 520 mg by mouth daily.   finasteride 5 MG tablet Commonly known as: PROSCAR Take 5 mg by mouth every morning.   FISH OIL OMEGA-3 PO Take 1 capsule by mouth daily.   GINKOBA PO Take 120 mg by mouth daily.   ibuprofen 200 MG tablet Commonly known as: ADVIL Take 400 mg by mouth every 6 (six) hours as needed for moderate pain.   loratadine 10 MG tablet Commonly known as: CLARITIN Take 10 mg by mouth daily. For allergies   Magnesium 500 MG Caps Take 500 mg by mouth daily.   Melatonin 10 MG Tabs Take 10 mg by mouth at bedtime as needed (sleep).   psyllium 58.6 % packet Commonly known as: METAMUCIL Take 1 packet by mouth daily as needed (constipation).   tamsulosin 0.4 MG Caps capsule Commonly known as: Flomax Take 1 capsule (0.4 mg total) by mouth at bedtime.       Allergies:  Allergies  Allergen Reactions  . Penicillins Swelling    Did it involve swelling of the face/tongue/throat, SOB, or low BP? No Did it involve sudden or severe rash/hives, skin peeling, or any reaction on the inside of your mouth or nose? No Did you need to seek medical attention at a hospital or doctor's office? Was already inpatient at the time When did it last happen?2005 If all above answers are "NO", may proceed with cephalosporin use.     No family history on file.  Social History:  reports that he quit smoking about 14 years ago. His smoking use included cigarettes. He has never used smokeless  tobacco. He reports current alcohol use of about 4.0 standard drinks of alcohol per week. He reports that he does not use drugs.  ROS: A complete review of systems was performed.  All systems are negative except for pertinent findings as noted.  Physical Exam:  Vital signs in last 24 hours: There were no vitals taken for this visit. Constitutional:  Alert and oriented, No acute distress Respiratory: Normal respiratory effort GI: Abdomen is soft, nontender, nondistended, no  abdominal masses. No CVAT. No hernias. Genitourinary: Normal male phallus, testes are descended bilaterally and non-tender and without masses, scrotum is normal in appearance without lesions or masses, perineum is normal on inspection. Prostate feels about 40 grams. Neurologic: Grossly intact, no focal deficits Psychiatric: Normal mood and affect  I have reviewed prior pt notes  I have reviewed notes from referring/previous physicians  I have reviewed urinalysis results  I have reviewed prior PSA results  Impression/Assessment:  Pt DRE stable and PSA trend has been declining. PSA last checked 07.2020 and will recheck next year.  Plan:  1. Pt continued on finasteride and tamsulosin.  2. F/U in 1 year for OV, DRE, and PSA.  CC: Dr. [name]

## 2020-01-24 DIAGNOSIS — Z23 Encounter for immunization: Secondary | ICD-10-CM | POA: Diagnosis not present

## 2020-01-24 DIAGNOSIS — R7301 Impaired fasting glucose: Secondary | ICD-10-CM | POA: Diagnosis not present

## 2020-01-24 DIAGNOSIS — M5416 Radiculopathy, lumbar region: Secondary | ICD-10-CM | POA: Diagnosis not present

## 2020-01-24 DIAGNOSIS — C9140 Hairy cell leukemia not having achieved remission: Secondary | ICD-10-CM | POA: Diagnosis not present

## 2020-01-24 DIAGNOSIS — Z0001 Encounter for general adult medical examination with abnormal findings: Secondary | ICD-10-CM | POA: Diagnosis not present

## 2020-02-12 DIAGNOSIS — H903 Sensorineural hearing loss, bilateral: Secondary | ICD-10-CM | POA: Diagnosis not present

## 2020-02-12 DIAGNOSIS — H9313 Tinnitus, bilateral: Secondary | ICD-10-CM | POA: Diagnosis not present

## 2020-02-13 DIAGNOSIS — M5416 Radiculopathy, lumbar region: Secondary | ICD-10-CM | POA: Diagnosis not present

## 2020-02-13 DIAGNOSIS — M48061 Spinal stenosis, lumbar region without neurogenic claudication: Secondary | ICD-10-CM | POA: Diagnosis not present

## 2020-02-21 ENCOUNTER — Ambulatory Visit: Payer: Medicare PPO | Attending: Internal Medicine

## 2020-02-21 DIAGNOSIS — Z23 Encounter for immunization: Secondary | ICD-10-CM

## 2020-02-21 NOTE — Progress Notes (Signed)
   Covid-19 Vaccination Clinic  Name:  Cameron Wu    MRN: [id number] DOB: 06-16-1950  02/21/2020  [name] was observed post Covid-19 immunization for 30 minutes based on pre-vaccination screening without incident. He was provided with Vaccine Information Sheet and instruction to access the V-Safe system.   [name] was instructed to call 911 with any severe reactions post vaccine: Marland Kitchen Difficulty breathing  . Swelling of face and throat  . A fast heartbeat  . A bad rash all over body  . Dizziness and weakness

## 2020-02-29 ENCOUNTER — Other Ambulatory Visit (HOSPITAL_COMMUNITY): Payer: Self-pay | Admitting: Internal Medicine

## 2020-02-29 DIAGNOSIS — M25471 Effusion, right ankle: Secondary | ICD-10-CM

## 2020-03-07 DIAGNOSIS — M48061 Spinal stenosis, lumbar region without neurogenic claudication: Secondary | ICD-10-CM | POA: Diagnosis not present

## 2020-03-07 DIAGNOSIS — M5416 Radiculopathy, lumbar region: Secondary | ICD-10-CM | POA: Diagnosis not present

## 2020-04-02 DIAGNOSIS — M5416 Radiculopathy, lumbar region: Secondary | ICD-10-CM | POA: Diagnosis not present

## 2020-04-02 DIAGNOSIS — M48061 Spinal stenosis, lumbar region without neurogenic claudication: Secondary | ICD-10-CM | POA: Diagnosis not present

## 2020-04-02 DIAGNOSIS — R030 Elevated blood-pressure reading, without diagnosis of hypertension: Secondary | ICD-10-CM | POA: Diagnosis not present

## 2020-04-15 DIAGNOSIS — H401131 Primary open-angle glaucoma, bilateral, mild stage: Secondary | ICD-10-CM | POA: Diagnosis not present

## 2020-04-24 ENCOUNTER — Other Ambulatory Visit: Payer: Self-pay | Admitting: Urology

## 2020-04-24 DIAGNOSIS — R972 Elevated prostate specific antigen [PSA]: Secondary | ICD-10-CM

## 2020-04-30 DIAGNOSIS — D225 Melanocytic nevi of trunk: Secondary | ICD-10-CM | POA: Diagnosis not present

## 2020-04-30 DIAGNOSIS — Z808 Family history of malignant neoplasm of other organs or systems: Secondary | ICD-10-CM | POA: Diagnosis not present

## 2020-04-30 DIAGNOSIS — L578 Other skin changes due to chronic exposure to nonionizing radiation: Secondary | ICD-10-CM | POA: Diagnosis not present

## 2020-04-30 DIAGNOSIS — L821 Other seborrheic keratosis: Secondary | ICD-10-CM | POA: Diagnosis not present

## 2020-04-30 DIAGNOSIS — L570 Actinic keratosis: Secondary | ICD-10-CM | POA: Diagnosis not present

## 2020-06-16 ENCOUNTER — Encounter (HOSPITAL_COMMUNITY): Payer: Self-pay | Admitting: Physical Therapy

## 2020-06-16 NOTE — Therapy (Signed)
[place], [place], [address] Phone: [phone]   Fax:  [phone]  Patient Details  Name: Cameron Wu MRN: [id number] Date of Birth: [birth year] Referring Provider:  No ref. provider found  Encounter Date: 06/16/2020   PHYSICAL THERAPY DISCHARGE SUMMARY  Visits from Start of Care: 21  Current functional level related to goals / functional outcomes: At last session, Patient has met 2/2 short term goals with ability to complete HEP and improved symptoms. Patient has met 1/4 long term goals with improved function as shown by improvement in FOTO score.    Remaining deficits: Unknown as patient has not returned    Education / Equipment: Unknown as patient has not returned  Plan: Patient agrees to discharge.  Patient goals were partially met. Patient is being discharged due to not returning since the last visit.  ?????       10:29 AM, 06/16/20 Mearl Latin PT, DPT Physical Therapist at [facility], [place], [address] Phone: [phone]   Fax:  [phone]

## 2020-07-07 DIAGNOSIS — H2512 Age-related nuclear cataract, left eye: Secondary | ICD-10-CM | POA: Diagnosis not present

## 2020-07-07 DIAGNOSIS — H01001 Unspecified blepharitis right upper eyelid: Secondary | ICD-10-CM | POA: Diagnosis not present

## 2020-07-07 DIAGNOSIS — H25811 Combined forms of age-related cataract, right eye: Secondary | ICD-10-CM | POA: Diagnosis not present

## 2020-07-07 DIAGNOSIS — H401131 Primary open-angle glaucoma, bilateral, mild stage: Secondary | ICD-10-CM | POA: Diagnosis not present

## 2020-07-07 DIAGNOSIS — H35351 Cystoid macular degeneration, right eye: Secondary | ICD-10-CM | POA: Diagnosis not present

## 2020-07-21 ENCOUNTER — Other Ambulatory Visit: Payer: Self-pay | Admitting: *Deleted

## 2020-07-21 DIAGNOSIS — C9141 Hairy cell leukemia, in remission: Secondary | ICD-10-CM

## 2020-07-22 NOTE — Progress Notes (Signed)
HEMATOLOGY/ONCOLOGY CLINIC NOTE  Date of Service: 07/23/2020  Patient Care Team: [name], MD as PCP - General (Internal Medicine)  CHIEF COMPLAINTS/PURPOSE OF CONSULTATION:  Hairy Cell Leukemia  Oncologic History:   Cameron Wu was initially diagnosed with Hairy Cell Leukemia in January 1992, at which time he presented with constitutional symptoms and massive splenomegaly. He was treated with Cladribine by special exception protocol from the [facility] before the drug was officially FDA approved. He had progression in July 1995, and ws treated again with Cladribine. The pt then progressed again in June 2007, and was treated with Cladribine and Rituxan. He has remained in remission since that time.  HISTORY OF PRESENTING ILLNESS:   Cameron Wu is a wonderful 70 y.o. male who has been referred to [place] by Dr. Murriel Hopper for evaluation and management of his Hairy Cell Leukemia. The pt reports that he is doing well overall.  The pt reports that he has not developed any new concerns since his last visit with Dr. Beryle Beams a month ago. He denies abdominal pains or worsened energy levels, fevers, chills, night sweats, or unexpected weight loss. The pt notes that he has some chronic right shoulder and knee pain, residual from a fall about a year ago. He denies concerns for recent infections. He has followed with his PCP for is flu and pneumonia vaccinations.  The pt notes that each time he received Cladribine, he received a dose each day for 5 consecutive days in an outpatient setting. The pt denies any major side effects from the chemotherapy, but did have cytopenias.   The pt notes that at his last relapse in 2007, he did not have significant symptoms, but was seen to develop abnormal blood counts, and was confirmed with a BM Bx.  He denies swallowing problems, heart issues, lung issues, or liver problems. He notes that he has a "nervous stomach which growls  for hours after eating." He sees Dr. Diona Fanti in Urology for his BPH and takes Finasteride. The pt notes social alcohol use and denies ever being drunk.  Most recent lab results (07/03/18) of CBC w/diff is as follows: all values are WNL.  On review of systems, pt reports good energy levels, eating well, stable weight, and denies fevers, chills, night sweats, unexpected weight loss, swallowing difficulty, breathing difficulty, leg swelling, skin rashes or lesions, concern for infections, mouth sores, pain along the spine, lower abdominal pains, leg swelling, noticing any new lumps or bumps, and any other symptoms.   On PMHx the pt reports Hairy cell leukemia, environmental allergies  INTERVAL HISTORY:  Cameron Wu is a wonderful 70 y.o. male who is here for evaluation and management of his Hairy Cell Leukemia. The patient's last visit with [place] was on 07/23/2019. The pt reports that she is doing well overall.  The pt reports no new concerns or symptoms. The pt no ne medication changes or medical issues. The pt notes that his back is still bothering him, but this is being monitored and he notes the steroid injections are no longer working. The tinnitus is still bothering him in his ears.  Lab results today 07/23/2020 of CBC w/diff is as follows: all values are WNL. CMP pending. 07/23/2020 [facility] . Lab Results  Component Value Date   LDH 200 (H) 07/23/2020   On review of systems, pt denies fevers, chills, night sweats, abdominal pain/distention, change in energy levels, SOB, and any other symptoms.  MEDICAL HISTORY:  Past Medical  History:  Diagnosis Date  . Abnormal liver function tests 05/03/2011  . BPH (benign prostatic hypertrophy) 05/03/2011  . Elevated transaminase level   . GERD (gastroesophageal reflux disease)   . Hairy cell leukemia   . Hairy cell leukemia    1992  . Hairy cell leukemia, in remission ([place]) 05/03/2011  . IBS (irritable bowel syndrome) 05/03/2011  . Irritable bowel  syndrome    2 yrs ago, states he had inflammation around anus. Biopsy: inclusive    SURGICAL HISTORY: Past Surgical History:  Procedure Laterality Date  . BONE MARROW BIOPSY     2007  . COLONOSCOPY  08   with polyps  . COLONOSCOPY  09/30/2011   Procedure: COLONOSCOPY;  Surgeon: Rogene Houston, MD;  Location: [facility];  Service: Endoscopy;  Laterality: N/A;  730  . COLONOSCOPY N/A 01/11/2019   Procedure: COLONOSCOPY;  Surgeon: Rogene Houston, MD;  Location: [facility];  Service: Endoscopy;  Laterality: N/A;  100-office move to 9:30am  . HX COLON POLYPS    . TONSILLECTOMY     childhood    SOCIAL HISTORY: Social History   Socioeconomic History  . Marital status: Married    Spouse name: Not on file  . Number of children: Not on file  . Years of education: Not on file  . Highest education level: Not on file  Occupational History  . Not on file  Tobacco Use  . Smoking status: Former Smoker    Types: Cigarettes    Quit date: 12/18/2005    Years since quitting: 14.6  . Smokeless tobacco: Never Used  Substance and Sexual Activity  . Alcohol use: Yes    Alcohol/week: 4.0 standard drinks    Types: 4 Cans of beer per week    Comment: social  . Drug use: No  . Sexual activity: Not on file  Other Topics Concern  . Not on file  Social History Narrative  . Not on file   Social Determinants of Health   Financial Resource Strain: Not on file  Food Insecurity: Not on file  Transportation Needs: Not on file  Physical Activity: Not on file  Stress: Not on file  Social Connections: Not on file  Intimate Partner Violence: Not on file    FAMILY HISTORY: No family history on file.  ALLERGIES:  is allergic to penicillins.  MEDICATIONS:  Current Outpatient Medications  Medication Sig Dispense Refill  . aspirin EC 81 MG tablet Take 81 mg by mouth daily.     Marland Kitchen CHARCOAL ACTIVATED PO Take 520 mg by mouth daily.     . finasteride (PROSCAR) 5 MG tablet Take 1 tablet (5 mg  total) by mouth every morning. 90 tablet 3  . Ginkgo Biloba (GINKOBA PO) Take 120 mg by mouth daily.    Marland Kitchen ibuprofen (ADVIL,MOTRIN) 200 MG tablet Take 400 mg by mouth every 6 (six) hours as needed for moderate pain.     Marland Kitchen loratadine (CLARITIN) 10 MG tablet Take 10 mg by mouth daily. For allergies    . Magnesium 500 MG CAPS Take 500 mg by mouth daily.     . Melatonin 10 MG TABS Take 10 mg by mouth at bedtime as needed (sleep).     . Omega-3 Fatty Acids (FISH OIL OMEGA-3 PO) Take 1 capsule by mouth daily.    . psyllium (METAMUCIL) 58.6 % packet Take 1 packet by mouth daily as needed (constipation).     . tamsulosin (FLOMAX) 0.4 MG CAPS capsule  Take 1 capsule (0.4 mg total) by mouth at bedtime. 90 capsule 3   No current facility-administered medications for this visit.    REVIEW OF SYSTEMS:   10 Point review of Systems was done is negative except as noted above.  PHYSICAL EXAMINATION: ECOG PERFORMANCE STATUS: 1 - Symptomatic but completely ambulatory  . Vitals:   07/23/20 1200  BP: 135/74  Pulse: 78  Resp: 18  Temp: (!) 97.2 F (36.2 C)  SpO2: 98%   Filed Weights   07/23/20 1200  Weight: 176 lb 1.6 oz (79.9 kg)   .Body mass index is 23.88 kg/m.   NAD. GENERAL:alert, in no acute distress and comfortable SKIN: no acute rashes, no significant lesions EYES: conjunctiva are pink and non-injected, sclera anicteric OROPHARYNX: MMM, no exudates, no oropharyngeal erythema or ulceration NECK: supple, no JVD LYMPH:  no palpable lymphadenopathy in the cervical, axillary or inguinal regions LUNGS: clear to auscultation b/l with normal respiratory effort HEART: regular rate & rhythm ABDOMEN:  normoactive bowel sounds , non tender, not distended. Extremity: no pedal edema PSYCH: alert & oriented x 3 with fluent speech NEURO: no focal motor/sensory deficits   LABORATORY DATA:  I have reviewed the data as listed  . CBC Latest Ref Rng & Units 07/23/2020 07/23/2019 01/22/2019  WBC 4.0 -  10.5 K/uL 7.5 6.6 6.7  Hemoglobin 13.0 - 17.0 g/dL 14.3 14.1 14.1  Hematocrit 39.0 - 52.0 % 43.1 42.6 42.1  Platelets 150 - 400 K/uL 160 161 163    . CMP Latest Ref Rng & Units 07/23/2020 07/23/2019 01/22/2019  Glucose 70 - 99 mg/dL 89 97 68(L)  BUN 8 - 23 mg/dL _0 Creatinine 0.61 - 1.24 mg/dL 1.10 1.01 0.99  Sodium 135 - 145 mmol/L 142 145 144  Potassium 3.5 - 5.1 mmol/L 4.1 4.0 4.1  Chloride 98 - 111 mmol/L 104 110 107  CO2 22 - 32 mmol/L _1 Calcium 8.9 - 10.3 mg/dL 9.0 9.0 9.2  Total Protein 6.5 - 8.1 g/dL 6.9 6.8 6.7  Total Bilirubin 0.3 - 1.2 mg/dL 0.6 0.4 0.4  Alkaline Phos 38 - 126 U/L 81 78 82  AST 15 - 41 U/L _2 ALT 0 - 44 U/L _3 12/02/05 BM Bx:   12/02/05 Flow Cytometry:     RADIOGRAPHIC STUDIES: I have personally reviewed the radiological images as listed and agreed with the findings in the report. No results found.  ASSESSMENT & PLAN:   70 y.o. male with  1. Hairy cell leukemia, currently in 3rd remission Diagnosed in January 1992 presented with constitutional symptoms and massive splenomegaly. S/p Cladribine July 1995 progression  S/p Cladribine June 2007 progression S/p Cladribine and Rituxan  PLAN: -Discussed pt labwork today, 07/23/2020; blood counts all completely normal, other labs pending. -Advised pt that it is reassuring he has been in remission for an extended period of time. -Advised pt that HCL typically comes back gradually and we can consolidate care with PCP if desired. -Discussed targeted therapy approaches to HCL. -The pt shows no clinical or lab progression/return of his hairy cell leukemia at this time.  -No indication for further treatment at this time. -Will see back in 12 months with labs.   FOLLOW UP: RTC with Dr Irene Limbo with labs in 12 months   The total time spent in the appt was 20 minutes and more than 50% was on counseling and direct patient cares.  All of the  patient's questions were answered  with apparent satisfaction. The patient knows to call the clinic with any problems, questions or concerns.    Sullivan Lone MD [place] AAHIVMS [facility] Hematology/Oncology Physician [facility]  (Office):       [phone] (Work cell):  [phone] (Fax):           [phone]  07/23/2020 12:25 PM  I, Reinaldo Raddle, am acting as scribe for Dr. Sullivan Lone, MD.    .I have reviewed the above documentation for accuracy and completeness, and I agree with the above. Brunetta Genera MD

## 2020-07-23 ENCOUNTER — Inpatient Hospital Stay: Payer: Medicare PPO | Admitting: Hematology

## 2020-07-23 ENCOUNTER — Inpatient Hospital Stay: Payer: Medicare PPO | Attending: Hematology

## 2020-07-23 ENCOUNTER — Other Ambulatory Visit: Payer: Self-pay

## 2020-07-23 VITALS — BP 135/74 | HR 78 | Temp 97.2°F | Resp 18 | Wt 176.1 lb

## 2020-07-23 DIAGNOSIS — K589 Irritable bowel syndrome without diarrhea: Secondary | ICD-10-CM | POA: Insufficient documentation

## 2020-07-23 DIAGNOSIS — C9141 Hairy cell leukemia, in remission: Secondary | ICD-10-CM | POA: Diagnosis not present

## 2020-07-23 DIAGNOSIS — Z9221 Personal history of antineoplastic chemotherapy: Secondary | ICD-10-CM | POA: Diagnosis not present

## 2020-07-23 DIAGNOSIS — Z87891 Personal history of nicotine dependence: Secondary | ICD-10-CM | POA: Diagnosis not present

## 2020-07-23 DIAGNOSIS — Z79899 Other long term (current) drug therapy: Secondary | ICD-10-CM | POA: Diagnosis not present

## 2020-07-23 DIAGNOSIS — N400 Enlarged prostate without lower urinary tract symptoms: Secondary | ICD-10-CM | POA: Diagnosis not present

## 2020-07-23 DIAGNOSIS — Z7982 Long term (current) use of aspirin: Secondary | ICD-10-CM | POA: Insufficient documentation

## 2020-07-23 DIAGNOSIS — H9313 Tinnitus, bilateral: Secondary | ICD-10-CM | POA: Insufficient documentation

## 2020-07-23 LAB — CMP (CANCER CENTER ONLY)
ALT: 27 U/L (ref 0–44)
AST: 19 U/L (ref 15–41)
Albumin: 4.1 g/dL (ref 3.5–5.0)
Alkaline Phosphatase: 81 U/L (ref 38–126)
Anion gap: 9 (ref 5–15)
BUN: 15 mg/dL (ref 8–23)
CO2: 29 mmol/L (ref 22–32)
Calcium: 9 mg/dL (ref 8.9–10.3)
Chloride: 104 mmol/L (ref 98–111)
Creatinine: 1.1 mg/dL (ref 0.61–1.24)
GFR, Estimated: >60 mL/min (ref >60)
Glucose, Bld: 89 mg/dL (ref 70–99)
Potassium: 4.1 mmol/L (ref 3.5–5.1)
Sodium: 142 mmol/L (ref 135–145)
Total Bilirubin: 0.6 mg/dL (ref 0.3–1.2)
Total Protein: 6.9 g/dL (ref 6.5–8.1)

## 2020-07-23 LAB — CBC WITH DIFFERENTIAL (CANCER CENTER ONLY)
Abs Immature Granulocytes: 0.01 10*3/uL (ref 0.00–0.07)
Basophils Absolute: 0 10*3/uL (ref 0.0–0.1)
Basophils Relative: 0 %
Eosinophils Absolute: 0 10*3/uL (ref 0.0–0.5)
Eosinophils Relative: 0 %
HCT: 43.1 % (ref 39.0–52.0)
Hemoglobin: 14.3 g/dL (ref 13.0–17.0)
Immature Granulocytes: 0 %
Lymphocytes Relative: 22 %
Lymphs Abs: 1.6 10*3/uL (ref 0.7–4.0)
MCH: 30.8 pg (ref 26.0–34.0)
MCHC: 33.2 g/dL (ref 30.0–36.0)
MCV: 92.7 fL (ref 80.0–100.0)
Monocytes Absolute: 0.5 10*3/uL (ref 0.1–1.0)
Monocytes Relative: 7 %
Neutro Abs: 5.4 10*3/uL (ref 1.7–7.7)
Neutrophils Relative %: 71 %
Platelet Count: 160 10*3/uL (ref 150–400)
RBC: 4.65 MIL/uL (ref 4.22–5.81)
RDW: 13.5 % (ref 11.5–15.5)
WBC Count: 7.5 10*3/uL (ref 4.0–10.5)
nRBC: 0 % (ref 0.0–0.2)

## 2020-07-23 LAB — LACTATE DEHYDROGENASE: LDH: 200 U/L — ABNORMAL HIGH (ref 98–192)

## 2020-08-06 NOTE — H&P (Signed)
Surgical History & Physical  Patient Name: Cameron Wu DOB: Dec 27, 1950  Surgery: Cataract extraction with intraocular lens implant phacoemulsification; Right Eye  Surgeon: Baruch Goldmann MD Surgery Date:  08/15/2020 Pre-Op Date:  08/06/2020  HPI: A 31 Yr. old male patient Pt is present for cataract evaluation. The patient complains of difficulty when seeing at distance, which began 1 year ago. Both eyes are affected OD>OS. The condition's severity is worsening. The complaint is associated with glare and halos. Pt states driving in the morning or at night has been become very difficult. Symptoms are negatively affecting pt's quality of life. Denies any eye drops, flashes of light. HPI Completed by Dr. Baruch Goldmann  Medical History: Cataracts Hairy Cell Leukemia Back pinced nerve Bulging Di...  Review of Systems Negative Allergic/Immunologic Negative Cardiovascular Negative Constitutional Negative Ear, Nose, Mouth & Throat Negative Endocrine Negative Eyes Negative Gastrointestinal Negative Genitourinary Negative Hemotologic/Lymphatic Negative Integumentary Negative Musculoskeletal Negative Neurological Negative Psychiatry Negative Respiratory  Social   Former smoker   Medication Finasteride, Tamsulosin, Fish Oil, Activated charcoal, Loratadine, Magnesium, Aspirin,   Sx/Procedures Tonsilectomy, Colonoscopy,   Drug Allergies  Peniciline,   History & Physical: Heent:  Cataract, Right eye NECK: supple without bruits LUNGS: lungs clear to auscultation CV: regular rate and rhythm Abdomen: soft and non-tender   Impression & Plan: Assessment: 1.  COMBINED FORMS AGE RELATED CATARACT; Right Eye (H25.811) 2.  PRIMARY OPEN ANGLE GLAUCOMA; Both Eyes Mild (H40.1131) 3.  CYSTOID MACULAR DEGENERATION; Right Eye (H35.351) 4.  BLEPHARITIS; Right Upper Lid, Right Lower Lid, Left Upper Lid, Left Lower Lid (H01.001, H01.002,H01.004,H01.005) 5.  Pinguecula; Both Eyes  (H11.153) 6.  NUCLEAR SCLEROSIS AGE RELATED; Left Eye (H25.12) 7.  DERMATOCHALASIS; Right Upper Lid, Left Upper Lid (H02.831, H02.834) 8.  ASTIGMATISM, REGULAR; Both Eyes (H52.223)  Plan: 1.  Cataract accounts for the patient's decreased vision. This visual impairment is not correctable with a tolerable change in glasses or contact lenses. Cataract surgery with an implantation of a new lens should significantly improve the visual and functional status of the patient. Discussed all risks, benefits, alternatives, and potential complications. Discussed the procedures and recovery. Patient desires to have surgery. A-scan ordered and performed today for intra-ocular lens calculations. The surgery will be performed in order to improve vision for driving, reading, and for eye examinations. Recommend phacoemulsification with intra-ocular lens. Recommend Dextenza for post-operative pain and inflammation. Right Eye worse - first and only right now. Dilates well - shugarcaine by protocol. Consider Toric lens - Vivity or Eyhance. 2.  New. Positive family history. Gonio shows open angles. Thin corneas. Findings, prognosis and treatment options reviewed. OCT rNFL shows inferior thinning OS - but unreliable. IOPs stable today- just at goal with adjustment for pachy. HVF 24-2 WNL OD, possible superior depression OS. Will monitor closely. Detailed discussion about glaucoma today including importance of maintaining good follow up and following treatment plan, and the possibility of irreversible blindness as part of this disease process. Given mild findings and excellent IOP - deferred tx at this point. 3.  Resolved on OC macula today. Call with any worsening vision, pain, or any other concerns. 4.  regular lid cleaning. 5.  6.  Borderline visually significant - will address after right eye. 7.  Likely visually significant - will address after cataract surgury. 8.  Recommend toric lens.

## 2020-08-07 NOTE — Patient Instructions (Signed)
Cameron Wu  08/07/2020     @PREFPERIOPPHARMACY @   Your procedure is scheduled on  08/15/2020.   Report to [name] at  [phone]  A.M.   Call this number if you have problems the morning of surgery:  [phone]   Remember:  Do not eat or drink after midnight.                        Take these medicines the morning of surgery with A SIP OF WATER  Proscar.     Please brush your teeth.  Do not wear jewelry, make-up or nail polish.  Do not wear lotions, powders, or perfumes, or deodorant.  Do not shave 48 hours prior to surgery.  Men may shave face and neck.  Do not bring valuables to the hospital.  Cameron Wu is not responsible for any belongings or valuables.  Contacts, dentures or bridgework may not be worn into surgery.  Leave your suitcase in the car.  After surgery it may be brought to your room.  For patients admitted to the hospital, discharge time will be determined by your treatment team.  Patients discharged the day of surgery will not be allowed to drive home and must have someone with them for 24 hours.   Special instructions:  DO NOT smoke tobacco or vape for 24 hours before your proceudre.  Please read over the following fact sheets that you were given. Anesthesia Post-op Instructions and Care and Recovery After Surgery      Cataract Surgery, Care After This sheet gives you information about how to care for yourself after your procedure. Your health care provider may also give you more specific instructions. If you have problems or questions, contact your health care provider. What can I expect after the procedure? After the procedure, it is common to have:  Itching.  Discomfort.  Fluid discharge.  Sensitivity to light and to touch.  Bruising in or around the eye.  Mild blurred vision. Follow these instructions at home: Eye care  Do not touch or rub your eyes.  Protect your eyes as told by your health care provider. You may be  told to wear a protective eye shield or sunglasses.  Do not put a contact lens into the affected eye or eyes until your health care provider approves.  Keep the area around your eye clean and dry: ? Avoid swimming. ? Do not allow water to hit you directly in the face while showering. ? Keep soap and shampoo out of your eyes.  Check your eye every day for signs of infection. Watch for: ? Redness, swelling, or pain. ? Fluid, blood, or pus. ? Warmth. ? A bad smell. ? Vision that is getting worse. ? Sensitivity that is getting worse.   Activity  Do not drive for 24 hours if you were given a sedative during your procedure.  Avoid strenuous activities, such as playing contact sports, for as long as told by your health care provider.  Do not drive or use heavy machinery until your health care provider approves.  Do not bend or lift heavy objects. Bending increases pressure in the eye. You can walk, climb stairs, and do light household chores.  Ask your health care provider when you can return to work. If you work in a dusty environment, you may be advised to wear protective eyewear for a period of time. General instructions  Take or apply over-the-counter and prescription  medicines only as told by your health care provider. This includes eye drops.  Keep all follow-up visits as told by your health care provider. This is important. Contact a health care provider if:  You have increased bruising around your eye.  You have pain that is not helped with medicine.  You have a fever.  You have redness, swelling, or pain in your eye.  You have fluid, blood, or pus coming from your incision.  Your vision gets worse.  Your sensitivity to light gets worse. Get help right away if:  You have sudden loss of vision.  You see flashes of light or spots (floaters).  You have severe eye pain.  You develop nausea or vomiting. Summary  After your procedure, it is common to have itching,  discomfort, bruising, fluid discharge, or sensitivity to light.  Follow instructions from your health care provider about caring for your eye after the procedure.  Do not rub your eye after the procedure. You may need to wear eye protection or sunglasses. Do not wear contact lenses. Keep the area around your eye clean and dry.  Avoid activities that require a lot of effort. These include playing sports and lifting heavy objects.  Contact a health care provider if you have increased bruising, pain that does not go away, or a fever. Get help right away if you suddenly lose your vision, see flashes of light or spots, or have severe pain in the eye. This information is not intended to replace advice given to you by your health care provider. Make sure you discuss any questions you have with your health care provider. Document Revised: 01/30/2019 Document Reviewed: 10/03/2017 Elsevier Patient Education  [place].

## 2020-08-11 ENCOUNTER — Encounter (HOSPITAL_COMMUNITY): Payer: Self-pay

## 2020-08-11 ENCOUNTER — Other Ambulatory Visit: Payer: Self-pay

## 2020-08-11 ENCOUNTER — Encounter (HOSPITAL_COMMUNITY)
Admission: RE | Admit: 2020-08-11 | Discharge: 2020-08-11 | Disposition: A | Discharge status: Home / Self Care | Payer: Medicare PPO | Source: Ambulatory Visit | Attending: Ophthalmology | Admitting: Ophthalmology

## 2020-08-11 DIAGNOSIS — H25811 Combined forms of age-related cataract, right eye: Secondary | ICD-10-CM | POA: Diagnosis not present

## 2020-08-13 ENCOUNTER — Other Ambulatory Visit: Payer: Self-pay

## 2020-08-13 ENCOUNTER — Other Ambulatory Visit (HOSPITAL_COMMUNITY)
Admission: RE | Admit: 2020-08-13 | Discharge: 2020-08-13 | Disposition: A | Discharge status: Home / Self Care | Payer: Medicare PPO | Source: Ambulatory Visit | Attending: Ophthalmology | Admitting: Ophthalmology

## 2020-08-13 DIAGNOSIS — Z20822 Contact with and (suspected) exposure to covid-19: Secondary | ICD-10-CM | POA: Diagnosis not present

## 2020-08-13 DIAGNOSIS — Z01812 Encounter for preprocedural laboratory examination: Secondary | ICD-10-CM | POA: Diagnosis not present

## 2020-08-13 LAB — SARS CORONAVIRUS 2 (TAT 6-24 HRS): SARS Coronavirus 2: NEGATIVE

## 2020-08-15 ENCOUNTER — Ambulatory Visit (HOSPITAL_COMMUNITY): Payer: Medicare PPO | Admitting: Anesthesiology

## 2020-08-15 ENCOUNTER — Other Ambulatory Visit: Payer: Self-pay

## 2020-08-15 ENCOUNTER — Encounter (HOSPITAL_COMMUNITY): Payer: Self-pay | Admitting: Ophthalmology

## 2020-08-15 ENCOUNTER — Ambulatory Visit (HOSPITAL_COMMUNITY)
Admission: RE | Admit: 2020-08-15 | Discharge: 2020-08-15 | Disposition: A | Discharge status: Home / Self Care | Payer: Medicare PPO | Attending: Ophthalmology | Admitting: Ophthalmology

## 2020-08-15 ENCOUNTER — Encounter (HOSPITAL_COMMUNITY)
Admission: RE | Disposition: A | Discharge status: Home / Self Care | Payer: Self-pay | Source: Home / Self Care | Attending: Ophthalmology

## 2020-08-15 DIAGNOSIS — H0100B Unspecified blepharitis left eye, upper and lower eyelids: Secondary | ICD-10-CM | POA: Diagnosis not present

## 2020-08-15 DIAGNOSIS — H59031 Cystoid macular edema following cataract surgery, right eye: Secondary | ICD-10-CM | POA: Insufficient documentation

## 2020-08-15 DIAGNOSIS — H52223 Regular astigmatism, bilateral: Secondary | ICD-10-CM | POA: Insufficient documentation

## 2020-08-15 DIAGNOSIS — H401131 Primary open-angle glaucoma, bilateral, mild stage: Secondary | ICD-10-CM | POA: Diagnosis not present

## 2020-08-15 DIAGNOSIS — H02834 Dermatochalasis of left upper eyelid: Secondary | ICD-10-CM | POA: Insufficient documentation

## 2020-08-15 DIAGNOSIS — H11153 Pinguecula, bilateral: Secondary | ICD-10-CM | POA: Insufficient documentation

## 2020-08-15 DIAGNOSIS — Z87891 Personal history of nicotine dependence: Secondary | ICD-10-CM | POA: Insufficient documentation

## 2020-08-15 DIAGNOSIS — Z7982 Long term (current) use of aspirin: Secondary | ICD-10-CM | POA: Diagnosis not present

## 2020-08-15 DIAGNOSIS — H25811 Combined forms of age-related cataract, right eye: Secondary | ICD-10-CM | POA: Insufficient documentation

## 2020-08-15 DIAGNOSIS — H0100A Unspecified blepharitis right eye, upper and lower eyelids: Secondary | ICD-10-CM | POA: Diagnosis not present

## 2020-08-15 DIAGNOSIS — H02831 Dermatochalasis of right upper eyelid: Secondary | ICD-10-CM | POA: Diagnosis not present

## 2020-08-15 DIAGNOSIS — Z79899 Other long term (current) drug therapy: Secondary | ICD-10-CM | POA: Insufficient documentation

## 2020-08-15 DIAGNOSIS — G4730 Sleep apnea, unspecified: Secondary | ICD-10-CM | POA: Diagnosis not present

## 2020-08-15 HISTORY — PX: CATARACT EXTRACTION W/PHACO: SHX586

## 2020-08-15 SURGERY — PHACOEMULSIFICATION, CATARACT, WITH IOL INSERTION
Anesthesia: Monitor Anesthesia Care | Site: Eye | Laterality: Right

## 2020-08-15 MED ORDER — SODIUM HYALURONATE 10 MG/ML IO SOLUTION
PREFILLED_SYRINGE | INTRAOCULAR | Status: DC | PRN
  Administered 2020-08-15: 0.85 mL via INTRAOCULAR

## 2020-08-15 MED ORDER — BSS IO SOLN
INTRAOCULAR | Status: DC | PRN
  Administered 2020-08-15: 15 mL

## 2020-08-15 MED ORDER — PHENYLEPHRINE HCL 2.5 % OP SOLN
1.0000 [drp] | OPHTHALMIC | Status: AC | PRN
  Administered 2020-08-15 (×3): 1 [drp] via OPHTHALMIC

## 2020-08-15 MED ORDER — SODIUM HYALURONATE 23MG/ML IO SOSY
PREFILLED_SYRINGE | INTRAOCULAR | Status: DC | PRN
  Administered 2020-08-15: 0.6 mL via INTRAOCULAR

## 2020-08-15 MED ORDER — EPINEPHRINE PF 1 MG/ML IJ SOLN
INTRAOCULAR | Status: DC | PRN
  Administered 2020-08-15: 500 mL

## 2020-08-15 MED ORDER — LIDOCAINE HCL (PF) 1 % IJ SOLN
INTRAOCULAR | Status: DC | PRN
  Administered 2020-08-15: 1 mL via OPHTHALMIC

## 2020-08-15 MED ORDER — NEOMYCIN-POLYMYXIN-DEXAMETH 3.5-10000-0.1 OP SUSP
OPHTHALMIC | Status: DC | PRN
  Administered 2020-08-15: 1 [drp] via OPHTHALMIC

## 2020-08-15 MED ORDER — EPINEPHRINE PF 1 MG/ML IJ SOLN
INTRAMUSCULAR | Status: AC
  Filled 2020-08-15: qty 2

## 2020-08-15 MED ORDER — TETRACAINE HCL 0.5 % OP SOLN
1.0000 [drp] | OPHTHALMIC | Status: AC | PRN
  Administered 2020-08-15 (×3): 1 [drp] via OPHTHALMIC

## 2020-08-15 MED ORDER — STERILE WATER FOR IRRIGATION IR SOLN
Status: DC | PRN
  Administered 2020-08-15: 250 mL

## 2020-08-15 MED ORDER — POVIDONE-IODINE 5 % OP SOLN
OPHTHALMIC | Status: DC | PRN
  Administered 2020-08-15: 1 via OPHTHALMIC

## 2020-08-15 MED ORDER — LIDOCAINE HCL 3.5 % OP GEL
1.0000 "application " | Freq: Once | OPHTHALMIC | Status: AC
  Administered 2020-08-15: 1 via OPHTHALMIC

## 2020-08-15 MED ORDER — TROPICAMIDE 1 % OP SOLN
1.0000 [drp] | OPHTHALMIC | Status: AC
  Administered 2020-08-15 (×3): 1 [drp] via OPHTHALMIC

## 2020-08-15 SURGICAL SUPPLY — 11 items
CLOTH BEACON ORANGE TIMEOUT ST (SAFETY) ×2 IMPLANT
EYE SHIELD UNIVERSAL CLEAR (GAUZE/BANDAGES/DRESSINGS) ×2 IMPLANT
GLOVE SURG UNDER POLY LF SZ7 (GLOVE) ×4 IMPLANT
NEEDLE HYPO 18GX1.5 BLUNT FILL (NEEDLE) ×2 IMPLANT
PAD ARMBOARD 7.5X6 YLW CONV (MISCELLANEOUS) ×2 IMPLANT
SYR TB 1ML LL NO SAFETY (SYRINGE) ×2 IMPLANT
TAPE SURG TRANSPORE 1 IN (GAUZE/BANDAGES/DRESSINGS) ×1 IMPLANT
TAPE SURGICAL TRANSPORE 1 IN (GAUZE/BANDAGES/DRESSINGS) ×2
TECNIS 1 PIECE IOL (Intraocular Lens) ×2 IMPLANT
VISCOELASTIC ADDITIONAL (OPHTHALMIC RELATED) IMPLANT
WATER STERILE IRR 250ML POUR (IV SOLUTION) ×2 IMPLANT

## 2020-08-15 NOTE — Anesthesia Preprocedure Evaluation (Signed)
Anesthesia Evaluation  Patient identified by MRN, date of birth, ID band Patient awake    Reviewed: Allergy & Precautions, NPO status , Patient's Chart, lab work & pertinent test results  History of Anesthesia Complications Negative for: history of anesthetic complications  Airway Mallampati: III  TM Distance: >3 FB     Dental  (+) Dental Advisory Given, Caps Crowns  :   Pulmonary neg sleep apnea (snoring), former smoker,    Pulmonary exam normal breath sounds clear to auscultation       Cardiovascular Exercise Tolerance: Good Normal cardiovascular exam Rhythm:Regular Rate:Normal     Neuro/Psych negative neurological ROS  negative psych ROS   GI/Hepatic Neg liver ROS, GERD  Controlled,  Endo/Other  negative endocrine ROS  Renal/GU negative Renal ROS     Musculoskeletal  (+) Arthritis  (back pain from injury),   Abdominal   Peds  Hematology negative hematology ROS (+)   Anesthesia Other Findings Snoring   Reproductive/Obstetrics negative OB ROS                            Anesthesia Physical Anesthesia Plan  ASA: II  Anesthesia Plan: MAC   Post-op Pain Management:    Induction:   PONV Risk Score and Plan:   Airway Management Planned: Nasal Cannula and Natural Airway  Additional Equipment:   Intra-op Plan:   Post-operative Plan:   Informed Consent: I have reviewed the patients History and Physical, chart, labs and discussed the procedure including the risks, benefits and alternatives for the proposed anesthesia with the patient or authorized representative who has indicated his/her understanding and acceptance.       Plan Discussed with: CRNA and Surgeon  Anesthesia Plan Comments:         Anesthesia Quick Evaluation

## 2020-08-15 NOTE — Discharge Instructions (Signed)
Please discharge patient when stable, will follow up today with Dr. Rockell Faulks at the [facility] [place] office immediately following discharge.  Leave shield in place until visit.  All paperwork with discharge instructions will be given at the office.   [facility] Address:  [address]  [place], [place] [address]  

## 2020-08-15 NOTE — Anesthesia Postprocedure Evaluation (Signed)
Anesthesia Post Note  Patient: Cameron Wu  Procedure(s) Performed: CATARACT EXTRACTION PHACO AND INTRAOCULAR LENS PLACEMENT RIGHT EYE (Right Eye)  Patient location during evaluation: Phase II Anesthesia Type: MAC Level of consciousness: awake and alert and oriented Pain management: pain level controlled Vital Signs Assessment: post-procedure vital signs reviewed and stable Respiratory status: spontaneous breathing and respiratory function stable Cardiovascular status: stable and blood pressure returned to baseline Postop Assessment: no apparent nausea or vomiting Anesthetic complications: no   No complications documented.   Last Vitals:  Vitals:   08/15/20 0704 08/15/20 0822  BP: (!) 145/80 131/85  Pulse: 67 (!) 58  Resp: 16 18  Temp: 36.5 C 36.6 C  SpO2: 100% 98%    Last Pain:  Vitals:   08/15/20 0822  TempSrc: Oral  PainSc: 3                  Cabe Lashley C Jaray Boliver

## 2020-08-15 NOTE — Anesthesia Procedure Notes (Signed)
Anesthesia Procedure Note     

## 2020-08-15 NOTE — Transfer of Care (Signed)
Immediate Anesthesia Transfer of Care Note  Patient: Cameron Wu  Procedure(s) Performed: CATARACT EXTRACTION PHACO AND INTRAOCULAR LENS PLACEMENT RIGHT EYE (Right Eye)  Patient Location: PACU  Anesthesia Type:MAC  Level of Consciousness: awake, alert  and oriented  Airway & Oxygen Therapy: Patient Spontanous Breathing  Post-op Assessment: Report given to RN and Post -op Vital signs reviewed and stable  Post vital signs: Reviewed and stable  Last Vitals:  Vitals Value Taken Time  BP    Temp    Pulse    Resp    SpO2      Last Pain:  Vitals:   08/15/20 0704  TempSrc: Oral  PainSc: 6          Complications: No complications documented.

## 2020-08-15 NOTE — Interval H&P Note (Signed)
History and Physical Interval Note:  08/15/2020 7:55 AM  Cameron Wu  has presented today for surgery, with the diagnosis of Nuclear sclerotic cataract - Right eye.  The various methods of treatment have been discussed with the patient and family. After consideration of risks, benefits and other options for treatment, the patient has consented to  Procedure(s) with comments: CATARACT EXTRACTION PHACO AND INTRAOCULAR LENS PLACEMENT RIGHT EYE (Right) - right as a surgical intervention.  The patient's history has been reviewed, patient examined, no change in status, stable for surgery.  I have reviewed the patient's chart and labs.  Questions were answered to the patient's satisfaction.     Baruch Goldmann

## 2020-08-15 NOTE — Op Note (Signed)
Date of procedure: 08/15/20  Pre-operative diagnosis:  Visually significant combined form age-related cataract, Right Eye (H25.811)  Post-operative diagnosis:  Visually significant combined form age-related cataract, Right Eye (H25.811)  Procedure: Removal of cataract via phacoemulsification and insertion of intra-ocular lens Wynetta Emery and [employer] DCB00  +18.0D into the capsular bag of the Right Eye  Attending surgeon: Gerda Diss. Erubiel Manasco, MD, MA  Anesthesia: MAC, Topical Akten  Complications: None  Estimated Blood Loss: <44m (minimal)  Specimens: None  Implants: As above  Indications:  Visually significant age-related cataract, Right Eye  Procedure:  The patient was seen and identified in the pre-operative area. The operative eye was identified and dilated.  The operative eye was marked.  Topical anesthesia was administered to the operative eye.     The patient was then to the operative suite and placed in the supine position.  A timeout was performed confirming the patient, procedure to be performed, and all other relevant information.   The patient's face was prepped and draped in the usual fashion for intra-ocular surgery.  A lid speculum was placed into the operative eye and the surgical microscope moved into place and focused.  A superotemporal paracentesis was created using a 20 gauge paracentesis blade.  Shugarcaine was injected into the anterior chamber.  Viscoelastic was injected into the anterior chamber.  A temporal clear-corneal main wound incision was created using a 2.429mmicrokeratome.  A continuous curvilinear capsulorrhexis was initiated using an irrigating cystitome and completed using capsulorrhexis forceps.  Hydrodissection and hydrodeliniation were performed.  Viscoelastic was injected into the anterior chamber.  A phacoemulsification handpiece and a chopper as a second instrument were used to remove the nucleus and epinucleus. The irrigation/aspiration handpiece was  used to remove any remaining cortical material.   The capsular bag was reinflated with viscoelastic, checked, and found to be intact.  The intraocular lens was inserted into the capsular bag.  The irrigation/aspiration handpiece was used to remove any remaining viscoelastic.  The clear corneal wound and paracentesis wounds were then hydrated and checked with Weck-Cels to be watertight.  The lid-speculum was removed.  The drape was removed.  The patient's face was cleaned with a wet and dry 4x4.   Maxitrol was instilled in the eye. A clear shield was taped over the eye. The patient was taken to the post-operative care unit in good condition, having tolerated the procedure well.  Post-Op Instructions: [facility] and will receive all other orders and instructions.

## 2020-08-19 ENCOUNTER — Encounter (HOSPITAL_COMMUNITY): Payer: Self-pay | Admitting: Ophthalmology

## 2020-08-20 DIAGNOSIS — M5416 Radiculopathy, lumbar region: Secondary | ICD-10-CM | POA: Diagnosis not present

## 2020-08-21 DIAGNOSIS — Z23 Encounter for immunization: Secondary | ICD-10-CM | POA: Diagnosis not present

## 2020-09-10 DIAGNOSIS — R030 Elevated blood-pressure reading, without diagnosis of hypertension: Secondary | ICD-10-CM | POA: Diagnosis not present

## 2020-09-10 DIAGNOSIS — M48061 Spinal stenosis, lumbar region without neurogenic claudication: Secondary | ICD-10-CM | POA: Diagnosis not present

## 2020-09-17 DIAGNOSIS — N3000 Acute cystitis without hematuria: Secondary | ICD-10-CM | POA: Diagnosis not present

## 2020-10-24 ENCOUNTER — Other Ambulatory Visit: Payer: Self-pay | Admitting: Neurological Surgery

## 2020-11-14 ENCOUNTER — Other Ambulatory Visit: Payer: Self-pay

## 2020-11-14 ENCOUNTER — Encounter (HOSPITAL_COMMUNITY)
Admission: RE | Admit: 2020-11-14 | Discharge: 2020-11-14 | Disposition: A | Discharge status: Home / Self Care | Payer: Medicare PPO | Source: Ambulatory Visit | Attending: Neurological Surgery | Admitting: Neurological Surgery

## 2020-11-14 ENCOUNTER — Encounter (HOSPITAL_COMMUNITY): Payer: Self-pay

## 2020-11-14 DIAGNOSIS — Z20822 Contact with and (suspected) exposure to covid-19: Secondary | ICD-10-CM | POA: Diagnosis not present

## 2020-11-14 DIAGNOSIS — Z01812 Encounter for preprocedural laboratory examination: Secondary | ICD-10-CM | POA: Diagnosis not present

## 2020-11-14 LAB — TYPE AND SCREEN
ABO/RH(D): A POS
Antibody Screen: NEGATIVE

## 2020-11-14 LAB — CBC
HCT: 42.1 % (ref 39.0–52.0)
Hemoglobin: 13.6 g/dL (ref 13.0–17.0)
MCH: 30.4 pg (ref 26.0–34.0)
MCHC: 32.3 g/dL (ref 30.0–36.0)
MCV: 94.2 fL (ref 80.0–100.0)
Platelets: 168 10*3/uL (ref 150–400)
RBC: 4.47 MIL/uL (ref 4.22–5.81)
RDW: 14.1 % (ref 11.5–15.5)
WBC: 6.9 10*3/uL (ref 4.0–10.5)
nRBC: 0 % (ref 0.0–0.2)

## 2020-11-14 LAB — SURGICAL PCR SCREEN
MRSA, PCR: NEGATIVE
Staphylococcus aureus: NEGATIVE

## 2020-11-14 LAB — SARS CORONAVIRUS 2 (TAT 6-24 HRS): SARS Coronavirus 2: NEGATIVE

## 2020-11-14 NOTE — Progress Notes (Signed)
Surgical Instructions    Your procedure is scheduled on Tuesday August 2..  Report to [facility] Main Entrance "A" at 11:00 A.M., then check in with the Admitting office.  Call this number if you have problems the morning of surgery:  [phone]   If you have any questions prior to your surgery date call [phone]: Open Monday-Friday 8am-4pm    Remember:  Do not eat after midnight the night before your surgery  You may drink clear liquids until 10am the morning of your surgery.   Clear liquids allowed are: Water, Non-Citrus Juices (without pulp), Carbonated Beverages, Clear Tea, Black Coffee Only, and Gatorade    Take these medicines the morning of surgery with A SIP OF WATER :NONE    As of today, STOP taking any Aspirin (unless otherwise instructed by your surgeon) Aleve, Naproxen, Ibuprofen, Motrin, Advil, Goody's, BC's, all herbal medications, fish oil, and all vitamins.          Do not wear jewelry or makeup Do not wear lotions, powders, perfumes/colognes, or deodorant. Do not shave 48 hours prior to surgery.  Men may shave face and neck. Do not bring valuables to the hospital. DO Not wear nail polish, gel polish, artificial nails, or any other type of covering on  natural nails including finger and toenails. If patients have artificial nails, gel coating, etc. that need to be removed by a nail salon please have this removed prior to surgery or surgery may need to be canceled/delayed if the surgeon/ anesthesia feels like the patient is unable to be adequately monitored.             [facility] is not responsible for any belongings or valuables.  Do NOT Smoke (Tobacco/Vaping) or drink Alcohol 24 hours prior to your procedure If you use a CPAP at night, you may bring all equipment for your overnight stay.   Contacts, glasses, dentures or bridgework may not be worn into surgery, please bring cases for these belongings   For patients admitted to the hospital, discharge time  will be determined by your treatment team.   Patients discharged the day of surgery will not be allowed to drive home, and someone needs to stay with them for 24 hours.  ONLY 1 SUPPORT PERSON MAY BE PRESENT WHILE YOU ARE IN SURGERY. IF YOU ARE TO BE ADMITTED ONCE YOU ARE IN YOUR ROOM YOU WILL BE ALLOWED TWO (2) VISITORS.  Minor children may have two parents present. Special consideration for safety and communication needs will be reviewed on a case by case basis.  Special instructions:    Oral Hygiene is also important to reduce your risk of infection.  Remember - BRUSH YOUR TEETH THE MORNING OF SURGERY WITH YOUR REGULAR TOOTHPASTE   [facility]- Preparing For Surgery  Before surgery, you can play an important role. Because skin is not sterile, your skin needs to be as free of germs as possible. You can reduce the number of germs on your skin by washing with CHG (chlorahexidine gluconate) Soap before surgery.  CHG is an antiseptic cleaner which kills germs and bonds with the skin to continue killing germs even after washing.     Please do not use if you have an allergy to CHG or antibacterial soaps. If your skin becomes reddened/irritated stop using the CHG.  Do not shave (including legs and underarms) for at least 48 hours prior to first CHG shower. It is OK to shave your face.  Please follow these instructions  carefully.     Shower the NIGHT BEFORE SURGERY and the MORNING OF SURGERY with CHG Soap.   If you chose to wash your hair, wash your hair first as usual with your normal shampoo. After you shampoo, rinse your hair and body thoroughly to remove the shampoo.  Then [employer] and genitals (private parts) with your normal soap and rinse thoroughly to remove soap.  After that Use CHG Soap as you would any other liquid soap. You can apply CHG directly to the skin and wash gently with a scrungie or a clean washcloth.   Apply the CHG Soap to your body ONLY FROM THE NECK DOWN.  Do not use on  open wounds or open sores. Avoid contact with your eyes, ears, mouth and genitals (private parts). Wash Face and genitals (private parts)  with your normal soap.   Wash thoroughly, paying special attention to the area where your surgery will be performed.  Thoroughly rinse your body with warm water from the neck down.  DO NOT shower/wash with your normal soap after using and rinsing off the CHG Soap.  Pat yourself dry with a CLEAN TOWEL.  Wear CLEAN PAJAMAS to bed the night before surgery  Place CLEAN SHEETS on your bed the night before your surgery  DO NOT SLEEP WITH PETS.   Day of Surgery:  Take a shower with CHG soap. Wear Clean/Comfortable clothing the morning of surgery Do not apply any deodorants/lotions.   Remember to brush your teeth WITH YOUR REGULAR TOOTHPASTE.   Please read over the following fact sheets that you were given.

## 2020-11-14 NOTE — Progress Notes (Signed)
PCP - [name] MD Cardiologist - denies  PPM/ICD - denies Device Orders -  Rep Notified -   Chest x-ray -n/a  EKG - n/a Stress Test - denies ECHO - denies Cardiac Cath - denies  Sleep Study - denies CPAP -   Fasting Blood Sugar - n/a Checks Blood Sugar _____ times a day  Blood Thinner Instructions:n/a Aspirin Instructions:n/a  ERAS Protcol -clear liquids until 10am PRE-SURGERY Ensure or G2- no   COVID TEST- yes 11/14/20   Anesthesia review: no  Patient denies shortness of breath, fever, cough and chest pain at PAT appointment   All instructions explained to the patient, with a verbal understanding of the material. Patient agrees to go over the instructions while at home for a better understanding. Patient also instructed to self quarantine after being tested for COVID-19. The opportunity to ask questions was provided.

## 2020-11-18 ENCOUNTER — Encounter (HOSPITAL_COMMUNITY): Payer: Self-pay | Admitting: Neurological Surgery

## 2020-11-18 ENCOUNTER — Inpatient Hospital Stay (HOSPITAL_COMMUNITY): Payer: Medicare PPO

## 2020-11-18 ENCOUNTER — Encounter (HOSPITAL_COMMUNITY)
Admission: RE | Disposition: A | Discharge status: Home / Self Care | Payer: Self-pay | Source: Home / Self Care | Attending: Neurological Surgery

## 2020-11-18 ENCOUNTER — Inpatient Hospital Stay (HOSPITAL_COMMUNITY)
Admission: RE | Admit: 2020-11-18 | Discharge: 2020-11-19 | DRG: 454 | Disposition: A | Discharge status: Home / Self Care | Payer: Medicare PPO | Attending: Neurological Surgery | Admitting: Neurological Surgery

## 2020-11-18 ENCOUNTER — Inpatient Hospital Stay (HOSPITAL_COMMUNITY): Payer: Medicare PPO | Admitting: Vascular Surgery

## 2020-11-18 DIAGNOSIS — M4807 Spinal stenosis, lumbosacral region: Secondary | ICD-10-CM | POA: Diagnosis not present

## 2020-11-18 DIAGNOSIS — M5416 Radiculopathy, lumbar region: Secondary | ICD-10-CM | POA: Diagnosis present

## 2020-11-18 DIAGNOSIS — M4317 Spondylolisthesis, lumbosacral region: Secondary | ICD-10-CM | POA: Diagnosis not present

## 2020-11-18 DIAGNOSIS — Z87891 Personal history of nicotine dependence: Secondary | ICD-10-CM

## 2020-11-18 DIAGNOSIS — K219 Gastro-esophageal reflux disease without esophagitis: Secondary | ICD-10-CM | POA: Diagnosis present

## 2020-11-18 DIAGNOSIS — K589 Irritable bowel syndrome without diarrhea: Secondary | ICD-10-CM | POA: Diagnosis not present

## 2020-11-18 DIAGNOSIS — M4326 Fusion of spine, lumbar region: Secondary | ICD-10-CM | POA: Diagnosis not present

## 2020-11-18 DIAGNOSIS — C9141 Hairy cell leukemia, in remission: Secondary | ICD-10-CM | POA: Diagnosis present

## 2020-11-18 DIAGNOSIS — Z20822 Contact with and (suspected) exposure to covid-19: Secondary | ICD-10-CM | POA: Diagnosis present

## 2020-11-18 DIAGNOSIS — Z981 Arthrodesis status: Secondary | ICD-10-CM | POA: Diagnosis not present

## 2020-11-18 DIAGNOSIS — Z419 Encounter for procedure for purposes other than remedying health state, unspecified: Secondary | ICD-10-CM

## 2020-11-18 DIAGNOSIS — N400 Enlarged prostate without lower urinary tract symptoms: Secondary | ICD-10-CM | POA: Diagnosis not present

## 2020-11-18 HISTORY — PX: TRANSFORAMINAL LUMBAR INTERBODY FUSION W/ MIS 1 LEVEL: SHX6145

## 2020-11-18 LAB — ABO/RH: ABO/RH(D): A POS

## 2020-11-18 SURGERY — MINIMALLY INVASIVE (MIS) TRANSFORAMINAL LUMBAR INTERBODY FUSION (TLIF) 1 LEVEL
Anesthesia: General | Laterality: Right

## 2020-11-18 MED ORDER — ONDANSETRON HCL 4 MG/2ML IJ SOLN
INTRAMUSCULAR | Status: DC | PRN
  Administered 2020-11-18: 4 mg via INTRAVENOUS

## 2020-11-18 MED ORDER — BUPIVACAINE HCL (PF) 0.5 % IJ SOLN
INTRAMUSCULAR | Status: DC | PRN
  Administered 2020-11-18: 5 mL

## 2020-11-18 MED ORDER — MENTHOL 3 MG MT LOZG: 1.0000 | LOZENGE | OROMUCOSAL | Status: DC | PRN

## 2020-11-18 MED ORDER — OXYCODONE HCL 5 MG PO TABS
5.0000 mg | ORAL_TABLET | ORAL | Status: DC | PRN
  Administered 2020-11-18 – 2020-11-19 (×2): 5 mg via ORAL
  Filled 2020-11-18: qty 1

## 2020-11-18 MED ORDER — CYCLOBENZAPRINE HCL 10 MG PO TABS
10.0000 mg | ORAL_TABLET | Freq: Three times a day (TID) | ORAL | Status: DC | PRN
  Administered 2020-11-18: 10 mg via ORAL
  Filled 2020-11-18: qty 1

## 2020-11-18 MED ORDER — LIDOCAINE-EPINEPHRINE 1 %-1:100000 IJ SOLN
INTRAMUSCULAR | Status: AC
  Filled 2020-11-18: qty 1

## 2020-11-18 MED ORDER — VANCOMYCIN HCL IN DEXTROSE 1-5 GM/200ML-% IV SOLN: 1000.0000 mg | INTRAVENOUS | Status: AC

## 2020-11-18 MED ORDER — DOCUSATE SODIUM 100 MG PO CAPS
100.0000 mg | ORAL_CAPSULE | Freq: Two times a day (BID) | ORAL | Status: DC
  Administered 2020-11-18 – 2020-11-19 (×2): 100 mg via ORAL
  Filled 2020-11-18 (×2): qty 1

## 2020-11-18 MED ORDER — EPHEDRINE SULFATE-NACL 50-0.9 MG/10ML-% IV SOSY
PREFILLED_SYRINGE | INTRAVENOUS | Status: DC | PRN
  Administered 2020-11-18: 10 mg via INTRAVENOUS
  Administered 2020-11-18: 5 mg via INTRAVENOUS

## 2020-11-18 MED ORDER — ONDANSETRON HCL 4 MG/2ML IJ SOLN
4.0000 mg | Freq: Four times a day (QID) | INTRAMUSCULAR | Status: DC | PRN
  Administered 2020-11-18: 4 mg via INTRAVENOUS
  Filled 2020-11-18: qty 2

## 2020-11-18 MED ORDER — CHLORHEXIDINE GLUCONATE CLOTH 2 % EX PADS: 6.0000 | MEDICATED_PAD | Freq: Once | CUTANEOUS | Status: DC

## 2020-11-18 MED ORDER — ONDANSETRON HCL 4 MG/2ML IJ SOLN
INTRAMUSCULAR | Status: AC
  Filled 2020-11-18: qty 2

## 2020-11-18 MED ORDER — SUGAMMADEX SODIUM 200 MG/2ML IV SOLN
INTRAVENOUS | Status: DC | PRN
  Administered 2020-11-18: 200 mg via INTRAVENOUS

## 2020-11-18 MED ORDER — ONDANSETRON HCL 4 MG PO TABS: 4.0000 mg | ORAL_TABLET | Freq: Four times a day (QID) | ORAL | Status: DC | PRN

## 2020-11-18 MED ORDER — DEXAMETHASONE SODIUM PHOSPHATE 10 MG/ML IJ SOLN
INTRAMUSCULAR | Status: AC
  Filled 2020-11-18: qty 1

## 2020-11-18 MED ORDER — CHLORHEXIDINE GLUCONATE 0.12 % MT SOLN
15.0000 mL | Freq: Once | OROMUCOSAL | Status: AC
Start: 2020-11-18 — End: 2020-11-18

## 2020-11-18 MED ORDER — ACETAMINOPHEN 325 MG PO TABS
650.0000 mg | ORAL_TABLET | ORAL | Status: DC | PRN
  Administered 2020-11-18 – 2020-11-19 (×2): 650 mg via ORAL
  Filled 2020-11-18 (×2): qty 2

## 2020-11-18 MED ORDER — ROCURONIUM BROMIDE 10 MG/ML (PF) SYRINGE
PREFILLED_SYRINGE | INTRAVENOUS | Status: AC
  Filled 2020-11-18: qty 10

## 2020-11-18 MED ORDER — HYDROMORPHONE HCL 1 MG/ML IJ SOLN
0.2500 mg | INTRAMUSCULAR | Status: DC | PRN
  Administered 2020-11-18 (×2): 0.5 mg via INTRAVENOUS

## 2020-11-18 MED ORDER — SODIUM CHLORIDE 0.9% FLUSH: 3.0000 mL | INTRAVENOUS | Status: DC | PRN

## 2020-11-18 MED ORDER — ACETAMINOPHEN 500 MG PO TABS
1000.0000 mg | ORAL_TABLET | Freq: Once | ORAL | Status: AC
  Administered 2020-11-18: 1000 mg via ORAL
  Filled 2020-11-18: qty 2

## 2020-11-18 MED ORDER — THROMBIN (RECOMBINANT) 5000 UNITS EX SOLR
CUTANEOUS | Status: AC
  Filled 2020-11-18: qty 5000

## 2020-11-18 MED ORDER — DIPHENHYDRAMINE HCL 25 MG PO CAPS: 25.0000 mg | ORAL_CAPSULE | Freq: Four times a day (QID) | ORAL | Status: DC | PRN

## 2020-11-18 MED ORDER — FINASTERIDE 5 MG PO TABS
5.0000 mg | ORAL_TABLET | Freq: Every morning | ORAL | Status: DC
  Filled 2020-11-18: qty 1

## 2020-11-18 MED ORDER — MIDAZOLAM HCL 2 MG/2ML IJ SOLN
INTRAMUSCULAR | Status: DC | PRN
  Administered 2020-11-18: 2 mg via INTRAVENOUS

## 2020-11-18 MED ORDER — PHENYLEPHRINE HCL-NACL 20-0.9 MG/250ML-% IV SOLN: INTRAVENOUS | Status: DC | PRN

## 2020-11-18 MED ORDER — HYDROMORPHONE HCL 1 MG/ML IJ SOLN
INTRAMUSCULAR | Status: AC
  Filled 2020-11-18: qty 1

## 2020-11-18 MED ORDER — PSYLLIUM 95 % PO PACK: 1.0000 | PACK | Freq: Every day | ORAL | Status: DC | PRN

## 2020-11-18 MED ORDER — LACTATED RINGERS IV SOLN: INTRAVENOUS | Status: DC

## 2020-11-18 MED ORDER — PHENOL 1.4 % MT LIQD: 1.0000 | OROMUCOSAL | Status: DC | PRN

## 2020-11-18 MED ORDER — MIDAZOLAM HCL 2 MG/2ML IJ SOLN
INTRAMUSCULAR | Status: AC
  Filled 2020-11-18: qty 2

## 2020-11-18 MED ORDER — MIDAZOLAM HCL 2 MG/2ML IJ SOLN: 0.5000 mg | Freq: Once | INTRAMUSCULAR | Status: DC | PRN

## 2020-11-18 MED ORDER — TAMSULOSIN HCL 0.4 MG PO CAPS
0.4000 mg | ORAL_CAPSULE | Freq: Every day | ORAL | Status: DC
  Administered 2020-11-18: 0.4 mg via ORAL
  Filled 2020-11-18: qty 1

## 2020-11-18 MED ORDER — MEPERIDINE HCL 25 MG/ML IJ SOLN: 6.2500 mg | INTRAMUSCULAR | Status: DC | PRN

## 2020-11-18 MED ORDER — BUPIVACAINE HCL (PF) 0.5 % IJ SOLN
INTRAMUSCULAR | Status: AC
  Filled 2020-11-18: qty 30

## 2020-11-18 MED ORDER — OXYCODONE HCL 5 MG PO TABS
10.0000 mg | ORAL_TABLET | ORAL | Status: DC | PRN
  Filled 2020-11-18: qty 2

## 2020-11-18 MED ORDER — THROMBIN 5000 UNITS EX SOLR
OROMUCOSAL | Status: DC | PRN
  Administered 2020-11-18: 5 mL via TOPICAL

## 2020-11-18 MED ORDER — PHENYLEPHRINE HCL-NACL 20-0.9 MG/250ML-% IV SOLN
INTRAVENOUS | Status: DC | PRN
  Administered 2020-11-18: 40 ug/min via INTRAVENOUS

## 2020-11-18 MED ORDER — PROMETHAZINE HCL 25 MG/ML IJ SOLN: 6.2500 mg | INTRAMUSCULAR | Status: DC | PRN

## 2020-11-18 MED ORDER — ROCURONIUM BROMIDE 10 MG/ML (PF) SYRINGE
PREFILLED_SYRINGE | INTRAVENOUS | Status: DC | PRN
  Administered 2020-11-18: 30 mg via INTRAVENOUS
  Administered 2020-11-18: 70 mg via INTRAVENOUS

## 2020-11-18 MED ORDER — NAPHAZOLINE-GLYCERIN-ZINC SULF 0.012-0.2-0.25 % OP SOLN: 1.0000 [drp] | Freq: Every day | OPHTHALMIC | Status: DC | PRN

## 2020-11-18 MED ORDER — PROPOFOL 10 MG/ML IV BOLUS
INTRAVENOUS | Status: AC
  Filled 2020-11-18: qty 40

## 2020-11-18 MED ORDER — 0.9 % SODIUM CHLORIDE (POUR BTL) OPTIME
TOPICAL | Status: DC | PRN
  Administered 2020-11-18: 1000 mL

## 2020-11-18 MED ORDER — ORAL CARE MOUTH RINSE: 15.0000 mL | Freq: Once | OROMUCOSAL | Status: AC

## 2020-11-18 MED ORDER — OXYCODONE HCL 5 MG PO TABS: 5.0000 mg | ORAL_TABLET | Freq: Once | ORAL | Status: DC | PRN

## 2020-11-18 MED ORDER — LIDOCAINE 2% (20 MG/ML) 5 ML SYRINGE
INTRAMUSCULAR | Status: AC
  Filled 2020-11-18: qty 5

## 2020-11-18 MED ORDER — LIDOCAINE 2% (20 MG/ML) 5 ML SYRINGE
INTRAMUSCULAR | Status: DC | PRN
  Administered 2020-11-18: 20 mg via INTRAVENOUS

## 2020-11-18 MED ORDER — VANCOMYCIN HCL IN DEXTROSE 1-5 GM/200ML-% IV SOLN
INTRAVENOUS | Status: AC
  Administered 2020-11-18: 1000 mg via INTRAVENOUS
  Filled 2020-11-18: qty 200

## 2020-11-18 MED ORDER — FENTANYL CITRATE (PF) 250 MCG/5ML IJ SOLN
INTRAMUSCULAR | Status: AC
  Filled 2020-11-18: qty 5

## 2020-11-18 MED ORDER — SODIUM CHLORIDE 0.9% FLUSH: 3.0000 mL | Freq: Two times a day (BID) | INTRAVENOUS | Status: DC

## 2020-11-18 MED ORDER — FENTANYL CITRATE (PF) 100 MCG/2ML IJ SOLN
INTRAMUSCULAR | Status: DC | PRN
  Administered 2020-11-18: 250 ug via INTRAVENOUS

## 2020-11-18 MED ORDER — HYDROMORPHONE HCL 1 MG/ML IJ SOLN
1.0000 mg | INTRAMUSCULAR | Status: DC | PRN
  Administered 2020-11-18: 1 mg via INTRAVENOUS
  Filled 2020-11-18: qty 1

## 2020-11-18 MED ORDER — LIDOCAINE-EPINEPHRINE 1 %-1:100000 IJ SOLN
INTRAMUSCULAR | Status: DC | PRN
  Administered 2020-11-18: 5 mL

## 2020-11-18 MED ORDER — ACETAMINOPHEN 650 MG RE SUPP: 650.0000 mg | RECTAL | Status: DC | PRN

## 2020-11-18 MED ORDER — POLYETHYLENE GLYCOL 3350 17 G PO PACK: 17.0000 g | PACK | Freq: Every day | ORAL | Status: DC | PRN

## 2020-11-18 MED ORDER — VANCOMYCIN HCL IN DEXTROSE 1-5 GM/200ML-% IV SOLN
1000.0000 mg | Freq: Once | INTRAVENOUS | Status: AC
  Administered 2020-11-18: 1000 mg via INTRAVENOUS
  Filled 2020-11-18: qty 200

## 2020-11-18 MED ORDER — OXYCODONE HCL 5 MG/5ML PO SOLN: 5.0000 mg | Freq: Once | ORAL | Status: DC | PRN

## 2020-11-18 MED ORDER — SODIUM CHLORIDE 0.9 % IV SOLN: 250.0000 mL | INTRAVENOUS | Status: DC

## 2020-11-18 MED ORDER — THROMBIN 5000 UNITS EX SOLR
CUTANEOUS | Status: AC
  Filled 2020-11-18: qty 5000

## 2020-11-18 MED ORDER — CHLORHEXIDINE GLUCONATE 0.12 % MT SOLN
OROMUCOSAL | Status: AC
  Administered 2020-11-18: 15 mL via OROMUCOSAL
  Filled 2020-11-18: qty 15

## 2020-11-18 MED ORDER — DEXAMETHASONE SODIUM PHOSPHATE 10 MG/ML IJ SOLN
INTRAMUSCULAR | Status: DC | PRN
  Administered 2020-11-18: 10 mg via INTRAVENOUS

## 2020-11-18 MED ORDER — PROPOFOL 10 MG/ML IV BOLUS
INTRAVENOUS | Status: DC | PRN
  Administered 2020-11-18: 100 mg via INTRAVENOUS

## 2020-11-18 SURGICAL SUPPLY — 62 items
BAG COUNTER SPONGE SURGICOUNT (BAG) ×4 IMPLANT
BAND RUBBER #18 3X1/16 STRL (MISCELLANEOUS) ×4 IMPLANT
BASKET BONE COLLECTION (BASKET) ×2 IMPLANT
BLADE CLIPPER SURG (BLADE) IMPLANT
BLADE SURG 11 STRL SS (BLADE) ×2 IMPLANT
BUR 2.5 MTCH HD 16 (BUR) ×2 IMPLANT
BUR MATCHSTICK NEURO 3.0 LAGG (BURR) ×2 IMPLANT
BUR PRECISION FLUTE 5.0 (BURR) IMPLANT
BUR PRECISION MATCH 3.0 13 (BURR) ×2 IMPLANT
CAGE INTERBODY PL SHT 7X22.5 (Plate) ×2 IMPLANT
CNTNR URN SCR LID CUP LEK RST (MISCELLANEOUS) ×1 IMPLANT
CONT SPEC 4OZ STRL OR WHT (MISCELLANEOUS) ×2
COVER BACK TABLE 60X90IN (DRAPES) ×2 IMPLANT
DECANTER SPIKE VIAL GLASS SM (MISCELLANEOUS) IMPLANT
DERMABOND ADVANCED (GAUZE/BANDAGES/DRESSINGS) ×1
DERMABOND ADVANCED .7 DNX12 (GAUZE/BANDAGES/DRESSINGS) ×1 IMPLANT
DRAPE C-ARM 42X72 X-RAY (DRAPES) ×2 IMPLANT
DRAPE C-ARMOR (DRAPES) ×2 IMPLANT
DRAPE LAPAROTOMY 100X72X124 (DRAPES) ×2 IMPLANT
DRAPE MICROSCOPE LEICA (MISCELLANEOUS) ×2 IMPLANT
DRAPE SURG 17X23 STRL (DRAPES) ×4 IMPLANT
ELECT BLADE 6.5 EXT (BLADE) ×2 IMPLANT
ELECT REM PT RETURN 9FT ADLT (ELECTROSURGICAL) ×2
ELECTRODE REM PT RTRN 9FT ADLT (ELECTROSURGICAL) ×1 IMPLANT
EXTENDER TAB GUIDE SV 5.5/6.0 (INSTRUMENTS) ×16 IMPLANT
GAUZE 4X4 16PLY ~~LOC~~+RFID DBL (SPONGE) ×2 IMPLANT
GAUZE SPONGE 4X4 12PLY STRL (GAUZE/BANDAGES/DRESSINGS) ×2 IMPLANT
GLOVE SURG LTX SZ7.5 (GLOVE) ×2 IMPLANT
GLOVE SURG POLYISO LF SZ6.5 (GLOVE) ×2 IMPLANT
GLOVE SURG UNDER POLY LF SZ7 (GLOVE) ×2 IMPLANT
GLOVE SURG UNDER POLY LF SZ7.5 (GLOVE) ×2 IMPLANT
GOWN STRL REUS W/ TWL LRG LVL3 (GOWN DISPOSABLE) ×1 IMPLANT
GOWN STRL REUS W/ TWL XL LVL3 (GOWN DISPOSABLE) IMPLANT
GOWN STRL REUS W/TWL 2XL LVL3 (GOWN DISPOSABLE) ×2 IMPLANT
GOWN STRL REUS W/TWL LRG LVL3 (GOWN DISPOSABLE) ×2
GOWN STRL REUS W/TWL XL LVL3 (GOWN DISPOSABLE)
GUIDEWIRE BLUNT NT 450 (WIRE) ×8 IMPLANT
HEMOSTAT POWDER KIT SURGIFOAM (HEMOSTASIS) ×2 IMPLANT
KIT BASIN OR (CUSTOM PROCEDURE TRAY) ×2 IMPLANT
KIT POSITION SURG JACKSON T1 (MISCELLANEOUS) ×2 IMPLANT
KIT TURNOVER KIT B (KITS) ×2 IMPLANT
NEEDLE HYPO 18GX1.5 BLUNT FILL (NEEDLE) IMPLANT
NEEDLE HYPO 22GX1.5 SAFETY (NEEDLE) ×2 IMPLANT
NEEDLE SPNL 18GX3.5 QUINCKE PK (NEEDLE) ×2 IMPLANT
NS IRRIG 1000ML POUR BTL (IV SOLUTION) ×2 IMPLANT
PACK LAMINECTOMY NEURO (CUSTOM PROCEDURE TRAY) ×2 IMPLANT
PAD ARMBOARD 7.5X6 YLW CONV (MISCELLANEOUS) ×4 IMPLANT
PUTTY DBM GRAFTON 5CC (Putty) ×2 IMPLANT
ROD PERC CCM 5.5X35 (Rod) ×4 IMPLANT
SCREW MAS VG 5.5X7.5X40 (Screw) ×4 IMPLANT
SCREW MAS VOYAGER 6.5X40 (Screw) ×4 IMPLANT
SCREW SET 5.5/6.0MM SOLERA (Screw) ×8 IMPLANT
SPONGE T-LAP 4X18 ~~LOC~~+RFID (SPONGE) ×2 IMPLANT
SUT MNCRL AB 3-0 PS2 18 (SUTURE) ×2 IMPLANT
SUT VIC AB 0 CT1 18XCR BRD8 (SUTURE) ×1 IMPLANT
SUT VIC AB 0 CT1 8-18 (SUTURE) ×2
SUT VIC AB 2-0 CP2 18 (SUTURE) ×2 IMPLANT
SYR 3ML LL SCALE MARK (SYRINGE) IMPLANT
TOWEL GREEN STERILE (TOWEL DISPOSABLE) ×2 IMPLANT
TOWEL GREEN STERILE FF (TOWEL DISPOSABLE) ×2 IMPLANT
TRAY FOLEY MTR SLVR 16FR STAT (SET/KITS/TRAYS/PACK) ×2 IMPLANT
WATER STERILE IRR 1000ML POUR (IV SOLUTION) ×2 IMPLANT

## 2020-11-18 NOTE — Anesthesia Procedure Notes (Signed)
Procedure Name: Intubation Date/Time: 11/18/2020 4:11 PM Performed by: Barrington Ellison, CRNA Pre-anesthesia Checklist: Patient identified, Emergency Drugs available, Suction available and Patient being monitored Patient Re-evaluated:Patient Re-evaluated prior to induction Oxygen Delivery Method: Circle System Utilized Preoxygenation: Pre-oxygenation with 100% oxygen Induction Type: IV induction Ventilation: Mask ventilation without difficulty Laryngoscope Size: Mac and 4 Grade View: Grade II Tube type: Oral Tube size: 7.5 mm Number of attempts: 1 Airway Equipment and Method: Stylet and Oral airway Placement Confirmation: ETT inserted through vocal cords under direct vision, positive ETCO2 and breath sounds checked- equal and bilateral Secured at: 24 cm Tube secured with: Tape Dental Injury: Teeth and Oropharynx as per pre-operative assessment  Comments: Performed by Smitty Pluck, SRNA

## 2020-11-18 NOTE — Anesthesia Postprocedure Evaluation (Signed)
Anesthesia Post Note  Patient: Cameron Wu  Procedure(s) Performed: Right Lumbar Five-Sacral One Minimally invasive transforaminal lumbar interbody fusion (Right)     Patient location during evaluation: PACU Anesthesia Type: General Level of consciousness: awake and alert Pain management: pain level controlled Vital Signs Assessment: post-procedure vital signs reviewed and stable Respiratory status: spontaneous breathing, nonlabored ventilation, respiratory function stable and patient connected to nasal cannula oxygen Cardiovascular status: blood pressure returned to baseline and stable Postop Assessment: no apparent nausea or vomiting Anesthetic complications: no   No notable events documented.  Last Vitals:  Vitals:   11/18/20 2040 11/18/20 2057  BP: 128/77 129/78  Pulse: 77 80  Resp: 18 18  Temp: 36.4 C (!) 36.4 C  SpO2: 100% 96%    Last Pain:  Vitals:   11/18/20 2156  TempSrc:   PainSc: [place]

## 2020-11-18 NOTE — Op Note (Signed)
PATIENT: Cameron Wu  DAY OF SURGERY: 11/18/20   PRE-OPERATIVE DIAGNOSIS:  Lumbar radiculopathy   POST-OPERATIVE DIAGNOSIS:  Same   PROCEDURE:  Right L5-S1 minimally invasive transforaminal lumbar interbody fusion with bilateral L5-S1 pedicle screw placement   SURGEON:  Surgeon(s) and Role:    Judith Part, MD - Primary   ANESTHESIA: ETGA   BRIEF HISTORY: This is a 70 year old man who presented with right lower extremity radiculopathy in an L5 distribution. The patient was found to have right L5-S1 foraminal stenosis with an L5-S1 anterolisthesis due to pars defects as well as a pedicle abnormality at L5-S1. His symptoms were not controlled with non-surgical treatment measures, I therefore recommended a right L5-S1 MIS TLIF. This was discussed with the patient as well as risks, benefits, and alternatives and wished to proceed with surgery.   OPERATIVE DETAIL:  The patient was taken to the operating room and placed on the OR table in the prone position. A formal time out was performed with two patient identifiers and confirmed the operative site. Anesthesia was induced by the anesthesia team. The operative site was marked, hair was clipped with surgical clippers, the area was then prepped and draped in a sterile fashion.   Fluoroscopy was used to localize the surgical level. The pedicles were marked and used to create skin incisions bilaterally. With fluoro guidance, Jamshidi needles were used to guide K-wires into the bilateral L5 and S1 pedicles. The K wires were then secured with hemostats and attention turned to the TLIF.  A [employer] tube was then docked to the right L5-S1 facet through the same incision using fluoroscopy. A right L5-S1 facetectomy was performed and the right L5 nerve root was decompressed along its entire course. As expected, the anatomy was atypical with pars defects and multiple bony abnormalities. The disc space was identified, incised, and a discectomy was  performed in the standard fashion. The discectomy was difficult since, as expected, the root was completely covering the disc space. I therefore drilled off part of the superior S1 body to gain access to the disc space and pull it caudally to get started. After removing a fair amount of disc, I was able to approach it in a line more inline with the disc space.  The endplates were prepped, bone graft was packed into the disc space, and a lordotic expandable titanium cage ([employer]) was packed with autograft and placed into the disc space with fluoroscopic confirmation. The tube was removed and hemostasis was obtained during its removal.   Using the previously placed K wires, a tap and then screw with tower were placed bilaterally at L5 and S1. A rod was sized and introduced on both sides, confirmed with fluoroscopy, then final tightened. Hemostasis was again confirmed for both incisions, they were copiously irrigated, and then closed in layers.    EBL:  73m   DRAINS: none   SPECIMENS: none   TJudith Part MD 11/18/20 3:57 PM

## 2020-11-18 NOTE — Discharge Instructions (Signed)
Discharge Instructions ° °No restriction in activities, slowly increase your activity back to normal.  ° °Your incision is closed with dermabond (purple glue). This will naturally fall off over the next 1-2 weeks.  ° °Okay to shower on the day of discharge. Use regular soap and water and try to be gentle when cleaning your incision.  ° °Follow up with Dr. Raivyn Kabler in 2 weeks after discharge. If you do not already have a discharge appointment, please call his office at [phone] to schedule a follow up appointment. If you have any concerns or questions, please call the office and let us know. °

## 2020-11-18 NOTE — Transfer of Care (Signed)
Immediate Anesthesia Transfer of Care Note  Patient: Cameron Wu  Procedure(s) Performed: Right Lumbar Five-Sacral One Minimally invasive transforaminal lumbar interbody fusion (Right)  Patient Location: PACU  Anesthesia Type:General  Level of Consciousness: awake, alert  and oriented  Airway & Oxygen Therapy: Patient Spontanous Breathing  Post-op Assessment: Report given to RN and Post -op Vital signs reviewed and stable  Post vital signs: Reviewed and stable  Last Vitals:  Vitals Value Taken Time  BP    Temp    Pulse 86 11/18/20 1953  Resp 20 11/18/20 1953  SpO2 95 % 11/18/20 1953  Vitals shown include unvalidated device data.  Last Pain:  Vitals:   11/18/20 1148  TempSrc:   PainSc: 0-No pain         Complications: No notable events documented.

## 2020-11-18 NOTE — Anesthesia Preprocedure Evaluation (Addendum)
Anesthesia Evaluation  Patient identified by MRN, date of birth, ID band Patient awake    Reviewed: Allergy & Precautions, NPO status , Patient's Chart, lab work & pertinent test results  History of Anesthesia Complications Negative for: history of anesthetic complications  Airway Mallampati: I  TM Distance: >3 FB Neck ROM: Full    Dental  (+) Dental Advisory Given   Pulmonary former smoker,  11/14/2020 SARS coronavirus NEG   breath sounds clear to auscultation       Cardiovascular negative cardio ROS   Rhythm:Regular Rate:Normal     Neuro/Psych negative neurological ROS     GI/Hepatic Neg liver ROS, GERD  Controlled,  Endo/Other  negative endocrine ROS  Renal/GU negative Renal ROS     Musculoskeletal   Abdominal   Peds  Hematology Hairy cell leukemia: remission   Anesthesia Other Findings   Reproductive/Obstetrics                            Anesthesia Physical Anesthesia Plan  ASA: 3  Anesthesia Plan: General   Post-op Pain Management:    Induction: Intravenous  PONV Risk Score and Plan: 2 and Ondansetron and Dexamethasone  Airway Management Planned: Oral ETT  Additional Equipment: None  Intra-op Plan:   Post-operative Plan: Extubation in OR  Informed Consent: I have reviewed the patients History and Physical, chart, labs and discussed the procedure including the risks, benefits and alternatives for the proposed anesthesia with the patient or authorized representative who has indicated his/her understanding and acceptance.     Dental advisory given  Plan Discussed with: CRNA and Surgeon  Anesthesia Plan Comments:        Anesthesia Quick Evaluation

## 2020-11-18 NOTE — H&P (Signed)
Surgical H&P Update  HPI: 70 y.o. man with right L5 radiculopathy secondary to right L5-S1 foraminal stenosis with L5-S1 anterolisthesis. No changes in health since he was last seen. Still having pain and numbness and wishes to proceed with surgery.  PMHx:  Past Medical History:  Diagnosis Date   Abnormal liver function tests 05/03/2011   BPH (benign prostatic hypertrophy) 05/03/2011   Elevated transaminase level    GERD (gastroesophageal reflux disease)    Hairy cell leukemia    Hairy cell leukemia    1992   Hairy cell leukemia, in remission ([place]) 05/03/2011   IBS (irritable bowel syndrome) 05/03/2011   Irritable bowel syndrome    2 yrs ago, states he had inflammation around anus. Biopsy: inclusive   FamHx: History reviewed. No pertinent family history. SocHx:  reports that he quit smoking about 14 years ago. His smoking use included cigarettes. He has never used smokeless tobacco. He reports current alcohol use of about 4.0 standard drinks of alcohol per week. He reports that he does not use drugs.  Physical Exam: AOx3, PERRL, FS, TM  Strength 5/5 x4, SILTx4 except right L5 numbness  Assesment/Plan: 70 y.o. man with right L5 radiculopathy, here for right L5-S1 MIS TLIF. Risks, benefits, and alternatives discussed and the patient would like to continue with surgery.  -OR today -3C post-op  Judith Part, MD 11/18/20 3:14 PM

## 2020-11-19 ENCOUNTER — Encounter (HOSPITAL_COMMUNITY): Payer: Self-pay | Admitting: Neurological Surgery

## 2020-11-19 MED ORDER — OXYCODONE HCL 5 MG PO TABS: 5.0000 mg | ORAL_TABLET | ORAL | 0 refills | Status: DC | PRN

## 2020-11-19 MED ORDER — CYCLOBENZAPRINE HCL 10 MG PO TABS
10.0000 mg | ORAL_TABLET | Freq: Three times a day (TID) | ORAL | 0 refills | Status: DC | PRN
Start: 2020-11-19 — End: 2021-01-27

## 2020-11-19 NOTE — Evaluation (Signed)
Occupational Therapy Evaluation/Discharge  Patient Details Name: Cameron Wu MRN: [id number] DOB: 1950/06/17 Today's Date: 11/19/2020    History of Present Illness 70 y.o. male s/p Right  L5-S1 minimally invasive transforaminal Lumbar interbody fusion. PMH significant for abnorma; liver function tests, BPH, elevated transaminase level, GERD, hairy cell leukemia, and IBS.   Clinical Impression   PTA, pt was independent and lived with his wife. Pt performing UB ADLs with supervision/set-up and LB ADLs with Min Guard A. Pt perfroming functional mobility using RW with Min Guard A. Pt educated and demonstrating use of compensatory techniques for LB dressing, oral care, bed mobility, toileting, and tub/shower transfers. Pt requiring min verbal cues for adherance to back precautions during session. All questions answered. Recommend discharge home with no follow up OT. Re-consult if change in status. OT to sign off.     Follow Up Recommendations  No OT follow up    Equipment Recommendations  None recommended by OT    Recommendations for Other Services       Precautions / Restrictions Precautions Precautions: Back Precaution Booklet Issued: Yes (comment) Precaution Comments: All education reviewed, pt verbalizing understanding. Handout provided. Required Braces or Orthoses:  (no brace needed) Restrictions Weight Bearing Restrictions: No      Mobility Bed Mobility Overal bed mobility: Needs Assistance Bed Mobility: Rolling;Sidelying to Sit Rolling: Supervision Sidelying to sit: Supervision       General bed mobility comments: Requiring verbal cues for proper use of log roll technique.    Transfers Overall transfer level: Needs assistance Equipment used: Rolling walker (2 wheeled) Transfers: Sit to/from Stand Sit to Stand: Min guard         General transfer comment: Min Guard A for safety    Balance Overall balance assessment: Needs assistance Sitting-balance  support: No upper extremity supported;Feet supported Sitting balance-Leahy Scale: Good Sitting balance - Comments: donning LB dressing sitting EOB   Standing balance support: Bilateral upper extremity supported;During functional activity Standing balance-Leahy Scale: Fair Standing balance comment: Min Guard A for safety with RW                           ADL either performed or assessed with clinical judgement   ADL Overall ADL's : Needs assistance/impaired Eating/Feeding: Set up;Sitting   Grooming: Supervision/safety;Standing   Upper Body Bathing: Supervision/ safety;Standing   Lower Body Bathing: Sit to/from stand;Min guard Lower Body Bathing Details (indicate cue type and reason): Pt educated regarding use of shower seat for increased safety during bathing. Despite education, pt reporting he does not want a shower seat and plans to bathe his lower body siting on the toilet rather than in the shower. Upper Body Dressing : Set up;Sitting   Lower Body Dressing: Set up;Min guard;Sit to/from stand Lower Body Dressing Details (indicate cue type and reason): Min Guard A for safety Toilet Transfer: Retail buyer and Hygiene: Min guard;Sit to/from stand   Tub/ Shower Transfer: Tub transfer;Min guard;Ambulation   Functional mobility during ADLs: Min guard;Rolling walker General ADL Comments: Pt performing UB ADLs with supervision/set-up and LB ADLs with Min Guard A. Pt perfroming functional mobility with RW with Min Guard A. Pt educated and demonstrating use of compensatory techniques for LB dressing, oral care, bed mobility, toileting, and tub/shower transfers. Pt requiring min verbal cues for adherance to back precautions throughout session.     Vision Baseline Vision/History: Wears glasses Wears Glasses: At all times Patient Visual  Report: No change from baseline Vision Assessment?: No apparent visual deficits Additional  Comments: Pt reporting s/p cataract surgery     Perception Perception Perception Tested?: No Comments: no apparent difficulty with perception.   Praxis Praxis Praxis tested?: Not tested Praxis-Other Comments: no apparent difficulty with motor planning.    Pertinent Vitals/Pain Pain Assessment: Faces Faces Pain Scale: Hurts a little bit Pain Location: operative site Pain Descriptors / Indicators: Guarding;Discomfort;Sore;Operative site guarding Pain Intervention(s): Limited activity within patient's tolerance;Monitored during session     Hand Dominance     Extremity/Trunk Assessment Upper Extremity Assessment Upper Extremity Assessment: Overall WFL for tasks assessed   Lower Extremity Assessment Lower Extremity Assessment: Defer to PT evaluation   Cervical / Trunk Assessment Cervical / Trunk Assessment: Other exceptions Cervical / Trunk Exceptions: s/p Lumbar spinal surgery   Communication Communication Communication: No difficulties   Cognition Arousal/Alertness: Awake/alert Behavior During Therapy: WFL for tasks assessed/performed Overall Cognitive Status: Within Functional Limits for tasks assessed                                 General Comments: Pt pleasant and conversational throughout. Pt verbalizing understanding of all education provided. Pt requiring min verbal cues for adherance to back precautions during dressinng.   General Comments  Pt verbalizing understanding of all education provided.    Exercises     Shoulder Instructions      Home Living Family/patient expects to be discharged to:: Private residence Living Arrangements: Spouse/significant other Available Help at Discharge: Family Type of Home: Other(Comment) (town home) Home Access: Stairs to enter Technical brewer of Steps: 5 Entrance Stairs-Rails: Right;Left Home Layout: Two level Alternate Level Stairs-Number of Steps: 13   Bathroom Shower/Tub: Animal nutritionist: Standard     Home Equipment: Adaptive equipment;Grab bars - tub/shower Adaptive Equipment: Reacher        Prior Functioning/Environment Level of Independence: Independent        Comments: Pt reporting independence with ADL/IADL. Pt was driving and mowing yards for work        OT Problem List: Decreased strength;Decreased activity tolerance;Impaired balance (sitting and/or standing);Decreased knowledge of precautions;Pain      OT Treatment/Interventions:      OT Goals(Current goals can be found in the care plan section) Acute Rehab OT Goals Patient Stated Goal: go home OT Goal Formulation: With patient  OT Frequency:     Barriers to D/C:            Co-evaluation              AM-PAC OT "6 Clicks" Daily Activity     Outcome Measure Help from another person eating meals?: None Help from another person taking care of personal grooming?: A Little Help from another person toileting, which includes using toliet, bedpan, or urinal?: A Little Help from another person bathing (including washing, rinsing, drying)?: A Little Help from another person to put on and taking off regular upper body clothing?: A Little Help from another person to put on and taking off regular lower body clothing?: A Little 6 Click Score: 19   End of Session Equipment Utilized During Treatment: Gait belt;Rolling walker Nurse Communication: Mobility status  Activity Tolerance: Patient tolerated treatment well Patient left: in bed;with call bell/phone within reach  OT Visit Diagnosis: Unsteadiness on feet (R26.81);Muscle weakness (generalized) (M62.81);Pain Pain - part of body:  (operative site)  [id number] OT Time Calculation (min): 27 min Charges:  OT General Charges $OT Visit: 1 Visit OT Evaluation $OT Eval Low Complexity: 1 Low OT Treatments $Self Care/Home Management : 8-22 mins  Shanda Howells, OTDS  Shanda Howells 11/19/2020, 8:57 AM

## 2020-11-19 NOTE — Progress Notes (Signed)
Patient was transported via wheelchair by volunteer for discharge home; in no acute distress nor complaints of pain nor discomfort; room was checked and accounted for all his belongings; discharge instructions given to patient by RN and he verbalized understanding on the instructions given.

## 2020-11-19 NOTE — Progress Notes (Signed)
Neurosurgery Service Progress Note  Subjective: No acute events overnight, no radicular pain, numbness resolved, he's very happy   Objective: Vitals:   11/18/20 2057 11/18/20 2318 11/19/20 0434 11/19/20 0758  BP: 129/78 137/84 (!) 143/82 123/74  Pulse: 80 85 76 80  Resp: '18 18 18 17  '$ Temp: (!) 97.5 F (36.4 C)  (!) 97.5 F (36.4 C) 98.4 F (36.9 C)  TempSrc: Oral  Oral Oral  SpO2: 96% 99% 98% 100%  Weight:      Height:        Physical Exam: Strength 5/5 x4, SILTx4  Assessment & Plan: 70 y.o. man s/p L5-S1 MIS TLIF, recovering well.  -discharge home today  Judith Part  11/19/20 8:55 AM

## 2020-11-19 NOTE — Evaluation (Signed)
Physical Therapy Evaluation and Discharge Patient Details Name: Cameron Wu MRN: [id number] DOB: Mar 11, 1951 Today's Date: 11/19/2020   History of Present Illness  Pt is a 70 y/o male s/p Right L5-S1 minimally invasive TLIF on 11/18/2020. PMH significant for hairy cell leukemia.  Clinical Impression  Patient evaluated by Physical Therapy with no further acute PT needs identified. All education has been completed and the patient has no further questions. Pt was able to demonstrate transfers and ambulation with gross supervision for safety by end of session. Pt was educated on precautions, appropriate activity progression, and car transfer. See below for any follow-up Physical Therapy or equipment needs. PT is signing off. Thank you for this referral.     Follow Up Recommendations No PT follow up;Supervision for mobility/OOB    Equipment Recommendations  Rolling walker with 5" wheels    Recommendations for Other Services       Precautions / Restrictions Precautions Precautions: Back Precaution Booklet Issued: Yes (comment) Precaution Comments: Reviewed handout and pt was cued for precautions throughout functional mobility. Required Braces or Orthoses:  (no brace needed) Restrictions Weight Bearing Restrictions: No      Mobility  Bed Mobility               General bed mobility comments: Pt received sitting up EOB. Verbally reviewed log rol technique.    Transfers Overall transfer level: Needs assistance Equipment used: Rolling walker (2 wheeled) Transfers: Sit to/from Stand Sit to Stand: Supervision         General transfer comment: Supervision for safety as pt mildly guarded. VC's for proper hand placement on seated surface for safety.  Ambulation/Gait Ambulation/Gait assistance: Min guard;Supervision Gait Distance (Feet): 400 Feet Assistive device: Rolling walker (2 wheeled) Gait Pattern/deviations: Step-through pattern;Decreased stride length;Trunk  flexed Gait velocity: Decreased Gait velocity interpretation: 1.31 - 2.62 ft/sec, indicative of limited community ambulator General Gait Details: VC's for closer walker proximity. Good posture throughout.  Stairs Stairs: Yes Stairs assistance: Min guard Stair Management: One rail Right;Step to pattern;Forwards Number of Stairs: 10 General stair comments: VC's for sequencing and general safety. No unsteadiness or LOB noted.  Wheelchair Mobility    Modified Rankin (Stroke Patients Only)       Balance Overall balance assessment: Needs assistance Sitting-balance support: No upper extremity supported;Feet supported Sitting balance-Leahy Scale: Fair     Standing balance support: Bilateral upper extremity supported;During functional activity Standing balance-Leahy Scale: Fair Standing balance comment: Feel he could manage without UE support, however utilizing RW for comfort                             Pertinent Vitals/Pain Pain Assessment: Faces Faces Pain Scale: Hurts a little bit Pain Location: operative site Pain Descriptors / Indicators: Guarding;Discomfort;Sore;Operative site guarding Pain Intervention(s): Limited activity within patient's tolerance;Monitored during session;Repositioned    Home Living Family/patient expects to be discharged to:: Private residence Living Arrangements: Spouse/significant other Available Help at Discharge: Family Type of Home: Other(Comment) (town home) Home Access: Stairs to enter Entrance Stairs-Rails: Psychiatric nurse of Steps: 5 Home Layout: Two level Home Equipment: Adaptive equipment;Grab bars - tub/shower      Prior Function Level of Independence: Independent         Comments: Pt reporting independence with ADL/IADL. Pt was driving and mowing yards for work     Hand Dominance        Extremity/Trunk Assessment   Upper Extremity Assessment Upper Extremity Assessment:  Defer to OT  evaluation    Lower Extremity Assessment Lower Extremity Assessment: Overall WFL for tasks assessed (Mildly weak)    Cervical / Trunk Assessment Cervical / Trunk Assessment: Other exceptions Cervical / Trunk Exceptions: s/p Lumbar spinal surgery  Communication   Communication: No difficulties  Cognition Arousal/Alertness: Awake/alert Behavior During Therapy: WFL for tasks assessed/performed Overall Cognitive Status: Within Functional Limits for tasks assessed                                        General Comments      Exercises     Assessment/Plan    PT Assessment Patent does not need any further PT services  PT Problem List         PT Treatment Interventions      PT Goals (Current goals can be found in the Care Plan section)  Acute Rehab PT Goals Patient Stated Goal: go home PT Goal Formulation: All assessment and education complete, DC therapy    Frequency     Barriers to discharge        Co-evaluation               AM-PAC PT "6 Clicks" Mobility  Outcome Measure Help needed turning from your back to your side while in a flat bed without using bedrails?: None Help needed moving from lying on your back to sitting on the side of a flat bed without using bedrails?: A Little Help needed moving to and from a bed to a chair (including a wheelchair)?: A Little Help needed standing up from a chair using your arms (e.g., wheelchair or bedside chair)?: A Little Help needed to walk in hospital room?: A Little Help needed climbing 3-5 steps with a railing? : A Little 6 Click Score: 19    End of Session Equipment Utilized During Treatment: Gait belt Activity Tolerance: Patient tolerated treatment well Patient left: in chair;with call bell/phone within reach Nurse Communication: Mobility status PT Visit Diagnosis: Unsteadiness on feet (R26.81);Pain Pain - part of body:  (Back)    Time: [id number] PT Time Calculation (min) (ACUTE ONLY): 25  min   Charges:   PT Evaluation $PT Eval Low Complexity: 1 Low PT Treatments $Gait Training: 8-22 mins        Rolinda Roan, PT, DPT Acute Rehabilitation Services Pager: [phone] Office: [phone]   Thelma Comp 11/19/2020, 12:43 PM

## 2020-11-19 NOTE — Discharge Summary (Signed)
Discharge Summary  Date of Admission: 11/18/2020  Date of Discharge: 11/19/20  Attending Physician: Emelda Brothers, MD  Hospital Course: Patient was admitted following an uncomplicated [id number] MIS TLIF. He was recovered in PACU and transferred to [facility]. His pain and numbness were resolved post-op, his hospital course was uncomplicated and the patient was discharged home on 11/19/20. He will follow up in clinic with me in 2 weeks.  Neurologic exam at discharge:  Strength 5/5 x4, SILTx4  Discharge diagnosis: Lumbar radiculopathy  Judith Part, MD 11/19/20 8:58 AM

## 2020-11-26 ENCOUNTER — Telehealth: Payer: Self-pay | Admitting: Urology

## 2020-11-26 DIAGNOSIS — R309 Painful micturition, unspecified: Secondary | ICD-10-CM | POA: Diagnosis not present

## 2020-11-26 NOTE — Telephone Encounter (Signed)
Scheduled OV next Tuesday with patient due to UTI symptoms.  September appt cancelled.

## 2020-11-26 NOTE — Telephone Encounter (Signed)
Pt called and said that he has a uti that is not clearing up. He's scheduled to see Dahlstedt 01/13/21. He wasn't sure if he can call him in a different medication. Please call patient when you can.

## 2020-11-27 ENCOUNTER — Other Ambulatory Visit: Payer: Self-pay

## 2020-12-02 ENCOUNTER — Encounter: Payer: Self-pay | Admitting: Urology

## 2020-12-02 ENCOUNTER — Ambulatory Visit (INDEPENDENT_AMBULATORY_CARE_PROVIDER_SITE_OTHER): Payer: Medicare PPO | Admitting: Urology

## 2020-12-02 ENCOUNTER — Other Ambulatory Visit: Payer: Self-pay

## 2020-12-02 VITALS — BP 149/70 | HR 85

## 2020-12-02 DIAGNOSIS — N401 Enlarged prostate with lower urinary tract symptoms: Secondary | ICD-10-CM | POA: Diagnosis not present

## 2020-12-02 DIAGNOSIS — R300 Dysuria: Secondary | ICD-10-CM | POA: Diagnosis not present

## 2020-12-02 DIAGNOSIS — R3129 Other microscopic hematuria: Secondary | ICD-10-CM | POA: Diagnosis not present

## 2020-12-02 DIAGNOSIS — N138 Other obstructive and reflux uropathy: Secondary | ICD-10-CM | POA: Diagnosis not present

## 2020-12-02 LAB — URINALYSIS, ROUTINE W REFLEX MICROSCOPIC
Bilirubin, UA: NEGATIVE
Glucose, UA: NEGATIVE
Ketones, UA: NEGATIVE
Leukocytes,UA: NEGATIVE
Nitrite, UA: NEGATIVE
Protein,UA: NEGATIVE
Specific Gravity, UA: 1.015 (ref 1.005–1.030)
Urobilinogen, Ur: 0.2 mg/dL (ref 0.2–1.0)
pH, UA: 6 (ref 5.0–7.5)

## 2020-12-02 LAB — MICROSCOPIC EXAMINATION
Bacteria, UA: NONE SEEN
Epithelial Cells (non renal): NONE SEEN /hpf (ref 0–10)
RBC, Urine: NONE SEEN /hpf (ref 0–2)
Renal Epithel, UA: NONE SEEN /hpf
WBC, UA: NONE SEEN /hpf (ref 0–5)

## 2020-12-02 MED ORDER — PHENAZOPYRIDINE HCL 200 MG PO TABS: 200.0000 mg | ORAL_TABLET | Freq: Three times a day (TID) | ORAL | 3 refills | Status: DC | PRN

## 2020-12-02 NOTE — Progress Notes (Signed)
H&P  Chief Complaint: Burning with urination, frequency and urgency  History of Present Illness: 70 year old male presents with a 48-monthhistory of sudden onset dysuria, frequency and urgency.  He does have a history of BPH and is on finasteride and tamsulosin.  Currently, IPSS 25, quality-of-life score 6.  He also has significant bothersome nocturia x3.  He has had no fever or chills.  He has been treated with 3 courses of antibiotics, the last 1 for 28 days without improvement.  He has had back surgery 2 weeks ago.  These symptoms started well before that.  He has no pneumaturia.  He has no current constipation.  Past Medical History:  Diagnosis Date   Abnormal liver function tests 05/03/2011   BPH (benign prostatic hypertrophy) 05/03/2011   Elevated transaminase level    GERD (gastroesophageal reflux disease)    Hairy cell leukemia    Hairy cell leukemia    1992   [place]   IBS (irritable bowel syndrome) 05/03/2011   Irritable bowel syndrome    2 yrs ago, states he had inflammation around anus. Biopsy: inclusive    Past Surgical History:  Procedure Laterality Date   BONE MARROW BIOPSY     2007   CATARACT EXTRACTION W/PHACO Right 08/15/2020   Procedure: CATARACT EXTRACTION PHACO AND INTRAOCULAR LENS PLACEMENT RIGHT EYE;  Surgeon: WBaruch Goldmann MD;  Location: [facility];  Service: Ophthalmology;  Laterality: Right;  right CDE=7.75   COLONOSCOPY  08   with polyps   COLONOSCOPY  09/30/2011   Procedure: COLONOSCOPY;  Surgeon: NRogene Houston MD;  Location: [facility];  Service: Endoscopy;  Laterality: N/A;  730   COLONOSCOPY N/A 01/11/2019   Procedure: COLONOSCOPY;  Surgeon: RRogene Houston MD;  Location: [facility];  Service: Endoscopy;  Laterality: N/A;  100-office move to 9:30am   HX COLON POLYPS     TONSILLECTOMY     childhood   TRANSFORAMINAL LUMBAR INTERBODY FUSION W/ MIS 1 LEVEL Right 11/18/2020   Procedure: Right Lumbar  Five-Sacral One Minimally invasive transforaminal lumbar interbody fusion;  Surgeon: OJudith Part MD;  [place]  Service: Neurosurgery;  Laterality: Right;    Home Medications:  Allergies as of 12/02/2020       Reactions   Penicillins Swelling   Did it involve swelling of the face/tongue/throat, SOB, or low BP? No Did it involve sudden or severe rash/hives, skin peeling, or any reaction on the inside of your mouth or nose? No Did you need to seek medical attention at a hospital or doctor's office? Was already inpatient at the time When did it last happen?      2005 If all above answers are "NO", may proceed with cephalosporin use.        Medication List        Accurate as of December 02, 2020  3:33 PM. If you have any questions, ask your nurse or doctor.          CHARCOAL ACTIVATED PO Take 520 mg by mouth daily.   Clear Eyes Seasonal Relief 0.012-0.2-0.25 % Soln Generic drug: Naphazoline-Glycerin-Zinc Sulf Place 1 drop into both eyes daily as needed (allergies).   cyclobenzaprine 10 MG tablet Commonly known as: FLEXERIL Take 1 tablet (10 mg total) by mouth 3 (three) times daily as needed for muscle spasms.   diphenhydrAMINE 25 mg capsule Commonly known as: BENADRYL Take 25-50 mg by mouth every 6 (six) hours as needed for allergies  or sleep.   finasteride 5 MG tablet Commonly known as: PROSCAR Take 1 tablet (5 mg total) by mouth every morning.   FISH OIL OMEGA-3 PO Take 1,000 mg by mouth daily. 300 mg omega 3   GINKOBA PO Take 120 mg by mouth daily.   ibuprofen 200 MG tablet Commonly known as: ADVIL Take 400 mg by mouth every 6 (six) hours as needed (Inflammation).   Magnesium 500 MG Caps Take 500 mg by mouth daily.   oxyCODONE 5 MG immediate release tablet Commonly known as: Oxy IR/ROXICODONE Take 1 tablet (5 mg total) by mouth every 4 (four) hours as needed for moderate pain ((score 4 to 6)).   psyllium 58.6 % packet Commonly known as:  METAMUCIL Take 1 packet by mouth daily as needed (constipation).   tamsulosin 0.4 MG Caps capsule Commonly known as: Flomax Take 1 capsule (0.4 mg total) by mouth at bedtime.        Allergies:  Allergies  Allergen Reactions   Penicillins Swelling    Did it involve swelling of the face/tongue/throat, SOB, or low BP? No Did it involve sudden or severe rash/hives, skin peeling, or any reaction on the inside of your mouth or nose? No Did you need to seek medical attention at a hospital or doctor's office? Was already inpatient at the time When did it last happen?      2005 If all above answers are "NO", may proceed with cephalosporin use.     No family history on file.  Social History:  reports that he quit smoking about 14 years ago. His smoking use included cigarettes. He has never used smokeless tobacco. He reports current alcohol use of about 4.0 standard drinks per week. He reports that he does not use drugs.  ROS: A complete review of systems was performed.  All systems are negative except for pertinent findings as noted.  Physical Exam:  Vital signs in last 24 hours: BP (!) 149/70   Pulse 85  Constitutional:  Alert and oriented, No acute distress Cardiovascular: Regular rate  Respiratory: Normal respiratory effort GI: Abdomen is soft, nontender, nondistended, no abdominal masses. No CVAT.  Genitourinary: Normal male phallus, testes are descended bilaterally and non-tender and without masses, scrotum is normal in appearance without lesions or masses, perineum is normal on inspection.  Prostate 40 g, symmetrical, nonnodular, nontender. Lymphatic: No lymphadenopathy Neurologic: Grossly intact, no focal deficits Psychiatric: Normal mood and affect  I have reviewed prior pt notes  I have reviewed notes from referring/previous physicians--Dr Willey Blade  I have reviewed urinalysis results-urinalysis clear today, urine culture and urinalysis recently from Dr. Willey Blade normal  I  have independently reviewed bladder scan--108 mL residual    Impression/Assessment:  Dysuria.  Relatively lengthy in nature, sudden onset 2 months ago.  Abdominal exam, prostate exam benign.  Modest residual urine volume.  Urinalysis and urine cultures have been clear.  Question extra vesicle or extra prostatic process  Plan:  1.  He does have trace blood on dipstick.  I think it worthwhile to perform a CT to rule out stone versus diverticular disease  2.  We will call with results and follow-up

## 2020-12-02 NOTE — Progress Notes (Signed)
Urological Symptom Review  Patient is experiencing the following symptoms: Hard to postpone urination Burning/pain with urination Get up at night to urinate Stream starts and stops Urinary tract infection Weak stream   Review of Systems  Gastrointestinal (upper)  : Negative for upper GI symptoms  Gastrointestinal (lower) : Constipation  Constitutional : Weight loss  Skin: Negative for skin symptoms  Eyes: Negative for eye symptoms  Ear/Nose/Throat : Negative for Ear/Nose/Throat symptoms  Hematologic/Lymphatic: Easy bruising  Cardiovascular : Negative for cardiovascular symptoms  Respiratory : Negative for respiratory symptoms  Endocrine: Negative for endocrine symptoms  Musculoskeletal: Back pain  Neurological: Negative for neurological symptoms  Psychologic: Negative for psychiatric symptoms

## 2020-12-31 DIAGNOSIS — H401131 Primary open-angle glaucoma, bilateral, mild stage: Secondary | ICD-10-CM | POA: Diagnosis not present

## 2020-12-31 DIAGNOSIS — H01004 Unspecified blepharitis left upper eyelid: Secondary | ICD-10-CM | POA: Diagnosis not present

## 2020-12-31 DIAGNOSIS — H01001 Unspecified blepharitis right upper eyelid: Secondary | ICD-10-CM | POA: Diagnosis not present

## 2020-12-31 DIAGNOSIS — H01002 Unspecified blepharitis right lower eyelid: Secondary | ICD-10-CM | POA: Diagnosis not present

## 2021-01-06 ENCOUNTER — Other Ambulatory Visit: Payer: Self-pay

## 2021-01-06 ENCOUNTER — Ambulatory Visit (HOSPITAL_COMMUNITY)
Admission: RE | Admit: 2021-01-06 | Discharge: 2021-01-06 | Disposition: A | Discharge status: Home / Self Care | Payer: Medicare PPO | Source: Ambulatory Visit | Attending: Urology | Admitting: Urology

## 2021-01-06 DIAGNOSIS — N289 Disorder of kidney and ureter, unspecified: Secondary | ICD-10-CM | POA: Diagnosis not present

## 2021-01-06 DIAGNOSIS — N400 Enlarged prostate without lower urinary tract symptoms: Secondary | ICD-10-CM | POA: Diagnosis not present

## 2021-01-06 DIAGNOSIS — R319 Hematuria, unspecified: Secondary | ICD-10-CM | POA: Diagnosis not present

## 2021-01-06 DIAGNOSIS — R3129 Other microscopic hematuria: Secondary | ICD-10-CM | POA: Insufficient documentation

## 2021-01-06 DIAGNOSIS — N3289 Other specified disorders of bladder: Secondary | ICD-10-CM | POA: Diagnosis not present

## 2021-01-09 NOTE — Progress Notes (Signed)
Results sent via my chart 

## 2021-01-13 ENCOUNTER — Ambulatory Visit: Payer: Medicare PPO | Admitting: Urology

## 2021-01-19 DIAGNOSIS — Z79899 Other long term (current) drug therapy: Secondary | ICD-10-CM | POA: Diagnosis not present

## 2021-01-19 DIAGNOSIS — R7301 Impaired fasting glucose: Secondary | ICD-10-CM | POA: Diagnosis not present

## 2021-01-19 DIAGNOSIS — N400 Enlarged prostate without lower urinary tract symptoms: Secondary | ICD-10-CM | POA: Diagnosis not present

## 2021-01-19 DIAGNOSIS — C9141 Hairy cell leukemia, in remission: Secondary | ICD-10-CM | POA: Diagnosis not present

## 2021-01-19 DIAGNOSIS — H5203 Hypermetropia, bilateral: Secondary | ICD-10-CM | POA: Diagnosis not present

## 2021-01-26 DIAGNOSIS — N401 Enlarged prostate with lower urinary tract symptoms: Secondary | ICD-10-CM | POA: Diagnosis not present

## 2021-01-26 DIAGNOSIS — I700 Atherosclerosis of aorta: Secondary | ICD-10-CM | POA: Diagnosis not present

## 2021-01-26 DIAGNOSIS — R7301 Impaired fasting glucose: Secondary | ICD-10-CM | POA: Diagnosis not present

## 2021-01-26 DIAGNOSIS — Z6824 Body mass index (BMI) 24.0-24.9, adult: Secondary | ICD-10-CM | POA: Diagnosis not present

## 2021-01-26 DIAGNOSIS — C9142 Hairy cell leukemia, in relapse: Secondary | ICD-10-CM | POA: Diagnosis not present

## 2021-01-26 NOTE — Progress Notes (Signed)
History of Present Illness:    8.16.2022: 70-year-old male presents with a 2-month history of sudden onset dysuria, frequency and urgency.  He does have a history of BPH and is on finasteride and tamsulosin.  Currently, IPSS 25, quality-of-life score 6.  He also has significant bothersome nocturia x3.  He has had no fever or chills.  He has been treated with 3 courses of antibiotics, the last 1 for 28 days without improvement.  He has had back surgery 2 weeks ago.  These symptoms started well before that.  He has no pneumaturia.  He has no current constipation.  Sent for CT A/P.1. Slightly enlarged prostate. Associated bladder wall thickening is indicative of an element of outlet obstruction. 2.  Aortic atherosclerosis (ICD10-I70.0).  10.11.2022: He is still having terminal dysuria.  Denies gross hematuria.  I gave him phenazopyridine at his last visit.  He states that this does not help the dysuria.  However, he does say that he took AZO which he found over-the-counter at the pharmacy.  He states that that helped some (AZO has a lower dose of phenazopyridine than the prescribed brand)   Past Medical History:  Diagnosis Date   Abnormal liver function tests 05/03/2011   BPH (benign prostatic hypertrophy) 05/03/2011   Elevated transaminase level    GERD (gastroesophageal reflux disease)    Hairy cell leukemia    Hairy cell leukemia    1992   Hairy cell leukemia, in remission (HCC) 05/03/2011   IBS (irritable bowel syndrome) 05/03/2011   Irritable bowel syndrome    2 yrs ago, states he had inflammation around anus. Biopsy: inclusive    Past Surgical History:  Procedure Laterality Date   BONE MARROW BIOPSY     2007   CATARACT EXTRACTION W/PHACO Right 08/15/2020   Procedure: CATARACT EXTRACTION PHACO AND INTRAOCULAR LENS PLACEMENT RIGHT EYE;  Surgeon: Wrzosek, James, MD;  Location: [facility];  Service: Ophthalmology;  Laterality: Right;  right CDE=7.75   COLONOSCOPY  08   with polyps    COLONOSCOPY  09/30/2011   Procedure: COLONOSCOPY;  Surgeon: Najeeb U Rehman, MD;  Location: [facility];  Service: Endoscopy;  Laterality: N/A;  730   COLONOSCOPY N/A 01/11/2019   Procedure: COLONOSCOPY;  Surgeon: Rehman, Najeeb U, MD;  Location: [facility];  Service: Endoscopy;  Laterality: N/A;  100-office move to 9:30am   HX COLON POLYPS     TONSILLECTOMY     childhood   TRANSFORAMINAL LUMBAR INTERBODY FUSION W/ MIS 1 LEVEL Right 11/18/2020   Procedure: Right Lumbar Five-Sacral One Minimally invasive transforaminal lumbar interbody fusion;  Surgeon: Ostergard, Thomas A, MD;  Location: [facility];  Service: Neurosurgery;  Laterality: Right;    Home Medications:  Allergies as of 01/27/2021       Reactions   Penicillins Swelling   Did it involve swelling of the face/tongue/throat, SOB, or low BP? No Did it involve sudden or severe rash/hives, skin peeling, or any reaction on the inside of your mouth or nose? No Did you need to seek medical attention at a hospital or doctor's office? Was already inpatient at the time When did it last happen?      2005 If all above answers are "NO", may proceed with cephalosporin use.        Medication List        Accurate as of January 26, 2021  8:35 PM. If you have any questions, ask your nurse or doctor.            CHARCOAL ACTIVATED PO Take 520 mg by mouth daily.   Clear Eyes Seasonal Relief 0.012-0.2-0.25 % Soln Generic drug: Naphazoline-Glycerin-Zinc Sulf Place 1 drop into both eyes daily as needed (allergies).   cyclobenzaprine 10 MG tablet Commonly known as: FLEXERIL Take 1 tablet (10 mg total) by mouth 3 (three) times daily as needed for muscle spasms.   diphenhydrAMINE 25 mg capsule Commonly known as: BENADRYL Take 25-50 mg by mouth every 6 (six) hours as needed for allergies or sleep.   finasteride 5 MG tablet Commonly known as: PROSCAR Take 1 tablet (5 mg total) by mouth every morning.   FISH OIL OMEGA-3 PO Take 1,000 mg by  mouth daily. 300 mg omega 3   GINKOBA PO Take 120 mg by mouth daily.   ibuprofen 200 MG tablet Commonly known as: ADVIL Take 400 mg by mouth every 6 (six) hours as needed (Inflammation).   Magnesium 500 MG Caps Take 500 mg by mouth daily.   oxyCODONE 5 MG immediate release tablet Commonly known as: Oxy IR/ROXICODONE Take 1 tablet (5 mg total) by mouth every 4 (four) hours as needed for moderate pain ((score 4 to 6)).   phenazopyridine 200 MG tablet Commonly known as: Pyridium Take 1 tablet (200 mg total) by mouth 3 (three) times daily as needed (burning).   psyllium 58.6 % packet Commonly known as: METAMUCIL Take 1 packet by mouth daily as needed (constipation).   tamsulosin 0.4 MG Caps capsule Commonly known as: Flomax Take 1 capsule (0.4 mg total) by mouth at bedtime.        Allergies:  Allergies  Allergen Reactions   Penicillins Swelling    Did it involve swelling of the face/tongue/throat, SOB, or low BP? No Did it involve sudden or severe rash/hives, skin peeling, or any reaction on the inside of your mouth or nose? No Did you need to seek medical attention at a hospital or doctor's office? Was already inpatient at the time When did it last happen?      2005 If all above answers are "NO", may proceed with cephalosporin use.     No family history on file.  Social History:  reports that he quit smoking about 15 years ago. His smoking use included cigarettes. He has never used smokeless tobacco. He reports current alcohol use of about 4.0 standard drinks per week. He reports that he does not use drugs.  ROS: A complete review of systems was performed.  All systems are negative except for pertinent findings as noted.  Physical Exam:  Vital signs in last 24 hours: There were no vitals taken for this visit. Constitutional:  Alert and oriented, No acute distress Cardiovascular: Regular rate  Respiratory: Normal respiratory effort Neurologic: Grossly intact, no  focal deficits Psychiatric: Normal mood and affect  I have reviewed prior pt notes  I have reviewed urinalysis results  I have independently reviewed prior imaging-CT images reviewed with patient  I have reviewed prior PSA results  Cystoscopy Procedure Note:  Indication: Dysuria, microscopic hematuria  After informed consent and discussion of the procedure and its risks, Cameron Wu was positioned and prepped in the standard fashion.  Cystoscopy was performed with a flexible cystoscope.   Findings: Urethra: No stricture or lesion Prostate: Minimally obstructive, bilobar hypertrophy Bladder neck: No abnormalities Ureteral orifices: Normally located, configured Bladder: Papillary lesions in the left posterior/lateral trigonal region, as well as contralaterally on the right.  Low lying urothelial abnormalities around these consistent with TCCA.  The patient tolerated  the procedure well.        Impression/Assessment:  Probable bladder cancer  Plan:  I discussed cystoscopy, bilateral retrograde studies, TURBT and gemcitabine administration with the patient.  We will do that in the near future at the [facility] site.  Risks and complications were discussed as well as the probability that he will go home with a catheter at least overnight.  We will get that set up in the future.  I also prescribed him Uribel

## 2021-01-27 ENCOUNTER — Ambulatory Visit (INDEPENDENT_AMBULATORY_CARE_PROVIDER_SITE_OTHER): Payer: Medicare PPO | Admitting: Urology

## 2021-01-27 ENCOUNTER — Other Ambulatory Visit: Payer: Self-pay

## 2021-01-27 ENCOUNTER — Encounter: Payer: Self-pay | Admitting: Urology

## 2021-01-27 VITALS — BP 165/73 | HR 90 | Temp 98.3°F | Wt 177.0 lb

## 2021-01-27 DIAGNOSIS — R3129 Other microscopic hematuria: Secondary | ICD-10-CM | POA: Diagnosis not present

## 2021-01-27 DIAGNOSIS — N138 Other obstructive and reflux uropathy: Secondary | ICD-10-CM

## 2021-01-27 DIAGNOSIS — R972 Elevated prostate specific antigen [PSA]: Secondary | ICD-10-CM | POA: Diagnosis not present

## 2021-01-27 DIAGNOSIS — R300 Dysuria: Secondary | ICD-10-CM

## 2021-01-27 DIAGNOSIS — C678 Malignant neoplasm of overlapping sites of bladder: Secondary | ICD-10-CM | POA: Diagnosis not present

## 2021-01-27 DIAGNOSIS — N401 Enlarged prostate with lower urinary tract symptoms: Secondary | ICD-10-CM

## 2021-01-27 LAB — URINALYSIS, ROUTINE W REFLEX MICROSCOPIC
Bilirubin, UA: NEGATIVE
Glucose, UA: NEGATIVE
Ketones, UA: NEGATIVE
Nitrite, UA: NEGATIVE
Specific Gravity, UA: 1.025 (ref 1.005–1.030)
Urobilinogen, Ur: 0.2 mg/dL (ref 0.2–1.0)
pH, UA: 6 (ref 5.0–7.5)

## 2021-01-27 LAB — MICROSCOPIC EXAMINATION: Renal Epithel, UA: NONE SEEN /hpf

## 2021-01-27 MED ORDER — URIBEL 118 MG PO CAPS: 1.0000 | ORAL_CAPSULE | Freq: Three times a day (TID) | ORAL | 2 refills | Status: DC | PRN

## 2021-01-27 MED ORDER — CIPROFLOXACIN HCL 500 MG PO TABS
500.0000 mg | ORAL_TABLET | Freq: Once | ORAL | Status: AC
Start: 2021-01-27 — End: 2021-01-27
  Administered 2021-01-27: 500 mg via ORAL

## 2021-01-27 NOTE — Progress Notes (Signed)
Urological Symptom Review  Patient is experiencing the following symptoms: Frequent urination Burning/pain with urination Stream starts and stops Trouble starting stream Urinary tract infection   Review of Systems  Gastrointestinal (upper)  : Negative for upper GI symptoms  Gastrointestinal (lower) : Negative for lower GI symptoms  Constitutional : Negative for symptoms  Skin: Negative for skin symptoms  Eyes: Negative for eye symptoms  Ear/Nose/Throat : Negative for Ear/Nose/Throat symptoms  Hematologic/Lymphatic: Easy bruising  Cardiovascular : Negative for cardiovascular symptoms  Respiratory : Negative for respiratory symptoms  Endocrine: Negative for endocrine symptoms  Musculoskeletal: Back pain  Neurological: Negative for neurological symptoms  Psychologic: Negative for psychiatric symptoms

## 2021-01-27 NOTE — Addendum Note (Signed)
Addended by: Tyrone Apple on: 01/27/2021 04:48 PM   Modules accepted: Orders

## 2021-01-27 NOTE — Addendum Note (Signed)
Addended by: Dorisann Frames on: 01/27/2021 03:05 PM   Modules accepted: Orders

## 2021-02-02 ENCOUNTER — Other Ambulatory Visit: Payer: Self-pay | Admitting: Urology

## 2021-02-04 MED ORDER — GEMCITABINE CHEMO FOR BLADDER INSTILLATION 2000 MG: 2000.0000 mg | Freq: Once | INTRAVENOUS | Status: AC

## 2021-02-17 DIAGNOSIS — C679 Malignant neoplasm of bladder, unspecified: Secondary | ICD-10-CM

## 2021-02-17 HISTORY — DX: Malignant neoplasm of bladder, unspecified: C67.9

## 2021-02-18 ENCOUNTER — Other Ambulatory Visit: Payer: Self-pay

## 2021-02-18 ENCOUNTER — Encounter (HOSPITAL_BASED_OUTPATIENT_CLINIC_OR_DEPARTMENT_OTHER): Payer: Self-pay | Admitting: Urology

## 2021-02-18 NOTE — Progress Notes (Signed)
Spoke w/ via phone for pre-op interview---pt  Lab needs dos---- none              Lab results------n/a COVID test -----patient states asymptomatic no test needed Arrive at -------0730 02/23/2021 NPO after MN NO Solid Food.  Clear liquids from MN until---0630 02/23/2021 Med rec completed Medications to take morning of surgery -----proscar, flomax Diabetic medication -----n/a Patient instructed no nail polish to be worn day of surgery Patient instructed to bring photo id and insurance card day of surgery Patient aware to have Driver (ride ) / caregiver wife Quita Skye   for 24 hours after surgery  Patient Special Instructions -----none Pre-Op special Istructions -----none Patient verbalized understanding of instructions that were given at this phone interview. Patient denies shortness of breath, chest pain, fever, cough at this phone interview.

## 2021-02-23 ENCOUNTER — Other Ambulatory Visit: Payer: Self-pay

## 2021-02-23 ENCOUNTER — Ambulatory Visit (HOSPITAL_BASED_OUTPATIENT_CLINIC_OR_DEPARTMENT_OTHER): Payer: Medicare PPO | Admitting: Anesthesiology

## 2021-02-23 ENCOUNTER — Ambulatory Visit (HOSPITAL_BASED_OUTPATIENT_CLINIC_OR_DEPARTMENT_OTHER)
Admission: RE | Admit: 2021-02-23 | Discharge: 2021-02-23 | Disposition: A | Discharge status: Home / Self Care | Payer: Medicare PPO | Attending: Urology | Admitting: Urology

## 2021-02-23 ENCOUNTER — Encounter (HOSPITAL_BASED_OUTPATIENT_CLINIC_OR_DEPARTMENT_OTHER): Payer: Self-pay | Admitting: Urology

## 2021-02-23 ENCOUNTER — Encounter (HOSPITAL_BASED_OUTPATIENT_CLINIC_OR_DEPARTMENT_OTHER)
Admission: RE | Disposition: A | Discharge status: Home / Self Care | Payer: Self-pay | Source: Home / Self Care | Attending: Urology

## 2021-02-23 DIAGNOSIS — D494 Neoplasm of unspecified behavior of bladder: Secondary | ICD-10-CM | POA: Diagnosis not present

## 2021-02-23 DIAGNOSIS — C9591 Leukemia, unspecified, in remission: Secondary | ICD-10-CM | POA: Diagnosis not present

## 2021-02-23 DIAGNOSIS — K219 Gastro-esophageal reflux disease without esophagitis: Secondary | ICD-10-CM | POA: Diagnosis not present

## 2021-02-23 DIAGNOSIS — C679 Malignant neoplasm of bladder, unspecified: Secondary | ICD-10-CM | POA: Diagnosis not present

## 2021-02-23 DIAGNOSIS — M5416 Radiculopathy, lumbar region: Secondary | ICD-10-CM | POA: Diagnosis not present

## 2021-02-23 DIAGNOSIS — N400 Enlarged prostate without lower urinary tract symptoms: Secondary | ICD-10-CM | POA: Diagnosis not present

## 2021-02-23 DIAGNOSIS — C678 Malignant neoplasm of overlapping sites of bladder: Secondary | ICD-10-CM

## 2021-02-23 DIAGNOSIS — C674 Malignant neoplasm of posterior wall of bladder: Secondary | ICD-10-CM | POA: Diagnosis not present

## 2021-02-23 HISTORY — PX: CYSTOSCOPY W/ RETROGRADES: SHX1426

## 2021-02-23 HISTORY — PX: TRANSURETHRAL RESECTION OF BLADDER TUMOR WITH MITOMYCIN-C: SHX6459

## 2021-02-23 SURGERY — TRANSURETHRAL RESECTION OF BLADDER TUMOR WITH MITOMYCIN-C
Anesthesia: General

## 2021-02-23 MED ORDER — DEXAMETHASONE SODIUM PHOSPHATE 10 MG/ML IJ SOLN
INTRAMUSCULAR | Status: AC
  Filled 2021-02-23: qty 1

## 2021-02-23 MED ORDER — DEXAMETHASONE SODIUM PHOSPHATE 10 MG/ML IJ SOLN
INTRAMUSCULAR | Status: DC | PRN
  Administered 2021-02-23: 4 mg via INTRAVENOUS

## 2021-02-23 MED ORDER — ONDANSETRON HCL 4 MG/2ML IJ SOLN
INTRAMUSCULAR | Status: DC | PRN
  Administered 2021-02-23: 4 mg via INTRAVENOUS

## 2021-02-23 MED ORDER — FENTANYL CITRATE (PF) 100 MCG/2ML IJ SOLN
INTRAMUSCULAR | Status: AC
  Filled 2021-02-23: qty 2

## 2021-02-23 MED ORDER — LIDOCAINE 2% (20 MG/ML) 5 ML SYRINGE
INTRAMUSCULAR | Status: AC
  Filled 2021-02-23: qty 5

## 2021-02-23 MED ORDER — PROPOFOL 10 MG/ML IV BOLUS
INTRAVENOUS | Status: DC | PRN
  Administered 2021-02-23: 150 mg via INTRAVENOUS
  Administered 2021-02-23: 50 mg via INTRAVENOUS
  Administered 2021-02-23: 100 mg via INTRAVENOUS

## 2021-02-23 MED ORDER — FENTANYL CITRATE (PF) 100 MCG/2ML IJ SOLN
INTRAMUSCULAR | Status: DC | PRN
  Administered 2021-02-23 (×2): 50 ug via INTRAVENOUS

## 2021-02-23 MED ORDER — ACETAMINOPHEN 500 MG PO TABS
1000.0000 mg | ORAL_TABLET | Freq: Once | ORAL | Status: AC
  Administered 2021-02-23: 1000 mg via ORAL

## 2021-02-23 MED ORDER — CIPROFLOXACIN IN D5W 400 MG/200ML IV SOLN
400.0000 mg | INTRAVENOUS | Status: AC
  Administered 2021-02-23: 400 mg via INTRAVENOUS

## 2021-02-23 MED ORDER — TRAMADOL HCL 50 MG PO TABS: 50.0000 mg | ORAL_TABLET | Freq: Four times a day (QID) | ORAL | 0 refills | Status: DC | PRN

## 2021-02-23 MED ORDER — FENTANYL CITRATE (PF) 100 MCG/2ML IJ SOLN
25.0000 ug | INTRAMUSCULAR | Status: DC | PRN
  Administered 2021-02-23: 25 ug via INTRAVENOUS

## 2021-02-23 MED ORDER — PROPOFOL 10 MG/ML IV BOLUS
INTRAVENOUS | Status: AC
  Filled 2021-02-23: qty 40

## 2021-02-23 MED ORDER — EPHEDRINE 5 MG/ML INJ
INTRAVENOUS | Status: AC
  Filled 2021-02-23: qty 5

## 2021-02-23 MED ORDER — LACTATED RINGERS IV SOLN: INTRAVENOUS | Status: DC

## 2021-02-23 MED ORDER — IOHEXOL 300 MG/ML  SOLN
INTRAMUSCULAR | Status: DC | PRN
  Administered 2021-02-23: 20 mL via URETHRAL

## 2021-02-23 MED ORDER — AMISULPRIDE (ANTIEMETIC) 5 MG/2ML IV SOLN: 10.0000 mg | Freq: Once | INTRAVENOUS | Status: DC | PRN

## 2021-02-23 MED ORDER — SODIUM CHLORIDE 0.9 % IR SOLN
Status: DC | PRN
  Administered 2021-02-23: 9000 mL via INTRAVESICAL

## 2021-02-23 MED ORDER — CEPHALEXIN 500 MG PO CAPS: 500.0000 mg | ORAL_CAPSULE | Freq: Two times a day (BID) | ORAL | 0 refills | Status: AC

## 2021-02-23 MED ORDER — OXYCODONE HCL 5 MG PO TABS
5.0000 mg | ORAL_TABLET | Freq: Four times a day (QID) | ORAL | Status: AC | PRN
  Administered 2021-02-23: 5 mg via ORAL

## 2021-02-23 MED ORDER — LIDOCAINE 2% (20 MG/ML) 5 ML SYRINGE
INTRAMUSCULAR | Status: DC | PRN
  Administered 2021-02-23: 75 mg via INTRAVENOUS

## 2021-02-23 MED ORDER — OXYCODONE HCL 5 MG PO TABS
ORAL_TABLET | ORAL | Status: AC
  Filled 2021-02-23: qty 1

## 2021-02-23 MED ORDER — ONDANSETRON HCL 4 MG/2ML IJ SOLN
INTRAMUSCULAR | Status: AC
  Filled 2021-02-23: qty 2

## 2021-02-23 MED ORDER — ACETAMINOPHEN 500 MG PO TABS
ORAL_TABLET | ORAL | Status: AC
  Filled 2021-02-23: qty 2

## 2021-02-23 MED ORDER — EPHEDRINE SULFATE-NACL 50-0.9 MG/10ML-% IV SOSY
PREFILLED_SYRINGE | INTRAVENOUS | Status: DC | PRN
  Administered 2021-02-23: 10 mg via INTRAVENOUS

## 2021-02-23 MED ORDER — CIPROFLOXACIN IN D5W 400 MG/200ML IV SOLN
INTRAVENOUS | Status: AC
  Filled 2021-02-23: qty 200

## 2021-02-23 SURGICAL SUPPLY — 27 items
BAG DRAIN URO-CYSTO SKYTR STRL (DRAIN) ×3 IMPLANT
BAG DRN RND TRDRP ANRFLXCHMBR (UROLOGICAL SUPPLIES) ×2
BAG DRN UROCATH (DRAIN) ×2
BAG URINE DRAIN 2000ML AR STRL (UROLOGICAL SUPPLIES) ×3 IMPLANT
BAG URINE LEG 500ML (DRAIN) IMPLANT
CATH FOLEY 2WAY SLVR  5CC 18FR (CATHETERS) ×3
CATH FOLEY 2WAY SLVR 5CC 18FR (CATHETERS) ×2 IMPLANT
CLOTH BEACON ORANGE TIMEOUT ST (SAFETY) ×3 IMPLANT
GLOVE SURG ENC MOIS LTX SZ7 (GLOVE) ×6 IMPLANT
GLOVE SURG ENC MOIS LTX SZ8 (GLOVE) ×3 IMPLANT
GOWN STRL REUS W/TWL LRG LVL3 (GOWN DISPOSABLE) ×3 IMPLANT
GOWN STRL REUS W/TWL XL LVL3 (GOWN DISPOSABLE) ×3 IMPLANT
GUIDEWIRE ANG ZIPWIRE 038X150 (WIRE) IMPLANT
GUIDEWIRE STR DUAL SENSOR (WIRE) IMPLANT
HOLDER FOLEY CATH W/STRAP (MISCELLANEOUS) IMPLANT
IV NS IRRIG 3000ML ARTHROMATIC (IV SOLUTION) ×12 IMPLANT
KIT TURNOVER CYSTO (KITS) ×3 IMPLANT
LOOP CUT BIPOLAR 24F LRG (ELECTROSURGICAL) ×3 IMPLANT
MANIFOLD NEPTUNE II (INSTRUMENTS) ×3 IMPLANT
NS IRRIG 500ML POUR BTL (IV SOLUTION) ×3 IMPLANT
PACK CYSTO (CUSTOM PROCEDURE TRAY) ×3 IMPLANT
SYR CONTROL 10ML LL (SYRINGE) ×3 IMPLANT
SYR TOOMEY IRRIG 70ML (MISCELLANEOUS) ×3
SYRINGE TOOMEY IRRIG 70ML (MISCELLANEOUS) ×2 IMPLANT
TUBE CONNECTING 12X1/4 (SUCTIONS) ×3 IMPLANT
TUBING UROLOGY SET (TUBING) ×3 IMPLANT
WATER STERILE IRR 500ML POUR (IV SOLUTION) ×3 IMPLANT

## 2021-02-23 NOTE — Interval H&P Note (Signed)
History and Physical Interval Note:  02/23/2021 9:22 AM  Cameron Wu  has presented today for surgery, with the diagnosis of BLADDER LESIONS.  The various methods of treatment have been discussed with the patient and family. After consideration of risks, benefits and other options for treatment, the patient has consented to  Procedure(s): TRANSURETHRAL RESECTION OF BLADDER TUMOR WITH GEMCITABINE (N/A) CYSTOSCOPY WITH RETROGRADE PYELOGRAM (Bilateral) as a surgical intervention.  The patient's history has been reviewed, patient examined, no change in status, stable for surgery.  I have reviewed the patient's chart and labs.  Questions were answered to the patient's satisfaction.     Lillette Boxer [place]les

## 2021-02-23 NOTE — Progress Notes (Signed)
Patient entered PACU with foley. Foley was not draining any urine. Dr. Diona Fanti notified and gave verbal order to irrigate patients bladder. 12mL of saline used to irrigate bladder. Patients foley now draining cherry red urine.

## 2021-02-23 NOTE — Anesthesia Preprocedure Evaluation (Signed)
Anesthesia Evaluation  Patient identified by MRN, date of birth, ID band Patient awake    Reviewed: Allergy & Precautions, NPO status , Patient's Chart, lab work & pertinent test results  Airway Mallampati: II   Neck ROM: Full    Dental  (+) Dental Advisory Given   Pulmonary former smoker,    breath sounds clear to auscultation       Cardiovascular negative cardio ROS   Rhythm:Regular Rate:Normal     Neuro/Psych  Neuromuscular disease    GI/Hepatic Neg liver ROS, GERD  ,  Endo/Other  negative endocrine ROS  Renal/GU negative Renal ROS     Musculoskeletal   Abdominal   Peds  Hematology Leukemia- in remission 2008    Anesthesia Other Findings   Reproductive/Obstetrics                             Anesthesia Physical Anesthesia Plan  ASA: 3  Anesthesia Plan: General   Post-op Pain Management:    Induction: Intravenous  PONV Risk Score and Plan: 2 and Dexamethasone, Ondansetron and Treatment may vary due to age or medical condition  Airway Management Planned: LMA  Additional Equipment: None  Intra-op Plan:   Post-operative Plan: Extubation in OR  Informed Consent: I have reviewed the patients History and Physical, chart, labs and discussed the procedure including the risks, benefits and alternatives for the proposed anesthesia with the patient or authorized representative who has indicated his/her understanding and acceptance.     Dental advisory given  Plan Discussed with: CRNA  Anesthesia Plan Comments:         Anesthesia Quick Evaluation

## 2021-02-23 NOTE — Discharge Instructions (Addendum)
You may see some blood in the urine and may have some burning with urination for 48-72 hours. You also may notice that you have to urinate more frequently or urgently after your procedure which is normal.  You should call should you develop an inability urinate, fever > 101, persistent nausea and vomiting that prevents you from eating or drinking to stay hydrated.  If you have a catheter, you will be taught how to take care of the catheter by the nursing staff prior to discharge from the hospital.  You may periodically feel a strong urge to void with the catheter in place.  This is a bladder spasm and most often can occur when having a bowel movement or moving around. It is typically self-limited and usually will stop after a few minutes.  You may use some Vaseline or Neosporin around the tip of the catheter to reduce friction at the tip of the penis. You may also see some blood in the urine.  A very small amount of blood can make the urine look quite red.  As long as the catheter is draining well, there usually is not a problem.  However, if the catheter is not draining well and is bloody, you should call the office [phone]) to notify us.  Wait until Thursday morning to take the catheter out.     No acetaminophen/Tylenol until after 2:00pm today if needed for pain.      Post Anesthesia Home Care Instructions  Activity: Get plenty of rest for the remainder of the day. A responsible individual must stay with you for 24 hours following the procedure.  For the next 24 hours, DO NOT: -Drive a car -Paediatric nurse -Drink alcoholic beverages -Take any medication unless instructed by your physician -Make any legal decisions or sign important papers.  Meals: Start with liquid foods such as gelatin or soup. Progress to regular foods as tolerated. Avoid greasy, spicy, heavy foods. If nausea and/or vomiting occur, drink only clear liquids until the nausea and/or vomiting subsides. Call your  physician if vomiting continues.  Special Instructions/Symptoms: Your throat may feel dry or sore from the anesthesia or the breathing tube placed in your throat during surgery. If this causes discomfort, gargle with warm salt water. The discomfort should disappear within 24 hours.

## 2021-02-23 NOTE — Anesthesia Procedure Notes (Signed)
Procedure Name: LMA Insertion Date/Time: 02/23/2021 9:37 AM Performed by: Bonney Aid, CRNA Pre-anesthesia Checklist: Patient identified, Emergency Drugs available, Suction available and Patient being monitored Patient Re-evaluated:Patient Re-evaluated prior to induction Oxygen Delivery Method: Circle system utilized Preoxygenation: Pre-oxygenation with 100% oxygen Induction Type: IV induction Ventilation: Mask ventilation without difficulty LMA: LMA inserted LMA Size: 5.0 Number of attempts: 1 Airway Equipment and Method: Bite block Placement Confirmation: positive ETCO2 Tube secured with: Tape Dental Injury: Teeth and Oropharynx as per pre-operative assessment

## 2021-02-23 NOTE — Op Note (Signed)
Preoperative diagnosis: Probable bladder cancer, large area on posterior/left bladder wall  Postoperative diagnosis: Same  Principal procedure: Cystoscopy, bilateral retrograde ureteropyelogram's, fluoroscopic interpretation, transurethral resection of bladder tumor, approximately 6.5 cm lesion  Surgeon: Natia Fahmy  Anesthesia: General with LMA  Complications: None  Estimated blood loss: Less than 30 mL  Specimen: Bladder tumor, to pathology  Indications: 70 year old male with irritative voiding symptoms, no gross hematuria.  He was first treated with antibiotics, antispasmodics and recent cystoscopy revealed the patient to have probable bladder cancer in the posterior/left bladder wall.  He presents at this time for management.  I discussed the procedure, risks, complications and expected outcomes.  I also discussed possible gemcitabine administration.  He understands the procedures, and desires to proceed.  Findings: Prostate was minimally obstructive, urethra without any lesions.  Ureteral orifices were normal in their location and configuration.  Urothelium of the bladder was thoroughly examined.  There was an area perhaps 6.5 cm in diameter on the left posterior and lateral walls.  This was mixed papillary and erythematous in nature.  Papillary lesions were perhaps 1 cm high  Retrograde study of both ureters and pyelocalyceal systems revealed normal ureters, normal pyelocalyceal system without any evidence of filling defects.  Description of procedure: Patient was properly identified in the holding area pedis taken to the operating room where general anesthetic was administered with the LMA.  He was placed in the dorsolithotomy position.  Genitalia and perineum were prepped, draped, proper timeout performed.  21 French panendoscope advanced into the bladder.  Circumferential inspection was performed with the above-mentioned findings.  Both ureteral orifices were cannulated with a 6 [place]  open-ended catheter and utilizing Omnipaque retrograde ureteropyelogram's were performed.  Findings were totally normal bilaterally.  Following this, the cystoscope was removed and the 26 French resectoscope sheath was passed using the visual obturator.  Cutting loop was performed.  Resection of the abnormal area on the posterior/left bladder wall was resected down well into the muscular layer.  All abnormal area was removed.  Resection was quite deep and because of the large area of resection, I felt that we should not place gemcitabine.  I carefully coagulated all resected area.  The tumor fragments were then removed with the [facility] syringe and sent for pathology.  Repeat inspection on multiple occasions revealed small amount of bleeding which was carefully coagulated.  Once completed, with the irrigation turned off, there was excellent hemostasis.  At this point, a small amount of fragment was again removed with a [place] syringe and sent with the prior specimen.  The scope was removed.  An 1 French Foley catheter was placed, balloon filled with 10 cc of water and hooked to dependent drainage.  At this point the procedure was terminated.  The patient was awakened, taken to the PACU in stable condition having tolerated procedure well.  He will follow-up later this month in the office follow-up.  I will call him with his pathology result.  Catheter will be left in 3 days, he will remove this at home.

## 2021-02-23 NOTE — Anesthesia Postprocedure Evaluation (Signed)
Anesthesia Post Note  Patient: Cameron Wu  Procedure(s) Performed: TRANSURETHRAL RESECTION OF BLADDER TUMOR CYSTOSCOPY WITH RETROGRADE PYELOGRAM (Bilateral)     Patient location during evaluation: PACU Anesthesia Type: General Level of consciousness: awake and alert Pain management: pain level controlled Vital Signs Assessment: post-procedure vital signs reviewed and stable Respiratory status: spontaneous breathing, nonlabored ventilation, respiratory function stable and patient connected to nasal cannula oxygen Cardiovascular status: blood pressure returned to baseline and stable Postop Assessment: no apparent nausea or vomiting Anesthetic complications: no   No notable events documented.  Last Vitals:  Vitals:   02/23/21 1100 02/23/21 1115  BP: 139/83 (!) 141/87  Pulse: (!) 59 (!) 56  Resp: 13 10  Temp: (!) 36.3 C   SpO2: 100% 98%    Last Pain:  Vitals:   02/23/21 1120  TempSrc:   PainSc: 6                  Tiajuana Amass

## 2021-02-23 NOTE — H&P (Signed)
H&P  Chief Complaint: Bladder tumors  History of Present Illness: 70 yo male presents for TURBT/gemcitabine for mgmt of newly-diagnosed bladder tumors.  Past Medical History:  Diagnosis Date   Abnormal liver function tests 05/03/2011   BPH (benign prostatic hypertrophy) 05/03/2011   Elevated transaminase level    GERD (gastroesophageal reflux disease)    Hairy cell leukemia    In remission since 2008   Hairy cell leukemia    1992   Hairy cell leukemia, in remission ([place]) 05/03/2011   IBS (irritable bowel syndrome) 05/03/2011   Irritable bowel syndrome    2 yrs ago, states he had inflammation around anus. Biopsy: inclusive    Past Surgical History:  Procedure Laterality Date   BONE MARROW BIOPSY     2007 and 1992   CATARACT EXTRACTION W/PHACO Right 08/15/2020   Procedure: CATARACT EXTRACTION PHACO AND INTRAOCULAR LENS PLACEMENT RIGHT EYE;  Surgeon: Baruch Goldmann, MD;  Location: [facility];  Service: Ophthalmology;  Laterality: Right;  right CDE=7.75   COLONOSCOPY  04/19/2006   with polyps   COLONOSCOPY  09/30/2011   Procedure: COLONOSCOPY;  Surgeon: Rogene Houston, MD;  [facility];  Service: Endoscopy;  Laterality: N/A;  730   COLONOSCOPY N/A 01/11/2019   Procedure: COLONOSCOPY;  Surgeon: Rogene Houston, MD;  Location: [facility];  Service: Endoscopy;  Laterality: N/A;  100-office move to 9:30am   HX COLON POLYPS     TONSILLECTOMY     childhood   TRANSFORAMINAL LUMBAR INTERBODY FUSION W/ MIS 1 LEVEL Right 11/18/2020   Procedure: Right Lumbar Five-Sacral One Minimally invasive transforaminal lumbar interbody fusion;  Surgeon: Judith Part, MD;  Location: [facility];  Service: Neurosurgery;  Laterality: Right;    Home Medications:  Allergies as of 02/23/2021       Reactions   Penicillins Swelling   Did it involve swelling of the face/tongue/throat, SOB, or low BP? No Did it involve sudden or severe rash/hives, skin peeling, or any reaction on the inside  of your mouth or nose? No Did you need to seek medical attention at a hospital or doctor's office? Was already inpatient at the time When did it last happen?      2005 If all above answers are "NO", may proceed with cephalosporin use.        Medication List      Notice   Cannot display discharge medications because the patient has not yet been admitted.     Allergies:  Allergies  Allergen Reactions   Penicillins Swelling    Did it involve swelling of the face/tongue/throat, SOB, or low BP? No Did it involve sudden or severe rash/hives, skin peeling, or any reaction on the inside of your mouth or nose? No Did you need to seek medical attention at a hospital or doctor's office? Was already inpatient at the time When did it last happen?      2005 If all above answers are "NO", may proceed with cephalosporin use.     History reviewed. No pertinent family history.  Social History:  reports that he quit smoking about 15 years ago. His smoking use included cigarettes. He has never used smokeless tobacco. He reports current alcohol use of about 4.0 standard drinks per week. He reports that he does not use drugs.  ROS: A complete review of systems was performed.  All systems are negative except for pertinent findings as noted.  Physical Exam:  Vital signs in last 24 hours: Ht 6' (  1.829 m)   Wt 78.9 kg   BMI 23.60 kg/m  Constitutional:  Alert and oriented, No acute distress Cardiovascular: Regular rate  Respiratory: Normal respiratory effort GI: Abdomen is soft, nontender, nondistended, no abdominal masses. No CVAT.  Genitourinary: Normal male phallus, testes are descended bilaterally and non-tender and without masses, scrotum is normal in appearance without lesions or masses, perineum is normal on inspection. Lymphatic: No lymphadenopathy Neurologic: Grossly intact, no focal deficits Psychiatric: Normal mood and affect  Laboratory Data:  No results for input(s): WBC, HGB,  HCT, PLT in the last 72 hours.  No results for input(s): NA, K, CL, GLUCOSE, BUN, CALCIUM, CREATININE in the last 72 hours.  Invalid input(s): CO3   No results found for this or any previous visit (from the past 24 hour(s)). No results found for this or any previous visit (from the past 240 hour(s)).  Renal Function: No results for input(s): CREATININE in the last 168 hours. CrCl cannot be calculated (Patient's most recent lab result is older than the maximum 21 days allowed.).  Radiologic Imaging: No results found.  Impression/Assessment:  Bladder tumors  Plan:  TURBT, gemcitabine

## 2021-02-23 NOTE — Transfer of Care (Signed)
Immediate Anesthesia Transfer of Care Note  Patient: Cameron Wu  Procedure(s) Performed: TRANSURETHRAL RESECTION OF BLADDER TUMOR CYSTOSCOPY WITH RETROGRADE PYELOGRAM (Bilateral)  Patient Location: PACU  Anesthesia Type:General  Level of Consciousness: drowsy  Airway & Oxygen Therapy: Patient Spontanous Breathing and Patient connected to nasal cannula oxygen  Post-op Assessment: Report given to RN  Post vital signs: Reviewed and stable  Last Vitals:  Vitals Value Taken Time  BP 136/71 02/23/21 1033  Temp    Pulse 65 02/23/21 1035  Resp 11 02/23/21 1035  SpO2 96 % 02/23/21 1035  Vitals shown include unvalidated device data.  Last Pain:  Vitals:   02/23/21 0747  TempSrc: Oral  PainSc: 10-Worst pain ever         Complications: No notable events documented.

## 2021-02-24 ENCOUNTER — Encounter (HOSPITAL_BASED_OUTPATIENT_CLINIC_OR_DEPARTMENT_OTHER): Payer: Self-pay | Admitting: Urology

## 2021-02-25 LAB — SURGICAL PATHOLOGY

## 2021-03-03 ENCOUNTER — Telehealth: Payer: Self-pay

## 2021-03-03 NOTE — Telephone Encounter (Signed)
Pt called and wanted to know if he he need to  keep taking the the uribel , pt was advised that this as needed medication , if he having any urination issues.

## 2021-03-16 NOTE — Progress Notes (Incomplete)
History of Present Illness: *** ° °Past Medical History:  °Diagnosis Date  ° Abnormal liver function tests 05/03/2011  ° BPH (benign prostatic hypertrophy) 05/03/2011  ° Elevated transaminase level   ° GERD (gastroesophageal reflux disease)   ° Hairy cell leukemia   ° In remission since 2008  ° Hairy cell leukemia   ° 1992  ° Hairy cell leukemia, in remission (HCC) 05/03/2011  ° IBS (irritable bowel syndrome) 05/03/2011  ° Irritable bowel syndrome   ° 2 yrs ago, states he had inflammation around anus. Biopsy: inclusive  ° ° °Past Surgical History:  °Procedure Laterality Date  ° BONE MARROW BIOPSY    ° 2007 and 1992  ° CATARACT EXTRACTION W/PHACO Right 08/15/2020  ° Procedure: CATARACT EXTRACTION PHACO AND INTRAOCULAR LENS PLACEMENT RIGHT EYE;  Surgeon: Wrzosek, James, MD;  Location: [facility];  Service: Ophthalmology;  Laterality: Right;  right °CDE=7.75  ° COLONOSCOPY  04/19/2006  ° with polyps  ° COLONOSCOPY  09/30/2011  ° Procedure: COLONOSCOPY;  Surgeon: Najeeb U Rehman, MD;  [facility];  Service: Endoscopy;  Laterality: N/A;  730  ° COLONOSCOPY N/A 01/11/2019  ° Procedure: COLONOSCOPY;  Surgeon: Rehman, Najeeb U, MD;  [facility]: [facility];  Service: Endoscopy;  Laterality: N/A;  100-office move to 9:30am  ° CYSTOSCOPY W/ RETROGRADES Bilateral 02/23/2021  ° Procedure: CYSTOSCOPY WITH RETROGRADE PYELOGRAM;  Surgeon: , , MD;  Location: [facility];  Service: Urology;  Laterality: Bilateral;  ° HX COLON POLYPS    ° TONSILLECTOMY    ° childhood  ° TRANSFORAMINAL LUMBAR INTERBODY FUSION W/ MIS 1 LEVEL Right 11/18/2020  ° Procedure: Right Lumbar Five-Sacral One Minimally invasive transforaminal lumbar interbody fusion;  Surgeon: Ostergard, Thomas A, MD;  Location: [facility];  Service: Neurosurgery;  Laterality: Right;  ° TRANSURETHRAL RESECTION OF BLADDER TUMOR WITH MITOMYCIN-C N/A 02/23/2021  ° Procedure: TRANSURETHRAL RESECTION OF BLADDER TUMOR;  Surgeon: , , MD;   Location: [facility];  Service: Urology;  Laterality: N/A;  ° ° °Home Medications:  °Allergies as of 03/17/2021   ° °   Reactions  ° Penicillins Swelling  ° Did it involve swelling of the face/tongue/throat, SOB, or low BP? No °Did it involve sudden or severe rash/hives, skin peeling, or any reaction on the inside of your mouth or nose? No °Did you need to seek medical attention at a hospital or doctor's office? Was already inpatient at the time °When did it last happen?      2005 °If all above answers are “NO”, may proceed with cephalosporin use.  ° °  ° °  °Medication List  °  ° °  ° Accurate as of March 16, 2021  8:11 PM. If you have any questions, ask your nurse or doctor.  °  °  ° °  ° °CHARCOAL ACTIVATED PO °Take 520 mg by mouth daily. Over week °  °Clear Eyes Seasonal Relief 0.012-0.2-0.25 % Soln °Generic drug: Naphazoline-Glycerin-Zinc Sulf °Place 1 drop into both eyes daily as needed (allergies). °  °diphenhydrAMINE 25 mg capsule °Commonly known as: BENADRYL °Take 25-50 mg by mouth every 6 (six) hours as needed for allergies or sleep. °  °finasteride 5 MG tablet °Commonly known as: PROSCAR °Take 1 tablet (5 mg total) by mouth every morning. °  °FISH OIL OMEGA-3 PO °Take 1,000 mg by mouth daily. 300 mg omega 3. Stopped 10 days from. °  °GINKOBA PO °Take 120 mg by mouth daily. Stopped will restart after   surgery °  °ibuprofen 200 MG tablet °Commonly known as: ADVIL °Take 400 mg by mouth every 6 (six) hours as needed (Inflammation). °  °phenazopyridine 200 MG tablet °Commonly known as: Pyridium °Take 1 tablet (200 mg total) by mouth 3 (three) times daily as needed (burning). °  °psyllium 58.6 % packet °Commonly known as: METAMUCIL °Take 1 packet by mouth as needed (constipation). °  °rosuvastatin 10 MG tablet °Commonly known as: CRESTOR °Take 10 mg by mouth at bedtime. °  °tamsulosin 0.4 MG Caps capsule °Commonly known as: Flomax °Take 1 capsule (0.4 mg total) by mouth at bedtime. °   °traMADol 50 MG tablet °Commonly known as: Ultram °Take 1 tablet (50 mg total) by mouth every 6 (six) hours as needed. °  °Uribel 118 MG Caps °Take 1 capsule (118 mg total) by mouth every 8 (eight) hours as needed (Urinary discomfort). °  ° °  ° ° °Allergies:  °Allergies  °Allergen Reactions  ° Penicillins Swelling  °  Did it involve swelling of the face/tongue/throat, SOB, or low BP? No °Did it involve sudden or severe rash/hives, skin peeling, or any reaction on the inside of your mouth or nose? No °Did you need to seek medical attention at a hospital or doctor's office? Was already inpatient at the time °When did it last happen?      2005 °If all above answers are “NO”, may proceed with cephalosporin use. °  ° ° °No family history on file. ° °Social History:  reports that he quit smoking about 15 years ago. His smoking use included cigarettes. He has never used smokeless tobacco. He reports current alcohol use of about 4.0 standard drinks per week. He reports that he does not use drugs. ° °ROS: °A complete review of systems was performed.  All systems are negative except for pertinent findings as noted. ° °Physical Exam:  °Vital signs in last 24 hours: °There were no vitals taken for this visit. °Constitutional:  Alert and oriented, No acute distress °Cardiovascular: Regular rate  °Respiratory: Normal respiratory effort °GI: Abdomen is soft, nontender, nondistended, no abdominal masses. No CVAT.  °Genitourinary: Normal male phallus, testes are descended bilaterally and non-tender and without masses, scrotum is normal in appearance without lesions or masses, perineum is normal on inspection. °Lymphatic: No lymphadenopathy °Neurologic: Grossly intact, no focal deficits °Psychiatric: Normal mood and affect ° °I have reviewed prior pt notes ° °I have reviewed urinalysis results ° °I have independently reviewed prior imaging ° °I have reviewed path results ° ° ° °Impression/Assessment:  °*** ° °Plan:  °*** ° °

## 2021-03-17 ENCOUNTER — Other Ambulatory Visit: Payer: Self-pay

## 2021-03-17 ENCOUNTER — Ambulatory Visit (INDEPENDENT_AMBULATORY_CARE_PROVIDER_SITE_OTHER): Payer: Medicare PPO | Admitting: Urology

## 2021-03-17 ENCOUNTER — Ambulatory Visit: Payer: Medicare PPO | Admitting: Urology

## 2021-03-17 VITALS — BP 147/72 | HR 80

## 2021-03-17 DIAGNOSIS — C678 Malignant neoplasm of overlapping sites of bladder: Secondary | ICD-10-CM | POA: Diagnosis not present

## 2021-03-17 LAB — URINALYSIS, ROUTINE W REFLEX MICROSCOPIC
Bilirubin, UA: NEGATIVE
Glucose, UA: NEGATIVE
Ketones, UA: NEGATIVE
Nitrite, UA: NEGATIVE
Protein,UA: NEGATIVE
Specific Gravity, UA: 1.01 (ref 1.005–1.030)
Urobilinogen, Ur: 0.2 mg/dL (ref 0.2–1.0)
pH, UA: 5.5 (ref 5.0–7.5)

## 2021-03-17 LAB — MICROSCOPIC EXAMINATION
Epithelial Cells (non renal): NONE SEEN /hpf (ref 0–10)
Renal Epithel, UA: NONE SEEN /hpf

## 2021-03-17 NOTE — Progress Notes (Signed)
Urological Symptom Review  Patient is experiencing the following symptoms: Frequent urination Hard to postpone urination Burning/pain with urination Get up at night to urinate Urinary tract infection Injury to kidneys/bladder Penile pain (male only)    Review of Systems  Gastrointestinal (upper)  : Negative for upper GI symptoms  Gastrointestinal (lower) : Diarrhea  Constitutional : Weight loss  Skin: Negative for skin symptoms  Eyes: Negative for eye symptoms  Ear/Nose/Throat : Negative for Ear/Nose/Throat symptoms  Hematologic/Lymphatic: Easy bruising  Cardiovascular : Negative for cardiovascular symptoms  Respiratory : Negative for respiratory symptoms  Endocrine: Negative for endocrine symptoms  Musculoskeletal: Negative for musculoskeletal symptoms  Neurological: Negative for neurological symptoms  Psychologic: Negative for psychiatric symptoms

## 2021-03-17 NOTE — Addendum Note (Signed)
Addended by: Dorisann Frames on: 03/17/2021 01:56 PM   Modules accepted: Orders

## 2021-03-17 NOTE — Progress Notes (Signed)
History of Present Illness: Patient returns for follow-up.  Underwent transurethral resection of bladder tumor on November 7.  Pathology-high-grade nonmuscle invasive bladder cancer.  Muscularis present in specimen.  He did not receive gemcitabine.  He has had persistent dysuria, frequency and urgency.  No further gross hematuria.  Past Medical History:  Diagnosis Date   Abnormal liver function tests 05/03/2011   BPH (benign prostatic hypertrophy) 05/03/2011   Elevated transaminase level    GERD (gastroesophageal reflux disease)    Hairy cell leukemia    In remission since 2008   Hairy cell leukemia    1992   Hairy cell leukemia, in remission ([place]) 05/03/2011   IBS (irritable bowel syndrome) 05/03/2011   Irritable bowel syndrome    2 yrs ago, states he had inflammation around anus. Biopsy: inclusive    Past Surgical History:  Procedure Laterality Date   BONE MARROW BIOPSY     2007 and 1992   CATARACT EXTRACTION W/PHACO Right 08/15/2020   Procedure: CATARACT EXTRACTION PHACO AND INTRAOCULAR LENS PLACEMENT RIGHT EYE;  Surgeon: Baruch Goldmann, MD;  Location: [facility];  Service: Ophthalmology;  Laterality: Right;  right CDE=7.75   COLONOSCOPY  04/19/2006   with polyps   COLONOSCOPY  09/30/2011   Procedure: COLONOSCOPY;  Surgeon: Rogene Houston, MD;  [facility];  Service: Endoscopy;  Laterality: N/A;  730   COLONOSCOPY N/A 01/11/2019   Procedure: COLONOSCOPY;  Surgeon: Rogene Houston, MD;  [facility];  Service: Endoscopy;  Laterality: N/A;  100-office move to 9:30am   CYSTOSCOPY W/ RETROGRADES Bilateral 02/23/2021   Procedure: CYSTOSCOPY WITH RETROGRADE PYELOGRAM;  Surgeon: Franchot Gallo, MD;  Location: [facility];  Service: Urology;  Laterality: Bilateral;   HX COLON POLYPS     TONSILLECTOMY     childhood   TRANSFORAMINAL LUMBAR INTERBODY FUSION W/ MIS 1 LEVEL Right 11/18/2020   Procedure: Right Lumbar Five-Sacral One Minimally  invasive transforaminal lumbar interbody fusion;  Surgeon: Judith Part, MD;  Location: [place];  Service: Neurosurgery;  Laterality: Right;   TRANSURETHRAL RESECTION OF BLADDER TUMOR WITH MITOMYCIN-C N/A 02/23/2021   Procedure: TRANSURETHRAL RESECTION OF BLADDER TUMOR;  Surgeon: Franchot Gallo, MD;  Location: [facility];  Service: Urology;  Laterality: N/A;    Home Medications:  Allergies as of 03/17/2021       Reactions   Penicillins Swelling   Did it involve swelling of the face/tongue/throat, SOB, or low BP? No Did it involve sudden or severe rash/hives, skin peeling, or any reaction on the inside of your mouth or nose? No Did you need to seek medical attention at a hospital or doctor's office? Was already inpatient at the time When did it last happen?      2005 If all above answers are "NO", may proceed with cephalosporin use.        Medication List        Accurate as of March 17, 2021 12:03 PM. If you have any questions, ask your nurse or doctor.          STOP taking these medications    phenazopyridine 200 MG tablet Commonly known as: Pyridium Stopped by: Jorja Loa, MD   traMADol 50 MG tablet Commonly known as: Ultram Stopped by: Jorja Loa, MD       TAKE these medications    CHARCOAL ACTIVATED PO Take 520 mg by mouth daily. Over week   Clear Eyes Seasonal Relief 0.012-0.2-0.25 % Soln  Generic drug: Naphazoline-Glycerin-Zinc Sulf Place 1 drop into both eyes daily as needed (allergies).   diphenhydrAMINE 25 mg capsule Commonly known as: BENADRYL Take 25-50 mg by mouth every 6 (six) hours as needed for allergies or sleep.   finasteride 5 MG tablet Commonly known as: PROSCAR Take 1 tablet (5 mg total) by mouth every morning.   FISH OIL OMEGA-3 PO Take 1,000 mg by mouth daily. 300 mg omega 3. Stopped 10 days from.   GINKOBA PO Take 120 mg by mouth daily. Stopped will restart after surgery   ibuprofen 200  MG tablet Commonly known as: ADVIL Take 400 mg by mouth every 6 (six) hours as needed (Inflammation).   psyllium 58.6 % packet Commonly known as: METAMUCIL Take 1 packet by mouth as needed (constipation).   rosuvastatin 10 MG tablet Commonly known as: CRESTOR Take 10 mg by mouth at bedtime.   tamsulosin 0.4 MG Caps capsule Commonly known as: Flomax Take 1 capsule (0.4 mg total) by mouth at bedtime.   Uribel 118 MG Caps Take 1 capsule (118 mg total) by mouth every 8 (eight) hours as needed (Urinary discomfort).        Allergies:  Allergies  Allergen Reactions   Penicillins Swelling    Did it involve swelling of the face/tongue/throat, SOB, or low BP? No Did it involve sudden or severe rash/hives, skin peeling, or any reaction on the inside of your mouth or nose? No Did you need to seek medical attention at a hospital or doctor's office? Was already inpatient at the time When did it last happen?      2005 If all above answers are "NO", may proceed with cephalosporin use.     No family history on file.  Social History:  reports that he quit smoking about 15 years ago. His smoking use included cigarettes. He has never used smokeless tobacco. He reports current alcohol use of about 4.0 standard drinks per week. He reports that he does not use drugs.  ROS: A complete review of systems was performed.  All systems are negative except for pertinent findings as noted.  Physical Exam:  Vital signs in last 24 hours: BP (!) 147/72   Pulse 80  Constitutional:  Alert and oriented, No acute distress Cardiovascular: Regular rate  Respiratory: Normal respiratory effort  Neurologic: Grossly intact, no focal deficits Psychiatric: Normal mood and affect  I have reviewed prior pt notes  I have reviewed urinalysis results  I have independently reviewed prior imaging--CT scan from September  I reviewed pathology results with the patient and his wife     Impression/Assessment:   High-grade nonmuscle invasive bladder cancer, status postinitial resection  Plan:  1.  I spoke with the patient and his wife about repeat procedure with his pathology type and his tumor burden.  We will schedule that in the near future  2.  He does have dysuria and frequency, I told him to stop the Uribel and I gave him samples of Myrbetriq 50 mg to take, 1 a day  3.  I also discussed eventual immunotherapy/BCG with them-we will start that 2 to 3 weeks after his repeat resection.

## 2021-03-18 ENCOUNTER — Other Ambulatory Visit: Payer: Self-pay | Admitting: Urology

## 2021-03-23 ENCOUNTER — Encounter (HOSPITAL_BASED_OUTPATIENT_CLINIC_OR_DEPARTMENT_OTHER): Payer: Self-pay | Admitting: Urology

## 2021-03-24 ENCOUNTER — Other Ambulatory Visit: Payer: Self-pay

## 2021-03-24 ENCOUNTER — Encounter (HOSPITAL_BASED_OUTPATIENT_CLINIC_OR_DEPARTMENT_OTHER): Payer: Self-pay | Admitting: Urology

## 2021-03-24 ENCOUNTER — Other Ambulatory Visit: Payer: Self-pay | Admitting: Urology

## 2021-03-24 DIAGNOSIS — R972 Elevated prostate specific antigen [PSA]: Secondary | ICD-10-CM

## 2021-03-24 NOTE — Progress Notes (Addendum)
Spoke w/ via phone for pre-op interview---pt Lab needs dos---- none              Lab results------none COVID test -----patient states asymptomatic no test needed Arrive at -------0530 on 03/26/21 NPO after MN NO Solid Food.  Clear liquids from MN until---0430 Med rec completed Medications to take morning of surgery -----Proscar, Flomax, Mybetriq, & Clear Eyes drops Diabetic medication -----n/a Patient instructed no nail polish to be worn day of surgery Patient instructed to bring photo id and insurance card day of surgery Patient aware to have Driver (ride ) / caregiver    for 24 hours after surgery - wife Quita Skye Patient Special Instructions -----none Pre-Op special Istructions -----none Patient verbalized understanding of instructions that were given at this phone interview. Patient denies shortness of breath, chest pain, fever, cough at this phone interview.

## 2021-03-25 NOTE — H&P (Signed)
H&P  Chief Complaint: Bladder cancer  History of Present Illness: 70 year old male presents at this time for second stage TURBT.  Initial TURBT performed on 02/23/2021.  Pathology revealed high-grade nonmuscle invasive bladder cancer.  He does have fairly large tumor volume.  He presents at this time for repeat procedure per protocol.  Past Medical History:  Diagnosis Date   Abnormal liver function tests 05/03/2011   Bladder cancer ([place]) 02/2021   high grade nonmuscle invasive bladder cancer   BPH (benign prostatic hypertrophy) 05/03/2011   Elevated transaminase level    GERD (gastroesophageal reflux disease)    Hairy cell leukemia, in remission ([place]) 1992   remission since 2008   IBS (irritable bowel syndrome)    Irritable bowel syndrome    2 yrs ago, states he had inflammation around anus. Biopsy: inclusive   Lumbar foraminal stenosis 12/31/2019   Lumbar radiculopathy 11/15/2019    Past Surgical History:  Procedure Laterality Date   BONE MARROW BIOPSY     2007 and 1992   CATARACT EXTRACTION W/PHACO Right 08/15/2020   Procedure: CATARACT EXTRACTION PHACO AND INTRAOCULAR LENS PLACEMENT RIGHT EYE;  Surgeon: Baruch Goldmann, MD;  Location: [facility];  Service: Ophthalmology;  Laterality: Right;  right CDE=7.75   COLONOSCOPY  04/19/2006   with polyps   COLONOSCOPY  09/30/2011   Procedure: COLONOSCOPY;  Surgeon: Rogene Houston, MD;  [facility];  Service: Endoscopy;  Laterality: N/A;  730   COLONOSCOPY N/A 01/11/2019   Procedure: COLONOSCOPY;  Surgeon: Rogene Houston, MD;  [facility];  Service: Endoscopy;  Laterality: N/A;  100-office move to 9:30am   CYSTOSCOPY W/ RETROGRADES Bilateral 02/23/2021   Procedure: CYSTOSCOPY WITH RETROGRADE PYELOGRAM;  Surgeon: Franchot Gallo, MD;  Location: [facility];  Service: Urology;  Laterality: Bilateral;   HX COLON POLYPS     TONSILLECTOMY     childhood   TRANSFORAMINAL LUMBAR INTERBODY FUSION W/ MIS  1 LEVEL Right 11/18/2020   Procedure: Right Lumbar Five-Sacral One Minimally invasive transforaminal lumbar interbody fusion;  Surgeon: Judith Part, MD;  Location: [place];  Service: Neurosurgery;  Laterality: Right;   TRANSURETHRAL RESECTION OF BLADDER TUMOR WITH MITOMYCIN-C N/A 02/23/2021   Procedure: TRANSURETHRAL RESECTION OF BLADDER TUMOR;  Surgeon: Franchot Gallo, MD;  Location: [facility];  Service: Urology;  Laterality: N/A;    Home Medications:  Allergies as of 03/25/2021       Reactions   Penicillins Swelling   Did it involve swelling of the face/tongue/throat, SOB, or low BP? No Did it involve sudden or severe rash/hives, skin peeling, or any reaction on the inside of your mouth or nose? No Did you need to seek medical attention at a hospital or doctor's office? Was already inpatient at the time When did it last happen?      2005 If all above answers are "NO", may proceed with cephalosporin use.        Medication List      Notice   Cannot display discharge medications because the patient has not yet been admitted.     Allergies:  Allergies  Allergen Reactions   Penicillins Swelling    Did it involve swelling of the face/tongue/throat, SOB, or low BP? No Did it involve sudden or severe rash/hives, skin peeling, or any reaction on the inside of your mouth or nose? No Did you need to seek medical attention at a hospital or doctor's office? Was already inpatient at the time When  did it last happen?      2005 If all above answers are "NO", may proceed with cephalosporin use.     History reviewed. No pertinent family history.  Social History:  reports that he quit smoking about 15 years ago. His smoking use included cigarettes. He has never used smokeless tobacco. He reports current alcohol use of about 4.0 standard drinks per week. He reports that he does not use drugs.  ROS: A complete review of systems was performed.  All systems are  negative except for pertinent findings as noted.  Physical Exam:  Vital signs in last 24 hours: Wt 80.3 kg   BMI 24.01 kg/m  Constitutional:  Alert and oriented, No acute distress Cardiovascular: Regular rate  Respiratory: Normal respiratory effort GI: Abdomen is soft, nontender, nondistended, no abdominal masses. No CVAT.  Genitourinary: Normal male phallus, testes are descended bilaterally and non-tender and without masses, scrotum is normal in appearance without lesions or masses, perineum is normal on inspection. Lymphatic: No lymphadenopathy Neurologic: Grossly intact, no focal deficits Psychiatric: Normal mood and affect  I have reviewed prior pt notes  I have reviewed notes from referring/previous physicians  I have reviewed urinalysis results  I have independently reviewed prior imaging  I have reviewed prior PSA results  I have reviewed prior urine culture   Impression/Assessment:  High-grade nonmuscle invasive bladder cancer  Plan:  Cystoscopy, repeat TURBT, placement of gemcitabine

## 2021-03-25 NOTE — Anesthesia Preprocedure Evaluation (Signed)
Anesthesia Evaluation  Patient identified by MRN, date of birth, ID band Patient awake    Reviewed: Allergy & Precautions, NPO status , Patient's Chart, lab work & pertinent test results  Airway Mallampati: II  TM Distance: >3 FB Neck ROM: Full    Dental  (+) Dental Advisory Given   Pulmonary former smoker,    breath sounds clear to auscultation       Cardiovascular negative cardio ROS   Rhythm:Regular Rate:Normal     Neuro/Psych  Neuromuscular disease    GI/Hepatic Neg liver ROS, GERD  ,  Endo/Other  negative endocrine ROS  Renal/GU negative Renal ROS     Musculoskeletal   Abdominal   Peds  Hematology Leukemia- in remission 2008    Anesthesia Other Findings   Reproductive/Obstetrics                             Anesthesia Physical  Anesthesia Plan  ASA: 3  Anesthesia Plan: General   Post-op Pain Management: Minimal or no pain anticipated   Induction: Intravenous  PONV Risk Score and Plan: 2 and Dexamethasone, Ondansetron and Treatment may vary due to age or medical condition  Airway Management Planned: LMA  Additional Equipment: None  Intra-op Plan:   Post-operative Plan: Extubation in OR  Informed Consent: I have reviewed the patients History and Physical, chart, labs and discussed the procedure including the risks, benefits and alternatives for the proposed anesthesia with the patient or authorized representative who has indicated his/her understanding and acceptance.     Dental advisory given  Plan Discussed with: CRNA  Anesthesia Plan Comments:         Anesthesia Quick Evaluation

## 2021-03-26 ENCOUNTER — Ambulatory Visit (HOSPITAL_BASED_OUTPATIENT_CLINIC_OR_DEPARTMENT_OTHER): Payer: Medicare PPO | Admitting: Anesthesiology

## 2021-03-26 ENCOUNTER — Encounter (HOSPITAL_BASED_OUTPATIENT_CLINIC_OR_DEPARTMENT_OTHER)
Admission: RE | Disposition: A | Discharge status: Home / Self Care | Payer: Self-pay | Source: Home / Self Care | Attending: Urology

## 2021-03-26 ENCOUNTER — Encounter (HOSPITAL_BASED_OUTPATIENT_CLINIC_OR_DEPARTMENT_OTHER): Payer: Self-pay | Admitting: Urology

## 2021-03-26 ENCOUNTER — Ambulatory Visit (HOSPITAL_BASED_OUTPATIENT_CLINIC_OR_DEPARTMENT_OTHER)
Admission: RE | Admit: 2021-03-26 | Discharge: 2021-03-26 | Disposition: A | Discharge status: Home / Self Care | Payer: Medicare PPO | Attending: Urology | Admitting: Urology

## 2021-03-26 DIAGNOSIS — C673 Malignant neoplasm of anterior wall of bladder: Secondary | ICD-10-CM | POA: Diagnosis not present

## 2021-03-26 DIAGNOSIS — N400 Enlarged prostate without lower urinary tract symptoms: Secondary | ICD-10-CM | POA: Diagnosis not present

## 2021-03-26 DIAGNOSIS — Z87891 Personal history of nicotine dependence: Secondary | ICD-10-CM | POA: Insufficient documentation

## 2021-03-26 DIAGNOSIS — C9591 Leukemia, unspecified, in remission: Secondary | ICD-10-CM | POA: Diagnosis not present

## 2021-03-26 DIAGNOSIS — K219 Gastro-esophageal reflux disease without esophagitis: Secondary | ICD-10-CM | POA: Insufficient documentation

## 2021-03-26 DIAGNOSIS — C679 Malignant neoplasm of bladder, unspecified: Secondary | ICD-10-CM | POA: Insufficient documentation

## 2021-03-26 DIAGNOSIS — N329 Bladder disorder, unspecified: Secondary | ICD-10-CM | POA: Diagnosis not present

## 2021-03-26 DIAGNOSIS — G709 Myoneural disorder, unspecified: Secondary | ICD-10-CM | POA: Insufficient documentation

## 2021-03-26 DIAGNOSIS — K589 Irritable bowel syndrome without diarrhea: Secondary | ICD-10-CM | POA: Diagnosis not present

## 2021-03-26 DIAGNOSIS — C678 Malignant neoplasm of overlapping sites of bladder: Secondary | ICD-10-CM

## 2021-03-26 HISTORY — PX: TRANSURETHRAL RESECTION OF BLADDER TUMOR: SHX2575

## 2021-03-26 SURGERY — TURBT (TRANSURETHRAL RESECTION OF BLADDER TUMOR)
Anesthesia: General | Site: Bladder

## 2021-03-26 MED ORDER — FENTANYL CITRATE (PF) 100 MCG/2ML IJ SOLN
INTRAMUSCULAR | Status: AC
  Filled 2021-03-26: qty 2

## 2021-03-26 MED ORDER — FENTANYL CITRATE (PF) 100 MCG/2ML IJ SOLN
INTRAMUSCULAR | Status: DC | PRN
  Administered 2021-03-26: 50 ug via INTRAVENOUS
  Administered 2021-03-26 (×2): 25 ug via INTRAVENOUS

## 2021-03-26 MED ORDER — PROPOFOL 10 MG/ML IV BOLUS
INTRAVENOUS | Status: AC
  Filled 2021-03-26: qty 20

## 2021-03-26 MED ORDER — EPHEDRINE SULFATE 50 MG/ML IJ SOLN
INTRAMUSCULAR | Status: DC | PRN
  Administered 2021-03-26 (×2): 20 mg via INTRAVENOUS

## 2021-03-26 MED ORDER — AMISULPRIDE (ANTIEMETIC) 5 MG/2ML IV SOLN: 10.0000 mg | Freq: Once | INTRAVENOUS | Status: DC | PRN

## 2021-03-26 MED ORDER — PROPOFOL 10 MG/ML IV BOLUS
INTRAVENOUS | Status: DC | PRN
  Administered 2021-03-26 (×2): 50 mg via INTRAVENOUS
  Administered 2021-03-26: 150 mg via INTRAVENOUS
  Administered 2021-03-26 (×2): 50 mg via INTRAVENOUS

## 2021-03-26 MED ORDER — ONDANSETRON HCL 4 MG/2ML IJ SOLN
INTRAMUSCULAR | Status: AC
  Filled 2021-03-26: qty 2

## 2021-03-26 MED ORDER — CIPROFLOXACIN IN D5W 400 MG/200ML IV SOLN
INTRAVENOUS | Status: AC
  Filled 2021-03-26: qty 200

## 2021-03-26 MED ORDER — LIDOCAINE 2% (20 MG/ML) 5 ML SYRINGE
INTRAMUSCULAR | Status: DC | PRN
  Administered 2021-03-26: 60 mg via INTRAVENOUS

## 2021-03-26 MED ORDER — FENTANYL CITRATE (PF) 100 MCG/2ML IJ SOLN
25.0000 ug | INTRAMUSCULAR | Status: DC | PRN
Start: 2021-03-26 — End: 2021-03-27
  Administered 2021-03-26 (×2): 25 ug via INTRAVENOUS

## 2021-03-26 MED ORDER — CEPHALEXIN 500 MG PO CAPS: 500.0000 mg | ORAL_CAPSULE | Freq: Two times a day (BID) | ORAL | 0 refills | Status: AC

## 2021-03-26 MED ORDER — STERILE WATER FOR IRRIGATION IR SOLN
Status: DC | PRN
  Administered 2021-03-26: 500 mL

## 2021-03-26 MED ORDER — GEMCITABINE CHEMO FOR BLADDER INSTILLATION 2000 MG
2000.0000 mg | Freq: Once | INTRAVENOUS | Status: AC
  Administered 2021-03-26: 2000 mg via INTRAVESICAL
  Filled 2021-03-26: qty 2000

## 2021-03-26 MED ORDER — SODIUM CHLORIDE 0.9 % IR SOLN
Status: DC | PRN
  Administered 2021-03-26: 3000 mL
  Administered 2021-03-26: 6000 mL

## 2021-03-26 MED ORDER — LIDOCAINE 2% (20 MG/ML) 5 ML SYRINGE
INTRAMUSCULAR | Status: AC
  Filled 2021-03-26: qty 5

## 2021-03-26 MED ORDER — LACTATED RINGERS IV SOLN: INTRAVENOUS | Status: DC

## 2021-03-26 MED ORDER — DEXAMETHASONE SODIUM PHOSPHATE 4 MG/ML IJ SOLN
INTRAMUSCULAR | Status: DC | PRN
  Administered 2021-03-26: 5 mg via INTRAVENOUS

## 2021-03-26 MED ORDER — CIPROFLOXACIN IN D5W 400 MG/200ML IV SOLN
400.0000 mg | INTRAVENOUS | Status: AC
  Administered 2021-03-26: 400 mg via INTRAVENOUS

## 2021-03-26 MED ORDER — EPHEDRINE 5 MG/ML INJ
INTRAVENOUS | Status: AC
  Filled 2021-03-26: qty 5

## 2021-03-26 MED ORDER — ONDANSETRON HCL 4 MG/2ML IJ SOLN
INTRAMUSCULAR | Status: DC | PRN
  Administered 2021-03-26: 8 mg via INTRAVENOUS

## 2021-03-26 MED ORDER — 0.9 % SODIUM CHLORIDE (POUR BTL) OPTIME
TOPICAL | Status: DC | PRN
  Administered 2021-03-26: 500 mL

## 2021-03-26 MED ORDER — DEXAMETHASONE SODIUM PHOSPHATE 10 MG/ML IJ SOLN
INTRAMUSCULAR | Status: AC
  Filled 2021-03-26: qty 1

## 2021-03-26 SURGICAL SUPPLY — 26 items
BAG DRAIN URO-CYSTO SKYTR STRL (DRAIN) ×2 IMPLANT
BAG DRN RND TRDRP ANRFLXCHMBR (UROLOGICAL SUPPLIES) ×1
BAG DRN UROCATH (DRAIN) ×1
BAG URINE DRAIN 2000ML AR STRL (UROLOGICAL SUPPLIES) ×2 IMPLANT
CATH FOLEY 2WAY SLVR  5CC 18FR (CATHETERS) ×2
CATH FOLEY 2WAY SLVR 5CC 18FR (CATHETERS) ×1 IMPLANT
CLOTH BEACON ORANGE TIMEOUT ST (SAFETY) ×2 IMPLANT
ELECT REM PT RETURN 9FT ADLT (ELECTROSURGICAL) ×2
ELECTRODE REM PT RTRN 9FT ADLT (ELECTROSURGICAL) ×1 IMPLANT
GLOVE SURG ENC MOIS LTX SZ8 (GLOVE) ×2 IMPLANT
GLOVE SURG LTX SZ6.5 (GLOVE) ×2 IMPLANT
GLOVE SURG UNDER POLY LF SZ6.5 (GLOVE) ×2 IMPLANT
GOWN STRL REUS W/TWL LRG LVL3 (GOWN DISPOSABLE) ×2 IMPLANT
GOWN STRL REUS W/TWL XL LVL3 (GOWN DISPOSABLE) ×2 IMPLANT
HOLDER FOLEY CATH W/STRAP (MISCELLANEOUS) ×2 IMPLANT
IV NS IRRIG 3000ML ARTHROMATIC (IV SOLUTION) ×6 IMPLANT
KIT TURNOVER CYSTO (KITS) ×2 IMPLANT
LOOP CUT BIPOLAR 24F LRG (ELECTROSURGICAL) ×2 IMPLANT
MANIFOLD NEPTUNE II (INSTRUMENTS) ×2 IMPLANT
NS IRRIG 500ML POUR BTL (IV SOLUTION) ×2 IMPLANT
PACK CYSTO (CUSTOM PROCEDURE TRAY) ×2 IMPLANT
SYR TOOMEY IRRIG 70ML (MISCELLANEOUS) ×2
SYRINGE TOOMEY IRRIG 70ML (MISCELLANEOUS) ×1 IMPLANT
TUBE CONNECTING 12X1/4 (SUCTIONS) ×2 IMPLANT
TUBING UROLOGY SET (TUBING) ×2 IMPLANT
WATER STERILE IRR 500ML POUR (IV SOLUTION) ×2 IMPLANT

## 2021-03-26 NOTE — Anesthesia Postprocedure Evaluation (Signed)
Anesthesia Post Note  Patient: Cameron Wu  Procedure(s) Performed: REPEAT TRANSURETHRAL RESECTION OF BLADDER TUMOR (TURBT)/ POST OPERATIVE INSTILLATION OF GEMCITABINE (Bladder)     Patient location during evaluation: PACU Anesthesia Type: General Level of consciousness: awake and alert Pain management: pain level controlled Vital Signs Assessment: post-procedure vital signs reviewed and stable Respiratory status: spontaneous breathing, nonlabored ventilation, respiratory function stable and patient connected to nasal cannula oxygen Cardiovascular status: blood pressure returned to baseline and stable Postop Assessment: no apparent nausea or vomiting Anesthetic complications: no   No notable events documented.  Last Vitals:  Vitals:   03/26/21 1015 03/26/21 1102  BP: 130/75 (!) 148/69  Pulse: 70 75  Resp: 14 17  Temp:    SpO2: 99% 100%    Last Pain:  Vitals:   03/26/21 1000  TempSrc:   PainSc: 3                  Tiajuana Amass

## 2021-03-26 NOTE — Interval H&P Note (Signed)
History and Physical Interval Note:  03/26/2021 7:32 AM  Cameron Wu  has presented today for surgery, with the diagnosis of BLADDER CANCER.  The various methods of treatment have been discussed with the patient and family. After consideration of risks, benefits and other options for treatment, the patient has consented to  Procedure(s): REPEAT TRANSURETHRAL RESECTION OF BLADDER TUMOR (TURBT) (N/A) as a surgical intervention.  The patient's history has been reviewed, patient examined, no change in status, stable for surgery.  I have reviewed the patient's chart and labs.  Questions were answered to the patient's satisfaction.     Lillette Boxer Jacksyn Beeks

## 2021-03-26 NOTE — Transfer of Care (Signed)
Immediate Anesthesia Transfer of Care Note  Patient: Cameron Wu  Procedure(s) Performed: Procedure(s) (LRB): REPEAT TRANSURETHRAL RESECTION OF BLADDER TUMOR (TURBT)/ POST OPERATIVE INSTILLATION OF GEMCITABINE (N/A)  Patient Location: PACU  Anesthesia Type: General  Level of Consciousness: awake, sedated, patient cooperative and responds to stimulation  Airway & Oxygen Therapy: Patient Spontanous Breathing and Patient connected to [place] 02 and soft FM   Post-op Assessment: Report given to PACU RN, Post -op Vital signs reviewed and stable and Patient moving all extremities  Post vital signs: Reviewed and stable  Complications: No apparent anesthesia complications

## 2021-03-26 NOTE — Anesthesia Procedure Notes (Signed)
Procedure Name: LMA Insertion Date/Time: 03/26/2021 7:49 AM Performed by: Justice Rocher, CRNA Pre-anesthesia Checklist: Patient identified, Emergency Drugs available, Suction available, Patient being monitored and Timeout performed Patient Re-evaluated:Patient Re-evaluated prior to induction Oxygen Delivery Method: Circle system utilized Preoxygenation: Pre-oxygenation with 100% oxygen Induction Type: IV induction Ventilation: Mask ventilation without difficulty LMA: LMA inserted LMA Size: 4.0 Number of attempts: 1 Airway Equipment and Method: Bite block Placement Confirmation: positive ETCO2, breath sounds checked- equal and bilateral and CO2 detector Tube secured with: Tape Dental Injury: Teeth and Oropharynx as per pre-operative assessment

## 2021-03-26 NOTE — Discharge Instructions (Addendum)
You may see some blood in the urine and may have some burning with urination for 48-72 hours. You also may notice that you have to urinate more frequently or urgently after your procedure which is normal.  You should call should you develop an inability urinate, fever > 101, persistent nausea and vomiting that prevents you from eating or drinking to stay hydrated.  If you have a catheter, you will be taught how to take care of the catheter by the nursing staff prior to discharge from the hospital.  You may periodically feel a strong urge to void with the catheter in place.  This is a bladder spasm and most often can occur when having a bowel movement or moving around. It is typically self-limited and usually will stop after a few minutes.  You may use some Vaseline or Neosporin around the tip of the catheter to reduce friction at the tip of the penis. You may also see some blood in the urine.  A very small amount of blood can make the urine look quite red.  As long as the catheter is draining well, there usually is not a problem.  However, if the catheter is not draining well and is bloody, you should call the office [phone] to notify us.  It is okay to remove the catheter on Friday morning.      Post Anesthesia Home Care Instructions  Activity: Get plenty of rest for the remainder of the day. A responsible individual must stay with you for 24 hours following the procedure.  For the next 24 hours, DO NOT: -Drive a car -Paediatric nurse -Drink alcoholic beverages -Take any medication unless instructed by your physician -Make any legal decisions or sign important papers.  Meals: Start with liquid foods such as gelatin or soup. Progress to regular foods as tolerated. Avoid greasy, spicy, heavy foods. If nausea and/or vomiting occur, drink only clear liquids until the nausea and/or vomiting subsides. Call your physician if vomiting continues.  Special Instructions/Symptoms: Your throat may  feel dry or sore from the anesthesia or the breathing tube placed in your throat during surgery. If this causes discomfort, gargle with warm salt water. The discomfort should disappear within 24 hours.

## 2021-03-26 NOTE — Op Note (Signed)
Preoperative diagnosis: History of high-grade nonmuscle invasive bladder cancer, status post recent resection  Postoperative diagnosis: Same, with evidence of small papillary lesion not noted during initial resection  Principal procedure: TURBT of 8 mm papillary lesion, repeat resection of prior tumor site, placement of intravesical gemcitabine  Surgeon: Celester Morgan  Anesthesia: General with LMA  Complications: None  Estimated blood loss: Less than 10 mL  Specimen: #1 bladder tumor #2 prior resection site biopsies  Drains: 18 French Foley catheter  Indications: 70 year old male status post transurethral resection of large bladder tumor approximately 1 month ago.  Pathology revealed high-grade nonmuscle invasive bladder cancer.  He has been doing well since that procedure.  He presents at this time, per protocol, for repeat resection with possible gemcitabine instillation.  Findings: Urethra was normal.  Prostate nonobstructive.  Prior resection site was healing.  Fibrinous material coated approximately half of the old resection site.  1 small papillary lesion on the left anterior bladder wall.  Ureteral orifice ease normal.  No other urothelial abnormalities noted.  Description of procedure: The patient was properly identified in the holding area.  Is taken to the operating room where general anesthetic was administered with the LMA.  He was placed in the dorsolithotomy position.  Genitalia and perineum were prepped, draped, proper timeout performed.  26 resectoscope sheath was placed using the visual obturator and a 30 degree lens.  Bladder was easily entered, circumferentially inspected with the above-mentioned findings.  I then placed the resectoscope and the cutting/bipolar loop.  Small papillary lesion was first resected and sent separately as specimen #1.  I then resected, starting at the edges, the old resection site.  Care was taken to stay within the muscular layer.  Following this,  biopsy fragments were irrigated from the bladder and sent, labeled "prior resection site biopsies".  The entire area was then cauterized, hemostasis was adequate with the irrigation stopped.  Once all fragments were removed, and repeat inspection showed hemostasis, the scope was removed.  25 French Foley catheter placed, balloon filled with 10 cc of water.  Bladder was drained, catheter hooked to dependent drainage.  The patient was then awakened and taken to the PACU in stable condition, having tolerated procedure well.  In the PACU, 2 g of gemcitabine was placed.  It was left indwelling for 1 hour then drained.

## 2021-03-27 ENCOUNTER — Encounter (HOSPITAL_BASED_OUTPATIENT_CLINIC_OR_DEPARTMENT_OTHER): Payer: Self-pay | Admitting: Urology

## 2021-03-30 DIAGNOSIS — R238 Other skin changes: Secondary | ICD-10-CM | POA: Diagnosis not present

## 2021-03-31 LAB — SURGICAL PATHOLOGY

## 2021-04-08 DIAGNOSIS — C679 Malignant neoplasm of bladder, unspecified: Secondary | ICD-10-CM | POA: Diagnosis not present

## 2021-04-08 DIAGNOSIS — Z87891 Personal history of nicotine dependence: Secondary | ICD-10-CM | POA: Diagnosis not present

## 2021-04-08 DIAGNOSIS — Z825 Family history of asthma and other chronic lower respiratory diseases: Secondary | ICD-10-CM | POA: Diagnosis not present

## 2021-04-08 DIAGNOSIS — Z811 Family history of alcohol abuse and dependence: Secondary | ICD-10-CM | POA: Diagnosis not present

## 2021-04-08 DIAGNOSIS — N400 Enlarged prostate without lower urinary tract symptoms: Secondary | ICD-10-CM | POA: Diagnosis not present

## 2021-04-08 DIAGNOSIS — Z809 Family history of malignant neoplasm, unspecified: Secondary | ICD-10-CM | POA: Diagnosis not present

## 2021-04-08 DIAGNOSIS — K5900 Constipation, unspecified: Secondary | ICD-10-CM | POA: Diagnosis not present

## 2021-04-08 DIAGNOSIS — H04129 Dry eye syndrome of unspecified lacrimal gland: Secondary | ICD-10-CM | POA: Diagnosis not present

## 2021-04-18 NOTE — Progress Notes (Signed)
History of Present Illness: Here for followup of bladder cancer.  He is here for first BCG treatment.  He still has a little bit of dysuria but no gross hematuria.    Past Medical History:  Diagnosis Date   Abnormal liver function tests 05/03/2011   Bladder cancer ([place]) 02/2021   high grade nonmuscle invasive bladder cancer   BPH (benign prostatic hypertrophy) 05/03/2011   Elevated transaminase level    GERD (gastroesophageal reflux disease)    Hairy cell leukemia, in remission ([place]) 1992   remission since 2008   IBS (irritable bowel syndrome)    Irritable bowel syndrome    2 yrs ago, states he had inflammation around anus. Biopsy: inclusive   Lumbar foraminal stenosis 12/31/2019   Lumbar radiculopathy 11/15/2019    Past Surgical History:  Procedure Laterality Date   BONE MARROW BIOPSY     2007 and 1992   CATARACT EXTRACTION W/PHACO Right 08/15/2020   Procedure: CATARACT EXTRACTION PHACO AND INTRAOCULAR LENS PLACEMENT RIGHT EYE;  Surgeon: Baruch Goldmann, MD;  [facility]: [facility];  Service: Ophthalmology;  Laterality: Right;  right CDE=7.75   COLONOSCOPY  04/19/2006   with polyps   COLONOSCOPY  09/30/2011   Procedure: COLONOSCOPY;  Surgeon: Rogene Houston, MD;  [facility];  Service: Endoscopy;  Laterality: N/A;  730   COLONOSCOPY N/A 01/11/2019   Procedure: COLONOSCOPY;  Surgeon: Rogene Houston, MD;  [facility];  Service: Endoscopy;  Laterality: N/A;  100-office move to 9:30am   CYSTOSCOPY W/ RETROGRADES Bilateral 02/23/2021   Procedure: CYSTOSCOPY WITH RETROGRADE PYELOGRAM;  Surgeon: Franchot Gallo, MD;  Location: [facility];  Service: Urology;  Laterality: Bilateral;   HX COLON POLYPS     TONSILLECTOMY     childhood   TRANSFORAMINAL LUMBAR INTERBODY FUSION W/ MIS 1 LEVEL Right 11/18/2020   Procedure: Right Lumbar Five-Sacral One Minimally invasive transforaminal lumbar interbody fusion;  Surgeon: Judith Part, MD;   Location: [place];  Service: Neurosurgery;  Laterality: Right;   TRANSURETHRAL RESECTION OF BLADDER TUMOR N/A 03/26/2021   Procedure: REPEAT TRANSURETHRAL RESECTION OF BLADDER TUMOR (TURBT)/ POST OPERATIVE INSTILLATION OF GEMCITABINE;  Surgeon: Franchot Gallo, MD;  [facility]: [facility];  Service: Urology;  Laterality: N/A;   TRANSURETHRAL RESECTION OF BLADDER TUMOR WITH MITOMYCIN-C N/A 02/23/2021   Procedure: TRANSURETHRAL RESECTION OF BLADDER TUMOR;  Surgeon: Franchot Gallo, MD;  Location[facility];  Service: Urology;  Laterality: N/A;    Home Medications:  Allergies as of 04/21/2021       Reactions   Penicillins Swelling   Did it involve swelling of the face/tongue/throat, SOB, or low BP? No Did it involve sudden or severe rash/hives, skin peeling, or any reaction on the inside of your mouth or nose? No Did you need to seek medical attention at a hospital or doctor's office? Was already inpatient at the time When did it last happen?      2005 If all above answers are NO, may proceed with cephalosporin use.        Medication List        Accurate as of April 18, 2021  5:22 PM. If you have any questions, ask your nurse or doctor.          CHARCOAL ACTIVATED PO Take 520 mg by mouth daily. Over week   Clear Eyes Seasonal Relief 0.012-0.2-0.25 % Soln Generic drug: Naphazoline-Glycerin-Zinc Sulf Place 1 drop into both eyes daily as needed (allergies).  diphenhydrAMINE 25 mg capsule Commonly known as: BENADRYL Take 25-50 mg by mouth every 6 (six) hours as needed for allergies or sleep.   finasteride 5 MG tablet Commonly known as: PROSCAR TAKE ONE TABLET BY MOUTH EVERY MORNING   FISH OIL OMEGA-3 PO Take 1,000 mg by mouth daily. 300 mg omega 3. Stopped on 03/13/21 for 03/26/21 surgery.   GINKOBA PO Take 120 mg by mouth daily. Stopped will restart after surgery   ibuprofen 200 MG tablet Commonly known as: ADVIL Take 400 mg by mouth  every 6 (six) hours as needed (Inflammation).   mirabegron ER 50 MG Tb24 tablet Commonly known as: MYRBETRIQ Take 50 mg by mouth daily.   psyllium 58.6 % packet Commonly known as: METAMUCIL Take 1 packet by mouth as needed (constipation).   rosuvastatin 10 MG tablet Commonly known as: CRESTOR Take 10 mg by mouth at bedtime.   tamsulosin 0.4 MG Caps capsule Commonly known as: FLOMAX TAKE ONE CAPSULE BY MOUTH EVERY NIGHT AT BEDTIME   Uribel 118 MG Caps Take 1 capsule (118 mg total) by mouth every 8 (eight) hours as needed (Urinary discomfort).        Allergies:  Allergies  Allergen Reactions   Penicillins Swelling    Did it involve swelling of the face/tongue/throat, SOB, or low BP? No Did it involve sudden or severe rash/hives, skin peeling, or any reaction on the inside of your mouth or nose? No Did you need to seek medical attention at a hospital or doctor's office? Was already inpatient at the time When did it last happen?      2005 If all above answers are NO, may proceed with cephalosporin use.     No family history on file.  Social History:  reports that he quit smoking about 15 years ago. His smoking use included cigarettes. He has never used smokeless tobacco. He reports current alcohol use of about 4.0 standard drinks per week. He reports that he does not use drugs.  ROS: A complete review of systems was performed.  All systems are negative except for pertinent findings as noted.    I have reviewed prior pt notes  I have reviewed urinalysis results  I have reviewed prior pathology results    Impression/Assessment:  High-grade nonmuscle invasive bladder cancer, status post initial and secondary resection, here for BCG initiation  Plan:  1.  BCG administered today  2.  Office visit in 1 week for second of 6 treatments  3.  I will have his next cystoscopy performed between 8 and 10 weeks.

## 2021-04-21 ENCOUNTER — Encounter: Payer: Self-pay | Admitting: Urology

## 2021-04-21 ENCOUNTER — Ambulatory Visit (INDEPENDENT_AMBULATORY_CARE_PROVIDER_SITE_OTHER): Payer: Medicare PPO | Admitting: Urology

## 2021-04-21 VITALS — BP 158/73 | HR 92

## 2021-04-21 DIAGNOSIS — C678 Malignant neoplasm of overlapping sites of bladder: Secondary | ICD-10-CM | POA: Diagnosis not present

## 2021-04-21 LAB — URINALYSIS, ROUTINE W REFLEX MICROSCOPIC
Bilirubin, UA: NEGATIVE
Glucose, UA: NEGATIVE
Nitrite, UA: NEGATIVE
Specific Gravity, UA: 1.025 (ref 1.005–1.030)
Urobilinogen, Ur: 0.2 mg/dL (ref 0.2–1.0)
pH, UA: 5.5 (ref 5.0–7.5)

## 2021-04-21 LAB — MICROSCOPIC EXAMINATION
Epithelial Cells (non renal): >10 /hpf — AB (ref 0–10)
RBC, Urine: >30 /hpf — AB (ref 0–2)
Renal Epithel, UA: NONE SEEN /hpf

## 2021-04-21 MED ORDER — BCG LIVE 50 MG IS SUSR
3.2400 mL | Freq: Once | INTRAVESICAL | Status: AC
  Administered 2021-04-21: 81 mg via INTRAVESICAL

## 2021-04-21 NOTE — Patient Instructions (Signed)

## 2021-04-21 NOTE — Addendum Note (Signed)
Addended by: Tyrone Apple on: 04/21/2021 03:08 PM   Modules accepted: Orders

## 2021-04-21 NOTE — Progress Notes (Signed)
BCG Bladder Instillation  BCG # 1 of 6  Due to Bladder Cancer patient is present today for a BCG treatment. Patient was cleaned and prepped in a sterile fashion with betadine. A FR catheter was inserted, urine return was noted 36ml, urine was Yellow in color.  15ml of reconstituted BCG was instilled into the bladder. The catheter was then removed. Patient tolerated well, no complications were noted  Performed by: Janett Billow

## 2021-04-22 DIAGNOSIS — Z79899 Other long term (current) drug therapy: Secondary | ICD-10-CM | POA: Diagnosis not present

## 2021-04-22 DIAGNOSIS — I700 Atherosclerosis of aorta: Secondary | ICD-10-CM | POA: Diagnosis not present

## 2021-04-26 ENCOUNTER — Encounter: Payer: Self-pay | Admitting: Urology

## 2021-04-29 ENCOUNTER — Ambulatory Visit (INDEPENDENT_AMBULATORY_CARE_PROVIDER_SITE_OTHER): Payer: Medicare PPO

## 2021-04-29 ENCOUNTER — Other Ambulatory Visit: Payer: Self-pay

## 2021-04-29 VITALS — BP 148/81 | HR 91 | Wt 176.0 lb

## 2021-04-29 DIAGNOSIS — C678 Malignant neoplasm of overlapping sites of bladder: Secondary | ICD-10-CM | POA: Diagnosis not present

## 2021-04-29 DIAGNOSIS — C679 Malignant neoplasm of bladder, unspecified: Secondary | ICD-10-CM | POA: Diagnosis not present

## 2021-04-29 DIAGNOSIS — I700 Atherosclerosis of aorta: Secondary | ICD-10-CM | POA: Diagnosis not present

## 2021-04-29 LAB — URINALYSIS, ROUTINE W REFLEX MICROSCOPIC
Bilirubin, UA: NEGATIVE
Glucose, UA: NEGATIVE
Ketones, UA: NEGATIVE
Nitrite, UA: NEGATIVE
Protein,UA: NEGATIVE
Specific Gravity, UA: <1.005 — ABNORMAL LOW (ref 1.005–1.030)
Urobilinogen, Ur: 0.2 mg/dL (ref 0.2–1.0)
pH, UA: 6 (ref 5.0–7.5)

## 2021-04-29 MED ORDER — BCG LIVE 50 MG IS SUSR
3.2400 mL | Freq: Once | INTRAVESICAL | Status: AC
  Administered 2021-04-29: 81 mg via INTRAVESICAL

## 2021-04-29 NOTE — Patient Instructions (Signed)

## 2021-04-29 NOTE — Progress Notes (Signed)
BCG Bladder Instillation  BCG # 2 of 6  Due to Bladder Cancer patient is present today for a BCG treatment. Patient was cleaned and prepped in a sterile fashion with betadine. A 14FR catheter was inserted, urine return was noted 53ml, urine was yellow in color.  73ml of reconstituted BCG was instilled into the bladder. The catheter was then removed. Patient tolerated well, no complications were noted  Performed by: Janett Billow CMA  Follow up/ Additional notes: Weekly BCG

## 2021-05-06 ENCOUNTER — Other Ambulatory Visit: Payer: Self-pay

## 2021-05-06 ENCOUNTER — Ambulatory Visit (INDEPENDENT_AMBULATORY_CARE_PROVIDER_SITE_OTHER): Payer: Medicare PPO

## 2021-05-06 DIAGNOSIS — C678 Malignant neoplasm of overlapping sites of bladder: Secondary | ICD-10-CM

## 2021-05-06 LAB — URINALYSIS, ROUTINE W REFLEX MICROSCOPIC
Bilirubin, UA: NEGATIVE
Glucose, UA: NEGATIVE
Ketones, UA: NEGATIVE
Nitrite, UA: NEGATIVE
Protein,UA: NEGATIVE
Specific Gravity, UA: 1.01 (ref 1.005–1.030)
Urobilinogen, Ur: 0.2 mg/dL (ref 0.2–1.0)
pH, UA: 5.5 (ref 5.0–7.5)

## 2021-05-06 MED ORDER — BCG LIVE 50 MG IS SUSR
3.2400 mL | Freq: Once | INTRAVESICAL | Status: AC
  Administered 2021-05-06: 81 mg via INTRAVESICAL

## 2021-05-06 NOTE — Progress Notes (Signed)
BCG Bladder Instillation  BCG # 3 of 6  Due to Bladder Cancer patient is present today for a BCG treatment. Patient was cleaned and prepped in a sterile fashion with betadine. A 14FR catheter was inserted, urine return was noted 181ml, urine was yellow in color.  65ml of reconstituted BCG was instilled into the bladder. The catheter was then removed. Patient tolerated well, no complications were noted  Performed by: Tamyra Fojtik LPN   Follow up/ Additional notes: Keep scheduled NV

## 2021-05-06 NOTE — Patient Instructions (Signed)

## 2021-05-13 ENCOUNTER — Ambulatory Visit (INDEPENDENT_AMBULATORY_CARE_PROVIDER_SITE_OTHER): Payer: Medicare PPO

## 2021-05-13 ENCOUNTER — Other Ambulatory Visit: Payer: Self-pay

## 2021-05-13 DIAGNOSIS — C678 Malignant neoplasm of overlapping sites of bladder: Secondary | ICD-10-CM | POA: Diagnosis not present

## 2021-05-13 MED ORDER — BCG LIVE 50 MG IS SUSR
3.2400 mL | Freq: Once | INTRAVESICAL | Status: AC
  Administered 2021-05-13: 09:00:00 81 mg via INTRAVESICAL

## 2021-05-13 NOTE — Progress Notes (Signed)
BCG Bladder Instillation  BCG # 4 of 6  Due to Bladder Cancer patient is present today for a BCG treatment. Patient was cleaned and prepped in a sterile fashion with betadine. A 14FR catheter was inserted, urine return was noted 8ml, urine was yellow in color.  65ml of reconstituted BCG was instilled into the bladder. The catheter was then removed. Patient tolerated well, no complications were noted  Performed by: Estill Bamberg RN  Follow up/ Additional notes: 1 week BCG

## 2021-05-14 DIAGNOSIS — L570 Actinic keratosis: Secondary | ICD-10-CM | POA: Diagnosis not present

## 2021-05-14 DIAGNOSIS — L820 Inflamed seborrheic keratosis: Secondary | ICD-10-CM | POA: Diagnosis not present

## 2021-05-14 DIAGNOSIS — L578 Other skin changes due to chronic exposure to nonionizing radiation: Secondary | ICD-10-CM | POA: Diagnosis not present

## 2021-05-14 DIAGNOSIS — L821 Other seborrheic keratosis: Secondary | ICD-10-CM | POA: Diagnosis not present

## 2021-05-14 DIAGNOSIS — D225 Melanocytic nevi of trunk: Secondary | ICD-10-CM | POA: Diagnosis not present

## 2021-05-14 DIAGNOSIS — Z808 Family history of malignant neoplasm of other organs or systems: Secondary | ICD-10-CM | POA: Diagnosis not present

## 2021-05-18 ENCOUNTER — Other Ambulatory Visit: Payer: Self-pay

## 2021-05-18 ENCOUNTER — Ambulatory Visit: Payer: Medicare PPO | Admitting: Physician Assistant

## 2021-05-18 VITALS — BP 154/72 | HR 96

## 2021-05-18 DIAGNOSIS — C678 Malignant neoplasm of overlapping sites of bladder: Secondary | ICD-10-CM | POA: Diagnosis not present

## 2021-05-18 LAB — MICROSCOPIC EXAMINATION: Renal Epithel, UA: NONE SEEN /hpf

## 2021-05-18 LAB — URINALYSIS, ROUTINE W REFLEX MICROSCOPIC
Bilirubin, UA: NEGATIVE
Glucose, UA: NEGATIVE
Ketones, UA: NEGATIVE
Nitrite, UA: NEGATIVE
Protein,UA: NEGATIVE
Specific Gravity, UA: 1.015 (ref 1.005–1.030)
Urobilinogen, Ur: 0.2 mg/dL (ref 0.2–1.0)
pH, UA: 5.5 (ref 5.0–7.5)

## 2021-05-18 MED ORDER — BCG LIVE 50 MG IS SUSR
3.2400 mL | Freq: Once | INTRAVESICAL | Status: AC
  Administered 2021-05-18: 81 mg via INTRAVESICAL

## 2021-05-18 NOTE — Progress Notes (Signed)
Assessment: 1. Malignant neoplasm of overlapping sites of bladder ([place])     Plan: FU next week as scheduled for BCG Tx # 6/6.   Chief Complaint: BCG 5/6  HPI: 05/18/21  Cameron Wu is a 71 y.o. male who presents for continued evaluation of bladder CA. He is scheduled for BCG Tx #5/6. He denies fever, nausea, abdominal pain, dysuria, hematuria. He is tolerating treatments well.  UA 11-30 WBC, 11/30 RBC, few bacteria   03/26/2021  Cameron Wu  has presented today for surgery, with the diagnosis of BLADDER CANCER.  The various methods of treatment have been discussed with the patient and family. After consideration of risks, benefits and other options for treatment, the patient has consented to  Procedure(s): REPEAT TRANSURETHRAL RESECTION OF BLADDER TUMOR (TURBT) (N/A) as a surgical intervention.  The patient's history has been reviewed, patient examined, no change in status, stable for surgery.  I have reviewed the patient's chart and labs.  Questions were answered to the patient's satisfaction.    Portions of the above documentation were copied from a prior visit for review purposes only.  Allergies: Allergies  Allergen Reactions   Penicillins Swelling    Did it involve swelling of the face/tongue/throat, SOB, or low BP? No Did it involve sudden or severe rash/hives, skin peeling, or any reaction on the inside of your mouth or nose? No Did you need to seek medical attention at a hospital or doctor's office? Was already inpatient at the time When did it last happen?      2005 If all above answers are NO, may proceed with cephalosporin use.     PMH: Past Medical History:  Diagnosis Date   Abnormal liver function tests 05/03/2011   Bladder cancer ([place]) 02/2021   high grade nonmuscle invasive bladder cancer   BPH (benign prostatic hypertrophy) 05/03/2011   Elevated transaminase level    GERD (gastroesophageal reflux disease)    Hairy cell leukemia, in  remission ([place]) 1992   remission since 2008   IBS (irritable bowel syndrome)    Irritable bowel syndrome    2 yrs ago, states he had inflammation around anus. Biopsy: inclusive   Lumbar foraminal stenosis 12/31/2019   Lumbar radiculopathy 11/15/2019    PSH: Past Surgical History:  Procedure Laterality Date   BONE MARROW BIOPSY     2007 and 1992   CATARACT EXTRACTION W/PHACO Right 08/15/2020   Procedure: CATARACT EXTRACTION PHACO AND INTRAOCULAR LENS PLACEMENT RIGHT EYE;  Surgeon: Baruch Goldmann, MD;  [facility];  Service: Ophthalmology;  Laterality: Right;  right CDE=7.75   COLONOSCOPY  04/19/2006   with polyps   COLONOSCOPY  09/30/2011   Procedure: COLONOSCOPY;  Surgeon: Rogene Houston, MD;  [facility];  Service: Endoscopy;  Laterality: N/A;  730   COLONOSCOPY N/A 01/11/2019   Procedure: COLONOSCOPY;  Surgeon: Rogene Houston, MD;  [facility];  Service: Endoscopy;  Laterality: N/A;  100-office move to 9:30am   CYSTOSCOPY W/ RETROGRADES Bilateral 02/23/2021   Procedure: CYSTOSCOPY WITH RETROGRADE PYELOGRAM;  Surgeon: Franchot Gallo, MD;  Location: [facility];  Service: Urology;  Laterality: Bilateral;   HX COLON POLYPS     TONSILLECTOMY     childhood   TRANSFORAMINAL LUMBAR INTERBODY FUSION W/ MIS 1 LEVEL Right 11/18/2020   Procedure: Right Lumbar Five-Sacral One Minimally invasive transforaminal lumbar interbody fusion;  Surgeon: Judith Part, MD;  Location: [place];  Service: Neurosurgery;  Laterality: Right;  TRANSURETHRAL RESECTION OF BLADDER TUMOR N/A 03/26/2021   Procedure: REPEAT TRANSURETHRAL RESECTION OF BLADDER TUMOR (TURBT)/ POST OPERATIVE INSTILLATION OF GEMCITABINE;  Surgeon: Franchot Gallo, MD;  Location: [facility];  Service: Urology;  Laterality: N/A;   TRANSURETHRAL RESECTION OF BLADDER TUMOR WITH MITOMYCIN-C N/A 02/23/2021   Procedure: TRANSURETHRAL RESECTION OF BLADDER TUMOR;  Surgeon:  Franchot Gallo, MD;  Location: [facility];  Service: Urology;  Laterality: N/A;    SH: Social History   Tobacco Use   Smoking status: Former    Types: Cigarettes    Quit date: 12/18/2005    Years since quitting: 15.4   Smokeless tobacco: Never  Vaping Use   Vaping Use: Never used  Substance Use Topics   Alcohol use: Yes    Alcohol/week: 4.0 standard drinks    Types: 4 Cans of beer per week    Comment: social   Drug use: No    ROS: Constitutional:  Negative for fever, chills, weight loss CV: Negative for chest pain, previous MI, hypertension Respiratory:  Negative for shortness of breath, wheezing, sleep apnea, frequent cough GI:  Negative for nausea, vomiting, bloody stool, GERD  PE: BP (!) 154/72    Pulse 96  GENERAL APPEARANCE:  Well appearing, well developed, well nourished, NAD HEENT:  Atraumatic, normocephalic NECK:  Supple. Trachea midline ABDOMEN:  Soft, non-tender, no masses EXTREMITIES:  Moves all extremities well, without clubbing, cyanosis, or edema NEUROLOGIC:  Alert and oriented x 3, normal gait, CN II-XII grossly intact MENTAL STATUS:  appropriate BACK:  Non-tender to palpation, No CVAT SKIN:  Warm, dry, and intact   Results: Laboratory Data: Lab Results  Component Value Date   WBC 6.9 11/14/2020   HGB 13.6 11/14/2020   HCT 42.1 11/14/2020   MCV 94.2 11/14/2020   PLT 168 11/14/2020    Lab Results  Component Value Date   CREATININE 1.10 07/23/2020    No results found for: PSA  No results found for: TESTOSTERONE  No results found for: HGBA1C  Urinalysis    Component Value Date/Time   APPEARANCEUR Clear 05/06/2021 0903   GLUCOSEU Negative 05/06/2021 0903   BILIRUBINUR Negative 05/06/2021 0903   PROTEINUR Negative 05/06/2021 0903   NITRITE Negative 05/06/2021 0903   LEUKOCYTESUR Trace (A) 05/06/2021 0903    Lab Results  Component Value Date   LABMICR Comment 05/06/2021   WBCUA 11-30 (A) 04/21/2021   LABEPIT >10  (A) 04/21/2021   MUCUS Present 04/21/2021   BACTERIA Few 04/21/2021    Pertinent Imaging: No imaging today

## 2021-05-18 NOTE — Patient Instructions (Signed)

## 2021-05-18 NOTE — Progress Notes (Signed)
BCG Bladder Instillation  BCG # 5 of 6  Due to Bladder Cancer patient is present today for a BCG treatment. Patient was cleaned and prepped in a sterile fashion with betadine. A 14FR catheter was inserted, urine return was noted 183ml, urine was yellow in color.  14ml of reconstituted BCG was instilled into the bladder. The catheter was then removed. Patient tolerated well, no complications were noted  Performed by: Crew Goren LPN  Follow up/ Additional notes: Keep next scheduled NV

## 2021-05-20 ENCOUNTER — Ambulatory Visit: Payer: Medicare PPO

## 2021-05-22 LAB — URINALYSIS, ROUTINE W REFLEX MICROSCOPIC
Bilirubin, UA: NEGATIVE
Glucose, UA: NEGATIVE
Ketones, UA: NEGATIVE
Nitrite, UA: NEGATIVE
Protein,UA: NEGATIVE
Specific Gravity, UA: 1.01 (ref 1.005–1.030)
Urobilinogen, Ur: 0.2 mg/dL (ref 0.2–1.0)
pH, UA: 6 (ref 5.0–7.5)

## 2021-05-22 LAB — MICROSCOPIC EXAMINATION
Epithelial Cells (non renal): NONE SEEN /hpf (ref 0–10)
Renal Epithel, UA: NONE SEEN /hpf

## 2021-05-26 DIAGNOSIS — R945 Abnormal results of liver function studies: Secondary | ICD-10-CM | POA: Diagnosis not present

## 2021-05-27 ENCOUNTER — Ambulatory Visit (INDEPENDENT_AMBULATORY_CARE_PROVIDER_SITE_OTHER): Payer: Medicare PPO

## 2021-05-27 ENCOUNTER — Other Ambulatory Visit: Payer: Self-pay

## 2021-05-27 DIAGNOSIS — C678 Malignant neoplasm of overlapping sites of bladder: Secondary | ICD-10-CM

## 2021-05-27 LAB — URINALYSIS, ROUTINE W REFLEX MICROSCOPIC
Bilirubin, UA: NEGATIVE
Glucose, UA: NEGATIVE
Ketones, UA: NEGATIVE
Nitrite, UA: NEGATIVE
Specific Gravity, UA: 1.02 (ref 1.005–1.030)
Urobilinogen, Ur: 0.2 mg/dL (ref 0.2–1.0)
pH, UA: 6 (ref 5.0–7.5)

## 2021-05-27 LAB — MICROSCOPIC EXAMINATION
Bacteria, UA: NONE SEEN
Renal Epithel, UA: NONE SEEN /hpf

## 2021-05-27 MED ORDER — BCG LIVE 50 MG IS SUSR
3.2400 mL | Freq: Once | INTRAVESICAL | Status: AC
  Administered 2021-05-27: 81 mg via INTRAVESICAL

## 2021-05-27 NOTE — Progress Notes (Signed)
BCG Bladder Instillation  BCG # 6 of 6  Due to Bladder Cancer patient is present today for a BCG treatment. Patient was cleaned and prepped in a sterile fashion with betadine. A 14FR catheter was inserted, urine return was noted 76ml, urine was Yellow in color.  79ml of reconstituted BCG was instilled into the bladder. The catheter was then removed. Patient tolerated well, no complications were noted  Performed by: Charna Archer CMA  Follow up/ Additional notes: Follow up with Dr Diona Fanti in 6 weeks.  Pt left after procedure, I attempted to call him to return so he could see PA Sharee Pimple. Was unable to make contact

## 2021-06-03 DIAGNOSIS — D1801 Hemangioma of skin and subcutaneous tissue: Secondary | ICD-10-CM | POA: Diagnosis not present

## 2021-06-03 DIAGNOSIS — L578 Other skin changes due to chronic exposure to nonionizing radiation: Secondary | ICD-10-CM | POA: Diagnosis not present

## 2021-06-03 DIAGNOSIS — L720 Epidermal cyst: Secondary | ICD-10-CM | POA: Diagnosis not present

## 2021-06-03 DIAGNOSIS — D485 Neoplasm of uncertain behavior of skin: Secondary | ICD-10-CM | POA: Diagnosis not present

## 2021-06-03 DIAGNOSIS — Z23 Encounter for immunization: Secondary | ICD-10-CM | POA: Diagnosis not present

## 2021-06-03 DIAGNOSIS — C44311 Basal cell carcinoma of skin of nose: Secondary | ICD-10-CM | POA: Diagnosis not present

## 2021-06-03 DIAGNOSIS — L503 Dermatographic urticaria: Secondary | ICD-10-CM | POA: Diagnosis not present

## 2021-06-03 DIAGNOSIS — L820 Inflamed seborrheic keratosis: Secondary | ICD-10-CM | POA: Diagnosis not present

## 2021-07-06 NOTE — Progress Notes (Signed)
History of Present Illness: Here for followup of bladder cancer. ? ?11.7.2022: TURBT-  Pathology-high-grade nonmuscle invasive bladder cancer.--6.5 cm posterior wall lesion.  Muscularis present in specimen.  He did not receive gemcitabine. ? ?12.8.2022: Repeat TURBT--8 mm Lt anterior wall papillary lesion, repeat posterior bladder wall lesion resection. Both specimens revealed HG NMIBC. Gemcitabine administered. ? ?2.8.2023: Induction BCG (6 rxs) completed. ? ?3.21.2023: Here for follow-up with cystoscopy.  His urinary symptoms have improved much.  No significant dysuria, frequency or urgency.  No recent gross hematuria. ? ? ? ?Past Medical History:  ?Diagnosis Date  ? Abnormal liver function tests 05/03/2011  ? Bladder cancer ([place]) 02/2021  ? high grade nonmuscle invasive bladder cancer  ? BPH (benign prostatic hypertrophy) 05/03/2011  ? Elevated transaminase level   ? GERD (gastroesophageal reflux disease)   ? Hairy cell leukemia, in remission ([place]) 1992  ? remission since 2008  ? IBS (irritable bowel syndrome)   ? Irritable bowel syndrome   ? 2 yrs ago, states he had inflammation around anus. Biopsy: inclusive  ? Lumbar foraminal stenosis 12/31/2019  ? Lumbar radiculopathy 11/15/2019  ? ? ?Past Surgical History:  ?Procedure Laterality Date  ? BONE MARROW BIOPSY    ? 2007 and 1992  ? CATARACT EXTRACTION W/PHACO Right 08/15/2020  ? Procedure: CATARACT EXTRACTION PHACO AND INTRAOCULAR LENS PLACEMENT RIGHT EYE;  Surgeon: Baruch Goldmann, MD;  [facility];  Service: Ophthalmology;  Laterality: Right;  right ?CDE=7.75  ? COLONOSCOPY  04/19/2006  ? with polyps  ? COLONOSCOPY  09/30/2011  ? Procedure: COLONOSCOPY;  Surgeon: Rogene Houston, MD;  [facility];  Service: Endoscopy;  Laterality: N/A;  730  ? COLONOSCOPY N/A 01/11/2019  ? Procedure: COLONOSCOPY;  Surgeon: Rogene Houston, MD;  [facility];  Service: Endoscopy;  Laterality: N/A;  100-office move to 9:30am  ? CYSTOSCOPY W/  RETROGRADES Bilateral 02/23/2021  ? Procedure: CYSTOSCOPY WITH RETROGRADE PYELOGRAM;  Surgeon: Franchot Gallo, MD;  Location: [facility];  Service: Urology;  Laterality: Bilateral;  ? HX COLON POLYPS    ? TONSILLECTOMY    ? childhood  ? TRANSFORAMINAL LUMBAR INTERBODY FUSION W/ MIS 1 LEVEL Right 11/18/2020  ? Procedure: Right Lumbar Five-Sacral One Minimally invasive transforaminal lumbar interbody fusion;  Surgeon: Judith Part, MD;  Location: [facility];  Service: Neurosurgery;  Laterality: Right;  ? TRANSURETHRAL RESECTION OF BLADDER TUMOR N/A 03/26/2021  ? Procedure: REPEAT TRANSURETHRAL RESECTION OF BLADDER TUMOR (TURBT)/ POST OPERATIVE INSTILLATION OF GEMCITABINE;  Surgeon: Franchot Gallo, MD;  Location: [facility];  Service: Urology;  Laterality: N/A;  ? TRANSURETHRAL RESECTION OF BLADDER TUMOR WITH MITOMYCIN-C N/A 02/23/2021  ? Procedure: TRANSURETHRAL RESECTION OF BLADDER TUMOR;  Surgeon: Franchot Gallo, MD;  Location: [facility];  Service: Urology;  Laterality: N/A;  ? ? ?Home Medications:  ?Allergies as of 07/07/2021   ? ?   Reactions  ? Penicillins Swelling  ? Did it involve swelling of the face/tongue/throat, SOB, or low BP? No ?Did it involve sudden or severe rash/hives, skin peeling, or any reaction on the inside of your mouth or nose? No ?Did you need to seek medical attention at a hospital or doctor's office? Was already inpatient at the time ?When did it last happen?      2005 ?If all above answers are ?NO?, may proceed with cephalosporin use.  ? ?  ? ?  ?Medication List  ?  ? ?  ? Accurate as of July 06, 2021 10:45 AM. If you have any questions, ask your nurse or doctor.  ?  ?  ? ?  ? ?CHARCOAL ACTIVATED PO ?Take 520 mg by mouth daily. Over week ?  ?Clear Eyes Seasonal Relief 0.012-0.2-0.25 % Soln ?Generic drug: Naphazoline-Glycerin-Zinc Sulf ?Place 1 drop into both eyes daily as needed (allergies). ?  ?diphenhydrAMINE 25 mg  capsule ?Commonly known as: BENADRYL ?Take 25-50 mg by mouth every 6 (six) hours as needed for allergies or sleep. ?  ?finasteride 5 MG tablet ?Commonly known as: PROSCAR ?TAKE ONE TABLET BY MOUTH EVERY MORNING ?  ?FISH OIL OMEGA-3 PO ?Take 1,000 mg by mouth daily. 300 mg omega 3. Stopped on 03/13/21 for 03/26/21 surgery. ?  ?GINKOBA PO ?Take 120 mg by mouth daily. Stopped will restart after surgery ?  ?ibuprofen 200 MG tablet ?Commonly known as: ADVIL ?Take 400 mg by mouth every 6 (six) hours as needed (Inflammation). ?  ?phenazopyridine 200 MG tablet ?Commonly known as: PYRIDIUM ?Take 200 mg by mouth 3 (three) times daily as needed. ?  ?psyllium 58.6 % packet ?Commonly known as: METAMUCIL ?Take 1 packet by mouth as needed (constipation). ?  ?rosuvastatin 10 MG tablet ?Commonly known as: CRESTOR ?Take 10 mg by mouth at bedtime. ?  ?tamsulosin 0.4 MG Caps capsule ?Commonly known as: FLOMAX ?TAKE ONE CAPSULE BY MOUTH EVERY NIGHT AT BEDTIME ?  ? ?  ? ? ?Allergies:  ?Allergies  ?Allergen Reactions  ? Penicillins Swelling  ?  Did it involve swelling of the face/tongue/throat, SOB, or low BP? No ?Did it involve sudden or severe rash/hives, skin peeling, or any reaction on the inside of your mouth or nose? No ?Did you need to seek medical attention at a hospital or doctor's office? Was already inpatient at the time ?When did it last happen?      2005 ?If all above answers are ?NO?, may proceed with cephalosporin use. ?  ? ? ?No family history on file. ? ?Social History:  reports that he quit smoking about 15 years ago. His smoking use included cigarettes. He has never used smokeless tobacco. He reports current alcohol use of about 4.0 standard drinks per week. He reports that he does not use drugs. ? ?ROS: ?A complete review of systems was performed.  All systems are negative except for pertinent findings as noted. ? ?Physical Exam:  ?Vital signs in last 24 hours: ?There were no vitals taken for this  visit. ?Constitutional:  Alert and oriented, No acute distress ?Cardiovascular: Regular rate  ?Respiratory: Normal respiratory effort ?GI: Abdomen is soft, nontender, nondistended, no abdominal masses. No CVAT.  ?Genitourinary: Normal male phallus, testes are descended bilaterally and non-tender and without masses, scrotum is normal in appearance without lesions or masses, perineum is normal on inspection. ?Lymphatic: No lymphadenopathy ?Neurologic: Grossly intact, no focal deficits ?Psychiatric: Normal mood and affect ? ?I have reviewed prior pt notes ? ?I have reviewed notes from referring/previous physicians ? ?I have reviewed urinalysis results ? ?I have independently reviewed prior imaging ? ?I have reviewed prior PSA results ? ?I have reviewed prior urine culture ? ?Cystoscopy Procedure Note: ? ?Indication: Follow-up of bladder cancer, treated ? ?After informed consent and discussion of the procedure and its risks, GENEVA PALLAS was positioned and prepped in the standard fashion.  ?Cystoscopy was performed with a flexible cystoscope.   ?Findings: ?Urethra: No lesions/stricture ?Prostate: No significant obstruction ?Bladder neck: No contracture ?Ureteral orifices: Left normal, right not seen ?Bladder: Multiple areas of prior  resection appear to be healing.  I do not see any active papillary lesions.  Fibrinous material overlying the right trigonal/ureteral orifice area. ? ?Washings taken for cytology ? ?The patient tolerated the procedure well. ? ? ? ?Impression/Assessment:  ?High-grade nonmuscle invasive bladder cancer, status post resection, repeat resection with acceptable looking cystoscopy today ? ?Plan:  ?1.  Bladder washings sent for cytology ? ?2.  He will come back in 1 month for 3 weekly BCG maintenance treatments ? ?3.  Office visit for cystoscopy with me in 3 months ? ? ?

## 2021-07-07 ENCOUNTER — Ambulatory Visit: Payer: Medicare PPO | Admitting: Urology

## 2021-07-07 ENCOUNTER — Other Ambulatory Visit: Payer: Self-pay

## 2021-07-07 ENCOUNTER — Encounter: Payer: Self-pay | Admitting: Urology

## 2021-07-07 VITALS — BP 132/57 | HR 109

## 2021-07-07 DIAGNOSIS — R8289 Other abnormal findings on cytological and histological examination of urine: Secondary | ICD-10-CM | POA: Diagnosis not present

## 2021-07-07 DIAGNOSIS — C678 Malignant neoplasm of overlapping sites of bladder: Secondary | ICD-10-CM

## 2021-07-07 LAB — URINALYSIS, ROUTINE W REFLEX MICROSCOPIC
Bilirubin, UA: NEGATIVE
Glucose, UA: NEGATIVE
Ketones, UA: NEGATIVE
Nitrite, UA: NEGATIVE
Protein,UA: NEGATIVE
Specific Gravity, UA: 1.025 (ref 1.005–1.030)
Urobilinogen, Ur: 0.2 mg/dL (ref 0.2–1.0)
pH, UA: 6 (ref 5.0–7.5)

## 2021-07-07 LAB — MICROSCOPIC EXAMINATION: Renal Epithel, UA: NONE SEEN /hpf

## 2021-07-07 MED ORDER — CIPROFLOXACIN HCL 500 MG PO TABS
500.0000 mg | ORAL_TABLET | Freq: Once | ORAL | Status: AC
  Administered 2021-07-07: 500 mg via ORAL

## 2021-07-08 DIAGNOSIS — C4491 Basal cell carcinoma of skin, unspecified: Secondary | ICD-10-CM | POA: Diagnosis not present

## 2021-07-08 DIAGNOSIS — C44311 Basal cell carcinoma of skin of nose: Secondary | ICD-10-CM | POA: Diagnosis not present

## 2021-07-08 DIAGNOSIS — Z481 Encounter for planned postprocedural wound closure: Secondary | ICD-10-CM | POA: Diagnosis not present

## 2021-07-09 LAB — CYTOLOGY - NON PAP

## 2021-07-10 ENCOUNTER — Other Ambulatory Visit: Payer: Self-pay | Admitting: Urology

## 2021-07-10 ENCOUNTER — Ambulatory Visit: Payer: Medicare PPO

## 2021-07-14 ENCOUNTER — Other Ambulatory Visit: Payer: Self-pay

## 2021-07-14 ENCOUNTER — Other Ambulatory Visit: Payer: Medicare PPO

## 2021-07-14 DIAGNOSIS — N138 Other obstructive and reflux uropathy: Secondary | ICD-10-CM

## 2021-07-14 DIAGNOSIS — N401 Enlarged prostate with lower urinary tract symptoms: Secondary | ICD-10-CM | POA: Diagnosis not present

## 2021-07-15 LAB — URINALYSIS, ROUTINE W REFLEX MICROSCOPIC
Bilirubin, UA: NEGATIVE
Glucose, UA: NEGATIVE
Ketones, UA: NEGATIVE
Nitrite, UA: NEGATIVE
Protein,UA: NEGATIVE
Specific Gravity, UA: 1.015 (ref 1.005–1.030)
Urobilinogen, Ur: 0.2 mg/dL (ref 0.2–1.0)
pH, UA: 6 (ref 5.0–7.5)

## 2021-07-15 LAB — MICROSCOPIC EXAMINATION: Bacteria, UA: NONE SEEN

## 2021-07-16 LAB — URINE CULTURE: Organism ID, Bacteria: NO GROWTH

## 2021-07-22 ENCOUNTER — Other Ambulatory Visit: Payer: Self-pay | Admitting: *Deleted

## 2021-07-22 DIAGNOSIS — C9141 Hairy cell leukemia, in remission: Secondary | ICD-10-CM

## 2021-07-23 ENCOUNTER — Inpatient Hospital Stay: Payer: Medicare PPO | Admitting: Hematology

## 2021-07-23 ENCOUNTER — Inpatient Hospital Stay: Payer: Medicare PPO | Attending: Hematology

## 2021-07-23 ENCOUNTER — Other Ambulatory Visit: Payer: Self-pay

## 2021-07-23 VITALS — BP 121/72 | HR 68 | Temp 98.1°F | Resp 20 | Wt 174.4 lb

## 2021-07-23 DIAGNOSIS — K589 Irritable bowel syndrome without diarrhea: Secondary | ICD-10-CM | POA: Insufficient documentation

## 2021-07-23 DIAGNOSIS — Z85828 Personal history of other malignant neoplasm of skin: Secondary | ICD-10-CM | POA: Insufficient documentation

## 2021-07-23 DIAGNOSIS — C679 Malignant neoplasm of bladder, unspecified: Secondary | ICD-10-CM | POA: Insufficient documentation

## 2021-07-23 DIAGNOSIS — C9141 Hairy cell leukemia, in remission: Secondary | ICD-10-CM | POA: Insufficient documentation

## 2021-07-23 DIAGNOSIS — Z87891 Personal history of nicotine dependence: Secondary | ICD-10-CM | POA: Diagnosis not present

## 2021-07-23 DIAGNOSIS — Z79899 Other long term (current) drug therapy: Secondary | ICD-10-CM | POA: Diagnosis not present

## 2021-07-23 DIAGNOSIS — Z7982 Long term (current) use of aspirin: Secondary | ICD-10-CM | POA: Diagnosis not present

## 2021-07-23 LAB — CMP (CANCER CENTER ONLY)
ALT: 28 U/L (ref 0–44)
AST: 24 U/L (ref 15–41)
Albumin: 4.1 g/dL (ref 3.5–5.0)
Alkaline Phosphatase: 91 U/L (ref 38–126)
Anion gap: 4 — ABNORMAL LOW (ref 5–15)
BUN: 14 mg/dL (ref 8–23)
CO2: 30 mmol/L (ref 22–32)
Calcium: 9 mg/dL (ref 8.9–10.3)
Chloride: 107 mmol/L (ref 98–111)
Creatinine: 1.04 mg/dL (ref 0.61–1.24)
GFR, Estimated: >60 mL/min (ref >60)
Glucose, Bld: 96 mg/dL (ref 70–99)
Potassium: 3.9 mmol/L (ref 3.5–5.1)
Sodium: 141 mmol/L (ref 135–145)
Total Bilirubin: 0.7 mg/dL (ref 0.3–1.2)
Total Protein: 6.7 g/dL (ref 6.5–8.1)

## 2021-07-23 LAB — CBC WITH DIFFERENTIAL (CANCER CENTER ONLY)
Abs Immature Granulocytes: 0.01 10*3/uL (ref 0.00–0.07)
Basophils Absolute: 0 10*3/uL (ref 0.0–0.1)
Basophils Relative: 0 %
Eosinophils Absolute: 0 10*3/uL (ref 0.0–0.5)
Eosinophils Relative: 1 %
HCT: 39 % (ref 39.0–52.0)
Hemoglobin: 13.3 g/dL (ref 13.0–17.0)
Immature Granulocytes: 0 %
Lymphocytes Relative: 30 %
Lymphs Abs: 1.7 10*3/uL (ref 0.7–4.0)
MCH: 31.6 pg (ref 26.0–34.0)
MCHC: 34.1 g/dL (ref 30.0–36.0)
MCV: 92.6 fL (ref 80.0–100.0)
Monocytes Absolute: 0.4 10*3/uL (ref 0.1–1.0)
Monocytes Relative: 7 %
Neutro Abs: 3.5 10*3/uL (ref 1.7–7.7)
Neutrophils Relative %: 62 %
Platelet Count: 164 10*3/uL (ref 150–400)
RBC: 4.21 MIL/uL — ABNORMAL LOW (ref 4.22–5.81)
RDW: 13.5 % (ref 11.5–15.5)
WBC Count: 5.7 10*3/uL (ref 4.0–10.5)
nRBC: 0 % (ref 0.0–0.2)

## 2021-07-23 LAB — LACTATE DEHYDROGENASE: LDH: 169 U/L (ref 98–192)

## 2021-07-23 NOTE — Progress Notes (Signed)
? ? ?HEMATOLOGY/ONCOLOGY CLINIC NOTE ? ?Date of Service: 07/23/2021 ? ?Patient Care Team: ?[name], MD as PCP - General (Internal Medicine) ? ?CHIEF COMPLAINTS/PURPOSE OF CONSULTATION:  ?continued evaluation and management of his Hairy Cell Leukemia. ? ?Oncologic History:  ? ?Cameron Wu was initially diagnosed with Hairy Cell Leukemia in January 1992, at which time he presented with constitutional symptoms and massive splenomegaly. He was treated with Cladribine by special exception protocol from the [facility] before the drug was officially FDA approved. He had progression in July 1995, and ws treated again with Cladribine. The pt then progressed again in June 2007, and was treated with Cladribine and Rituxan. He has remained in remission since that time. ? ?HISTORY OF PRESENTING ILLNESS:  ? ?Cameron Wu is a wonderful 71 y.o. male who has been referred to [place] by Dr. Murriel Hopper for evaluation and management of his Hairy Cell Leukemia. The pt reports that he is doing well overall. ? ?The pt reports that he has not developed any new concerns since his last visit with Dr. Beryle Beams a month ago. He denies abdominal pains or worsened energy levels, fevers, chills, night sweats, or unexpected weight loss. The pt notes that he has some chronic right shoulder and knee pain, residual from a fall about a year ago. He denies concerns for recent infections. He has followed with his PCP for is flu and pneumonia vaccinations. ? ?The pt notes that each time he received Cladribine, he received a dose each day for 5 consecutive days in an outpatient setting. The pt denies any major side effects from the chemotherapy, but did have cytopenias.  ? ?The pt notes that at his last relapse in 2007, he did not have significant symptoms, but was seen to develop abnormal blood counts, and was confirmed with a BM Bx. ? ?He denies swallowing problems, heart issues, lung issues, or liver problems. He notes  that he has a "nervous stomach which growls for hours after eating." He sees Dr. Diona Fanti in Urology for his BPH and takes Finasteride. The pt notes social alcohol use and denies ever being drunk. ? ?Most recent lab results (07/03/18) of CBC w/diff is as follows: all values are WNL. ? ?On review of systems, pt reports good energy levels, eating well, stable weight, and denies fevers, chills, night sweats, unexpected weight loss, swallowing difficulty, breathing difficulty, leg swelling, skin rashes or lesions, concern for infections, mouth sores, pain along the spine, lower abdominal pains, leg swelling, noticing any new lumps or bumps, and any other symptoms.  ? ?On PMHx the pt reports Hairy cell leukemia, environmental allergies ? ?INTERVAL HISTORY:  ?Cameron Wu is a wonderful 71 y.o. male who is here for continued evaluation and management of his Hairy Cell Leukemia. The patient's last visit with [place] was on 07/23/2020. He reports He is doing well with no new symptoms or concerns. ? ?He recently had a basal cell carcinoma removed. ? ?No fever, chills, night sweats. ?No new lumps, bumps, or lesions/rashes. ?No abdominal pain or change in bowel habits. ?No new or unexpected weight loss. ?No other new or acute focal symptoms. ? ?We discussed labs done today, 07/23/2021; CBC unremarkable. Mild anemia with RBC count of 4.21. ?CMP unremarkable ?LDH 169 ? ?MEDICAL HISTORY:  ?Past Medical History:  ?Diagnosis Date  ? Abnormal liver function tests 05/03/2011  ? Bladder cancer ([place]) 02/2021  ? high grade nonmuscle invasive bladder cancer  ? BPH (benign prostatic hypertrophy) 05/03/2011  ? Elevated  transaminase level   ? GERD (gastroesophageal reflux disease)   ? Hairy cell leukemia, in remission ([place]) 1992  ? remission since 2008  ? IBS (irritable bowel syndrome)   ? Irritable bowel syndrome   ? 2 yrs ago, states he had inflammation around anus. Biopsy: inclusive  ? Lumbar foraminal stenosis 12/31/2019  ? Lumbar  radiculopathy 11/15/2019  ? ? ?SURGICAL HISTORY: ?Past Surgical History:  ?Procedure Laterality Date  ? BONE MARROW BIOPSY    ? 2007 and 1992  ? CATARACT EXTRACTION W/PHACO Right 08/15/2020  ? Procedure: CATARACT EXTRACTION PHACO AND INTRAOCULAR LENS PLACEMENT RIGHT EYE;  Surgeon: Baruch Goldmann, MD;  [facility];  Service: Ophthalmology;  Laterality: Right;  right ?CDE=7.75  ? COLONOSCOPY  04/19/2006  ? with polyps  ? COLONOSCOPY  09/30/2011  ? Procedure: COLONOSCOPY;  Surgeon: Rogene Houston, MD;  [facility];  Service: Endoscopy;  Laterality: N/A;  730  ? COLONOSCOPY N/A 01/11/2019  ? Procedure: COLONOSCOPY;  Surgeon: Rogene Houston, MD;  [facility];  Service: Endoscopy;  Laterality: N/A;  100-office move to 9:30am  ? CYSTOSCOPY W/ RETROGRADES Bilateral 02/23/2021  ? Procedure: CYSTOSCOPY WITH RETROGRADE PYELOGRAM;  Surgeon: Franchot Gallo, MD;  Location: [facility];  Service: Urology;  Laterality: Bilateral;  ? HX COLON POLYPS    ? TONSILLECTOMY    ? childhood  ? TRANSFORAMINAL LUMBAR INTERBODY FUSION W/ MIS 1 LEVEL Right 11/18/2020  ? Procedure: Right Lumbar Five-Sacral One Minimally invasive transforaminal lumbar interbody fusion;  Surgeon: Judith Part, MD;  Location: [facility];  Service: Neurosurgery;  Laterality: Right;  ? TRANSURETHRAL RESECTION OF BLADDER TUMOR N/A 03/26/2021  ? Procedure: REPEAT TRANSURETHRAL RESECTION OF BLADDER TUMOR (TURBT)/ POST OPERATIVE INSTILLATION OF GEMCITABINE;  Surgeon: Franchot Gallo, MD;  Location: [facility];  Service: Urology;  Laterality: N/A;  ? TRANSURETHRAL RESECTION OF BLADDER TUMOR WITH MITOMYCIN-C N/A 02/23/2021  ? Procedure: TRANSURETHRAL RESECTION OF BLADDER TUMOR;  Surgeon: Franchot Gallo, MD;  Location: [facility];  Service: Urology;  Laterality: N/A;  ? ? ?SOCIAL HISTORY: ?Social History  ? ?Socioeconomic History  ? Marital status: Married  ?  Spouse name: Not on file   ? Number of children: Not on file  ? Years of education: Not on file  ? Highest education level: Not on file  ?Occupational History  ? Not on file  ?Tobacco Use  ? Smoking status: Former  ?  Types: Cigarettes  ?  Quit date: 12/18/2005  ?  Years since quitting: 15.6  ? Smokeless tobacco: Never  ?Vaping Use  ? Vaping Use: Never used  ?Substance and Sexual Activity  ? Alcohol use: Yes  ?  Alcohol/week: 4.0 standard drinks  ?  Types: 4 Cans of beer per week  ?  Comment: social  ? Drug use: No  ? Sexual activity: Not on file  ?Other Topics Concern  ? Not on file  ?Social History Narrative  ? Not on file  ? ?Social Determinants of Health  ? ?Financial Resource Strain: Not on file  ?Food Insecurity: Not on file  ?Transportation Needs: Not on file  ?Physical Activity: Not on file  ?Stress: Not on file  ?Social Connections: Not on file  ?Intimate Partner Violence: Not on file  ? ? ?FAMILY HISTORY: ?No family history on file. ? ?ALLERGIES:  is allergic to penicillins. ? ?MEDICATIONS:  ?Current Outpatient Medications  ?Medication Sig Dispense Refill  ? CHARCOAL ACTIVATED PO Take 520 mg  by mouth daily. Over week    ? diphenhydrAMINE (BENADRYL) 25 mg capsule Take 25-50 mg by mouth every 6 (six) hours as needed for allergies or sleep.    ? finasteride (PROSCAR) 5 MG tablet TAKE ONE TABLET BY MOUTH EVERY MORNING 90 tablet 3  ? Ginkgo Biloba (GINKOBA PO) Take 120 mg by mouth daily. Stopped will restart after surgery    ? ibuprofen (ADVIL,MOTRIN) 200 MG tablet Take 400 mg by mouth every 6 (six) hours as needed (Inflammation).    ? Naphazoline-Glycerin-Zinc Sulf (CLEAR EYES SEASONAL RELIEF) 0.012-0.2-0.25 % SOLN Place 1 drop into both eyes daily as needed (allergies).    ? Omega-3 Fatty Acids (FISH OIL OMEGA-3 PO) Take 1,000 mg by mouth daily. 300 mg omega 3. Stopped on 03/13/21 for 03/26/21 surgery.    ? psyllium (METAMUCIL) 58.6 % packet Take 1 packet by mouth as needed (constipation).    ? rosuvastatin (CRESTOR) 10 MG tablet Take 10  mg by mouth at bedtime.    ? tamsulosin (FLOMAX) 0.4 MG CAPS capsule TAKE ONE CAPSULE BY MOUTH EVERY NIGHT AT BEDTIME 90 capsule 3  ? ?No current facility-administered medications for this visit.  ? ?Facil

## 2021-07-30 DIAGNOSIS — H01001 Unspecified blepharitis right upper eyelid: Secondary | ICD-10-CM | POA: Diagnosis not present

## 2021-07-30 DIAGNOSIS — H43811 Vitreous degeneration, right eye: Secondary | ICD-10-CM | POA: Diagnosis not present

## 2021-07-30 DIAGNOSIS — H01002 Unspecified blepharitis right lower eyelid: Secondary | ICD-10-CM | POA: Diagnosis not present

## 2021-07-30 DIAGNOSIS — H401131 Primary open-angle glaucoma, bilateral, mild stage: Secondary | ICD-10-CM | POA: Diagnosis not present

## 2021-08-10 ENCOUNTER — Ambulatory Visit (INDEPENDENT_AMBULATORY_CARE_PROVIDER_SITE_OTHER): Payer: Medicare PPO | Admitting: Physician Assistant

## 2021-08-10 VITALS — BP 156/77 | HR 77 | Ht 72.0 in | Wt 177.0 lb

## 2021-08-10 DIAGNOSIS — C678 Malignant neoplasm of overlapping sites of bladder: Secondary | ICD-10-CM

## 2021-08-10 LAB — MICROSCOPIC EXAMINATION
Renal Epithel, UA: NONE SEEN /hpf
WBC, UA: NONE SEEN /hpf (ref 0–5)

## 2021-08-10 LAB — URINALYSIS, ROUTINE W REFLEX MICROSCOPIC
Bilirubin, UA: NEGATIVE
Glucose, UA: NEGATIVE
Ketones, UA: NEGATIVE
Leukocytes,UA: NEGATIVE
Nitrite, UA: NEGATIVE
Protein,UA: NEGATIVE
Specific Gravity, UA: 1.015 (ref 1.005–1.030)
Urobilinogen, Ur: 0.2 mg/dL (ref 0.2–1.0)
pH, UA: 5.5 (ref 5.0–7.5)

## 2021-08-10 MED ORDER — BCG LIVE 50 MG IS SUSR
3.2400 mL | Freq: Once | INTRAVESICAL | Status: AC
  Administered 2021-08-10: 81 mg via INTRAVESICAL

## 2021-08-10 NOTE — Progress Notes (Signed)
BCG Bladder Instillation ? ?BCG # 1 of 3 ? ?Due to Bladder Cancer patient is present today for a BCG treatment. Patient was cleaned and prepped in a sterile fashion with betadine. A 14FR catheter was inserted, urine return was noted 87m, urine was yellow in color.  532mof reconstituted BCG was instilled into the bladder. The catheter was then removed. Patient tolerated well, no complications were noted ? ?Performed by: KoLevi AlandCMA ? ?Follow up/ Additional notes: Follow up as scheduled.  ?  ?  ?

## 2021-08-10 NOTE — Patient Instructions (Signed)

## 2021-08-18 DIAGNOSIS — L905 Scar conditions and fibrosis of skin: Secondary | ICD-10-CM | POA: Diagnosis not present

## 2021-08-18 DIAGNOSIS — C44319 Basal cell carcinoma of skin of other parts of face: Secondary | ICD-10-CM | POA: Diagnosis not present

## 2021-08-18 DIAGNOSIS — Z481 Encounter for planned postprocedural wound closure: Secondary | ICD-10-CM | POA: Diagnosis not present

## 2021-08-18 DIAGNOSIS — Z789 Other specified health status: Secondary | ICD-10-CM | POA: Diagnosis not present

## 2021-08-18 DIAGNOSIS — R208 Other disturbances of skin sensation: Secondary | ICD-10-CM | POA: Diagnosis not present

## 2021-08-20 ENCOUNTER — Ambulatory Visit: Payer: Medicare PPO | Admitting: Physician Assistant

## 2021-08-20 VITALS — BP 146/74 | HR 67 | Ht 72.0 in | Wt 176.0 lb

## 2021-08-20 DIAGNOSIS — C678 Malignant neoplasm of overlapping sites of bladder: Secondary | ICD-10-CM | POA: Diagnosis not present

## 2021-08-20 LAB — MICROSCOPIC EXAMINATION: Renal Epithel, UA: NONE SEEN /hpf

## 2021-08-20 LAB — URINALYSIS, ROUTINE W REFLEX MICROSCOPIC
Bilirubin, UA: NEGATIVE
Glucose, UA: NEGATIVE
Ketones, UA: NEGATIVE
Nitrite, UA: NEGATIVE
Protein,UA: NEGATIVE
Specific Gravity, UA: 1.015 (ref 1.005–1.030)
Urobilinogen, Ur: 0.2 mg/dL (ref 0.2–1.0)
pH, UA: 6.5 (ref 5.0–7.5)

## 2021-08-20 MED ORDER — PHENAZOPYRIDINE HCL 200 MG PO TABS: 200.0000 mg | ORAL_TABLET | Freq: Three times a day (TID) | ORAL | 1 refills | Status: DC | PRN

## 2021-08-20 MED ORDER — BCG LIVE 50 MG IS SUSR
3.2400 mL | Freq: Once | INTRAVESICAL | Status: AC
  Administered 2021-08-20: 81 mg via INTRAVESICAL

## 2021-08-20 NOTE — Patient Instructions (Signed)

## 2021-08-20 NOTE — Progress Notes (Signed)
BCG Bladder Instillation ? ?BCG # 2 of 3 ? ?Due to Bladder Cancer patient is present today for a BCG treatment. Patient was cleaned and prepped in a sterile fashion with betadine. A 14FR catheter was inserted, urine return was noted 23m, urine was yellow in color.  532mof reconstituted BCG was instilled into the bladder. The catheter was then removed. Patient tolerated well, no complications were noted ? ?Performed by: KoLevi AlandCMA ? ?Follow up/ Additional notes: Follow up as scheduled.  ?  ?

## 2021-08-24 NOTE — Progress Notes (Signed)
? ?Assessment: ?1. Malignant neoplasm of overlapping sites of bladder (HCC) ?- Urinalysis, Routine w reflex microscopic ?- bcg vaccine injection 81 mg ?  ? ?Plan: ?Pyridium prescribed to assist with symptoms of burning while as he is having treatments.  FU next week for final maintenance dose of BCG  ? ?Chief Complaint: ?No chief complaint on file. ? ? ?HPI: ?Cameron Wu is a 71 y.o. male who presents for continued evaluation of malignant neoplasm of the bladder.  He is scheduled for BCG treatment 2 of 3 for maintenance.  He tolerated last week's treatment well except for significant urinary burning since.  He recalls having similar symptoms at his initial BCG treatment cycle.  He reports no abdominal pain or gross hematuria.  No increase in frequency and no urgency. ? ?UA = 6-10 WBCs, 0-2 RBCs, few bacteria, nitrite negative ?Portions of the above documentation were copied from a prior visit for review purposes only. ? ?Allergies: ?Allergies  ?Allergen Reactions  ? Penicillins Swelling  ?  Did it involve swelling of the face/tongue/throat, SOB, or low BP? No ?Did it involve sudden or severe rash/hives, skin peeling, or any reaction on the inside of your mouth or nose? No ?Did you need to seek medical attention at a hospital or doctor's office? Was already inpatient at the time ?When did it last happen?      2005 ?If all above answers are ?NO?, may proceed with cephalosporin use. ?  ? ? ?PMH: ?Past Medical History:  ?Diagnosis Date  ? Abnormal liver function tests 05/03/2011  ? Bladder cancer ([place]) 02/2021  ? high grade nonmuscle invasive bladder cancer  ? BPH (benign prostatic hypertrophy) 05/03/2011  ? Elevated transaminase level   ? GERD (gastroesophageal reflux disease)   ? Hairy cell leukemia, in remission ([place]) 1992  ? remission since 2008  ? IBS (irritable bowel syndrome)   ? Irritable bowel syndrome   ? 2 yrs ago, states he had inflammation around anus. Biopsy: inclusive  ? Lumbar foraminal stenosis  12/31/2019  ? Lumbar radiculopathy 11/15/2019  ? ? ?PSH: ?Past Surgical History:  ?Procedure Laterality Date  ? BONE MARROW BIOPSY    ? 2007 and 1992  ? CATARACT EXTRACTION W/PHACO Right 08/15/2020  ? Procedure: CATARACT EXTRACTION PHACO AND INTRAOCULAR LENS PLACEMENT RIGHT EYE;  Surgeon: Baruch Goldmann, MD;  [facility];  Service: Ophthalmology;  Laterality: Right;  right ?CDE=7.75  ? COLONOSCOPY  04/19/2006  ? with polyps  ? COLONOSCOPY  09/30/2011  ? Procedure: COLONOSCOPY;  Surgeon: Rogene Houston, MD;  [facility];  Service: Endoscopy;  Laterality: N/A;  730  ? COLONOSCOPY N/A 01/11/2019  ? Procedure: COLONOSCOPY;  Surgeon: Rogene Houston, MD;  [facility];  Service: Endoscopy;  Laterality: N/A;  100-office move to 9:30am  ? CYSTOSCOPY W/ RETROGRADES Bilateral 02/23/2021  ? Procedure: CYSTOSCOPY WITH RETROGRADE PYELOGRAM;  Surgeon: Franchot Gallo, MD;  Location: [facility];  Service: Urology;  Laterality: Bilateral;  ? HX COLON POLYPS    ? TONSILLECTOMY    ? childhood  ? TRANSFORAMINAL LUMBAR INTERBODY FUSION W/ MIS 1 LEVEL Right 11/18/2020  ? Procedure: Right Lumbar Five-Sacral One Minimally invasive transforaminal lumbar interbody fusion;  Surgeon: Judith Part, MD;  Location: [place];  Service: Neurosurgery;  Laterality: Right;  ? TRANSURETHRAL RESECTION OF BLADDER TUMOR N/A 03/26/2021  ? Procedure: REPEAT TRANSURETHRAL RESECTION OF BLADDER TUMOR (TURBT)/ POST OPERATIVE INSTILLATION OF GEMCITABINE;  Surgeon: Franchot Gallo, MD;  Location: [place]  [place];  Service: Urology;  Laterality: N/A;  ? TRANSURETHRAL RESECTION OF BLADDER TUMOR WITH MITOMYCIN-C N/A 02/23/2021  ? Procedure: TRANSURETHRAL RESECTION OF BLADDER TUMOR;  Surgeon: Franchot Gallo, MD;  Location: [facility];  Service: Urology;  Laterality: N/A;  ? ? ?SH: ?Social History  ? ?Tobacco Use  ? Smoking status: Former  ?  Types: Cigarettes  ?  Quit date: 12/18/2005   ?  Years since quitting: 15.6  ? Smokeless tobacco: Never  ?Vaping Use  ? Vaping Use: Never used  ?Substance Use Topics  ? Alcohol use: Yes  ?  Alcohol/week: 4.0 standard drinks  ?  Types: 4 Cans of beer per week  ?  Comment: social  ? Drug use: No  ? ? ?ROS: ?See HPI ? ?PE: ?BP (!) 146/74   Pulse 67   Ht 6' (1.829 m)   Wt 176 lb (79.8 kg)   BMI 23.87 kg/m?  ?GENERAL APPEARANCE:  Well appearing, well developed, well nourished, NAD ?HEENT:  Atraumatic, normocephalic ?NECK:  Supple. Trachea midline ?ABDOMEN:  Soft, non-tender, no masses ?MENTAL STATUS:  appropriate ?BACK:  Non-tender to palpation, No CVAT ?SKIN:  Warm, dry, and intact ? ? ?Results: ?Laboratory Data: ?Lab Results  ?Component Value Date  ? WBC 5.7 07/23/2021  ? HGB 13.3 07/23/2021  ? HCT 39.0 07/23/2021  ? MCV 92.6 07/23/2021  ? PLT 164 07/23/2021  ? ? ?Lab Results  ?Component Value Date  ? CREATININE 1.04 07/23/2021  ? ? ?Urinalysis ?   ?Component Value Date/Time  ? APPEARANCEUR Clear 08/20/2021 1144  ? GLUCOSEU Negative 08/20/2021 1144  ? BILIRUBINUR Negative 08/20/2021 1144  ? PROTEINUR Negative 08/20/2021 1144  ? NITRITE Negative 08/20/2021 1144  ? LEUKOCYTESUR Trace (A) 08/20/2021 1144  ? ? ?Lab Results  ?Component Value Date  ? LABMICR See below: 08/20/2021  ? WBCUA 6-10 (A) 08/20/2021  ? LABEPIT 0-10 08/20/2021  ? MUCUS Present 07/07/2021  ? BACTERIA Few (A) 08/20/2021  ? ? ?Pertinent Imaging: ? ?No results found for this or any previous visit. ? ?No results found for this or any previous visit. ? ?No results found for this or any previous visit. ? ?No results found for this or any previous visit. ? ?No results found for this or any previous visit. ? ?No results found for this or any previous visit. ? ?No results found for this or any previous visit. ? ?No results found for this or any previous visit. ? ?No results found for this or any previous visit (from the past 24 hour(s)).  ?

## 2021-08-26 ENCOUNTER — Ambulatory Visit: Payer: Medicare PPO | Admitting: Physician Assistant

## 2021-08-26 VITALS — BP 156/77 | HR 69 | Ht 72.0 in | Wt 177.0 lb

## 2021-08-26 DIAGNOSIS — R8271 Bacteriuria: Secondary | ICD-10-CM

## 2021-08-26 DIAGNOSIS — C678 Malignant neoplasm of overlapping sites of bladder: Secondary | ICD-10-CM

## 2021-08-26 DIAGNOSIS — R309 Painful micturition, unspecified: Secondary | ICD-10-CM | POA: Diagnosis not present

## 2021-08-26 LAB — URINALYSIS, ROUTINE W REFLEX MICROSCOPIC
Bilirubin, UA: NEGATIVE
Glucose, UA: NEGATIVE
Ketones, UA: NEGATIVE
Nitrite, UA: NEGATIVE
Protein,UA: NEGATIVE
Specific Gravity, UA: 1.015 (ref 1.005–1.030)
Urobilinogen, Ur: 0.2 mg/dL (ref 0.2–1.0)
pH, UA: 5.5 (ref 5.0–7.5)

## 2021-08-26 LAB — MICROSCOPIC EXAMINATION

## 2021-08-26 MED ORDER — BCG LIVE 50 MG IS SUSR: 3.2400 mL | Freq: Once | INTRAVESICAL | Status: DC

## 2021-08-26 MED ORDER — BCG LIVE 50 MG IS SUSR
3.2400 mL | Freq: Once | INTRAVESICAL | Status: AC
  Administered 2021-08-26: 81 mg via INTRAVESICAL

## 2021-08-26 MED ORDER — DOXYCYCLINE HYCLATE 100 MG PO CAPS: 100.0000 mg | ORAL_CAPSULE | Freq: Two times a day (BID) | ORAL | 1 refills | Status: DC

## 2021-08-26 NOTE — Progress Notes (Signed)
BCG Bladder Instillation ? ?BCG # 3 of 3 ? ?Due to Bladder Cancer patient is present today for a BCG treatment. Patient was cleaned and prepped in a sterile fashion with betadine. A 14FR catheter was inserted, urine return was noted 89m, urine was yellow in color.  525mof reconstituted BCG was instilled into the bladder. The catheter was then removed. Patient tolerated well, no complications were noted ? ?Performed by: KoLevi AlandCMA ? ?Follow up/ Additional notes: Follow up with MD ?  ?

## 2021-08-26 NOTE — Progress Notes (Signed)
? ?Assessment: ?1. Malignant neoplasm of overlapping sites of bladder (HCC) ?- Urinalysis, Routine w reflex microscopic ?- bcg vaccine injection 81 mg ? ?2. Bacteriuria ?- Urine Culture ? ?3. Painful urination ?  ? ?Plan: ?Final maintenance dose of BCG instilled today.  Discussed symptoms and possible causes of his symptoms at length.  Doxycycline BID for 3 weeks for persistent dysuria and suspected prostatitis. Keep FU with Dr. Diona Fanti as scheduled. Urine sent for cx.  Will consider change in therapy pending results. ? ?Chief Complaint: ?No chief complaint on file. ? ? ?HPI: ?Cameron Wu is a 71 y.o. male who presents for continued evaluation of bladder neoplasm. Pt is scheduled for maintenance dose of BCG 3/3 today.  He continues to complain of distal penile burning with urination which has been unrelieved over the past several of weeks.  Pyridium has not been helpful. no abdominal pain, gross hematuria.  He continues prescription for Flomax as well as daily finasteride. ? ?UA = 6-10 WBCs, 3-10 RBCs, few bacteria, nitrite negative ?PVR 185 mL ? ?08/20/21 ?Cameron Wu is a 71 y.o. male who presents for continued evaluation of malignant neoplasm of the bladder.  He is scheduled for BCG treatment 2 of 3 for maintenance.  He tolerated last week's treatment well except for significant urinary burning since.  He recalls having similar symptoms at his initial BCG treatment cycle.  He reports no abdominal pain or gross hematuria.  No increase in frequency and no urgency. ? ?07/07/21 ?Here for followup of bladder cancer. ?  ?11.7.2022: TURBT-  Pathology-high-grade nonmuscle invasive bladder cancer.--6.5 cm posterior wall lesion.  Muscularis present in specimen.  He did not receive gemcitabine. ?  ?12.8.2022: Repeat TURBT--8 mm Lt anterior wall papillary lesion, repeat posterior bladder wall lesion resection. Both specimens revealed HG NMIBC. Gemcitabine administered. ?  ?2.8.2023: Induction BCG (6 rxs)  completed. ?  ?3.21.2023: Here for follow-up with cystoscopy.  His urinary symptoms have improved much.  No significant dysuria, frequency or urgency.  No recent gross hematuria. ? ? ?Portions of the above documentation were copied from a prior visit for review purposes only. ? ?Allergies: ?Allergies  ?Allergen Reactions  ? Penicillins Swelling  ?  Did it involve swelling of the face/tongue/throat, SOB, or low BP? No ?Did it involve sudden or severe rash/hives, skin peeling, or any reaction on the inside of your mouth or nose? No ?Did you need to seek medical attention at a hospital or doctor's office? Was already inpatient at the time ?When did it last happen?      2005 ?If all above answers are ?NO?, may proceed with cephalosporin use. ?  ? ? ?PMH: ?Past Medical History:  ?Diagnosis Date  ? Abnormal liver function tests 05/03/2011  ? Bladder cancer ([address]) 02/2021  ? high grade nonmuscle invasive bladder cancer  ? BPH (benign prostatic hypertrophy) 05/03/2011  ? Elevated transaminase level   ? GERD (gastroesophageal reflux disease)   ? Hairy cell leukemia, in remission ([place]) 1992  ? remission since 2008  ? IBS (irritable bowel syndrome)   ? Irritable bowel syndrome   ? 2 yrs ago, states he had inflammation around anus. Biopsy: inclusive  ? Lumbar foraminal stenosis 12/31/2019  ? Lumbar radiculopathy 11/15/2019  ? ? ?PSH: ?Past Surgical History:  ?Procedure Laterality Date  ? BONE MARROW BIOPSY    ? 2007 and 1992  ? CATARACT EXTRACTION W/PHACO Right 08/15/2020  ? Procedure: CATARACT EXTRACTION PHACO AND INTRAOCULAR LENS PLACEMENT RIGHT EYE;  Surgeon: Marisa Hua,  Jeneen Rinks, MD;  Location: [facility];  Service: Ophthalmology;  Laterality: Right;  right ?CDE=7.75  ? COLONOSCOPY  04/19/2006  ? with polyps  ? COLONOSCOPY  09/30/2011  ? Procedure: COLONOSCOPY;  Surgeon: Rogene Houston, MD;  [facility];  Service: Endoscopy;  Laterality: N/A;  730  ? COLONOSCOPY N/A 01/11/2019  ? Procedure: COLONOSCOPY;  Surgeon: Rogene Houston, MD;  [facility];  Service: Endoscopy;  Laterality: N/A;  100-office move to 9:30am  ? CYSTOSCOPY W/ RETROGRADES Bilateral 02/23/2021  ? Procedure: CYSTOSCOPY WITH RETROGRADE PYELOGRAM;  Surgeon: Franchot Gallo, MD;  [facility];  Service: Urology;  Laterality: Bilateral;  ? HX COLON POLYPS    ? TONSILLECTOMY    ? childhood  ? TRANSFORAMINAL LUMBAR INTERBODY FUSION W/ MIS 1 LEVEL Right 11/18/2020  ? Procedure: Right Lumbar Five-Sacral One Minimally invasive transforaminal lumbar interbody fusion;  Surgeon: Judith Part, MD;  Location: [place];  Service: Neurosurgery;  Laterality: Right;  ? TRANSURETHRAL RESECTION OF BLADDER TUMOR N/A 03/26/2021  ? Procedure: REPEAT TRANSURETHRAL RESECTION OF BLADDER TUMOR (TURBT)/ POST OPERATIVE INSTILLATION OF GEMCITABINE;  Surgeon: Franchot Gallo, MD;  [facility]  Service: Urology;  Laterality: N/A;  ? TRANSURETHRAL RESECTION OF BLADDER TUMOR WITH MITOMYCIN-C N/A 02/23/2021  ? Procedure: TRANSURETHRAL RESECTION OF BLADDER TUMOR;  Surgeon: Franchot Gallo, MD;  Location: [facility];  Service: Urology;  Laterality: N/A;  ? ? ?SH: ?Social History  ? ?Tobacco Use  ? Smoking status: Former  ?  Types: Cigarettes  ?  Quit date: 12/18/2005  ?  Years since quitting: 15.6  ? Smokeless tobacco: Never  ?Vaping Use  ? Vaping Use: Never used  ?Substance Use Topics  ? Alcohol use: Yes  ?  Alcohol/week: 4.0 standard drinks  ?  Types: 4 Cans of beer per week  ?  Comment: social  ? Drug use: No  ? ? ?ROS: ?Constitutional:  Negative for fever, chills, weight loss ?CV: Negative for chest pain ?Respiratory:  Negative for shortness of breath, wheezing, sleep apnea, frequent cough ?GI:  Negative for nausea, vomiting, bloody stool, GERD ? ?PE: ?BP (!) 156/77   Pulse 69   Ht 6' (1.829 m)   Wt 177 lb (80.3 kg)   BMI 24.01 kg/m?  ?GENERAL APPEARANCE:  Well appearing, well developed, well nourished,  NAD ?HEENT:  Atraumatic, normocephalic ?NECK:  Supple. Trachea midline ?ABDOMEN:  Soft, non-tender, no masses. GU exam deferred as pt had already undergone BCG instillation prior to provider eval. ?EXTREMITIES:  Moves all extremities well, without clubbing, cyanosis, or edema ?NEUROLOGIC:  Alert and oriented x 3, normal gait, CN II-XII grossly intact ?MENTAL STATUS:  appropriate ?BACK:  Non-tender to palpation, No CVAT ?SKIN:  Warm, dry, and intact ? ? ?Results: ?Laboratory Data: ?Lab Results  ?Component Value Date  ? WBC 5.7 07/23/2021  ? HGB 13.3 07/23/2021  ? HCT 39.0 07/23/2021  ? MCV 92.6 07/23/2021  ? PLT 164 07/23/2021  ? ? ?Lab Results  ?Component Value Date  ? CREATININE 1.04 07/23/2021  ? ? ?Urinalysis ?   ?Component Value Date/Time  ? APPEARANCEUR Clear 08/20/2021 1144  ? GLUCOSEU Negative 08/20/2021 1144  ? BILIRUBINUR Negative 08/20/2021 1144  ? PROTEINUR Negative 08/20/2021 1144  ? NITRITE Negative 08/20/2021 1144  ? LEUKOCYTESUR Trace (A) 08/20/2021 1144  ? ? ?Lab Results  ?Component Value Date  ? LABMICR See below: 08/20/2021  ? WBCUA 6-10 (A) 08/20/2021  ? LABEPIT 0-10  08/20/2021  ? MUCUS Present 07/07/2021  ? BACTERIA Few (A) 08/20/2021  ? ? ?Pertinent Imaging: ? ?No results found for this or any previous visit. ? ?No results found for this or any previous visit. ? ?No results found for this or any previous visit. ? ?No results found for this or any previous visit. ? ?No results found for this or any previous visit. ? ?No results found for this or any previous visit. ? ?No results found for this or any previous visit. ? ?No results found for this or any previous visit. ? ?No results found for this or any previous visit (from the past 24 hour(s)).  ?

## 2021-08-26 NOTE — Patient Instructions (Signed)

## 2021-08-28 LAB — URINE CULTURE: Organism ID, Bacteria: NO GROWTH

## 2021-09-10 DIAGNOSIS — H01002 Unspecified blepharitis right lower eyelid: Secondary | ICD-10-CM | POA: Diagnosis not present

## 2021-09-10 DIAGNOSIS — H401131 Primary open-angle glaucoma, bilateral, mild stage: Secondary | ICD-10-CM | POA: Diagnosis not present

## 2021-09-10 DIAGNOSIS — H43811 Vitreous degeneration, right eye: Secondary | ICD-10-CM | POA: Diagnosis not present

## 2021-09-10 DIAGNOSIS — H01001 Unspecified blepharitis right upper eyelid: Secondary | ICD-10-CM | POA: Diagnosis not present

## 2021-10-13 ENCOUNTER — Ambulatory Visit: Payer: Medicare PPO | Admitting: Urology

## 2021-10-13 ENCOUNTER — Encounter: Payer: Self-pay | Admitting: Urology

## 2021-10-13 VITALS — BP 123/69 | HR 72

## 2021-10-13 DIAGNOSIS — R972 Elevated prostate specific antigen [PSA]: Secondary | ICD-10-CM

## 2021-10-13 DIAGNOSIS — Z8551 Personal history of malignant neoplasm of bladder: Secondary | ICD-10-CM | POA: Diagnosis not present

## 2021-10-13 DIAGNOSIS — C678 Malignant neoplasm of overlapping sites of bladder: Secondary | ICD-10-CM

## 2021-10-13 LAB — URINALYSIS, ROUTINE W REFLEX MICROSCOPIC
Bilirubin, UA: NEGATIVE
Glucose, UA: NEGATIVE
Ketones, UA: NEGATIVE
Leukocytes,UA: NEGATIVE
Nitrite, UA: NEGATIVE
Protein,UA: NEGATIVE
Specific Gravity, UA: <1.005 — ABNORMAL LOW (ref 1.005–1.030)
Urobilinogen, Ur: 0.2 mg/dL (ref 0.2–1.0)
pH, UA: 5 (ref 5.0–7.5)

## 2021-10-13 LAB — MICROSCOPIC EXAMINATION
Bacteria, UA: NONE SEEN
Epithelial Cells (non renal): NONE SEEN /hpf (ref 0–10)
Renal Epithel, UA: NONE SEEN /hpf
WBC, UA: NONE SEEN /hpf (ref 0–5)

## 2021-10-13 MED ORDER — CIPROFLOXACIN HCL 500 MG PO TABS
500.0000 mg | ORAL_TABLET | Freq: Once | ORAL | Status: AC
  Administered 2021-10-13: 500 mg via ORAL

## 2021-10-15 ENCOUNTER — Other Ambulatory Visit: Payer: Self-pay | Admitting: Urology

## 2021-10-21 ENCOUNTER — Encounter (HOSPITAL_BASED_OUTPATIENT_CLINIC_OR_DEPARTMENT_OTHER): Payer: Self-pay | Admitting: Urology

## 2021-10-21 ENCOUNTER — Other Ambulatory Visit: Payer: Self-pay

## 2021-10-21 NOTE — Progress Notes (Addendum)
Spoke w/ via phone for pre-op interview--- patient Cameron Wu---- N/A              Lab results------ COVID test -----patient states asymptomatic no test needed Arrive at ------- 0745 am NPO after MN NO Solid Food.  Clear liquids from MN until--- 0645 am Med rec completed Medications to take morning of surgery ----- Proscar, Vibramycin Diabetic medication ----- Patient instructed no nail polish to be worn day of surgery N/A Patient instructed to bring photo id and insurance card day of surgery Yes Patient aware to have Driver (ride ) / caregiver (wife Cameron Wu) for 24 hours after surgery Yes Patient Special Instructions ----- Pre-Op special Istructions ----- Patient verbalized understanding of instructions that were given at this phone interview. Yes Patient denies shortness of breath, chest pain, fever, cough at this phone interview. Yes

## 2021-10-29 ENCOUNTER — Encounter (HOSPITAL_BASED_OUTPATIENT_CLINIC_OR_DEPARTMENT_OTHER): Payer: Self-pay | Admitting: Urology

## 2021-10-29 ENCOUNTER — Ambulatory Visit (HOSPITAL_BASED_OUTPATIENT_CLINIC_OR_DEPARTMENT_OTHER)
Admission: RE | Admit: 2021-10-29 | Discharge: 2021-10-29 | Disposition: A | Discharge status: Home / Self Care | Payer: Medicare PPO | Attending: Urology | Admitting: Urology

## 2021-10-29 ENCOUNTER — Ambulatory Visit (HOSPITAL_BASED_OUTPATIENT_CLINIC_OR_DEPARTMENT_OTHER): Payer: Medicare PPO | Admitting: Anesthesiology

## 2021-10-29 ENCOUNTER — Encounter (HOSPITAL_BASED_OUTPATIENT_CLINIC_OR_DEPARTMENT_OTHER)
Admission: RE | Disposition: A | Discharge status: Home / Self Care | Payer: Self-pay | Source: Home / Self Care | Attending: Urology

## 2021-10-29 ENCOUNTER — Other Ambulatory Visit: Payer: Self-pay

## 2021-10-29 DIAGNOSIS — Z8551 Personal history of malignant neoplasm of bladder: Secondary | ICD-10-CM | POA: Insufficient documentation

## 2021-10-29 DIAGNOSIS — K219 Gastro-esophageal reflux disease without esophagitis: Secondary | ICD-10-CM | POA: Diagnosis not present

## 2021-10-29 DIAGNOSIS — D303 Benign neoplasm of bladder: Secondary | ICD-10-CM | POA: Insufficient documentation

## 2021-10-29 DIAGNOSIS — N3020 Other chronic cystitis without hematuria: Secondary | ICD-10-CM | POA: Diagnosis not present

## 2021-10-29 DIAGNOSIS — C679 Malignant neoplasm of bladder, unspecified: Secondary | ICD-10-CM

## 2021-10-29 DIAGNOSIS — Z87891 Personal history of nicotine dependence: Secondary | ICD-10-CM | POA: Insufficient documentation

## 2021-10-29 DIAGNOSIS — Z856 Personal history of leukemia: Secondary | ICD-10-CM | POA: Insufficient documentation

## 2021-10-29 DIAGNOSIS — C678 Malignant neoplasm of overlapping sites of bladder: Secondary | ICD-10-CM

## 2021-10-29 HISTORY — PX: TRANSURETHRAL RESECTION OF BLADDER TUMOR WITH MITOMYCIN-C: SHX6459

## 2021-10-29 HISTORY — DX: Unspecified osteoarthritis, unspecified site: M19.90

## 2021-10-29 SURGERY — TRANSURETHRAL RESECTION OF BLADDER TUMOR WITH MITOMYCIN-C
Anesthesia: General

## 2021-10-29 MED ORDER — GEMCITABINE CHEMO FOR BLADDER INSTILLATION 2000 MG: 2000.0000 mg | Freq: Once | INTRAVENOUS | Status: DC

## 2021-10-29 MED ORDER — FENTANYL CITRATE (PF) 100 MCG/2ML IJ SOLN
INTRAMUSCULAR | Status: AC
  Filled 2021-10-29: qty 2

## 2021-10-29 MED ORDER — ONDANSETRON HCL 4 MG/2ML IJ SOLN
INTRAMUSCULAR | Status: DC | PRN
  Administered 2021-10-29: 4 mg via INTRAVENOUS

## 2021-10-29 MED ORDER — ONDANSETRON HCL 4 MG/2ML IJ SOLN
INTRAMUSCULAR | Status: AC
  Filled 2021-10-29: qty 2

## 2021-10-29 MED ORDER — ACETAMINOPHEN 500 MG PO TABS
1000.0000 mg | ORAL_TABLET | Freq: Once | ORAL | Status: AC
  Administered 2021-10-29: 1000 mg via ORAL

## 2021-10-29 MED ORDER — PROPOFOL 10 MG/ML IV BOLUS
INTRAVENOUS | Status: DC | PRN
  Administered 2021-10-29: 200 mg via INTRAVENOUS

## 2021-10-29 MED ORDER — CIPROFLOXACIN IN D5W 400 MG/200ML IV SOLN
400.0000 mg | INTRAVENOUS | Status: AC
  Administered 2021-10-29: 400 mg via INTRAVENOUS

## 2021-10-29 MED ORDER — DEXAMETHASONE SODIUM PHOSPHATE 10 MG/ML IJ SOLN
INTRAMUSCULAR | Status: DC | PRN
  Administered 2021-10-29: 5 mg via INTRAVENOUS

## 2021-10-29 MED ORDER — PHENYLEPHRINE 80 MCG/ML (10ML) SYRINGE FOR IV PUSH (FOR BLOOD PRESSURE SUPPORT)
PREFILLED_SYRINGE | INTRAVENOUS | Status: AC
  Filled 2021-10-29: qty 10

## 2021-10-29 MED ORDER — LIDOCAINE 2% (20 MG/ML) 5 ML SYRINGE
INTRAMUSCULAR | Status: DC | PRN
  Administered 2021-10-29: 60 mg via INTRAVENOUS

## 2021-10-29 MED ORDER — ACETAMINOPHEN 500 MG PO TABS
ORAL_TABLET | ORAL | Status: AC
  Filled 2021-10-29: qty 2

## 2021-10-29 MED ORDER — CIPROFLOXACIN IN D5W 400 MG/200ML IV SOLN
INTRAVENOUS | Status: AC
  Filled 2021-10-29: qty 200

## 2021-10-29 MED ORDER — FENTANYL CITRATE (PF) 100 MCG/2ML IJ SOLN
INTRAMUSCULAR | Status: DC | PRN
  Administered 2021-10-29: 25 ug via INTRAVENOUS
  Administered 2021-10-29: 75 ug via INTRAVENOUS

## 2021-10-29 MED ORDER — PHENYLEPHRINE 80 MCG/ML (10ML) SYRINGE FOR IV PUSH (FOR BLOOD PRESSURE SUPPORT)
PREFILLED_SYRINGE | INTRAVENOUS | Status: DC | PRN
  Administered 2021-10-29: 80 ug via INTRAVENOUS
  Administered 2021-10-29: 160 ug via INTRAVENOUS

## 2021-10-29 MED ORDER — EPHEDRINE SULFATE-NACL 50-0.9 MG/10ML-% IV SOSY
PREFILLED_SYRINGE | INTRAVENOUS | Status: DC | PRN
  Administered 2021-10-29: 10 mg via INTRAVENOUS

## 2021-10-29 MED ORDER — LACTATED RINGERS IV SOLN: INTRAVENOUS | Status: DC

## 2021-10-29 MED ORDER — WHITE PETROLATUM EX OINT
TOPICAL_OINTMENT | CUTANEOUS | Status: AC
  Filled 2021-10-29: qty 5

## 2021-10-29 MED ORDER — DEXAMETHASONE SODIUM PHOSPHATE 10 MG/ML IJ SOLN
INTRAMUSCULAR | Status: AC
  Filled 2021-10-29: qty 1

## 2021-10-29 MED ORDER — EPHEDRINE 5 MG/ML INJ
INTRAVENOUS | Status: AC
  Filled 2021-10-29: qty 5

## 2021-10-29 MED ORDER — FENTANYL CITRATE (PF) 100 MCG/2ML IJ SOLN
25.0000 ug | INTRAMUSCULAR | Status: DC | PRN
  Administered 2021-10-29 (×2): 25 ug via INTRAVENOUS

## 2021-10-29 MED ORDER — LIDOCAINE HCL (PF) 2 % IJ SOLN
INTRAMUSCULAR | Status: AC
  Filled 2021-10-29: qty 5

## 2021-10-29 MED ORDER — SODIUM CHLORIDE 0.9 % IR SOLN
Status: DC | PRN
  Administered 2021-10-29 (×2): 3000 mL via INTRAVESICAL

## 2021-10-29 SURGICAL SUPPLY — 31 items
BAG DRAIN URO-CYSTO SKYTR STRL (DRAIN) ×2 IMPLANT
BAG DRN RND TRDRP ANRFLXCHMBR (UROLOGICAL SUPPLIES) ×1
BAG DRN UROCATH (DRAIN) ×1
BAG URINE DRAIN 2000ML AR STRL (UROLOGICAL SUPPLIES) ×1 IMPLANT
BAG URINE LEG 500ML (DRAIN) IMPLANT
CATH FOLEY 2WAY SLVR  5CC 18FR (CATHETERS) ×2
CATH FOLEY 2WAY SLVR  5CC 20FR (CATHETERS)
CATH FOLEY 2WAY SLVR  5CC 22FR (CATHETERS)
CATH FOLEY 2WAY SLVR 5CC 18FR (CATHETERS) IMPLANT
CATH FOLEY 2WAY SLVR 5CC 20FR (CATHETERS) IMPLANT
CATH FOLEY 2WAY SLVR 5CC 22FR (CATHETERS) IMPLANT
CATH FOLEY 3WAY 30CC 22FR (CATHETERS) IMPLANT
CLOTH BEACON ORANGE TIMEOUT ST (SAFETY) ×2 IMPLANT
ELECT REM PT RETURN 9FT ADLT (ELECTROSURGICAL)
ELECTRODE REM PT RTRN 9FT ADLT (ELECTROSURGICAL) ×1 IMPLANT
EVACUATOR MICROVAS BLADDER (UROLOGICAL SUPPLIES) IMPLANT
GLOVE BIO SURGEON STRL SZ8 (GLOVE) ×2 IMPLANT
GOWN STRL REUS W/TWL XL LVL3 (GOWN DISPOSABLE) ×2 IMPLANT
HOLDER FOLEY CATH W/STRAP (MISCELLANEOUS) IMPLANT
IV NS IRRIG 3000ML ARTHROMATIC (IV SOLUTION) ×4 IMPLANT
KIT TURNOVER CYSTO (KITS) ×2 IMPLANT
LOOP CUT BIPOLAR 24F LRG (ELECTROSURGICAL) ×2 IMPLANT
MANIFOLD NEPTUNE II (INSTRUMENTS) ×2 IMPLANT
NS IRRIG 500ML POUR BTL (IV SOLUTION) ×2 IMPLANT
PACK CYSTO (CUSTOM PROCEDURE TRAY) ×2 IMPLANT
PLUG CATH AND CAP STER (CATHETERS) IMPLANT
SYR TOOMEY IRRIG 70ML (MISCELLANEOUS) ×2
SYRINGE TOOMEY IRRIG 70ML (MISCELLANEOUS) ×1 IMPLANT
TUBE CONNECTING 12X1/4 (SUCTIONS) ×2 IMPLANT
TUBING UROLOGY SET (TUBING) ×1 IMPLANT
WATER STERILE IRR 500ML POUR (IV SOLUTION) IMPLANT

## 2021-10-29 NOTE — Op Note (Signed)
Preoperative diagnosis: History of high-grade nonmuscle invasive bladder cancer with recurrence  Postoperative diagnosis: Same  Procedure: Cystoscopy, transurethral resection of bladder tumor (largest diameter 10 mm)  Surgeon: Sabrie Moritz  Anesthesia: General with LMA  Complications: None  Specimen: Bladder tumors, to pathology  Estimated blood loss: Less than 50 mL  Indications: 71 year old male with initial resection of nonmuscle invasive bladder cancer and November, 2022 followed by repeat resection a month later.  He has completed BCG induction and maintenance.  Recent cystoscopy revealed recurrence in the dome of the bladder.  The largest of these lesions was 1 cm.  He presents now for repeat resection and possible gemcitabine placement.  Findings: Urethra was normal.  Prostate nonobstructive.  Urothelium of the bladder was normal except for areas of prior resection in the trigonal region with mild erythema but no raised urothelium.  2 papillary lesions were noted in the dome of the bladder, 1 just to the right of midline 10 mm in size, a smaller 1 to the left of midline on the lateral wall approximately 4 mm.  There was a cystic structure in the midline dome which disappeared upon filling the bladder.  This was covered with normal urothelium.  With the bladder decompressed, it presented again.  It disappeared after I touched it with the cautery.  Description of procedure: The patient was properly identified in the holding area.  He is taken to the operating room where general anesthetic was administered.  He was placed in the dorsolithotomy position, genitalia and perineum were prepped, draped, proper timeout performed.  36 French resectoscope sheath passed using the visual obturator.  Circumferential/systematic inspection then performed.  Above-mentioned findings noted.  The mild erythematous areas just above the trigone were resected.  At this point it was obvious that the bladder wall  was quite thin.  Resection of the papillary lesions, on the right dome and the left dome of the bladder was then performed.  There was a significant amount of bleeding from the left sided resection site even though this was not very deep.  I did, with the loop of the resectoscope, carefully cauterized the 1 or 2 small bleeders.  At this point there was hemostasis.  Even with very shallow resection of both of the sites, it was obvious that the bladder wall was quite thin.  This caused me to make the decision not to place gemcitabine and to leave the catheter in postoperatively for a longer period.  There being no further bleeding, the tumor fragments were rinsed from the bladder and saved for specimen labeled "bladder tumor".  27 French Foley catheter passed, balloon filled with 10 cc of water, this was hooked to dependent drainage.  At this point the procedure was terminated, the patient awakened, taken to the PACU in stable condition having tolerated the procedure well.

## 2021-10-29 NOTE — Anesthesia Preprocedure Evaluation (Addendum)
Anesthesia Evaluation  Patient identified by MRN, date of birth, ID band Patient awake    Reviewed: Allergy & Precautions, NPO status , Patient's Chart, lab work & pertinent test results  Airway Mallampati: II  TM Distance: >3 FB Neck ROM: Full    Dental no notable dental hx. (+) Teeth Intact, Dental Advisory Given   Pulmonary neg pulmonary ROS, former smoker,    Pulmonary exam normal breath sounds clear to auscultation       Cardiovascular negative cardio ROS Normal cardiovascular exam Rhythm:Regular Rate:Normal     Neuro/Psych negative neurological ROS  negative psych ROS   GI/Hepatic Neg liver ROS, GERD  ,  Endo/Other  negative endocrine ROS  Renal/GU negative Renal ROS  negative genitourinary   Musculoskeletal negative musculoskeletal ROS (+)   Abdominal   Peds  Hematology  (+) Blood dyscrasia (h/o hairy cell leukemia), ,   Anesthesia Other Findings   Reproductive/Obstetrics                            Anesthesia Physical Anesthesia Plan  ASA: 2  Anesthesia Plan: General   Post-op Pain Management: Tylenol PO (pre-op)*   Induction: Intravenous  PONV Risk Score and Plan: 2 and Ondansetron, Dexamethasone and Midazolam  Airway Management Planned: LMA and Oral ETT  Additional Equipment:   Intra-op Plan:   Post-operative Plan: Extubation in OR  Informed Consent: I have reviewed the patients History and Physical, chart, labs and discussed the procedure including the risks, benefits and alternatives for the proposed anesthesia with the patient or authorized representative who has indicated his/her understanding and acceptance.     Dental advisory given  Plan Discussed with: CRNA  Anesthesia Plan Comments:         Anesthesia Quick Evaluation

## 2021-10-29 NOTE — Anesthesia Procedure Notes (Signed)
Procedure Name: LMA Insertion Date/Time: 10/29/2021 9:54 AM  Performed by: Suan Halter, CRNAPre-anesthesia Checklist: Patient identified, Emergency Drugs available, Suction available and Patient being monitored Patient Re-evaluated:Patient Re-evaluated prior to induction Oxygen Delivery Method: Circle system utilized Preoxygenation: Pre-oxygenation with 100% oxygen Induction Type: IV induction Ventilation: Mask ventilation without difficulty LMA: LMA inserted LMA Size: 4.0 Number of attempts: 1 Airway Equipment and Method: Bite block Placement Confirmation: positive ETCO2 Tube secured with: Tape Dental Injury: Teeth and Oropharynx as per pre-operative assessment

## 2021-10-29 NOTE — H&P (Signed)
H&P  Chief Complaint: Bladder cancer  History of Present Illness: 71 year old male presents at this time for repeat TURBT.  Initial TURBT was performed in November, 2022 revealing high-grade nonmuscle invasive bladder cancer.  He underwent repeat resection a month later per protocol.  Following that, he underwent BCG induction.  He was recently found, upon surveillance cystoscopy, to have recurrent tumor.  He presents at this time for repeat TURBT/gemcitabine administration.  Past Medical History:  Diagnosis Date   Abnormal liver function tests 05/03/2011   Arthritis    Bladder cancer ([place]) 02/2021   high grade nonmuscle invasive bladder cancer   BPH (benign prostatic hypertrophy) 05/03/2011   Elevated transaminase level    GERD (gastroesophageal reflux disease)    Hairy cell leukemia, in remission ([place]) 1992   remission since 2008   IBS (irritable bowel syndrome)    Irritable bowel syndrome    2 yrs ago, states he had inflammation around anus. Biopsy: inclusive   Lumbar foraminal stenosis 12/31/2019   Lumbar radiculopathy 11/15/2019    Past Surgical History:  Procedure Laterality Date   BACK SURGERY     BONE MARROW BIOPSY     2007 and 1992   CATARACT EXTRACTION W/PHACO Right 08/15/2020   Procedure: CATARACT EXTRACTION PHACO AND INTRAOCULAR LENS PLACEMENT RIGHT EYE;  Surgeon: Baruch Goldmann, MD;  Location: [facility];  Service: Ophthalmology;  Laterality: Right;  right CDE=7.75   COLONOSCOPY  04/19/2006   with polyps   COLONOSCOPY  09/30/2011   Procedure: COLONOSCOPY;  Surgeon: Rogene Houston, MD;  [facility];  Service: Endoscopy;  Laterality: N/A;  730   COLONOSCOPY N/A 01/11/2019   Procedure: COLONOSCOPY;  Surgeon: Rogene Houston, MD;  [facility];  Service: Endoscopy;  Laterality: N/A;  100-office move to 9:30am   CYSTOSCOPY W/ RETROGRADES Bilateral 02/23/2021   Procedure: CYSTOSCOPY WITH RETROGRADE PYELOGRAM;  Surgeon: Franchot Gallo, MD;   Location: [facility];  Service: Urology;  Laterality: Bilateral;   HX COLON POLYPS     TONSILLECTOMY     childhood   TRANSFORAMINAL LUMBAR INTERBODY FUSION W/ MIS 1 LEVEL Right 11/18/2020   Procedure: Right Lumbar Five-Sacral One Minimally invasive transforaminal lumbar interbody fusion;  Surgeon: Judith Part, MD;  Location: [place];  Service: Neurosurgery;  Laterality: Right;   TRANSURETHRAL RESECTION OF BLADDER TUMOR N/A 03/26/2021   Procedure: REPEAT TRANSURETHRAL RESECTION OF BLADDER TUMOR (TURBT)/ POST OPERATIVE INSTILLATION OF GEMCITABINE;  Surgeon: Franchot Gallo, MD;  Location: [facility];  Service: Urology;  Laterality: N/A;   TRANSURETHRAL RESECTION OF BLADDER TUMOR WITH MITOMYCIN-C N/A 02/23/2021   Procedure: TRANSURETHRAL RESECTION OF BLADDER TUMOR;  Surgeon: Franchot Gallo, MD;  Location: [facility];  Service: Urology;  Laterality: N/A;    Home Medications:    Allergies:  Allergies  Allergen Reactions   Penicillins Swelling    Did it involve swelling of the face/tongue/throat, SOB, or low BP? No Did it involve sudden or severe rash/hives, skin peeling, or any reaction on the inside of your mouth or nose? No Did you need to seek medical attention at a hospital or doctor's office? Was already inpatient at the time When did it last happen?      2005 If all above answers are "NO", may proceed with cephalosporin use.     History reviewed. No pertinent family history.  Social History:  reports that he quit smoking about 15 years ago. His smoking use included cigarettes. He has never  used smokeless tobacco. He reports current alcohol use of about 4.0 standard drinks of alcohol per week. He reports that he does not use drugs.  ROS: A complete review of systems was performed.  All systems are negative except for pertinent findings as noted.  Physical Exam:  Vital signs in last 24 hours: BP (!) 143/65   Pulse (!) 56    Temp 97.6 F (36.4 C) (Oral)   Resp 15   Ht 6' (1.829 m)   Wt 77 kg   SpO2 99%   BMI 23.03 kg/m  Constitutional:  Alert and oriented, No acute distress Cardiovascular: Regular rate  Respiratory: Normal respiratory effort GI: Abdomen is soft, nontender, nondistended, no abdominal masses. No CVAT.  Genitourinary: Normal male phallus, testes are descended bilaterally and non-tender and without masses, scrotum is normal in appearance without lesions or masses, perineum is normal on inspection. Lymphatic: No lymphadenopathy Neurologic: Grossly intact, no focal deficits Psychiatric: Normal mood and affect  I have reviewed prior pt notes  I have reviewed notes from referring/previous physicians  I have reviewed urinalysis results  I have independently reviewed prior imaging  I have reviewed prior PSA results  I have reviewed prior urine culture   Impression/Assessment:  Recurrent urothelial carcinoma the bladder  Plan:  TURBT, gemcitabine administration

## 2021-10-29 NOTE — Interval H&P Note (Signed)
History and Physical Interval Note:  10/29/2021 9:44 AM  Cameron Wu  has presented today for surgery, with the diagnosis of RECURRENT BLADDER CANCER.  The various methods of treatment have been discussed with the patient and family. After consideration of risks, benefits and other options for treatment, the patient has consented to  Procedure(s): TRANSURETHRAL RESECTION OF BLADDER TUMOR WITH GEMCITABINE (N/A) as a surgical intervention.  The patient's history has been reviewed, patient examined, no change in status, stable for surgery.  I have reviewed the patient's chart and labs.  Questions were answered to the patient's satisfaction.     Lillette Boxer Maricela Kawahara

## 2021-10-29 NOTE — Discharge Instructions (Addendum)
You may see some blood in the urine and may have some burning with urination for 48-72 hours. You also may notice that you have to urinate more frequently or urgently after your procedure which is normal.  You should call should you develop an inability urinate, fever > 101, persistent nausea and vomiting that prevents you from eating or drinking to stay hydrated.  If you have a catheter, you will be taught how to take care of the catheter by the nursing staff prior to discharge from the hospital.  You may periodically feel a strong urge to void with the catheter in place.  This is a bladder spasm and most often can occur when having a bowel movement or moving around. It is typically self-limited and usually will stop after a few minutes.  You may use some Vaseline or Neosporin around the tip of the catheter to reduce friction at the tip of the penis. You may also see some blood in the urine.  A very small amount of blood can make the urine look quite red.  As long as the catheter is draining well, there usually is not a problem.  However, if the catheter is not draining well and is bloody, you should call the office [phone]) to notify us.  Leave the catheter in until Monday morning, it is okay to remove it at that point.    Post Anesthesia Home Care Instructions  Activity: Get plenty of rest for the remainder of the day. A responsible individual must stay with you for 24 hours following the procedure.  For the next 24 hours, DO NOT: -Drive a car -Paediatric nurse -Drink alcoholic beverages -Take any medication unless instructed by your physician -Make any legal decisions or sign important papers.  Meals: Start with liquid foods such as gelatin or soup. Progress to regular foods as tolerated. Avoid greasy, spicy, heavy foods. If nausea and/or vomiting occur, drink only clear liquids until the nausea and/or vomiting subsides. Call your physician if vomiting continues.  Special  Instructions/Symptoms: Your throat may feel dry or sore from the anesthesia or the breathing tube placed in your throat during surgery. If this causes discomfort, gargle with warm salt water. The discomfort should disappear within 24 hours.  Do not take any Tylenol until after 2:30 pm today if needed.

## 2021-10-29 NOTE — Transfer of Care (Signed)
Immediate Anesthesia Transfer of Care Note  Patient: Cameron Wu  Procedure(s) Performed: Procedure(s) (LRB): TRANSURETHRAL RESECTION OF BLADDER TUMOR WITH GEMCITABINE (N/A)  Patient Location: PACU  Anesthesia Type: General  Level of Consciousness: awake, oriented, sedated and patient cooperative  Airway & Oxygen Therapy: Patient Spontanous Breathing and Patient connected to face mask oxygen  Post-op Assessment: Report given to PACU RN and Post -op Vital signs reviewed and stable  Post vital signs: Reviewed and stable  Complications: No apparent anesthesia complications Last Vitals:  Vitals Value Taken Time  BP 135/77 10/29/21 1034  Temp    Pulse 58 10/29/21 1035  Resp 14 10/29/21 1035  SpO2 99 % 10/29/21 1035  Vitals shown include unvalidated device data.  Last Pain:  Vitals:   10/29/21 0802  TempSrc: Oral         Complications: No notable events documented.

## 2021-10-30 ENCOUNTER — Encounter (HOSPITAL_BASED_OUTPATIENT_CLINIC_OR_DEPARTMENT_OTHER): Payer: Self-pay | Admitting: Urology

## 2021-10-30 LAB — SURGICAL PATHOLOGY

## 2021-10-30 NOTE — Anesthesia Postprocedure Evaluation (Signed)
Anesthesia Post Note  Patient: Cameron Wu  Procedure(s) Performed: TRANSURETHRAL RESECTION OF BLADDER TUMOR WITH GEMCITABINE     Patient location during evaluation: PACU Anesthesia Type: General Level of consciousness: awake and alert Pain management: pain level controlled Vital Signs Assessment: post-procedure vital signs reviewed and stable Respiratory status: spontaneous breathing, nonlabored ventilation, respiratory function stable and patient connected to nasal cannula oxygen Cardiovascular status: blood pressure returned to baseline and stable Postop Assessment: no apparent nausea or vomiting Anesthetic complications: no   No notable events documented.  Last Vitals:  Vitals:   10/29/21 1106 10/29/21 1155  BP: 138/68 129/76  Pulse: (!) 54 (!) 52  Resp: 17 16  Temp:  (!) 36.3 C  SpO2: 100% 100%    Last Pain:  Vitals:   10/29/21 1155  TempSrc:   PainSc: 5                  Delisha Peaden L Zakara Parkey

## 2021-11-17 ENCOUNTER — Ambulatory Visit (INDEPENDENT_AMBULATORY_CARE_PROVIDER_SITE_OTHER): Payer: Medicare PPO | Admitting: Urology

## 2021-11-17 VITALS — BP 150/72 | HR 70 | Ht 72.0 in | Wt 170.0 lb

## 2021-11-17 DIAGNOSIS — C678 Malignant neoplasm of overlapping sites of bladder: Secondary | ICD-10-CM

## 2021-11-17 LAB — URINALYSIS, ROUTINE W REFLEX MICROSCOPIC
Bilirubin, UA: NEGATIVE
Glucose, UA: NEGATIVE
Ketones, UA: NEGATIVE
Nitrite, UA: NEGATIVE
Protein,UA: NEGATIVE
Specific Gravity, UA: 1.025 (ref 1.005–1.030)
Urobilinogen, Ur: 0.2 mg/dL (ref 0.2–1.0)
pH, UA: 5.5 (ref 5.0–7.5)

## 2021-11-17 LAB — MICROSCOPIC EXAMINATION: Renal Epithel, UA: NONE SEEN /hpf

## 2021-11-17 MED ORDER — BCG LIVE 50 MG IS SUSR
3.2400 mL | Freq: Once | INTRAVESICAL | Status: AC
  Administered 2021-11-17: 81 mg via INTRAVESICAL

## 2021-11-17 NOTE — Progress Notes (Signed)
History of Present Illness: Here for followup of bladder cancer.   11.7.2022: TURBT-  Pathology-high-grade nonmuscle invasive bladder cancer.--6.5 cm posterior wall lesion.  Muscularis present in specimen.  He did not receive gemcitabine.   12.8.2022: Repeat TURBT--8 mm Lt anterior wall papillary lesion, repeat posterior bladder wall lesion resection. Both specimens revealed HG NMIBC. Gemcitabine administered.   2.8.2023: Induction BCG (6 rxs) completed.   3.21.2023: Here for follow-up with cystoscopy.    5.10.2023: Completed first BCG maintenance  6.27.2023: Here for surveillance cystoscopy.  Significant erythematous area in his trigonal region as well as 2-3 papillary lesions in his bladder dome.  7.13.2023: He underwent biopsy of his trigonal area as well as the papillary lesions. Path--Benign submucosa with chronic inflammation . He has a very thin blladder wall, and gemcitabine not administered.  8.1.2023: HE did wel w/ Bx. Despite negative pathology, he did have papillary recurrences @ dome of bladder.   Past Medical History:  Diagnosis Date   Abnormal liver function tests 05/03/2011   Arthritis    Bladder cancer ([place]) 02/2021   high grade nonmuscle invasive bladder cancer   BPH (benign prostatic hypertrophy) 05/03/2011   Elevated transaminase level    GERD (gastroesophageal reflux disease)    Hairy cell leukemia, in remission ([place]) 1992   remission since 2008   IBS (irritable bowel syndrome)    Irritable bowel syndrome    2 yrs ago, states he had inflammation around anus. Biopsy: inclusive   Lumbar foraminal stenosis 12/31/2019   Lumbar radiculopathy 11/15/2019    Past Surgical History:  Procedure Laterality Date   BACK SURGERY     BONE MARROW BIOPSY     2007 and 1992   CATARACT EXTRACTION W/PHACO Right 08/15/2020   Procedure: CATARACT EXTRACTION PHACO AND INTRAOCULAR LENS PLACEMENT RIGHT EYE;  Surgeon: Baruch Goldmann, MD;  [facility]: [facility];  Service:  Ophthalmology;  Laterality: Right;  right CDE=7.75   COLONOSCOPY  04/19/2006   with polyps   COLONOSCOPY  09/30/2011   Procedure: COLONOSCOPY;  Surgeon: Rogene Houston, MD;  [facility];  Service: Endoscopy;  Laterality: N/A;  730   COLONOSCOPY N/A 01/11/2019   Procedure: COLONOSCOPY;  Surgeon: Rogene Houston, MD;  [facility];  Service: Endoscopy;  Laterality: N/A;  100-office move to 9:30am   CYSTOSCOPY W/ RETROGRADES Bilateral 02/23/2021   Procedure: CYSTOSCOPY WITH RETROGRADE PYELOGRAM;  Surgeon: Franchot Gallo, MD;  Location: [facility];  Service: Urology;  Laterality: Bilateral;   HX COLON POLYPS     TONSILLECTOMY     childhood   TRANSFORAMINAL LUMBAR INTERBODY FUSION W/ MIS 1 LEVEL Right 11/18/2020   Procedure: Right Lumbar Five-Sacral One Minimally invasive transforaminal lumbar interbody fusion;  Surgeon: Judith Part, MD;  Location: [place];  Service: Neurosurgery;  Laterality: Right;   TRANSURETHRAL RESECTION OF BLADDER TUMOR N/A 03/26/2021   Procedure: REPEAT TRANSURETHRAL RESECTION OF BLADDER TUMOR (TURBT)/ POST OPERATIVE INSTILLATION OF GEMCITABINE;  Surgeon: Franchot Gallo, MD;  Location: [facility];  Service: Urology;  Laterality: N/A;   TRANSURETHRAL RESECTION OF BLADDER TUMOR WITH MITOMYCIN-C N/A 02/23/2021   Procedure: TRANSURETHRAL RESECTION OF BLADDER TUMOR;  Surgeon: Franchot Gallo, MD;  Location: [facility];  Service: Urology;  Laterality: N/A;   TRANSURETHRAL RESECTION OF BLADDER TUMOR WITH MITOMYCIN-C N/A 10/29/2021   Procedure: TRANSURETHRAL RESECTION OF BLADDER TUMOR WITH GEMCITABINE;  Surgeon: Franchot Gallo, MD;  Location: [facility];  Service: Urology;  Laterality: N/A;  Home Medications:  Allergies as of 11/17/2021       Reactions   Penicillins Swelling   Did it involve swelling of the face/tongue/throat, SOB, or low BP? No Did it involve sudden or  severe rash/hives, skin peeling, or any reaction on the inside of your mouth or nose? No Did you need to seek medical attention at a hospital or doctor's office? Was already inpatient at the time When did it last happen?      2005 If all above answers are "NO", may proceed with cephalosporin use.        Medication List        Accurate as of November 17, 2021  2:21 PM. If you have any questions, ask your nurse or doctor.          CHARCOAL ACTIVATED PO Take 520 mg by mouth as needed. Over week   Clear Eyes Seasonal Relief 0.012-0.2-0.25 % Soln Generic drug: Naphazoline-Glycerin-Zinc Sulf Place 1 drop into both eyes daily as needed (allergies).   diphenhydrAMINE 25 mg capsule Commonly known as: BENADRYL Take 25-50 mg by mouth every 6 (six) hours as needed for allergies or sleep.   finasteride 5 MG tablet Commonly known as: PROSCAR TAKE ONE TABLET BY MOUTH EVERY MORNING   FISH OIL OMEGA-3 PO Take 1,000 mg by mouth daily. 300 mg omega 3. Stopped on 03/13/21 for 03/26/21 surgery.   GINKOBA PO Take 120 mg by mouth as needed. Stopped will restart after surgery   ibuprofen 200 MG tablet Commonly known as: ADVIL Take 400 mg by mouth every 6 (six) hours as needed (Inflammation).   phenazopyridine 200 MG tablet Commonly known as: Pyridium Take 1 tablet (200 mg total) by mouth 3 (three) times daily as needed for pain.   psyllium 58.6 % packet Commonly known as: METAMUCIL Take 1 packet by mouth as needed (constipation).   rosuvastatin 10 MG tablet Commonly known as: CRESTOR Take 10 mg by mouth at bedtime.   tamsulosin 0.4 MG Caps capsule Commonly known as: FLOMAX TAKE ONE CAPSULE BY MOUTH EVERY NIGHT AT BEDTIME        Allergies:  Allergies  Allergen Reactions   Penicillins Swelling    Did it involve swelling of the face/tongue/throat, SOB, or low BP? No Did it involve sudden or severe rash/hives, skin peeling, or any reaction on the inside of your mouth or nose?  No Did you need to seek medical attention at a hospital or doctor's office? Was already inpatient at the time When did it last happen?      2005 If all above answers are "NO", may proceed with cephalosporin use.     No family history on file.  Social History:  reports that he quit smoking about 15 years ago. His smoking use included cigarettes. He has never used smokeless tobacco. He reports current alcohol use of about 4.0 standard drinks of alcohol per week. He reports that he does not use drugs.  ROS: A complete review of systems was performed.  All systems are negative except for pertinent findings as noted.  Physical Exam:  Vital signs in last 24 hours: BP (!) 150/72   Pulse 70   Ht 6' (1.829 m)   Wt 170 lb (77.1 kg)   BMI 23.06 kg/m  Constitutional:  Alert and oriented, No acute distress Cardiovascular: Regular rate y Neurologic: Grossly intact, no focal deficits Psychiatric: Normal mood and affect  I have reviewed prior pt notes  I have reviewed notes from referring/previous  physicians  I have reviewed urinalysis results  I have independently reviewed prior imaging  I have reviewed prior pathology results   Impression/Assessment:  HG NMIBC--recent recurrence treated w/ TURBT.  Plan:  We will continue BCG w/ maintenance treatment today  I'll see back at appropriate time for repeat cysto

## 2021-11-17 NOTE — Patient Instructions (Signed)

## 2021-11-17 NOTE — Progress Notes (Signed)
BCG Bladder Instillation  BCG # 1 of 3  Due to Bladder Cancer patient is present today for a BCG treatment. Patient was cleaned and prepped in a sterile fashion with betadine. A 14FR catheter was inserted, urine return was noted 69m, urine was yellow in color.  554mof reconstituted BCG was instilled into the bladder. The catheter was then removed. Patient tolerated well, no complications were noted  Performed by: KoLevi AlandCMA  Follow up/ Additional notes: Follow up as scheduled.

## 2021-11-25 DIAGNOSIS — D84821 Immunodeficiency due to drugs: Secondary | ICD-10-CM | POA: Diagnosis not present

## 2021-11-25 DIAGNOSIS — C679 Malignant neoplasm of bladder, unspecified: Secondary | ICD-10-CM | POA: Diagnosis not present

## 2021-11-25 DIAGNOSIS — Z803 Family history of malignant neoplasm of breast: Secondary | ICD-10-CM | POA: Diagnosis not present

## 2021-11-25 DIAGNOSIS — Z87892 Personal history of anaphylaxis: Secondary | ICD-10-CM | POA: Diagnosis not present

## 2021-11-25 DIAGNOSIS — Z87891 Personal history of nicotine dependence: Secondary | ICD-10-CM | POA: Diagnosis not present

## 2021-11-25 DIAGNOSIS — H547 Unspecified visual loss: Secondary | ICD-10-CM | POA: Diagnosis not present

## 2021-11-25 DIAGNOSIS — N400 Enlarged prostate without lower urinary tract symptoms: Secondary | ICD-10-CM | POA: Diagnosis not present

## 2021-11-25 DIAGNOSIS — E785 Hyperlipidemia, unspecified: Secondary | ICD-10-CM | POA: Diagnosis not present

## 2021-11-25 DIAGNOSIS — Z823 Family history of stroke: Secondary | ICD-10-CM | POA: Diagnosis not present

## 2021-11-30 ENCOUNTER — Ambulatory Visit (INDEPENDENT_AMBULATORY_CARE_PROVIDER_SITE_OTHER): Payer: Medicare PPO

## 2021-11-30 DIAGNOSIS — C678 Malignant neoplasm of overlapping sites of bladder: Secondary | ICD-10-CM

## 2021-11-30 MED ORDER — BCG LIVE 50 MG IS SUSR
3.2400 mL | Freq: Once | INTRAVESICAL | Status: AC
  Administered 2021-11-30: 81 mg via INTRAVESICAL

## 2021-11-30 NOTE — Progress Notes (Unsigned)
BCG Bladder Instillation  BCG # 1 of 3  Due to Bladder Cancer patient is present today for a BCG treatment. Patient was cleaned and prepped in a sterile fashion with betadine. A 14FR catheter was inserted, urine return was noted 31m, urine was yellow in color.  524mof reconstituted BCG was instilled into the bladder. The catheter was then removed. Patient tolerated well, no complications were noted  Performed by: AmEstill BambergN  Follow up/ Additional notes:  1 week BCG

## 2021-12-01 ENCOUNTER — Ambulatory Visit: Payer: Medicare PPO | Admitting: Urology

## 2021-12-02 LAB — URINALYSIS, ROUTINE W REFLEX MICROSCOPIC
Bilirubin, UA: NEGATIVE
Glucose, UA: NEGATIVE
Leukocytes,UA: NEGATIVE
Nitrite, UA: NEGATIVE
Protein,UA: NEGATIVE
RBC, UA: NEGATIVE
Specific Gravity, UA: 1.01 (ref 1.005–1.030)
Urobilinogen, Ur: 0.2 mg/dL (ref 0.2–1.0)
pH, UA: 5 (ref 5.0–7.5)

## 2021-12-04 ENCOUNTER — Telehealth: Payer: Self-pay | Admitting: *Deleted

## 2021-12-04 NOTE — Patient Outreach (Signed)
  Care Coordination   12/04/2021 Name: Cameron Wu MRN: [id number] DOB: 04/15/1951   Care Coordination Outreach Attempts:  An unsuccessful telephone outreach was attempted today to offer the patient information about available care coordination services as a benefit of their health plan.   Follow Up Plan:  Additional outreach attempts will be made to offer the patient care coordination information and services.   Encounter Outcome:  No Answer  Care Coordination Interventions Activated:  No   Care Coordination Interventions:  No, not indicated    Chong Sicilian, BSN, RN-BC RN Care Coordinator Direct Dial: [phone]

## 2021-12-08 ENCOUNTER — Ambulatory Visit: Payer: Medicare PPO | Admitting: Urology

## 2021-12-08 ENCOUNTER — Ambulatory Visit (INDEPENDENT_AMBULATORY_CARE_PROVIDER_SITE_OTHER): Payer: Medicare PPO | Admitting: Physician Assistant

## 2021-12-08 ENCOUNTER — Ambulatory Visit: Payer: Medicare PPO

## 2021-12-08 DIAGNOSIS — C678 Malignant neoplasm of overlapping sites of bladder: Secondary | ICD-10-CM

## 2021-12-08 LAB — URINALYSIS, ROUTINE W REFLEX MICROSCOPIC
Bilirubin, UA: NEGATIVE
Glucose, UA: NEGATIVE
Ketones, UA: NEGATIVE
Leukocytes,UA: NEGATIVE
Nitrite, UA: NEGATIVE
Protein,UA: NEGATIVE
Specific Gravity, UA: 1.02 (ref 1.005–1.030)
Urobilinogen, Ur: 0.2 mg/dL (ref 0.2–1.0)
pH, UA: 5.5 (ref 5.0–7.5)

## 2021-12-08 LAB — MICROSCOPIC EXAMINATION
Epithelial Cells (non renal): NONE SEEN /hpf (ref 0–10)
Renal Epithel, UA: NONE SEEN /hpf
WBC, UA: NONE SEEN /hpf (ref 0–5)

## 2021-12-08 MED ORDER — BCG LIVE 50 MG IS SUSR
3.2400 mL | Freq: Once | INTRAVESICAL | Status: AC
  Administered 2021-12-08: 81 mg via INTRAVESICAL

## 2021-12-08 NOTE — Progress Notes (Signed)
BCG Bladder Instillation  BCG # 2 of 3  Due to Bladder Cancer patient is present today for a BCG treatment. Patient was cleaned and prepped in a sterile fashion with betadine. A 14 FR catheter was inserted, urine return was noted 100 ml, urine was yellow in color.  53m of reconstituted BCG was instilled into the bladder. The catheter was then removed. Patient tolerated well, no complications were noted  Performed by: SDarcella Gasman CMA  Follow up/ Additional notes: Follow as schedule

## 2021-12-14 ENCOUNTER — Ambulatory Visit (INDEPENDENT_AMBULATORY_CARE_PROVIDER_SITE_OTHER): Payer: Medicare PPO | Admitting: Physician Assistant

## 2021-12-14 DIAGNOSIS — C678 Malignant neoplasm of overlapping sites of bladder: Secondary | ICD-10-CM | POA: Diagnosis not present

## 2021-12-14 MED ORDER — BCG LIVE 50 MG IS SUSR
3.2400 mL | Freq: Once | INTRAVESICAL | Status: AC
  Administered 2021-12-14: 81 mg via INTRAVESICAL

## 2021-12-14 NOTE — Progress Notes (Unsigned)
BCG Bladder Instillation  BCG # 3 of 3  Due to Bladder Cancer patient is present today for a BCG treatment. Patient was cleaned and prepped in a sterile fashion with betadine. A 14FR catheter was inserted, urine return was noted 62m, urine was yellow in color.  525mof reconstituted BCG was instilled into the bladder. The catheter was then removed. Patient tolerated well, no complications were noted  Performed by: KoLevi AlandCMA  Follow up/ Additional notes: Follow up as scheduled.

## 2021-12-15 ENCOUNTER — Ambulatory Visit: Payer: Medicare PPO | Admitting: Urology

## 2021-12-23 ENCOUNTER — Telehealth: Payer: Self-pay | Admitting: *Deleted

## 2021-12-23 NOTE — Patient Outreach (Signed)
  Care Coordination   12/23/2021 Name: Cameron Wu MRN: [id number] DOB: February 01, 1951   Care Coordination Outreach Attempts:  A second unsuccessful outreach was attempted today to offer the patient with information about available care coordination services as a benefit of their health plan.     Follow Up Plan:  Additional outreach attempts will be made to offer the patient care coordination information and services.   Encounter Outcome:  No Answer  Care Coordination Interventions Activated:  No   Care Coordination Interventions:  No, not indicated    Valente David, RN, MSN, [facility] Care Management Care Management Coordinator [phone]

## 2021-12-28 ENCOUNTER — Telehealth: Payer: Self-pay | Admitting: *Deleted

## 2021-12-28 NOTE — Patient Instructions (Signed)
Visit Information  Thank you for taking time to visit with me today. Please don't hesitate to contact me if I can be of assistance to you.  Following are the goals we discussed today:  Keep scheduled annual wellness visit with PCP in October.  Please call the Suicide and Crisis Lifeline: 988 call the [place] National Suicide Prevention Lifeline: 214-488-6367 or TTY: 450-383-0693 TTY 440-654-3209) to talk to a trained counselor call 1-800-273-TALK (toll free, 24 hour hotline) call the [facility]: [phone] if you are experiencing a [place] or [place] or need someone to talk to.  Patient verbalizes understanding of instructions and care plan provided today and agrees to view in [place]. Active MyChart status and patient understanding of how to access instructions and care plan via MyChart confirmed with patient.     The patient has been provided with contact information for the care management team and has been advised to call with any health related questions or concerns.   Valente David, RN, MSN, [place] Care Management Care Management Coordinator [phone]

## 2021-12-28 NOTE — Patient Outreach (Signed)
  Care Coordination   Initial Visit Note   12/28/2021 Name: Cameron Wu MRN: [id number] DOB: January 31, 1951  Cameron Wu is a 71 y.o. year old male who sees [name], MD for primary care. I spoke with  Cameron Wu by phone today.  What matters to the patients health and wellness today?  Member report he is doing well.  State his focus is staying as active and mobile as possible.  He has a regular exercise routine, walking and free weights, will continue to do this.  Denies any urgent concerns or need for follow up at this time, encouraged to contact this care manager with questions.       Goals Addressed             This Visit's Progress    COMPLETED: Care Coordination Activities - No Follow up needed       Care Coordination Interventions: Patient interviewed about adult health maintenance status including  Colonoscopy    Pneumonia Vaccine Influenza Vaccine COVID vaccination    Blood Pressure    Provided education about regular exercise routines to remain active and independent SDOH assessment completed Reviewed appointment for AWV - scheduled October 23        SDOH assessments and interventions completed:  Yes  SDOH Interventions Today    Flowsheet Row Most Recent Value  SDOH Interventions   Food Insecurity Interventions Intervention Not Indicated  Housing Interventions Intervention Not Indicated  Transportation Interventions Intervention Not Indicated  Utilities Interventions Intervention Not Indicated        Care Coordination Interventions Activated:  Yes  Care Coordination Interventions:  Yes, provided   Follow up plan: No further intervention required.   Encounter Outcome:  Pt. Visit Completed    Valente David, RN, MSN, [place] Care Management Care Management Coordinator [phone]

## 2022-01-04 DIAGNOSIS — H401131 Primary open-angle glaucoma, bilateral, mild stage: Secondary | ICD-10-CM | POA: Diagnosis not present

## 2022-01-04 DIAGNOSIS — H01002 Unspecified blepharitis right lower eyelid: Secondary | ICD-10-CM | POA: Diagnosis not present

## 2022-01-04 DIAGNOSIS — H01001 Unspecified blepharitis right upper eyelid: Secondary | ICD-10-CM | POA: Diagnosis not present

## 2022-01-04 DIAGNOSIS — H43811 Vitreous degeneration, right eye: Secondary | ICD-10-CM | POA: Diagnosis not present

## 2022-01-25 NOTE — Progress Notes (Signed)
History of Present Illness:   Here for followup of bladder cancer.   11.7.2022: TURBT-  Pathology-high-grade nonmuscle invasive bladder cancer.--6.5 cm posterior wall lesion.  Muscularis present in specimen.  He did not receive gemcitabine.   12.8.2022: Repeat TURBT--8 mm Lt anterior wall papillary lesion, repeat posterior bladder wall lesion resection. Both specimens revealed HG NMIBC. Gemcitabine administered.   2.8.2023: Induction BCG (6 rxs) completed.   3.21.2023: Here for follow-up with cystoscopy.    5.10.2023: Completed first BCG maintenance  6.27.2023: Here for surveillance cystoscopy.  Significant erythematous area in his trigonal region as well as 2-3 papillary lesions in his bladder dome.   7.13.2023: He underwent biopsy of his trigonal area as well as the papillary lesions. Path--Benign submucosa with chronic inflammation . He has a very thin bladder wall, and gemcitabine not administered.He did well w/ Bx. Despite negative pathology, he did have papillary recurrences @ dome of bladder.  8.28.2023: Completed 3 maintenance BCG Rxs.  10.10.2023:    Past Medical History:  Diagnosis Date   Abnormal liver function tests 05/03/2011   Arthritis    Bladder cancer ([place]) 02/2021   high grade nonmuscle invasive bladder cancer   BPH (benign prostatic hypertrophy) 05/03/2011   Elevated transaminase level    GERD (gastroesophageal reflux disease)    Hairy cell leukemia, in remission ([place]) 1992   remission since 2008   IBS (irritable bowel syndrome)    Irritable bowel syndrome    2 yrs ago, states he had inflammation around anus. Biopsy: inclusive   Lumbar foraminal stenosis 12/31/2019   Lumbar radiculopathy 11/15/2019    Past Surgical History:  Procedure Laterality Date   BACK SURGERY     BONE MARROW BIOPSY     2007 and 1992   CATARACT EXTRACTION W/PHACO Right 08/15/2020   Procedure: CATARACT EXTRACTION PHACO AND INTRAOCULAR LENS PLACEMENT RIGHT EYE;  Surgeon: Baruch Goldmann, MD;  [facility];  Service: Ophthalmology;  Laterality: Right;  right CDE=7.75   COLONOSCOPY  04/19/2006   with polyps   COLONOSCOPY  09/30/2011   Procedure: COLONOSCOPY;  Surgeon: Rogene Houston, MD;  [facility];  Service: Endoscopy;  Laterality: N/A;  730   COLONOSCOPY N/A 01/11/2019   Procedure: COLONOSCOPY;  Surgeon: Rogene Houston, MD;  [facility];  Service: Endoscopy;  Laterality: N/A;  100-office move to 9:30am   CYSTOSCOPY W/ RETROGRADES Bilateral 02/23/2021   Procedure: CYSTOSCOPY WITH RETROGRADE PYELOGRAM;  Surgeon: Franchot Gallo, MD;  Location: [facility];  Service: Urology;  Laterality: Bilateral;   HX COLON POLYPS     TONSILLECTOMY     childhood   TRANSFORAMINAL LUMBAR INTERBODY FUSION W/ MIS 1 LEVEL Right 11/18/2020   Procedure: Right Lumbar Five-Sacral One Minimally invasive transforaminal lumbar interbody fusion;  Surgeon: Judith Part, MD;  Location: [place];  Service: Neurosurgery;  Laterality: Right;   TRANSURETHRAL RESECTION OF BLADDER TUMOR N/A 03/26/2021   Procedure: REPEAT TRANSURETHRAL RESECTION OF BLADDER TUMOR (TURBT)/ POST OPERATIVE INSTILLATION OF GEMCITABINE;  Surgeon: Franchot Gallo, MD;  Location: [facility];  Service: Urology;  Laterality: N/A;   TRANSURETHRAL RESECTION OF BLADDER TUMOR WITH MITOMYCIN-C N/A 02/23/2021   Procedure: TRANSURETHRAL RESECTION OF BLADDER TUMOR;  Surgeon: Franchot Gallo, MD;  Location: [facility];  Service: Urology;  Laterality: N/A;   TRANSURETHRAL RESECTION OF BLADDER TUMOR WITH MITOMYCIN-C N/A 10/29/2021   Procedure: TRANSURETHRAL RESECTION OF BLADDER TUMOR WITH GEMCITABINE;  Surgeon: Franchot Gallo, MD;  Location: [facility];  Service: Urology;  Laterality: N/A;    Home Medications:  Allergies as of 01/26/2022       Reactions   Penicillins Swelling   Did it involve swelling of the face/tongue/throat,  SOB, or low BP? No Did it involve sudden or severe rash/hives, skin peeling, or any reaction on the inside of your mouth or nose? No Did you need to seek medical attention at a hospital or doctor's office? Was already inpatient at the time When did it last happen?      2005 If all above answers are "NO", may proceed with cephalosporin use.        Medication List        Accurate as of January 25, 2022 11:59 AM. If you have any questions, ask your nurse or doctor.          CHARCOAL ACTIVATED PO Take 520 mg by mouth as needed. Over week   Clear Eyes Seasonal Relief 0.012-0.2-0.25 % Soln Generic drug: Naphazoline-Glycerin-Zinc Sulf Place 1 drop into both eyes daily as needed (allergies).   diphenhydrAMINE 25 mg capsule Commonly known as: BENADRYL Take 25-50 mg by mouth every 6 (six) hours as needed for allergies or sleep.   finasteride 5 MG tablet Commonly known as: PROSCAR TAKE ONE TABLET BY MOUTH EVERY MORNING   FISH OIL OMEGA-3 PO Take 1,000 mg by mouth daily. 300 mg omega 3. Stopped on 03/13/21 for 03/26/21 surgery.   GINKOBA PO Take 120 mg by mouth as needed. Stopped will restart after surgery   ibuprofen 200 MG tablet Commonly known as: ADVIL Take 400 mg by mouth every 6 (six) hours as needed (Inflammation).   phenazopyridine 200 MG tablet Commonly known as: Pyridium Take 1 tablet (200 mg total) by mouth 3 (three) times daily as needed for pain.   psyllium 58.6 % packet Commonly known as: METAMUCIL Take 1 packet by mouth as needed (constipation).   rosuvastatin 10 MG tablet Commonly known as: CRESTOR Take 10 mg by mouth at bedtime.   tamsulosin 0.4 MG Caps capsule Commonly known as: FLOMAX TAKE ONE CAPSULE BY MOUTH EVERY NIGHT AT BEDTIME        Allergies:  Allergies  Allergen Reactions   Penicillins Swelling    Did it involve swelling of the face/tongue/throat, SOB, or low BP? No Did it involve sudden or severe rash/hives, skin peeling, or any  reaction on the inside of your mouth or nose? No Did you need to seek medical attention at a hospital or doctor's office? Was already inpatient at the time When did it last happen?      2005 If all above answers are "NO", may proceed with cephalosporin use.     No family history on file.  Social History:  reports that he quit smoking about 16 years ago. His smoking use included cigarettes. He has never used smokeless tobacco. He reports current alcohol use of about 4.0 standard drinks of alcohol per week. He reports that he does not use drugs.  ROS: A complete review of systems was performed.  All systems are negative except for pertinent findings as noted.  Physical Exam:  Vital signs in last 24 hours: There were no vitals taken for this visit. Constitutional:  Alert and oriented, No acute distress Cardiovascular: Regular rate  Respiratory: Normal respiratory effort GI: Abdomen is soft, nontender, nondistended, no abdominal masses. No CVAT.  Genitourinary: Normal male phallus, testes are descended bilaterally and non-tender and without masses, scrotum is normal in  appearance without lesions or masses, perineum is normal on inspection. Lymphatic: No lymphadenopathy Neurologic: Grossly intact, no focal deficits Psychiatric: Normal mood and affect  I have reviewed prior pt notes  I have reviewed notes from referring/previous physicians  I have reviewed urinalysis results  I have independently reviewed prior imaging  I have reviewed prior PSA results  I have reviewed prior urine culture   Impression/Assessment:  ***  Plan:  ***

## 2022-01-26 ENCOUNTER — Ambulatory Visit (INDEPENDENT_AMBULATORY_CARE_PROVIDER_SITE_OTHER): Payer: Medicare PPO | Admitting: Urology

## 2022-01-26 VITALS — BP 154/84 | HR 59 | Ht 72.0 in | Wt 170.0 lb

## 2022-01-26 DIAGNOSIS — D414 Neoplasm of uncertain behavior of bladder: Secondary | ICD-10-CM | POA: Diagnosis not present

## 2022-01-26 DIAGNOSIS — Z8551 Personal history of malignant neoplasm of bladder: Secondary | ICD-10-CM | POA: Diagnosis not present

## 2022-01-26 DIAGNOSIS — C678 Malignant neoplasm of overlapping sites of bladder: Secondary | ICD-10-CM | POA: Diagnosis not present

## 2022-01-26 DIAGNOSIS — I700 Atherosclerosis of aorta: Secondary | ICD-10-CM | POA: Diagnosis not present

## 2022-01-26 DIAGNOSIS — N3289 Other specified disorders of bladder: Secondary | ICD-10-CM

## 2022-01-26 DIAGNOSIS — N400 Enlarged prostate without lower urinary tract symptoms: Secondary | ICD-10-CM | POA: Diagnosis not present

## 2022-01-26 DIAGNOSIS — R7301 Impaired fasting glucose: Secondary | ICD-10-CM | POA: Diagnosis not present

## 2022-01-26 DIAGNOSIS — C9141 Hairy cell leukemia, in remission: Secondary | ICD-10-CM | POA: Diagnosis not present

## 2022-01-26 DIAGNOSIS — Z79899 Other long term (current) drug therapy: Secondary | ICD-10-CM | POA: Diagnosis not present

## 2022-01-26 LAB — MICROSCOPIC EXAMINATION
Bacteria, UA: NONE SEEN
Epithelial Cells (non renal): NONE SEEN /hpf (ref 0–10)

## 2022-01-26 LAB — URINALYSIS, ROUTINE W REFLEX MICROSCOPIC
Bilirubin, UA: NEGATIVE
Glucose, UA: NEGATIVE
Ketones, UA: NEGATIVE
Leukocytes,UA: NEGATIVE
Nitrite, UA: NEGATIVE
Protein,UA: NEGATIVE
Specific Gravity, UA: 1.01 (ref 1.005–1.030)
Urobilinogen, Ur: 0.2 mg/dL (ref 0.2–1.0)
pH, UA: 6 (ref 5.0–7.5)

## 2022-01-26 MED ORDER — CIPROFLOXACIN HCL 500 MG PO TABS
500.0000 mg | ORAL_TABLET | Freq: Once | ORAL | Status: AC
  Administered 2022-01-26: 500 mg via ORAL

## 2022-01-28 ENCOUNTER — Telehealth: Payer: Self-pay

## 2022-01-28 LAB — CYTOLOGY, URINE

## 2022-01-28 NOTE — Telephone Encounter (Signed)
-----   Message from Franchot Gallo, MD sent at 01/28/2022 11:09 AM EDT ----- I sent results via my chart. Please sched 3 bcg Rxs to start in 2 mos, as well as cysto w/ me in 4 mos ----- Message ----- From: Audie Box, CMA Sent: 01/28/2022   7:50 AM EDT To: Franchot Gallo, MD  Please review, no f/u scheduled.

## 2022-01-28 NOTE — Telephone Encounter (Signed)
3 BCG treatment's and Cysto apt scheduled for patient per MD.  I left voicemail asking patient to return call to confirm those apt.  Apt reminders mailed to patient and waiting for call back.  Do we need to get insurance approval for BCG maintenance?

## 2022-02-02 ENCOUNTER — Encounter: Payer: Self-pay | Admitting: Urology

## 2022-02-02 ENCOUNTER — Ambulatory Visit: Payer: Medicare PPO | Admitting: Urology

## 2022-02-02 VITALS — BP 145/77 | HR 69

## 2022-02-02 DIAGNOSIS — R972 Elevated prostate specific antigen [PSA]: Secondary | ICD-10-CM

## 2022-02-02 DIAGNOSIS — N3289 Other specified disorders of bladder: Secondary | ICD-10-CM

## 2022-02-02 DIAGNOSIS — Z8551 Personal history of malignant neoplasm of bladder: Secondary | ICD-10-CM

## 2022-02-02 DIAGNOSIS — N401 Enlarged prostate with lower urinary tract symptoms: Secondary | ICD-10-CM | POA: Diagnosis not present

## 2022-02-02 DIAGNOSIS — N138 Other obstructive and reflux uropathy: Secondary | ICD-10-CM

## 2022-02-02 LAB — URINALYSIS, ROUTINE W REFLEX MICROSCOPIC
Bilirubin, UA: NEGATIVE
Glucose, UA: NEGATIVE
Ketones, UA: NEGATIVE
Leukocytes,UA: NEGATIVE
Nitrite, UA: NEGATIVE
Protein,UA: NEGATIVE
RBC, UA: NEGATIVE
Specific Gravity, UA: 1.01 (ref 1.005–1.030)
Urobilinogen, Ur: 0.2 mg/dL (ref 0.2–1.0)
pH, UA: 5.5 (ref 5.0–7.5)

## 2022-02-02 MED ORDER — FINASTERIDE 5 MG PO TABS: 5.0000 mg | ORAL_TABLET | Freq: Every morning | ORAL | 3 refills | Status: DC

## 2022-02-02 MED ORDER — TAMSULOSIN HCL 0.4 MG PO CAPS: 0.4000 mg | ORAL_CAPSULE | Freq: Every day | ORAL | 3 refills | Status: DC

## 2022-02-02 NOTE — Progress Notes (Signed)
History of Present Illness: This man has high-grade nonmuscle invasive bladder cancer.  He was just seen a couple of weeks ago for his surveillance cystoscopy, and will start BCG maintenance in less than 2 months.  He comes in today for prostate check.  He was told by his pharmacy that he has to have a visit with me before his BPH medicines are refilled.  Currently, he is on both tamsulosin and finasteride.  Current IPSS 8, quality-of-life score 1.  Past Medical History:  Diagnosis Date   Abnormal liver function tests 05/03/2011   Arthritis    Bladder cancer ([place]) 02/2021   high grade nonmuscle invasive bladder cancer   BPH (benign prostatic hypertrophy) 05/03/2011   Elevated transaminase level    GERD (gastroesophageal reflux disease)    Hairy cell leukemia, in remission ([place]) 1992   remission since 2008   IBS (irritable bowel syndrome)    Irritable bowel syndrome    2 yrs ago, states he had inflammation around anus. Biopsy: inclusive   Lumbar foraminal stenosis 12/31/2019   Lumbar radiculopathy 11/15/2019    Past Surgical History:  Procedure Laterality Date   BACK SURGERY     BONE MARROW BIOPSY     2007 and 1992   CATARACT EXTRACTION W/PHACO Right 08/15/2020   Procedure: CATARACT EXTRACTION PHACO AND INTRAOCULAR LENS PLACEMENT RIGHT EYE;  Surgeon: Baruch Goldmann, MD;  Location: [facility];  Service: Ophthalmology;  Laterality: Right;  right CDE=7.75   COLONOSCOPY  04/19/2006   with polyps   COLONOSCOPY  09/30/2011   Procedure: COLONOSCOPY;  Surgeon: Rogene Houston, MD;  [facility];  Service: Endoscopy;  Laterality: N/A;  730   COLONOSCOPY N/A 01/11/2019   Procedure: COLONOSCOPY;  Surgeon: Rogene Houston, MD;  [facility];  Service: Endoscopy;  Laterality: N/A;  100-office move to 9:30am   CYSTOSCOPY W/ RETROGRADES Bilateral 02/23/2021   Procedure: CYSTOSCOPY WITH RETROGRADE PYELOGRAM;  Surgeon: Franchot Gallo, MD;  [facility];  Service: Urology;  Laterality: Bilateral;   HX COLON POLYPS     TONSILLECTOMY     childhood   TRANSFORAMINAL LUMBAR INTERBODY FUSION W/ MIS 1 LEVEL Right 11/18/2020   Procedure: Right Lumbar Five-Sacral One Minimally invasive transforaminal lumbar interbody fusion;  Surgeon: Judith Part, MD;  Location: [place];  Service: Neurosurgery;  Laterality: Right;   TRANSURETHRAL RESECTION OF BLADDER TUMOR N/A 03/26/2021   Procedure: REPEAT TRANSURETHRAL RESECTION OF BLADDER TUMOR (TURBT)/ POST OPERATIVE INSTILLATION OF GEMCITABINE;  Surgeon: Franchot Gallo, MD;  Location: [facility];  Service: Urology;  Laterality: N/A;   TRANSURETHRAL RESECTION OF BLADDER TUMOR WITH MITOMYCIN-C N/A 02/23/2021   Procedure: TRANSURETHRAL RESECTION OF BLADDER TUMOR;  Surgeon: Franchot Gallo, MD;  Location: [facility];  Service: Urology;  Laterality: N/A;   TRANSURETHRAL RESECTION OF BLADDER TUMOR WITH MITOMYCIN-C N/A 10/29/2021   Procedure: TRANSURETHRAL RESECTION OF BLADDER TUMOR WITH GEMCITABINE;  Surgeon: Franchot Gallo, MD;  Location: [facility];  Service: Urology;  Laterality: N/A;    Home Medications:  Allergies as of 02/02/2022       Reactions   Penicillins Swelling   Did it involve swelling of the face/tongue/throat, SOB, or low BP? No Did it involve sudden or severe rash/hives, skin peeling, or any reaction on the inside of your mouth or nose? No Did you need to seek medical attention at a hospital or doctor's office? Was already inpatient at the time When did it  last happen?      2005 If all above answers are "NO", may proceed with cephalosporin use.        Medication List        Accurate as of February 02, 2022 11:22 AM. If you have any questions, ask your nurse or doctor.          CHARCOAL ACTIVATED PO Take 520 mg by mouth as needed. Over week   Clear Eyes Seasonal Relief 0.012-0.2-0.25 % Soln Generic drug:  Naphazoline-Glycerin-Zinc Sulf Place 1 drop into both eyes daily as needed (allergies).   diphenhydrAMINE 25 mg capsule Commonly known as: BENADRYL Take 25-50 mg by mouth every 6 (six) hours as needed for allergies or sleep.   finasteride 5 MG tablet Commonly known as: PROSCAR Take 1 tablet (5 mg total) by mouth every morning.   FISH OIL OMEGA-3 PO Take 1,000 mg by mouth daily. 300 mg omega 3. Stopped on 03/13/21 for 03/26/21 surgery.   GINKOBA PO Take 120 mg by mouth as needed. Stopped will restart after surgery   ibuprofen 200 MG tablet Commonly known as: ADVIL Take 400 mg by mouth every 6 (six) hours as needed (Inflammation).   phenazopyridine 200 MG tablet Commonly known as: Pyridium Take 1 tablet (200 mg total) by mouth 3 (three) times daily as needed for pain.   psyllium 58.6 % packet Commonly known as: METAMUCIL Take 1 packet by mouth as needed (constipation).   rosuvastatin 10 MG tablet Commonly known as: CRESTOR Take 10 mg by mouth at bedtime.   tamsulosin 0.4 MG Caps capsule Commonly known as: FLOMAX Take 1 capsule (0.4 mg total) by mouth at bedtime.        Allergies:  Allergies  Allergen Reactions   Penicillins Swelling    Did it involve swelling of the face/tongue/throat, SOB, or low BP? No Did it involve sudden or severe rash/hives, skin peeling, or any reaction on the inside of your mouth or nose? No Did you need to seek medical attention at a hospital or doctor's office? Was already inpatient at the time When did it last happen?      2005 If all above answers are "NO", may proceed with cephalosporin use.     No family history on file.  Social History:  reports that he quit smoking about 16 years ago. His smoking use included cigarettes. He has never used smokeless tobacco. He reports current alcohol use of about 4.0 standard drinks of alcohol per week. He reports that he does not use drugs.  ROS: A complete review of systems was performed.  All  systems are negative except for pertinent findings as noted.  Physical Exam:  Vital signs in last 24 hours: BP (!) 145/77   Pulse 69  Constitutional:  Alert and oriented, No acute distress Cardiovascular: Regular rate  Respiratory: Normal respiratory effort Genitourinary: Normal anal sphincter tone.  Prostate 30 g, symmetric, nonnodular, nontender. Neurologic: Grossly intact, no focal deficits Psychiatric: Normal mood and affect  I have reviewed prior pt notes  I have reviewed urinalysis results  I have reviewed prior pathology results.  PSA data not available     Impression/Assessment:  1.  BPH, on dual medical therapy.  I think it might be worthwhile trying to wean off of tamsulosin  2.  High-grade nonmuscle invasive bladder cancer, recent cystoscopy negative.  He will be starting BCG maintenance in the near future    Plan:  1.  Both medications refilled, but I gave him instructions  on how to wean off the tamsulosin  2.  Keep scheduled appointments for BCG as well as surveillance cystoscopy

## 2022-02-08 ENCOUNTER — Other Ambulatory Visit (HOSPITAL_COMMUNITY): Payer: Self-pay | Admitting: Internal Medicine

## 2022-02-08 DIAGNOSIS — R413 Other amnesia: Secondary | ICD-10-CM

## 2022-02-08 DIAGNOSIS — N400 Enlarged prostate without lower urinary tract symptoms: Secondary | ICD-10-CM | POA: Diagnosis not present

## 2022-02-08 DIAGNOSIS — C9100 Acute lymphoblastic leukemia not having achieved remission: Secondary | ICD-10-CM | POA: Diagnosis not present

## 2022-02-08 DIAGNOSIS — I700 Atherosclerosis of aorta: Secondary | ICD-10-CM | POA: Diagnosis not present

## 2022-03-03 ENCOUNTER — Ambulatory Visit (HOSPITAL_COMMUNITY)
Admission: RE | Admit: 2022-03-03 | Discharge: 2022-03-03 | Disposition: A | Discharge status: Home / Self Care | Payer: Medicare PPO | Source: Ambulatory Visit | Attending: Internal Medicine | Admitting: Internal Medicine

## 2022-03-03 DIAGNOSIS — H9313 Tinnitus, bilateral: Secondary | ICD-10-CM | POA: Diagnosis not present

## 2022-03-03 DIAGNOSIS — R413 Other amnesia: Secondary | ICD-10-CM | POA: Insufficient documentation

## 2022-03-03 DIAGNOSIS — Z471 Aftercare following joint replacement surgery: Secondary | ICD-10-CM | POA: Diagnosis not present

## 2022-03-09 IMAGING — MR MR LUMBAR SPINE W/O CM
5 of 6 series · 27 of 48 positions shown · non-contrast
Comparison: None.

CLINICAL DATA: Slipped and fell early August 2019.  Low back pain.

EXAM:
MRI LUMBAR SPINE WITHOUT CONTRAST
TECHNIQUE: Multiplanar, multisequence MR imaging of the lumbar spine was
performed. No intravenous contrast was administered.

[Series 5: T1 · sagittal · 4.0mm · 0.81mm/px · 3 of 18 slices shown (1 of 2)]
[im 1/18]
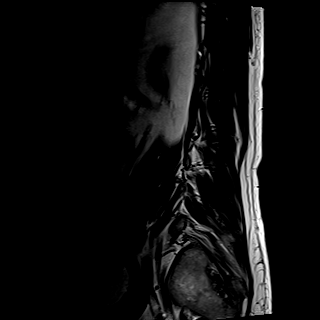
[im 9/18]
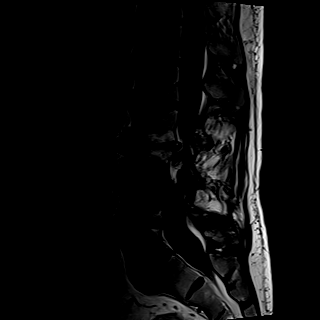
[im 18/18]
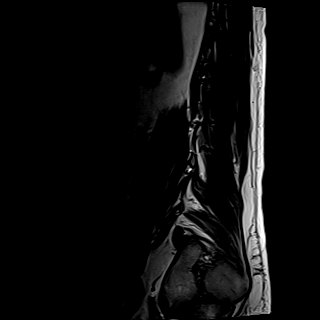

[Series 6: T2 · sagittal · 4.0mm · 0.81mm/px · 3 of 18 slices shown (1 of 2)]
[im 1/18]
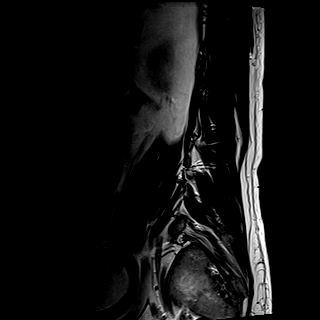
[im 9/18]
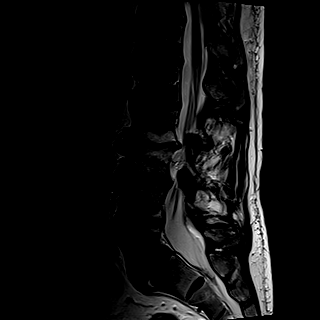
[im 18/18]
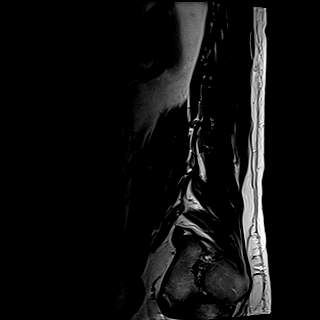

[Series 7: STIR · sagittal · 4.0mm · 0.51mm/px · 3 of 18 slices shown]
[im 1/18]
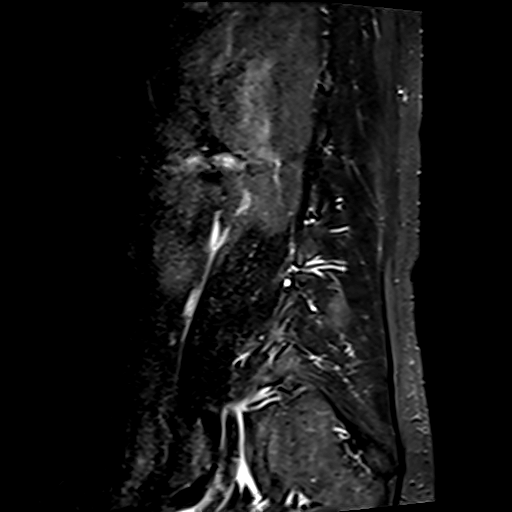
[im 9/18]
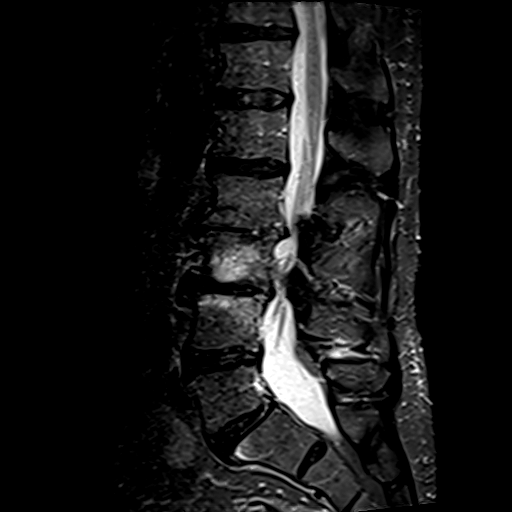
[im 18/18]
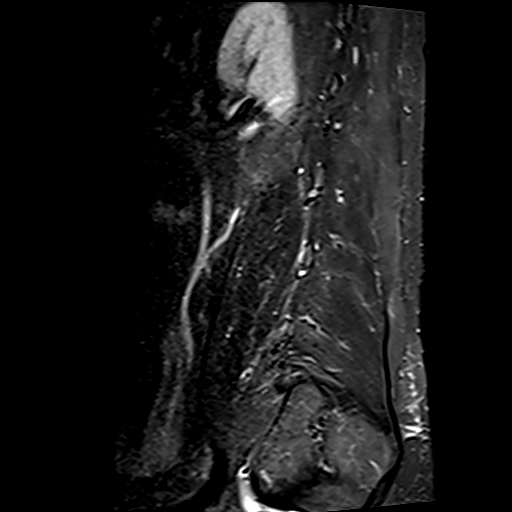

[Series 8: T2 · axial · 4.0mm · 0.78mm/px · z∈[-129,+172]mm · 9 of 54 slices shown (2 of 2)]
[im 1/54]
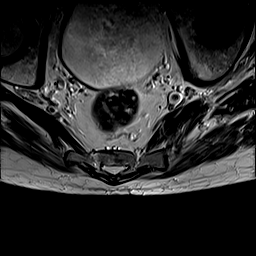
[im 7/54]
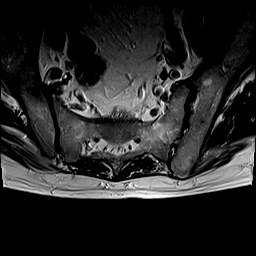
[im 14/54]
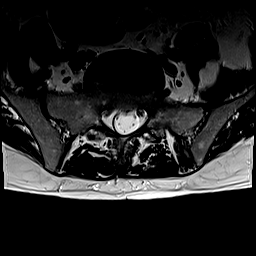
[im 20/54]
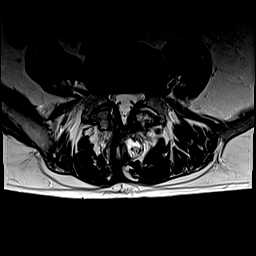
[im 27/54]
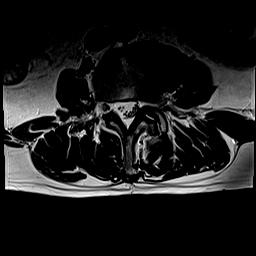
[im 34/54]
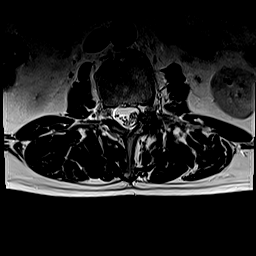
[im 40/54]
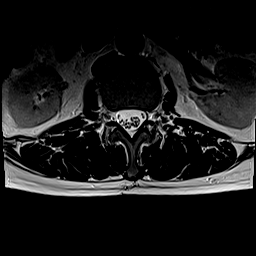
[im 47/54]
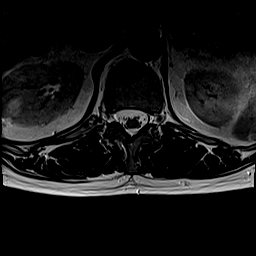
[im 54/54]
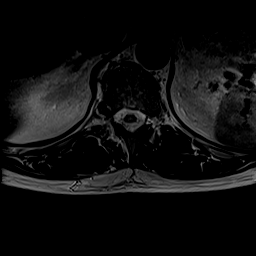

[Series 9: T1 · axial · 4.0mm · 0.39mm/px · z∈[-129,+172]mm · 9 of 54 slices shown (2 of 2)]
[im 1/54]
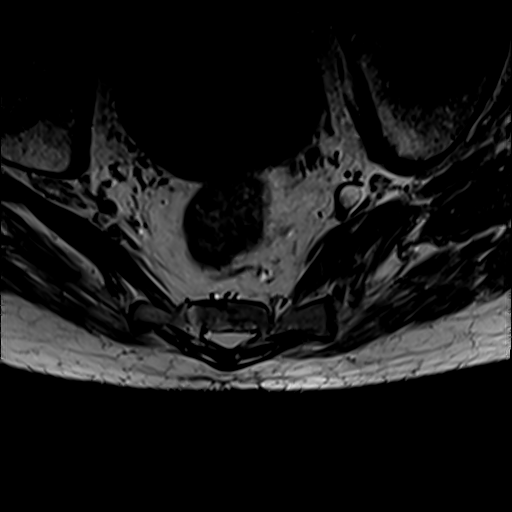
[im 7/54]
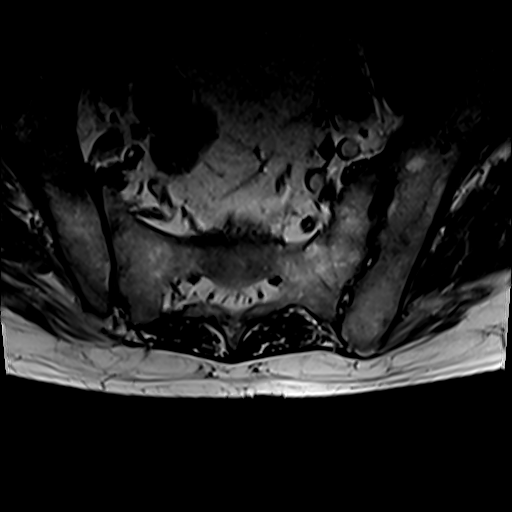
[im 14/54]
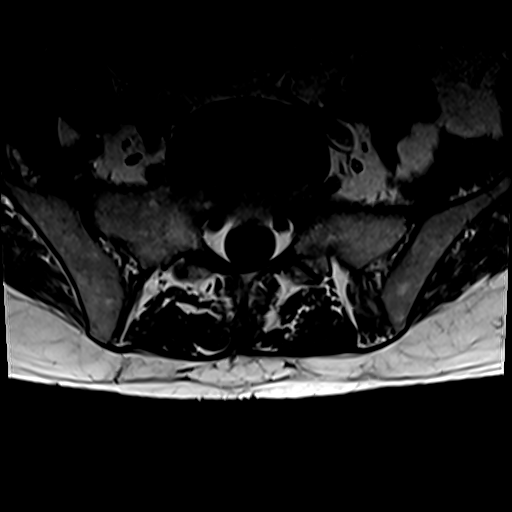
[im 20/54]
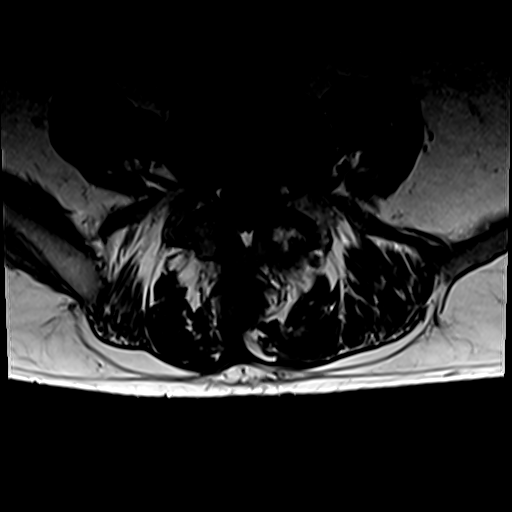
[im 27/54]
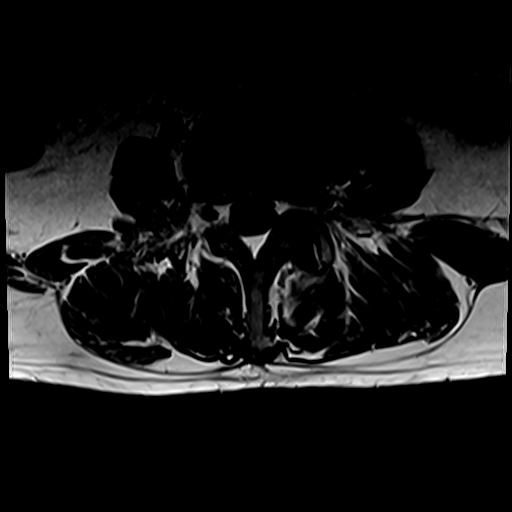
[im 34/54]
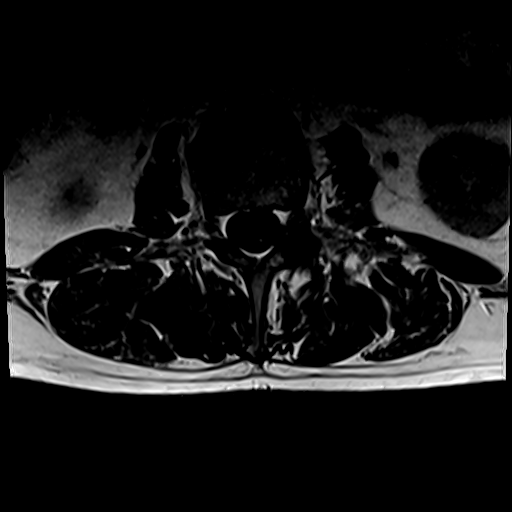
[im 40/54]
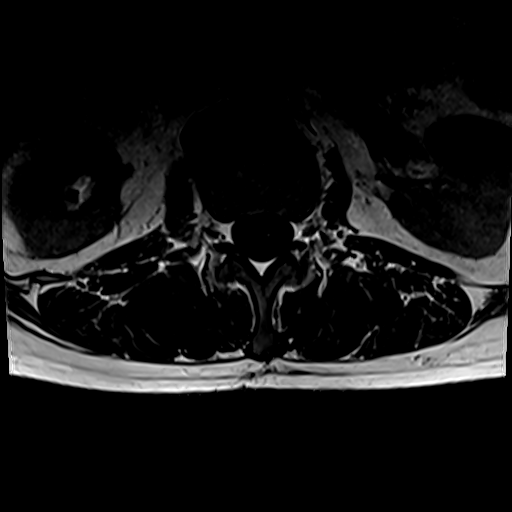
[im 47/54]
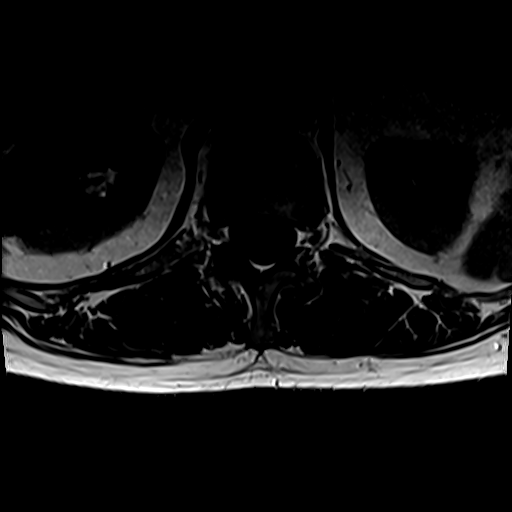
[im 54/54]
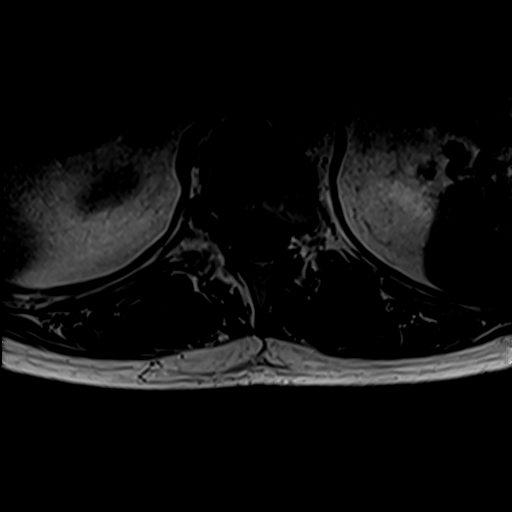

[27 of 48 positions shown; findings below may reference images not displayed]

FINDINGS: Segmentation:  Standard.

Alignment: Grade 1 anterolisthesis L5 on S1 secondary to L5
bilateral pars interarticularis defects.

Vertebrae:  No fracture, evidence of discitis, or bone lesion.

Conus medullaris and cauda equina: Conus extends to the L1 level.
Conus and cauda equina appear normal.

Paraspinal and other soft tissues: No acute paraspinal abnormality.

Disc levels:

Disc spaces: Degenerative disease with severe disc height loss at
L2-3 with reactive endplate changes. Mild degenerative disease with
disc height loss at L3-4 reactive endplate changes. Degenerative
disease with disc height loss at L5-S1. Disc desiccation at L4-5.

T12-L1: No significant disc bulge. No evidence of neural foraminal
stenosis. No central canal stenosis. Mild bilateral facet
arthropathy.

L1-L2: Minimal broad-based disc bulge. No evidence of neural
foraminal stenosis. No central canal stenosis.

L2-L3: Broad-based disc osteophyte complex with a small left
foraminal disc protrusion. Severe left and mild right facet
arthropathy. Moderate left foraminal stenosis. Mild right foraminal
stenosis. Mild spinal stenosis.

L3-L4: Broad-based disc bulge. Moderate bilateral facet arthropathy.
Moderate-severe left foraminal stenosis. No right foraminal
stenosis. Mild spinal stenosis.

L4-L5: Minimal broad-based disc bulge. Severe bilateral facet
arthropathy. Mild bilateral foraminal stenosis. No central canal
stenosis.

L5-S1: No significant disc bulge. Mild bilateral facet arthropathy.
Severe right foraminal stenosis. No left foraminal stenosis. No
central canal stenosis.
IMPRESSION: 1. Diffuse lumbar spine spondylosis as described above.
2.  No acute osseous injury of the lumbar spine.

## 2022-03-23 ENCOUNTER — Ambulatory Visit: Payer: Medicare PPO | Admitting: Physician Assistant

## 2022-03-24 ENCOUNTER — Ambulatory Visit (INDEPENDENT_AMBULATORY_CARE_PROVIDER_SITE_OTHER): Payer: Medicare PPO | Admitting: Urology

## 2022-03-24 DIAGNOSIS — C678 Malignant neoplasm of overlapping sites of bladder: Secondary | ICD-10-CM

## 2022-03-24 LAB — URINALYSIS, ROUTINE W REFLEX MICROSCOPIC
Bilirubin, UA: NEGATIVE
Glucose, UA: NEGATIVE
Ketones, UA: NEGATIVE
Leukocytes,UA: NEGATIVE
Nitrite, UA: NEGATIVE
Protein,UA: NEGATIVE
Specific Gravity, UA: 1.02 (ref 1.005–1.030)
Urobilinogen, Ur: 0.2 mg/dL (ref 0.2–1.0)
pH, UA: 5.5 (ref 5.0–7.5)

## 2022-03-24 MED ORDER — BCG LIVE 50 MG IS SUSR
3.2400 mL | Freq: Once | INTRAVESICAL | Status: AC
  Administered 2022-03-24: 81 mg via INTRAVESICAL

## 2022-03-24 NOTE — Progress Notes (Signed)
BCG Bladder Instillation  BCG # 1 of 3  Due to Bladder Cancer patient is present today for a BCG treatment. Patient was cleaned and prepped in a sterile fashion with betadine. A 14FR catheter was inserted, urine return was noted 65m, urine was yellow in color.  517mof reconstituted BCG was instilled into the bladder. The catheter was then removed. Patient tolerated well, no complications were noted  Performed by: Phinehas Grounds LPN  Follow up/ Additional notes: keep scheduled NV

## 2022-03-24 NOTE — Patient Instructions (Signed)

## 2022-03-30 ENCOUNTER — Ambulatory Visit (INDEPENDENT_AMBULATORY_CARE_PROVIDER_SITE_OTHER): Payer: Medicare PPO | Admitting: Urology

## 2022-03-30 DIAGNOSIS — C678 Malignant neoplasm of overlapping sites of bladder: Secondary | ICD-10-CM | POA: Diagnosis not present

## 2022-03-30 LAB — URINALYSIS, ROUTINE W REFLEX MICROSCOPIC
Bilirubin, UA: NEGATIVE
Glucose, UA: NEGATIVE
Ketones, UA: NEGATIVE
Leukocytes,UA: NEGATIVE
Nitrite, UA: NEGATIVE
Protein,UA: NEGATIVE
Specific Gravity, UA: 1.015 (ref 1.005–1.030)
Urobilinogen, Ur: 0.2 mg/dL (ref 0.2–1.0)
pH, UA: 5.5 (ref 5.0–7.5)

## 2022-03-30 LAB — MICROSCOPIC EXAMINATION

## 2022-03-30 MED ORDER — BCG LIVE 50 MG IS SUSR
3.2400 mL | Freq: Once | INTRAVESICAL | Status: AC
  Administered 2022-03-30: 81 mg via INTRAVESICAL

## 2022-03-30 NOTE — Progress Notes (Signed)
BCG Bladder Instillation  BCG # 2 of 3  Due to Bladder Cancer patient is present today for a BCG treatment. Patient was cleaned and prepped in a sterile fashion with betadine. A 14FR catheter was inserted, urine return was noted 87m, urine was YELLOW in color.  553mof reconstituted BCG was instilled into the bladder. The catheter was then removed. Patient tolerated well, no complications were noted  Performed by: Sharea Guinther LPN  Follow up/ Additional notes: keep scheduled NV

## 2022-03-30 NOTE — Patient Instructions (Signed)

## 2022-04-05 DIAGNOSIS — H01002 Unspecified blepharitis right lower eyelid: Secondary | ICD-10-CM | POA: Diagnosis not present

## 2022-04-05 DIAGNOSIS — H401131 Primary open-angle glaucoma, bilateral, mild stage: Secondary | ICD-10-CM | POA: Diagnosis not present

## 2022-04-05 DIAGNOSIS — H01001 Unspecified blepharitis right upper eyelid: Secondary | ICD-10-CM | POA: Diagnosis not present

## 2022-04-05 DIAGNOSIS — H43811 Vitreous degeneration, right eye: Secondary | ICD-10-CM | POA: Diagnosis not present

## 2022-04-06 ENCOUNTER — Ambulatory Visit (INDEPENDENT_AMBULATORY_CARE_PROVIDER_SITE_OTHER): Payer: Medicare PPO | Admitting: Urology

## 2022-04-06 DIAGNOSIS — C678 Malignant neoplasm of overlapping sites of bladder: Secondary | ICD-10-CM

## 2022-04-06 MED ORDER — BCG LIVE 50 MG IS SUSR
3.2400 mL | Freq: Once | INTRAVESICAL | Status: AC
  Administered 2022-04-06: 81 mg via INTRAVESICAL

## 2022-04-06 NOTE — Progress Notes (Signed)
BCG Bladder Instillation  BCG # 3 of 3  Due to Bladder Cancer patient is present today for a BCG treatment. Patient was cleaned and prepped in a sterile fashion with betadine. A 14FR catheter was inserted, urine return was noted 40m, urine was yellow in color.  574mof reconstituted BCG was instilled into the bladder. The catheter was then removed. Patient tolerated well, no complications were noted  Performed by: AmEstill BambergN  Follow up/ Additional notes: keep scheduled f/u

## 2022-04-06 NOTE — Patient Instructions (Signed)

## 2022-04-07 LAB — URINALYSIS, ROUTINE W REFLEX MICROSCOPIC
Bilirubin, UA: NEGATIVE
Glucose, UA: NEGATIVE
Ketones, UA: NEGATIVE
Leukocytes,UA: NEGATIVE
Nitrite, UA: NEGATIVE
Protein,UA: NEGATIVE
Specific Gravity, UA: 1.015 (ref 1.005–1.030)
Urobilinogen, Ur: 0.2 mg/dL (ref 0.2–1.0)
pH, UA: 5.5 (ref 5.0–7.5)

## 2022-05-30 NOTE — Progress Notes (Signed)
History of Present Illness:   ???? for followup of bladder cancer.   11.7.2022: TURBT-  Pathology-high-grade nonmuscle invasive bladder cancer.--6.5 cm posterior wall lesion.  Muscularis present in specimen.  He did not receive gemcitabine.   12.8.2022: Repeat TURBT--8 mm Lt anterior wall papillary lesion, repeat posterior bladder wall lesion resection. Both specimens revealed HG NMIBC. Gemcitabine administered.   2.8.2023: Induction BCG (6 rxs) completed.   3.21.2023: Here for follow-up with cystoscopy.    5.10.2023: Completed first BCG maintenance  6.27.2023: Here for surveillance cystoscopy.  Significant erythematous area in his trigonal region as well as 2-3 papillary lesions in his bladder dome.   7.13.2023: He underwent biopsy of his trigonal area as well as the papillary lesions. Path--Benign submucosa with chronic inflammation . He has a very thin bladder wall, and gemcitabine not administered.He did well w/ Bx. Despite negative pathology, he did have papillary recurrences @ dome of bladder.   8.28.2023: Completed 3 maintenance BCG Rxs.   10.10.2023:   Past Medical History:  Diagnosis Date   Abnormal liver function tests 05/03/2011   Arthritis    Bladder cancer ([place]) 02/2021   high grade nonmuscle invasive bladder cancer   BPH (benign prostatic hypertrophy) 05/03/2011   Elevated transaminase level    GERD (gastroesophageal reflux disease)    Hairy cell leukemia, in remission ([place]) 1992   remission since 2008   IBS (irritable bowel syndrome)    Irritable bowel syndrome    2 yrs ago, states he had inflammation around anus. Biopsy: inclusive   Lumbar foraminal stenosis 12/31/2019   Lumbar radiculopathy 11/15/2019    Past Surgical History:  Procedure Laterality Date   BACK SURGERY     BONE MARROW BIOPSY     2007 and 1992   CATARACT EXTRACTION W/PHACO Right 08/15/2020   Procedure: CATARACT EXTRACTION PHACO AND INTRAOCULAR LENS PLACEMENT RIGHT EYE;  Surgeon: Baruch Goldmann, MD;  Location: [facility];  Service: Ophthalmology;  Laterality: Right;  right CDE=7.75   COLONOSCOPY  04/19/2006   with polyps   COLONOSCOPY  09/30/2011   Procedure: COLONOSCOPY;  Surgeon: Rogene Houston, MD;  [facility];  Service: Endoscopy;  Laterality: N/A;  730   COLONOSCOPY N/A 01/11/2019   Procedure: COLONOSCOPY;  Surgeon: Rogene Houston, MD;  [facility];  Service: Endoscopy;  Laterality: N/A;  100-office move to 9:30am   CYSTOSCOPY W/ RETROGRADES Bilateral 02/23/2021   Procedure: CYSTOSCOPY WITH RETROGRADE PYELOGRAM;  Surgeon: Franchot Gallo, MD;  [facility]: [facility];  Service: Urology;  Laterality: Bilateral;   HX COLON POLYPS     TONSILLECTOMY     childhood   TRANSFORAMINAL LUMBAR INTERBODY FUSION W/ MIS 1 LEVEL Right 11/18/2020   Procedure: Right Lumbar Five-Sacral One Minimally invasive transforaminal lumbar interbody fusion;  Surgeon: Judith Part, MD;  Location: [place];  Service: Neurosurgery;  Laterality: Right;   TRANSURETHRAL RESECTION OF BLADDER TUMOR N/A 03/26/2021   Procedure: REPEAT TRANSURETHRAL RESECTION OF BLADDER TUMOR (TURBT)/ POST OPERATIVE INSTILLATION OF GEMCITABINE;  Surgeon: Franchot Gallo, MD;  Location: [facility];  Service: Urology;  Laterality: N/A;   TRANSURETHRAL RESECTION OF BLADDER TUMOR WITH MITOMYCIN-C N/A 02/23/2021   Procedure: TRANSURETHRAL RESECTION OF BLADDER TUMOR;  Surgeon: Franchot Gallo, MD;  Location: [facility];  Service: Urology;  Laterality: N/A;   TRANSURETHRAL RESECTION OF BLADDER TUMOR WITH MITOMYCIN-C N/A 10/29/2021   Procedure: TRANSURETHRAL RESECTION OF BLADDER TUMOR WITH GEMCITABINE;  Surgeon: Franchot Gallo, MD;  Location: [place];  Service: Urology;  Laterality: N/A;    Home Medications:  Allergies as of 06/01/2022       Reactions   Penicillins Swelling   Did it involve swelling of the face/tongue/throat,  SOB, or low BP? No Did it involve sudden or severe rash/hives, skin peeling, or any reaction on the inside of your mouth or nose? No Did you need to seek medical attention at a hospital or doctor's office? Was already inpatient at the time When did it last happen?      2005 If all above answers are "NO", may proceed with cephalosporin use.        Medication List        Accurate as of May 30, 2022  9:20 PM. If you have any questions, ask your nurse or doctor.          CHARCOAL ACTIVATED PO Take 520 mg by mouth as needed. Over week   Clear Eyes Seasonal Relief 0.012-0.2-0.25 % Soln Generic drug: Naphazoline-Glycerin-Zinc Sulf Place 1 drop into both eyes daily as needed (allergies).   diphenhydrAMINE 25 mg capsule Commonly known as: BENADRYL Take 25-50 mg by mouth every 6 (six) hours as needed for allergies or sleep.   finasteride 5 MG tablet Commonly known as: PROSCAR Take 1 tablet (5 mg total) by mouth every morning.   FISH OIL OMEGA-3 PO Take 1,000 mg by mouth daily. 300 mg omega 3. Stopped on 03/13/21 for 03/26/21 surgery.   GINKOBA PO Take 120 mg by mouth as needed. Stopped will restart after surgery   ibuprofen 200 MG tablet Commonly known as: ADVIL Take 400 mg by mouth every 6 (six) hours as needed (Inflammation).   phenazopyridine 200 MG tablet Commonly known as: Pyridium Take 1 tablet (200 mg total) by mouth 3 (three) times daily as needed for pain.   psyllium 58.6 % packet Commonly known as: METAMUCIL Take 1 packet by mouth as needed (constipation).   rosuvastatin 10 MG tablet Commonly known as: CRESTOR Take 10 mg by mouth at bedtime.   tamsulosin 0.4 MG Caps capsule Commonly known as: FLOMAX Take 1 capsule (0.4 mg total) by mouth at bedtime.        Allergies:  Allergies  Allergen Reactions   Penicillins Swelling    Did it involve swelling of the face/tongue/throat, SOB, or low BP? No Did it involve sudden or severe rash/hives, skin  peeling, or any reaction on the inside of your mouth or nose? No Did you need to seek medical attention at a hospital or doctor's office? Was already inpatient at the time When did it last happen?      2005 If all above answers are "NO", may proceed with cephalosporin use.     No family history on file.  Social History:  reports that he quit smoking about 16 years ago. His smoking use included cigarettes. He has never used smokeless tobacco. He reports current alcohol use of about 4.0 standard drinks of alcohol per week. He reports that he does not use drugs.  ROS: A complete review of systems was performed.  All systems are negative except for pertinent findings as noted.  Physical Exam:  Vital signs in last 24 hours: There were no vitals taken for this visit. Constitutional:  Alert and oriented, No acute distress Cardiovascular: Regular rate  Respiratory: Normal respiratory effort GI: Abdomen is soft, nontender, nondistended, no abdominal masses. No CVAT.  Genitourinary: Normal male phallus, testes are descended bilaterally and non-tender and  without masses, scrotum is normal in appearance without lesions or masses, perineum is normal on inspection. Lymphatic: No lymphadenopathy Neurologic: Grossly intact, no focal deficits Psychiatric: Normal mood and affect  I have reviewed prior pt notes  I have reviewed notes from referring/previous physicians  I have reviewed urinalysis results  I have independently reviewed prior imaging  I have reviewed prior PSA results  I have reviewed prior urine culture   Impression/Assessment:  ***  Plan:  ***

## 2022-06-01 ENCOUNTER — Ambulatory Visit (INDEPENDENT_AMBULATORY_CARE_PROVIDER_SITE_OTHER): Payer: Medicare HMO | Admitting: Urology

## 2022-06-01 VITALS — BP 137/82 | HR 80

## 2022-06-01 DIAGNOSIS — C678 Malignant neoplasm of overlapping sites of bladder: Secondary | ICD-10-CM

## 2022-06-01 DIAGNOSIS — N401 Enlarged prostate with lower urinary tract symptoms: Secondary | ICD-10-CM

## 2022-06-01 DIAGNOSIS — Z8551 Personal history of malignant neoplasm of bladder: Secondary | ICD-10-CM

## 2022-06-01 LAB — URINALYSIS, ROUTINE W REFLEX MICROSCOPIC
Bilirubin, UA: NEGATIVE
Glucose, UA: NEGATIVE
Ketones, UA: NEGATIVE
Leukocytes,UA: NEGATIVE
Nitrite, UA: NEGATIVE
Protein,UA: NEGATIVE
RBC, UA: NEGATIVE
Specific Gravity, UA: 1.01 (ref 1.005–1.030)
Urobilinogen, Ur: 0.2 mg/dL (ref 0.2–1.0)
pH, UA: 6 (ref 5.0–7.5)

## 2022-06-01 MED ORDER — CIPROFLOXACIN HCL 500 MG PO TABS
500.0000 mg | ORAL_TABLET | Freq: Once | ORAL | Status: AC
  Administered 2022-06-01: 500 mg via ORAL

## 2022-06-03 LAB — CYTOLOGY, URINE

## 2022-06-09 ENCOUNTER — Other Ambulatory Visit: Payer: Self-pay | Admitting: Urology

## 2022-06-11 ENCOUNTER — Telehealth: Payer: Self-pay

## 2022-06-11 NOTE — Telephone Encounter (Signed)
Patient called wanting to know if he needs to keep his scheduled BCG treatment's in March since he is scheduled for sx on 03/18.  He did mention that you were supposed to reach out to him personally so if you have already discussed this with the patient please disregard.  Please let me know if we need to r/s upcoming BCG treatments.

## 2022-06-18 NOTE — Telephone Encounter (Signed)
Called patient he is aware that he does not need to have BCG treatments.  Patient will f/u as scheduled.

## 2022-06-29 ENCOUNTER — Encounter (HOSPITAL_BASED_OUTPATIENT_CLINIC_OR_DEPARTMENT_OTHER): Payer: Self-pay | Admitting: Urology

## 2022-06-29 NOTE — Progress Notes (Signed)
Spoke w/ via phone for pre-op interview---[facility] Lab needs dos----  NONE             Lab results------ COVID test -----patient states asymptomatic no test needed Arrive at -------0800 NPO after MN NO Solid Food.   Med rec completed Medications to take morning of surgery -----Proscar Diabetic medication ----- Patient instructed no nail polish to be worn day of surgery Patient instructed to bring photo id and insurance card day of surgery Patient aware to have Driver (ride ) / caregiver  Wife Jarrett Am[place]bury  for 24 hours after surgery  Patient Special Instructions ----- Pre-Op special Istructions ----- Patient verbalized understanding of instructions that were given at this phone interview. Patient denies shortness of breath, chest pain, fever, cough at this phone interview.

## 2022-06-30 ENCOUNTER — Ambulatory Visit: Payer: Medicare HMO

## 2022-07-01 ENCOUNTER — Ambulatory Visit: Payer: Medicare HMO

## 2022-07-01 ENCOUNTER — Telehealth: Payer: Self-pay

## 2022-07-01 NOTE — Telephone Encounter (Signed)
Patient called a left voice message that he is having  dizziness, upset stomach and gas sxs for 2 weeks Return called to patient.. Patient states that he spoke with PCP and was told if sxs get worst to reach back out. Patient  states that sxs are not any worst but very irritating. Patient will like to know if it will be wised to cancel upcoming surgery schedule at [facility] or is there any suggestion you have for the patient?  Patient states that he wants to have the surgery but unsure if he have to have local anesthesia. Patient is aware that a task will be sent to the MD for advisement. Patient voiced understanding

## 2022-07-01 NOTE — Telephone Encounter (Signed)
Tried calling patient with no answer, left vm for return call 

## 2022-07-05 ENCOUNTER — Encounter (HOSPITAL_BASED_OUTPATIENT_CLINIC_OR_DEPARTMENT_OTHER): Payer: Self-pay | Admitting: Urology

## 2022-07-05 ENCOUNTER — Ambulatory Visit (HOSPITAL_BASED_OUTPATIENT_CLINIC_OR_DEPARTMENT_OTHER): Payer: Medicare HMO | Admitting: Anesthesiology

## 2022-07-05 ENCOUNTER — Ambulatory Visit (HOSPITAL_BASED_OUTPATIENT_CLINIC_OR_DEPARTMENT_OTHER)
Admission: RE | Admit: 2022-07-05 | Discharge: 2022-07-05 | Disposition: A | Discharge status: Home / Self Care | Payer: Medicare HMO | Attending: Urology | Admitting: Urology

## 2022-07-05 ENCOUNTER — Encounter (HOSPITAL_BASED_OUTPATIENT_CLINIC_OR_DEPARTMENT_OTHER)
Admission: RE | Disposition: A | Discharge status: Home / Self Care | Payer: Self-pay | Source: Home / Self Care | Attending: Urology

## 2022-07-05 ENCOUNTER — Other Ambulatory Visit: Payer: Self-pay

## 2022-07-05 DIAGNOSIS — M1990 Unspecified osteoarthritis, unspecified site: Secondary | ICD-10-CM | POA: Insufficient documentation

## 2022-07-05 DIAGNOSIS — C679 Malignant neoplasm of bladder, unspecified: Secondary | ICD-10-CM | POA: Diagnosis not present

## 2022-07-05 DIAGNOSIS — Z8551 Personal history of malignant neoplasm of bladder: Secondary | ICD-10-CM | POA: Diagnosis not present

## 2022-07-05 DIAGNOSIS — G709 Myoneural disorder, unspecified: Secondary | ICD-10-CM | POA: Diagnosis not present

## 2022-07-05 DIAGNOSIS — Z87891 Personal history of nicotine dependence: Secondary | ICD-10-CM | POA: Insufficient documentation

## 2022-07-05 DIAGNOSIS — N400 Enlarged prostate without lower urinary tract symptoms: Secondary | ICD-10-CM | POA: Diagnosis not present

## 2022-07-05 DIAGNOSIS — G4730 Sleep apnea, unspecified: Secondary | ICD-10-CM | POA: Diagnosis not present

## 2022-07-05 DIAGNOSIS — N3020 Other chronic cystitis without hematuria: Secondary | ICD-10-CM | POA: Diagnosis not present

## 2022-07-05 DIAGNOSIS — K589 Irritable bowel syndrome without diarrhea: Secondary | ICD-10-CM | POA: Diagnosis not present

## 2022-07-05 DIAGNOSIS — K219 Gastro-esophageal reflux disease without esophagitis: Secondary | ICD-10-CM | POA: Insufficient documentation

## 2022-07-05 DIAGNOSIS — N3289 Other specified disorders of bladder: Secondary | ICD-10-CM

## 2022-07-05 DIAGNOSIS — C678 Malignant neoplasm of overlapping sites of bladder: Secondary | ICD-10-CM

## 2022-07-05 HISTORY — PX: CYSTOSCOPY W/ RETROGRADES: SHX1426

## 2022-07-05 HISTORY — DX: Sleep apnea, unspecified: G47.30

## 2022-07-05 HISTORY — PX: CYSTOSCOPY WITH BIOPSY: SHX5122

## 2022-07-05 SURGERY — CYSTOSCOPY, WITH BIOPSY
Anesthesia: General | Site: Renal

## 2022-07-05 MED ORDER — ONDANSETRON HCL 4 MG/2ML IJ SOLN
INTRAMUSCULAR | Status: DC | PRN
  Administered 2022-07-05: 4 mg via INTRAVENOUS

## 2022-07-05 MED ORDER — PROPOFOL 10 MG/ML IV BOLUS
INTRAVENOUS | Status: AC
  Filled 2022-07-05: qty 20

## 2022-07-05 MED ORDER — DEXAMETHASONE SODIUM PHOSPHATE 10 MG/ML IJ SOLN
INTRAMUSCULAR | Status: AC
  Filled 2022-07-05: qty 1

## 2022-07-05 MED ORDER — LIDOCAINE HCL (PF) 2 % IJ SOLN
INTRAMUSCULAR | Status: AC
  Filled 2022-07-05: qty 5

## 2022-07-05 MED ORDER — DEXAMETHASONE SODIUM PHOSPHATE 10 MG/ML IJ SOLN
INTRAMUSCULAR | Status: DC | PRN
  Administered 2022-07-05: 5 mg via INTRAVENOUS

## 2022-07-05 MED ORDER — LIDOCAINE 2% (20 MG/ML) 5 ML SYRINGE
INTRAMUSCULAR | Status: DC | PRN
  Administered 2022-07-05: 40 mg via INTRAVENOUS

## 2022-07-05 MED ORDER — STERILE WATER FOR IRRIGATION IR SOLN
Status: DC | PRN
  Administered 2022-07-05: 3000 mL

## 2022-07-05 MED ORDER — CIPROFLOXACIN IN D5W 400 MG/200ML IV SOLN
INTRAVENOUS | Status: AC
  Filled 2022-07-05: qty 200

## 2022-07-05 MED ORDER — LACTATED RINGERS IV SOLN: INTRAVENOUS | Status: DC

## 2022-07-05 MED ORDER — AMISULPRIDE (ANTIEMETIC) 5 MG/2ML IV SOLN: 10.0000 mg | Freq: Once | INTRAVENOUS | Status: DC | PRN

## 2022-07-05 MED ORDER — IOHEXOL 300 MG/ML  SOLN
INTRAMUSCULAR | Status: DC | PRN
  Administered 2022-07-05: 18 mL via URETHRAL

## 2022-07-05 MED ORDER — WHITE PETROLATUM EX OINT
TOPICAL_OINTMENT | CUTANEOUS | Status: AC
  Filled 2022-07-05: qty 5

## 2022-07-05 MED ORDER — ONDANSETRON HCL 4 MG/2ML IJ SOLN
INTRAMUSCULAR | Status: AC
  Filled 2022-07-05: qty 2

## 2022-07-05 MED ORDER — STERILE WATER FOR IRRIGATION IR SOLN
Status: DC | PRN
  Administered 2022-07-05: 500 mL

## 2022-07-05 MED ORDER — PROPOFOL 10 MG/ML IV BOLUS
INTRAVENOUS | Status: DC | PRN
  Administered 2022-07-05: 150 mg via INTRAVENOUS
  Administered 2022-07-05: 50 mg via INTRAVENOUS
  Administered 2022-07-05: 100 mg via INTRAVENOUS

## 2022-07-05 MED ORDER — FENTANYL CITRATE (PF) 100 MCG/2ML IJ SOLN
INTRAMUSCULAR | Status: AC
  Filled 2022-07-05: qty 2

## 2022-07-05 MED ORDER — FENTANYL CITRATE (PF) 100 MCG/2ML IJ SOLN
INTRAMUSCULAR | Status: DC | PRN
  Administered 2022-07-05: 75 ug via INTRAVENOUS

## 2022-07-05 MED ORDER — FENTANYL CITRATE (PF) 100 MCG/2ML IJ SOLN
25.0000 ug | INTRAMUSCULAR | Status: DC | PRN
  Administered 2022-07-05: 25 ug via INTRAVENOUS

## 2022-07-05 SURGICAL SUPPLY — 33 items
BAG DRAIN URO-CYSTO SKYTR STRL (DRAIN) ×2 IMPLANT
BAG DRN RND TRDRP ANRFLXCHMBR (UROLOGICAL SUPPLIES) ×2
BAG DRN UROCATH (DRAIN) ×2
BAG URINE DRAIN 2000ML AR STRL (UROLOGICAL SUPPLIES) IMPLANT
BASKET ZERO TIP NITINOL 2.4FR (BASKET) IMPLANT
BLANKET WARM UPPER BOD BAIR (MISCELLANEOUS) ×2 IMPLANT
BSKT STON RTRVL ZERO TP 2.4FR (BASKET)
CATH FOLEY 2WAY SLVR 18FR 30CC (CATHETERS) IMPLANT
CATH URETL OPEN END 6FR 70 (CATHETERS) ×2 IMPLANT
CLOTH BEACON ORANGE TIMEOUT ST (SAFETY) ×2 IMPLANT
COVER DOME SNAP 22 D (MISCELLANEOUS) ×2 IMPLANT
ELECT REM PT RETURN 9FT ADLT (ELECTROSURGICAL) ×2
ELECTRODE REM PT RTRN 9FT ADLT (ELECTROSURGICAL) ×2 IMPLANT
GLOVE BIO SURGEON STRL SZ8 (GLOVE) ×2 IMPLANT
GOWN STRL REUS W/TWL XL LVL3 (GOWN DISPOSABLE) ×4 IMPLANT
GUIDEWIRE ANG ZIPWIRE 038X150 (WIRE) IMPLANT
GUIDEWIRE STR DUAL SENSOR (WIRE) IMPLANT
KIT TURNOVER CYSTO (KITS) ×2 IMPLANT
LASER FIB FLEXIVA PULSE ID 365 (Laser) IMPLANT
MANIFOLD NEPTUNE II (INSTRUMENTS) ×2 IMPLANT
NDL HYPO 22X1.5 SAFETY MO (MISCELLANEOUS) IMPLANT
NDL SAFETY ECLIP 18X1.5 (MISCELLANEOUS) IMPLANT
NEEDLE HYPO 22X1.5 SAFETY MO (MISCELLANEOUS) IMPLANT
NEEDLE SAFETY HYPO 22GAX1.5 (MISCELLANEOUS)
PACK CYSTO (CUSTOM PROCEDURE TRAY) ×2 IMPLANT
SHEATH NAVIGATOR HD 11/13X36 (SHEATH) IMPLANT
SLEEVE SCD COMPRESS KNEE MED (STOCKING) ×2 IMPLANT
SYR 20ML LL LF (SYRINGE) IMPLANT
TRACTIP FLEXIVA PULS ID 200XHI (Laser) IMPLANT
TRACTIP FLEXIVA PULSE ID 200 (Laser)
TUBE CONNECTING 12X1/4 (SUCTIONS) IMPLANT
TUBING UROLOGY SET (TUBING) IMPLANT
WATER STERILE IRR 3000ML UROMA (IV SOLUTION) ×2 IMPLANT

## 2022-07-05 NOTE — Transfer of Care (Signed)
Immediate Anesthesia Transfer of Care Note  Patient: Cameron Wu  Procedure(s) Performed: Procedure(s) (LRB): CYSTOSCOPY WITH BIOPSY (N/A) CYSTOSCOPY WITH RETROGRADE PYELOGRAM (Bilateral)  Patient Location: PACU  Anesthesia Type: General  Level of Consciousness: awake, oriented, sedated and patient cooperative  Airway & Oxygen Therapy: Patient Spontanous Breathing and Patient connected to face mask oxygen  Post-op Assessment: Report given to PACU RN and Post -op Vital signs reviewed and stable  Post vital signs: Reviewed and stable  Complications: No apparent anesthesia complications Last Vitals:  Vitals Value Taken Time  BP 130/60 07/05/22 1045  Temp 36.4 C 07/05/22 1033  Pulse 54 07/05/22 1051  Resp 17 07/05/22 1051  SpO2 97 % 07/05/22 1051  Vitals shown include unvalidated device data.  Last Pain:  Vitals:   07/05/22 1033  TempSrc:   PainSc: 0-No pain      Patients Stated Pain Goal: 4 ([id number])  Complications: No notable events documented.

## 2022-07-05 NOTE — Op Note (Signed)
Preoperative diagnosis: History of high-grade nonmuscle invasive bladder cancer with probable recurrence  Postoperative diagnosis: Same  Principal procedure: Cystoscopy, bilateral retrograde ureteropyelogram's, fluoroscopic interpretation, bladder biopsies  Surgeon: Shykeria Sakamoto  Anesthesia: General with LMA  Complications: None  Specimen: Bladder biopsies, to pathology  Estimated blood loss: Less than 5 mL  Indications: 72 year old male with history of high-grade nonmuscle invasive bladder cancer.  Initial resection was in November 2022.  He underwent repeat biopsy of recurrences in his trigone in July 2023.  He has gone through 2 maintenance BCG cycles, recent cystoscopy in February revealed positive cytologies.  Only small erythematous area was seen in the posterior bladder wall.  However, because of positive cytologies he presents at this time for bladder biopsies and retrogrades.  Findings: Urethra was normal without stricture or lesion.  Prostate was nonobstructive.  Inspection of the bladder revealed no papillary lesions.  Very slightly erythematous area in the posterior midline bladder.  This was perhaps 6 mm wide by 15 mm long.  No other significant erythematous areas were seen, multiple scars were seen from prior resections.  Ureteral orifices were normal.  Retrograde study of the right side using Omnipaque revealed a narrow ureter throughout without filling defect.  Pyelocalyceal system was normal.  On the left, slightly dilated distal ureter consistent with possible mild ureterocele.  Otherwise, ureter was normal throughout.  Pyelocalyceal system normal.  Description of procedure: Patient properly identified in the holding area.  Was taken to the operating room where general anesthetic was administered with the LMA.  He is placed in the dorsolithotomy position.  Genitalia and perineum were prepped, draped, proper timeout performed.  21 [place] panendoscope advanced under direct vision  using the Foroblique lens.  Visual inspection was performed of the whole bladder with the above-mentioned findings noted.  Cold cup biopsy forceps were used to biopsy the erythematous area superficially.  These were sent to pathology labeled "bladder biopsies".  All abnormal urothelium was removed.  Following this, Bugbee electrode was used to cauterize small bleeders.  There was adequate hemostasis.  At this point, using a 6 [place] open-ended catheter and Omnipaque, bilateral retrograde ureteropyelogram was performed.  Above-mentioned findings noted.  At this point, there being no bleeding and no other abnormalities to biopsy, the scope was removed.  I placed an 19 French Foley catheter.  Hooked to dependent drainage.  At this point the procedure was terminated.  The patient was awakened, taken to the PACU in stable condition having tolerated procedure well.

## 2022-07-05 NOTE — Anesthesia Procedure Notes (Signed)
Procedure Name: LMA Insertion Date/Time: 07/05/2022 9:56 AM  Performed by: Suan Halter, CRNAPre-anesthesia Checklist: Patient identified, Emergency Drugs available, Suction available and Patient being monitored Patient Re-evaluated:Patient Re-evaluated prior to induction Oxygen Delivery Method: Circle system utilized Preoxygenation: Pre-oxygenation with 100% oxygen Induction Type: IV induction Ventilation: Mask ventilation without difficulty LMA: LMA inserted LMA Size: 4.0 Number of attempts: 1 Airway Equipment and Method: Bite block Placement Confirmation: positive ETCO2 Tube secured with: Tape Dental Injury: Teeth and Oropharynx as per pre-operative assessment

## 2022-07-05 NOTE — Interval H&P Note (Signed)
History and Physical Interval Note:  07/05/2022 9:44 AM  Cameron Wu  has presented today for surgery, with the diagnosis of BLADDER CANCER.  The various methods of treatment have been discussed with the patient and family. After consideration of risks, benefits and other options for treatment, the patient has consented to  Procedure(s) with comments: CYSTOSCOPY WITH BIOPSY (N/A) - 30 MINS CYSTOSCOPY WITH RETROGRADE PYELOGRAM (Bilateral) as a surgical intervention.  The patient's history has been reviewed, patient examined, no change in status, stable for surgery.  I have reviewed the patient's chart and labs.  Questions were answered to the patient's satisfaction.     Lillette Boxer Kahner Yanik

## 2022-07-05 NOTE — Discharge Instructions (Addendum)
You may see some blood in the urine and may have some burning with urination for 48-72 hours. You also may notice that you have to urinate more frequently or urgently after your procedure which is normal.  You should call should you develop an inability urinate, fever > 101, persistent nausea and vomiting that prevents you from eating or drinking to stay hydrated.  If you have a catheter, you will be taught how to take care of the catheter by the nursing staff prior to discharge from the hospital.  You may periodically feel a strong urge to void with the catheter in place.  This is a bladder spasm and most often can occur when having a bowel movement or moving around. It is typically self-limited and usually will stop after a few minutes.  You may use some Vaseline or Neosporin around the tip of the catheter to reduce friction at the tip of the penis. You may also see some blood in the urine.  A very small amount of blood can make the urine look quite red.  As long as the catheter is draining well, there usually is not a problem.  However, if the catheter is not draining well and is bloody, you should call the office [phone]) to notify us.  It is okay to remove the catheter on Tuesday morning as instructed by nurses.   Post Anesthesia Home Care Instructions  Activity: Get plenty of rest for the remainder of the day. A responsible individual must stay with you for 24 hours following the procedure.  For the next 24 hours, DO NOT: -Drive a car -Paediatric nurse -Drink alcoholic beverages -Take any medication unless instructed by your physician -Make any legal decisions or sign important papers.  Meals: Start with liquid foods such as gelatin or soup. Progress to regular foods as tolerated. Avoid greasy, spicy, heavy foods. If nausea and/or vomiting occur, drink only clear liquids until the nausea and/or vomiting subsides. Call your physician if vomiting continues.  Special  Instructions/Symptoms: Your throat may feel dry or sore from the anesthesia or the breathing tube placed in your throat during surgery. If this causes discomfort, gargle with warm salt water. The discomfort should disappear within 24 hours.       CYSTOSCOPY HOME CARE INSTRUCTIONS  Activity: Rest for the remainder of the day.  Do not drive or operate equipment today.  You may resume normal activities in one to two days as instructed by your physician.   Meals: Drink plenty of liquids and eat light foods such as gelatin or soup this evening.  You may return to a normal meal plan tomorrow.  Return to Work: You may return to work in one to two days or as instructed by your physician.  Special Instructions / Symptoms: Call your physician if any of these symptoms occur:   -persistent or heavy bleeding  -bleeding which continues after first few urination  -large blood clots that are difficult to pass  -urine stream diminishes or stops completely  -fever equal to or higher than 101 degrees Farenheit.  -cloudy urine with a strong, foul odor  -severe pain  Females should always wipe from front to back after elimination.  You may feel some burning pain when you urinate.  This should disappear with time.  Applying moist heat to the lower abdomen or a hot tub bath may help relieve the pain.

## 2022-07-05 NOTE — Anesthesia Preprocedure Evaluation (Signed)
Anesthesia Evaluation  Patient identified by MRN, date of birth, ID band Patient awake    Reviewed: Allergy & Precautions, NPO status , Patient's Chart, lab work & pertinent test results  Airway Mallampati: II  TM Distance: >3 FB Neck ROM: Full    Dental  (+) Dental Advisory Given   Pulmonary sleep apnea , former smoker   breath sounds clear to auscultation       Cardiovascular negative cardio ROS  Rhythm:Regular Rate:Normal     Neuro/Psych  Neuromuscular disease    GI/Hepatic Neg liver ROS,GERD  ,,  Endo/Other  negative endocrine ROS    Renal/GU negative Renal ROS     Musculoskeletal  (+) Arthritis ,    Abdominal   Peds  Hematology negative hematology ROS (+)   Anesthesia Other Findings   Reproductive/Obstetrics                             Anesthesia Physical Anesthesia Plan  ASA: 2  Anesthesia Plan: General   Post-op Pain Management: Tylenol PO (pre-op)*   Induction: Intravenous  PONV Risk Score and Plan: 2 and Dexamethasone, Ondansetron and Treatment may vary due to age or medical condition  Airway Management Planned: LMA  Additional Equipment:   Intra-op Plan:   Post-operative Plan: Extubation in OR  Informed Consent: I have reviewed the patients History and Physical, chart, labs and discussed the procedure including the risks, benefits and alternatives for the proposed anesthesia with the patient or authorized representative who has indicated his/her understanding and acceptance.     Dental advisory given  Plan Discussed with: CRNA  Anesthesia Plan Comments:        Anesthesia Quick Evaluation

## 2022-07-05 NOTE — H&P (Signed)
H&P  Chief Complaint: Bladder cancer  History of Present Illness: Cameron Wu returns for cysto, (B) RGPs as well as bladder bx for h/o HG NMIBC w/ recent + cytologies. His GU hx is as follows:   11.7.2022: TURBT-  Pathology-high-grade nonmuscle invasive bladder cancer.--6.5 cm posterior wall lesion.  Muscularis present in specimen.  He did not receive gemcitabine.   12.8.2022: Repeat TURBT--8 mm Lt anterior wall papillary lesion, repeat posterior bladder wall lesion resection. Both specimens revealed HG NMIBC. Gemcitabine administered.   2.8.2023: Induction BCG (6 rxs) completed.   3.21.2023: Here for follow-up with cystoscopy.    5.10.2023: Completed first BCG maintenance   6.27.2023: Here for surveillance cystoscopy.  Significant erythematous area in his trigonal region as well as 2-3 papillary lesions in his bladder dome.   7.13.2023: He underwent biopsy of his trigonal area as well as the papillary lesions. Path--Benign submucosa with chronic inflammation . He has a very thin bladder wall, and gemcitabine not administered.He did well w/ Bx. Despite negative pathology, he did have papillary recurrences @ dome of bladder.   8.28.2023: Completed 3 maintenance BCG Rxs.   10.10.2023: Cystoscopy was negative.  Cytology inconclusive.   12.19.2023: Completed third round of maintenance BCG   2.13.2024: Cysto basically nml but cytologies +  Past Medical History:  Diagnosis Date   Abnormal liver function tests 05/03/2011   Arthritis    Bladder cancer ([place]) 02/2021   high grade nonmuscle invasive bladder cancer   BPH (benign prostatic hypertrophy) 05/03/2011   Elevated transaminase level    GERD (gastroesophageal reflux disease)    Hairy cell leukemia, in remission ([place]) 1992   remission since 2008   IBS (irritable bowel syndrome)    Irritable bowel syndrome    2 yrs ago, states he had inflammation around anus. Biopsy: inclusive   Lumbar foraminal stenosis 12/31/2019   Lumbar  radiculopathy 11/15/2019   Sleep apnea     Past Surgical History:  Procedure Laterality Date   BACK SURGERY     BONE MARROW BIOPSY     2007 and 1992   CATARACT EXTRACTION W/PHACO Right 08/15/2020   Procedure: CATARACT EXTRACTION PHACO AND INTRAOCULAR LENS PLACEMENT RIGHT EYE;  Surgeon: Baruch Goldmann, MD;  Location: [facility];  Service: Ophthalmology;  Laterality: Right;  right CDE=7.75   COLONOSCOPY  04/19/2006   with polyps   COLONOSCOPY  09/30/2011   Procedure: COLONOSCOPY;  Surgeon: Rogene Houston, MD;  Location: [facility];  Service: Endoscopy;  Laterality: N/A;  730   COLONOSCOPY N/A 01/11/2019   Procedure: COLONOSCOPY;  Surgeon: Rogene Houston, MD;  Location: [facility];  Service: Endoscopy;  Laterality: N/A;  100-office move to 9:30am   CYSTOSCOPY W/ RETROGRADES Bilateral 02/23/2021   Procedure: CYSTOSCOPY WITH RETROGRADE PYELOGRAM;  Surgeon: Franchot Gallo, MD;  Location: [facility];  Service: Urology;  Laterality: Bilateral;   HX COLON POLYPS     TONSILLECTOMY     childhood   TRANSFORAMINAL LUMBAR INTERBODY FUSION W/ MIS 1 LEVEL Right 11/18/2020   Procedure: Right Lumbar Five-Sacral One Minimally invasive transforaminal lumbar interbody fusion;  Surgeon: Judith Part, MD;  Location: [facility];  Service: Neurosurgery;  Laterality: Right;   TRANSURETHRAL RESECTION OF BLADDER TUMOR N/A 03/26/2021   Procedure: REPEAT TRANSURETHRAL RESECTION OF BLADDER TUMOR (TURBT)/ POST OPERATIVE INSTILLATION OF GEMCITABINE;  Surgeon: Franchot Gallo, MD;  Location: [facility];  Service: Urology;  Laterality: N/A;   TRANSURETHRAL RESECTION OF BLADDER TUMOR WITH MITOMYCIN-C N/A 02/23/2021  Procedure: TRANSURETHRAL RESECTION OF BLADDER TUMOR;  Surgeon: Franchot Gallo, MD;  Location: [facility];  Service: Urology;  Laterality: N/A;   TRANSURETHRAL RESECTION OF BLADDER TUMOR WITH MITOMYCIN-C N/A 10/29/2021   Procedure: TRANSURETHRAL  RESECTION OF BLADDER TUMOR WITH GEMCITABINE;  Surgeon: Franchot Gallo, MD;  Location: [facility];  Service: Urology;  Laterality: N/A;    Home Medications:    Allergies:  Allergies  Allergen Reactions   Penicillins Swelling    Did it involve swelling of the face/tongue/throat, SOB, or low BP? No Did it involve sudden or severe rash/hives, skin peeling, or any reaction on the inside of your mouth or nose? No Did you need to seek medical attention at a hospital or doctor's office? Was already inpatient at the time When did it last happen?      2005 If all above answers are "NO", may proceed with cephalosporin use.     History reviewed. No pertinent family history.  Social History:  reports that he quit smoking about 16 years ago. His smoking use included cigarettes. He has never used smokeless tobacco. He reports current alcohol use of about 4.0 standard drinks of alcohol per week. He reports that he does not use drugs.  ROS: A complete review of systems was performed.  All systems are negative except for pertinent findings as noted.  Physical Exam:  Vital signs in last 24 hours: Ht 6' (1.829 m)   Wt 79.8 kg   BMI 23.87 kg/m  Constitutional:  Alert and oriented, No acute distress Cardiovascular: Regular rate  Respiratory: Normal respiratory effort Neurologic: Grossly intact, no focal deficits Psychiatric: Normal mood and affect  I have reviewed prior pt notes  I have reviewed urinalysis results  I have independently reviewed prior imaging   Impression/Assessment:  History of high-grade nonmuscle invasive bladder cancer, BCG resistant with recent positive cytologies  Plan:  Cystoscopy, bilateral retrograde ureteropyelogram's, bladder biopsy

## 2022-07-06 ENCOUNTER — Encounter (HOSPITAL_BASED_OUTPATIENT_CLINIC_OR_DEPARTMENT_OTHER): Payer: Self-pay | Admitting: Urology

## 2022-07-06 LAB — SURGICAL PATHOLOGY

## 2022-07-06 NOTE — Anesthesia Postprocedure Evaluation (Signed)
Anesthesia Post Note  Patient: KRISTOFFER SKOLD  Procedure(s) Performed: CYSTOSCOPY WITH BIOPSY (Bladder) CYSTOSCOPY WITH RETROGRADE PYELOGRAM (Bilateral: Renal)     Patient location during evaluation: PACU Anesthesia Type: General Level of consciousness: awake and alert Pain management: pain level controlled Vital Signs Assessment: post-procedure vital signs reviewed and stable Respiratory status: spontaneous breathing, nonlabored ventilation, respiratory function stable and patient connected to nasal cannula oxygen Cardiovascular status: blood pressure returned to baseline and stable Postop Assessment: no apparent nausea or vomiting Anesthetic complications: no   No notable events documented.  Last Vitals:  Vitals:   07/05/22 1100 07/05/22 1145  BP: 124/61 130/71  Pulse: (!) 54 61  Resp: 12 16  Temp:  (!) 36.4 C  SpO2: 92% 99%    Last Pain:  Vitals:   07/06/22 1020  TempSrc:   PainSc: 0-No pain                 Tiajuana Amass

## 2022-07-07 ENCOUNTER — Ambulatory Visit: Payer: Medicare HMO

## 2022-07-14 ENCOUNTER — Ambulatory Visit: Payer: Medicare HMO

## 2022-07-15 ENCOUNTER — Ambulatory Visit: Payer: Medicare HMO

## 2022-07-26 NOTE — Progress Notes (Signed)
History of Present Illness: Cameron Wu is here for followup of bladder cancer.   11.7.2022: TURBT-  Pathology-high-grade nonmuscle invasive bladder cancer.--6.5 cm posterior wall lesion.  Muscularis present in specimen.  He did not receive gemcitabine.   12.8.2022: Repeat TURBT--8 mm Lt anterior wall papillary lesion, repeat posterior bladder wall lesion resection. Both specimens revealed HG NMIBC. Gemcitabine administered.   2.8.2023: Induction BCG (6 rxs) completed.   3.21.2023: Here for follow-up with cystoscopy.    5.10.2023: Completed first BCG maintenance   6.27.2023: Here for surveillance cystoscopy.  Significant erythematous area in his trigonal region as well as 2-3 papillary lesions in his bladder dome.   7.13.2023: He underwent biopsy of his trigonal area as well as the papillary lesions. Path--Benign submucosa with chronic inflammation . He has a very thin bladder wall, and gemcitabine not administered.He did well w/ Bx. Despite negative pathology, he did have papillary recurrences @ dome of bladder.   8.28.2023: Completed 3 maintenance BCG Rxs.   10.10.2023: Cystoscopy was negative.  Cytology inconclusive.   12.19.2023: Completed third round of maintenance BCG   2.13.2024: Cysto negative, cytology + for HG urothelial carcinoma.  3.18.2024: Cysto, bladder biopsies, (B) RGPs. RGPs nml, path--Benign urothelium and submucosa  With chronic inflammation and reactive changes   4.9.2024: Since his biopsy he lost his sense of taste, has bloating, increased gas and diarrhea.  No fever, no chills.  No real abdominal pain.  Does have a little bit worse lower urinary tract symptoms. IPSS 14, quality-of-life score 2.  He is on Flomax and finasteride.  Past Medical History:  Diagnosis Date   Abnormal liver function tests 05/03/2011   Arthritis    Bladder cancer (HCC) 02/2021   high grade nonmuscle invasive bladder cancer   BPH (benign prostatic hypertrophy) 05/03/2011   Elevated  transaminase level    GERD (gastroesophageal reflux disease)    Hairy cell leukemia, in remission (HCC) 1992   remission since 2008   IBS (irritable bowel syndrome)    Irritable bowel syndrome    2 yrs ago, states he had inflammation around anus. Biopsy: inclusive   Lumbar foraminal stenosis 12/31/2019   Lumbar radiculopathy 11/15/2019   Sleep apnea     Past Surgical History:  Procedure Laterality Date   BACK SURGERY     BONE MARROW BIOPSY     2007 and 1992   CATARACT EXTRACTION W/PHACO Right 08/15/2020   Procedure: CATARACT EXTRACTION PHACO AND INTRAOCULAR LENS PLACEMENT RIGHT EYE;  Surgeon: Fabio Pierce, MD;  Location: [facility];  Service: Ophthalmology;  Laterality: Right;  right CDE=7.75   COLONOSCOPY  04/19/2006   with polyps   COLONOSCOPY  09/30/2011   Procedure: COLONOSCOPY;  Surgeon: Malissa Hippo, MD;  [facility]: [facility];  Service: Endoscopy;  Laterality: N/A;  730   COLONOSCOPY N/A 01/11/2019   Procedure: COLONOSCOPY;  Surgeon: Malissa Hippo, MD;  [facility]: [facility];  Service: Endoscopy;  Laterality: N/A;  100-office move to 9:30am   CYSTOSCOPY W/ RETROGRADES Bilateral 02/23/2021   Procedure: CYSTOSCOPY WITH RETROGRADE PYELOGRAM;  Surgeon: Marcine Matar, MD;  Location: [facility];  Service: Urology;  Laterality: Bilateral;   CYSTOSCOPY W/ RETROGRADES Bilateral 07/05/2022   Procedure: CYSTOSCOPY WITH RETROGRADE PYELOGRAM;  Surgeon: Marcine Matar, MD;  Location: [facility];  Service: Urology;  Laterality: Bilateral;   CYSTOSCOPY WITH BIOPSY N/A 07/05/2022   Procedure: CYSTOSCOPY WITH BIOPSY;  Surgeon: Marcine Matar, MD;  Location: [facility];  Service: Urology;  Laterality: N/A;  30 MINS   HX COLON POLYPS     TONSILLECTOMY     childhood   TRANSFORAMINAL LUMBAR INTERBODY FUSION W/ MIS 1 LEVEL Right 11/18/2020   Procedure: Right Lumbar Five-Sacral One Minimally invasive transforaminal lumbar interbody  fusion;  Surgeon: Jadene Pierinistergard, Thomas A, MD;  Location: [facility];  Service: Neurosurgery;  Laterality: Right;   TRANSURETHRAL RESECTION OF BLADDER TUMOR N/A 03/26/2021   Procedure: REPEAT TRANSURETHRAL RESECTION OF BLADDER TUMOR (TURBT)/ POST OPERATIVE INSTILLATION OF GEMCITABINE;  Surgeon: Marcine Matarahlstedt, Jazziel Fitzsimmons, MD;  [facility] [place];  Service: Urology;  Laterality: N/A;   TRANSURETHRAL RESECTION OF BLADDER TUMOR WITH MITOMYCIN-C N/A 02/23/2021   Procedure: TRANSURETHRAL RESECTION OF BLADDER TUMOR;  Surgeon: Marcine Matarahlstedt, Arpi Diebold, MD;  [facility] [place];  Service: Urology;  Laterality: N/A;   TRANSURETHRAL RESECTION OF BLADDER TUMOR WITH MITOMYCIN-C N/A 10/29/2021   Procedure: TRANSURETHRAL RESECTION OF BLADDER TUMOR WITH GEMCITABINE;  Surgeon: Marcine Matarahlstedt, Amyriah Buras, MD;  [facility] [place];  Service: Urology;  Laterality: N/A;    Home Medications:  Allergies as of 07/27/2022       Reactions   Penicillins Swelling   Did it involve swelling of the face/tongue/throat, SOB, or low BP? No Did it involve sudden or severe rash/hives, skin peeling, or any reaction on the inside of your mouth or nose? No Did you need to seek medical attention at a hospital or doctor's office? Was already inpatient at the time When did it last happen?      2005 If all above answers are "NO", may proceed with cephalosporin use.        Medication List        Accurate as of July 26, 2022  8:13 PM. If you have any questions, ask your nurse or doctor.          CHARCOAL ACTIVATED PO Take 520 mg by mouth as needed. Over week   Clear Eyes Seasonal Relief 0.012-0.2-0.25 % Soln Generic drug: Naphazoline-Glycerin-Zinc Sulf Place 1 drop into both eyes daily as needed (allergies).   diphenhydrAMINE 25 mg capsule Commonly known as: BENADRYL Take 25-50 mg by mouth every 6 (six) hours as needed for allergies or sleep.   donepezil 10 MG tablet Commonly known as:  ARICEPT Take 5 mg by mouth daily.   finasteride 5 MG tablet Commonly known as: PROSCAR Take 1 tablet (5 mg total) by mouth every morning.   FISH OIL OMEGA-3 PO Take 1,000 mg by mouth daily. 300 mg omega 3. Stopped on 03/13/21 for 03/26/21 surgery.   GINKOBA PO Take 120 mg by mouth as needed. Stopped will restart after surgery   ibuprofen 200 MG tablet Commonly known as: ADVIL Take 400 mg by mouth every 6 (six) hours as needed (Inflammation).   phenazopyridine 200 MG tablet Commonly known as: Pyridium Take 1 tablet (200 mg total) by mouth 3 (three) times daily as needed for pain.   psyllium 58.6 % packet Commonly known as: METAMUCIL Take 1 packet by mouth as needed (constipation).   rosuvastatin 10 MG tablet Commonly known as: CRESTOR Take 10 mg by mouth at bedtime.   tamsulosin 0.4 MG Caps capsule Commonly known as: FLOMAX Take 1 capsule (0.4 mg total) by mouth at bedtime.        Allergies:  Allergies  Allergen Reactions   Penicillins Swelling    Did it involve swelling of the face/tongue/throat, SOB, or low BP? No Did it involve sudden or severe rash/hives, skin peeling, or any  reaction on the inside of your mouth or nose? No Did you need to seek medical attention at a hospital or doctor's office? Was already inpatient at the time When did it last happen?      2005 If all above answers are "NO", may proceed with cephalosporin use.     No family history on file.  Social History:  reports that he quit smoking about 16 years ago. His smoking use included cigarettes. He has never used smokeless tobacco. He reports current alcohol use of about 4.0 standard drinks of alcohol per week. He reports that he does not use drugs.  ROS: A complete review of systems was performed.  All systems are negative except for pertinent findings as noted.  Physical Exam:  Vital signs in last 24 hours: There were no vitals taken for this visit. Constitutional:  Alert and oriented, No  acute distress Cardiovascular: Regular rate  Respiratory: Normal respiratory effort GI: Abdomen is soft, nontender, nondistended, no abdominal masses. No CVAT.  Genitourinary: Normal male phallus, testes are descended bilaterally and non-tender and without masses, scrotum is normal in appearance without lesions or masses, perineum is normal on inspection. Lymphatic: No lymphadenopathy Neurologic: Grossly intact, no focal deficits Psychiatric: Normal mood and affect  I have reviewed prior pt notes  I have reviewed notes from referring/previous physicians  I have reviewed urinalysis results--clear  IPSS sheet reviewed  Pathology reviewed  I have independently reviewed prior imaging    Impression/Assessment:  High-grade nonmuscle invasive bladder cancer.  Worry for recent recurrence with positive cytologies, retrogrades and biopsies negative  Abdominal discomfort, constitutional symptoms following his most recent biopsy  Plan:  1.  I will have CBC and basic metabolic panel drawn today  2.  I will have him come back in a couple of weeks to recheck his symptoms.  If better, resume BCG maintenance

## 2022-07-27 ENCOUNTER — Ambulatory Visit (INDEPENDENT_AMBULATORY_CARE_PROVIDER_SITE_OTHER): Payer: Medicare HMO | Admitting: Urology

## 2022-07-27 VITALS — BP 129/73 | HR 81

## 2022-07-27 DIAGNOSIS — C678 Malignant neoplasm of overlapping sites of bladder: Secondary | ICD-10-CM | POA: Diagnosis not present

## 2022-07-27 LAB — URINALYSIS, ROUTINE W REFLEX MICROSCOPIC
Bilirubin, UA: NEGATIVE
Glucose, UA: NEGATIVE
Ketones, UA: NEGATIVE
Leukocytes,UA: NEGATIVE
Nitrite, UA: NEGATIVE
Protein,UA: NEGATIVE
RBC, UA: NEGATIVE
Specific Gravity, UA: 1.01 (ref 1.005–1.030)
Urobilinogen, Ur: 0.2 mg/dL (ref 0.2–1.0)
pH, UA: 5.5 (ref 5.0–7.5)

## 2022-07-28 LAB — CBC
Hematocrit: 38.6 % (ref 37.5–51.0)
Hemoglobin: 13.2 g/dL (ref 13.0–17.7)
MCH: 31.3 pg (ref 26.6–33.0)
MCHC: 34.2 g/dL (ref 31.5–35.7)
MCV: 92 fL (ref 79–97)
Platelets: 144 10*3/uL — ABNORMAL LOW (ref 150–450)
RBC: 4.22 x10E6/uL (ref 4.14–5.80)
RDW: 12.8 % (ref 11.6–15.4)
WBC: 5.1 10*3/uL (ref 3.4–10.8)

## 2022-07-28 LAB — BASIC METABOLIC PANEL
BUN/Creatinine Ratio: 12 (ref 10–24)
BUN: 12 mg/dL (ref 8–27)
CO2: 24 mmol/L (ref 20–29)
Calcium: 9.2 mg/dL (ref 8.6–10.2)
Chloride: 103 mmol/L (ref 96–106)
Creatinine, Ser: 1.01 mg/dL (ref 0.76–1.27)
Glucose: 116 mg/dL — ABNORMAL HIGH (ref 70–99)
Potassium: 3.8 mmol/L (ref 3.5–5.2)
Sodium: 144 mmol/L (ref 134–144)
eGFR: 80 mL/min/{1.73_m2} (ref >59)

## 2022-07-29 ENCOUNTER — Telehealth: Payer: Self-pay | Admitting: Hematology

## 2022-08-09 NOTE — Progress Notes (Signed)
History of Present Illness:  Cameron Wu is here for followup of bladder cancer.   11.7.2022: TURBT-  Pathology-high-grade nonmuscle invasive bladder cancer.--6.5 cm posterior wall lesion.  Muscularis present in specimen.  He did not receive gemcitabine.   12.8.2022: Repeat TURBT--8 mm Lt anterior wall papillary lesion, repeat posterior bladder wall lesion resection. Both specimens revealed HG NMIBC. Gemcitabine administered.   2.8.2023: Induction BCG (6 rxs) completed.   3.21.2023: Here for follow-up with cystoscopy.    5.10.2023: Completed first BCG maintenance   6.27.2023: Here for surveillance cystoscopy.  Significant erythematous area in his trigonal region as well as 2-3 papillary lesions in his bladder dome.   7.13.2023: He underwent biopsy of his trigonal area as well as the papillary lesions. Path--Benign submucosa with chronic inflammation . He has a very thin bladder wall, and gemcitabine not administered.He did well w/ Bx. Despite negative pathology, he did have papillary recurrences @ dome of bladder.   8.28.2023: Completed 3 maintenance BCG Rxs.   10.10.2023: Cystoscopy was negative.  Cytology inconclusive.   12.19.2023: Completed third round of maintenance BCG   2.13.2024: Cysto negative, cytology + for HG urothelial carcinoma.   3.18.2024: Cysto, bladder biopsies, (B) RGPs. RGPs nml, path--Benign urothelium and submucosa  With chronic inflammation and reactive changes   4.23.2024:  Past Medical History:  Diagnosis Date   Abnormal liver function tests 05/03/2011   Arthritis    Bladder cancer (HCC) 02/2021   high grade nonmuscle invasive bladder cancer   BPH (benign prostatic hypertrophy) 05/03/2011   Elevated transaminase level    GERD (gastroesophageal reflux disease)    Hairy cell leukemia, in remission (HCC) 1992   remission since 2008   IBS (irritable bowel syndrome)    Irritable bowel syndrome    2 yrs ago, states he had inflammation around anus. Biopsy:  inclusive   Lumbar foraminal stenosis 12/31/2019   Lumbar radiculopathy 11/15/2019   Sleep apnea     Past Surgical History:  Procedure Laterality Date   BACK SURGERY     BONE MARROW BIOPSY     2007 and 1992   CATARACT EXTRACTION W/PHACO Right 08/15/2020   Procedure: CATARACT EXTRACTION PHACO AND INTRAOCULAR LENS PLACEMENT RIGHT EYE;  Surgeon: Fabio Pierce, MD;  Location: [facility];  Service: Ophthalmology;  Laterality: Right;  right CDE=7.75   COLONOSCOPY  04/19/2006   with polyps   COLONOSCOPY  09/30/2011   Procedure: COLONOSCOPY;  Surgeon: Malissa Hippo, MD;  [facility];  Service: Endoscopy;  Laterality: N/A;  730   COLONOSCOPY N/A 01/11/2019   Procedure: COLONOSCOPY;  Surgeon: Malissa Hippo, MD;  [facility];  Service: Endoscopy;  Laterality: N/A;  100-office move to 9:30am   CYSTOSCOPY W/ RETROGRADES Bilateral 02/23/2021   Procedure: CYSTOSCOPY WITH RETROGRADE PYELOGRAM;  Surgeon: Marcine Matar, MD;  Location: [facility];  Service: Urology;  Laterality: Bilateral;   CYSTOSCOPY W/ RETROGRADES Bilateral 07/05/2022   Procedure: CYSTOSCOPY WITH RETROGRADE PYELOGRAM;  Surgeon: Marcine Matar, MD;  [facility];  Service: Urology;  Laterality: Bilateral;   CYSTOSCOPY WITH BIOPSY N/A 07/05/2022   Procedure: CYSTOSCOPY WITH BIOPSY;  Surgeon: Marcine Matar, MD;  Location: [facility];  Service: Urology;  Laterality: N/A;  30 MINS   HX COLON POLYPS     TONSILLECTOMY     childhood   TRANSFORAMINAL LUMBAR INTERBODY FUSION W/ MIS 1 LEVEL Right 11/18/2020   Procedure: Right Lumbar Five-Sacral One Minimally invasive transforaminal lumbar interbody fusion;  Surgeon:  Jadene Pierini, MD;  Location: [facility];  Service: Neurosurgery;  Laterality: Right;   TRANSURETHRAL RESECTION OF BLADDER TUMOR N/A 03/26/2021   Procedure: REPEAT TRANSURETHRAL RESECTION OF BLADDER TUMOR (TURBT)/ POST OPERATIVE  INSTILLATION OF GEMCITABINE;  Surgeon: Marcine Matar, MD;  Location[facility];  Service: Urology;  Laterality: N/A;   TRANSURETHRAL RESECTION OF BLADDER TUMOR WITH MITOMYCIN-C N/A 02/23/2021   Procedure: TRANSURETHRAL RESECTION OF BLADDER TUMOR;  Surgeon: Marcine Matar, MD;  [facility]  Service: Urology;  Laterality: N/A;   TRANSURETHRAL RESECTION OF BLADDER TUMOR WITH MITOMYCIN-C N/A 10/29/2021   Procedure: TRANSURETHRAL RESECTION OF BLADDER TUMOR WITH GEMCITABINE;  Surgeon: Marcine Matar, MD;  Location: [facility];  Service: Urology;  Laterality: N/A;    Home Medications:  Allergies as of 08/10/2022       Reactions   Penicillins Swelling   Did it involve swelling of the face/tongue/throat, SOB, or low BP? No Did it involve sudden or severe rash/hives, skin peeling, or any reaction on the inside of your mouth or nose? No Did you need to seek medical attention at a hospital or doctor's office? Was already inpatient at the time When did it last happen?      2005 If all above answers are "NO", may proceed with cephalosporin use.        Medication List        Accurate as of August 09, 2022 10:05 AM. If you have any questions, ask your nurse or doctor.          CHARCOAL ACTIVATED PO Take 520 mg by mouth as needed. Over week   Clear Eyes Seasonal Relief 0.012-0.2-0.25 % Soln Generic drug: Naphazoline-Glycerin-Zinc Sulf Place 1 drop into both eyes daily as needed (allergies).   diphenhydrAMINE 25 mg capsule Commonly known as: BENADRYL Take 25-50 mg by mouth every 6 (six) hours as needed for allergies or sleep.   donepezil 10 MG tablet Commonly known as: ARICEPT Take 5 mg by mouth daily.   finasteride 5 MG tablet Commonly known as: PROSCAR Take 1 tablet (5 mg total) by mouth every morning.   FISH OIL OMEGA-3 PO Take 1,000 mg by mouth daily. 300 mg omega 3. Stopped on 03/13/21 for 03/26/21 surgery.    GINKOBA PO Take 120 mg by mouth as needed. Stopped will restart after surgery   ibuprofen 200 MG tablet Commonly known as: ADVIL Take 400 mg by mouth every 6 (six) hours as needed (Inflammation).   phenazopyridine 200 MG tablet Commonly known as: Pyridium Take 1 tablet (200 mg total) by mouth 3 (three) times daily as needed for pain.   psyllium 58.6 % packet Commonly known as: METAMUCIL Take 1 packet by mouth as needed (constipation).   rosuvastatin 10 MG tablet Commonly known as: CRESTOR Take 10 mg by mouth at bedtime.   tamsulosin 0.4 MG Caps capsule Commonly known as: FLOMAX Take 1 capsule (0.4 mg total) by mouth at bedtime.        Allergies:  Allergies  Allergen Reactions   Penicillins Swelling    Did it involve swelling of the face/tongue/throat, SOB, or low BP? No Did it involve sudden or severe rash/hives, skin peeling, or any reaction on the inside of your mouth or nose? No Did you need to seek medical attention at a hospital or doctor's office? Was already inpatient at the time When did it last happen?      2005 If all above answers are "NO", may proceed  with cephalosporin use.     No family history on file.  Social History:  reports that he quit smoking about 16 years ago. His smoking use included cigarettes. He has never used smokeless tobacco. He reports current alcohol use of about 4.0 standard drinks of alcohol per week. He reports that he does not use drugs.  ROS: A complete review of systems was performed.  All systems are negative except for pertinent findings as noted.  Physical Exam:  Vital signs in last 24 hours: There were no vitals taken for this visit. Constitutional:  Alert and oriented, No acute distress Cardiovascular: Regular rate  Respiratory: Normal respiratory effort GI: Abdomen is soft, nontender, nondistended, no abdominal masses. No CVAT.  Genitourinary: Normal male phallus, testes are descended bilaterally and non-tender and  without masses, scrotum is normal in appearance without lesions or masses, perineum is normal on inspection. Lymphatic: No lymphadenopathy Neurologic: Grossly intact, no focal deficits Psychiatric: Normal mood and affect  I have reviewed prior pt notes  I have reviewed notes from referring/previous physicians  I have reviewed urinalysis results  I have independently reviewed prior imaging  I have reviewed prior PSA results  I have reviewed prior urine culture   Impression/Assessment:  ***  Plan:  ***

## 2022-08-10 ENCOUNTER — Ambulatory Visit (INDEPENDENT_AMBULATORY_CARE_PROVIDER_SITE_OTHER): Payer: Medicare HMO | Admitting: Urology

## 2022-08-10 ENCOUNTER — Encounter: Payer: Self-pay | Admitting: Urology

## 2022-08-10 VITALS — BP 144/77 | HR 65

## 2022-08-10 DIAGNOSIS — N138 Other obstructive and reflux uropathy: Secondary | ICD-10-CM | POA: Diagnosis not present

## 2022-08-10 DIAGNOSIS — N401 Enlarged prostate with lower urinary tract symptoms: Secondary | ICD-10-CM

## 2022-08-10 DIAGNOSIS — C678 Malignant neoplasm of overlapping sites of bladder: Secondary | ICD-10-CM

## 2022-08-10 LAB — URINALYSIS, ROUTINE W REFLEX MICROSCOPIC
Bilirubin, UA: NEGATIVE
Glucose, UA: NEGATIVE
Ketones, UA: NEGATIVE
Leukocytes,UA: NEGATIVE
Nitrite, UA: NEGATIVE
Protein,UA: NEGATIVE
Specific Gravity, UA: 1.02 (ref 1.005–1.030)
Urobilinogen, Ur: 0.2 mg/dL (ref 0.2–1.0)
pH, UA: 6 (ref 5.0–7.5)

## 2022-08-10 LAB — MICROSCOPIC EXAMINATION: Bacteria, UA: NONE SEEN

## 2022-08-10 MED ORDER — BCG LIVE 50 MG IS SUSR
3.2400 mL | Freq: Once | INTRAVESICAL | Status: AC
  Administered 2022-08-10: 81 mg via INTRAVESICAL

## 2022-08-10 NOTE — Addendum Note (Signed)
Addended by: Grier Rocher on: 08/10/2022 09:50 AM   Modules accepted: Orders

## 2022-08-10 NOTE — Progress Notes (Signed)
BCG Bladder Instillation  BCG # 1 of 3  Due to Bladder Cancer patient is present today for a BCG treatment. Patient was cleaned and prepped in a sterile fashion with betadine. A 14FR catheter was inserted, urine return was noted 10ml, urine was yellow in color.  50ml of reconstituted BCG was instilled into the bladder. The catheter was then removed. Patient tolerated well, no complications were noted  Performed by: Guss Bunde, CMA  Follow up/ Additional notes: Follow up as scheduled fo BCG.

## 2022-08-17 ENCOUNTER — Ambulatory Visit (INDEPENDENT_AMBULATORY_CARE_PROVIDER_SITE_OTHER): Payer: Medicare HMO

## 2022-08-17 DIAGNOSIS — C678 Malignant neoplasm of overlapping sites of bladder: Secondary | ICD-10-CM | POA: Diagnosis not present

## 2022-08-17 LAB — URINALYSIS, ROUTINE W REFLEX MICROSCOPIC
Bilirubin, UA: NEGATIVE
Glucose, UA: NEGATIVE
Ketones, UA: NEGATIVE
Leukocytes,UA: NEGATIVE
Nitrite, UA: NEGATIVE
Protein,UA: NEGATIVE
Specific Gravity, UA: 1.01 (ref 1.005–1.030)
Urobilinogen, Ur: 0.2 mg/dL (ref 0.2–1.0)
pH, UA: 5.5 (ref 5.0–7.5)

## 2022-08-17 MED ORDER — BCG LIVE 50 MG IS SUSR
3.2400 mL | Freq: Once | INTRAVESICAL | Status: AC
  Administered 2022-08-17: 81 mg via INTRAVESICAL

## 2022-08-17 NOTE — Progress Notes (Signed)
BCG Bladder Instillation  BCG # 2 of 3  Due to Bladder Cancer patient is present today for a BCG treatment. Patient was cleaned and prepped in a sterile fashion with betadine. A 14FR catheter was inserted, urine return was noted 100ml, urine was yellow in color.  50ml of reconstituted BCG was instilled into the bladder. The catheter was then removed. Patient tolerated well, no complications were noted  Performed by: Chasity Outten, CMA  Follow up/ Additional notes: Follow up as scheduled.    

## 2022-08-23 ENCOUNTER — Other Ambulatory Visit: Payer: Self-pay

## 2022-08-23 DIAGNOSIS — C9141 Hairy cell leukemia, in remission: Secondary | ICD-10-CM

## 2022-08-24 ENCOUNTER — Inpatient Hospital Stay (HOSPITAL_BASED_OUTPATIENT_CLINIC_OR_DEPARTMENT_OTHER): Payer: Medicare HMO | Admitting: Hematology

## 2022-08-24 ENCOUNTER — Inpatient Hospital Stay: Payer: Medicare HMO | Attending: Hematology

## 2022-08-24 VITALS — BP 129/72 | HR 56 | Temp 97.7°F | Resp 16 | Wt 169.6 lb

## 2022-08-24 DIAGNOSIS — C679 Malignant neoplasm of bladder, unspecified: Secondary | ICD-10-CM | POA: Diagnosis not present

## 2022-08-24 DIAGNOSIS — C9141 Hairy cell leukemia, in remission: Secondary | ICD-10-CM | POA: Diagnosis present

## 2022-08-24 DIAGNOSIS — Z85828 Personal history of other malignant neoplasm of skin: Secondary | ICD-10-CM | POA: Diagnosis not present

## 2022-08-24 DIAGNOSIS — Z87891 Personal history of nicotine dependence: Secondary | ICD-10-CM | POA: Insufficient documentation

## 2022-08-24 LAB — CBC WITH DIFFERENTIAL (CANCER CENTER ONLY)
Abs Immature Granulocytes: 0.01 10*3/uL (ref 0.00–0.07)
Basophils Absolute: 0 10*3/uL (ref 0.0–0.1)
Basophils Relative: 0 %
Eosinophils Absolute: 0 10*3/uL (ref 0.0–0.5)
Eosinophils Relative: 0 %
HCT: 38.6 % — ABNORMAL LOW (ref 39.0–52.0)
Hemoglobin: 12.8 g/dL — ABNORMAL LOW (ref 13.0–17.0)
Immature Granulocytes: 0 %
Lymphocytes Relative: 29 %
Lymphs Abs: 1.5 10*3/uL (ref 0.7–4.0)
MCH: 31.8 pg (ref 26.0–34.0)
MCHC: 33.2 g/dL (ref 30.0–36.0)
MCV: 95.8 fL (ref 80.0–100.0)
Monocytes Absolute: 0.4 10*3/uL (ref 0.1–1.0)
Monocytes Relative: 8 %
Neutro Abs: 3.1 10*3/uL (ref 1.7–7.7)
Neutrophils Relative %: 63 %
Platelet Count: 198 10*3/uL (ref 150–400)
RBC: 4.03 MIL/uL — ABNORMAL LOW (ref 4.22–5.81)
RDW: 13.7 % (ref 11.5–15.5)
WBC Count: 5 10*3/uL (ref 4.0–10.5)
nRBC: 0 % (ref 0.0–0.2)

## 2022-08-24 LAB — CMP (CANCER CENTER ONLY)
ALT: 28 U/L (ref 0–44)
AST: 26 U/L (ref 15–41)
Albumin: 4 g/dL (ref 3.5–5.0)
Alkaline Phosphatase: 74 U/L (ref 38–126)
Anion gap: 5 (ref 5–15)
BUN: 15 mg/dL (ref 8–23)
CO2: 30 mmol/L (ref 22–32)
Calcium: 9 mg/dL (ref 8.9–10.3)
Chloride: 104 mmol/L (ref 98–111)
Creatinine: 0.98 mg/dL (ref 0.61–1.24)
GFR, Estimated: >60 mL/min (ref >60)
Glucose, Bld: 100 mg/dL — ABNORMAL HIGH (ref 70–99)
Potassium: 3.6 mmol/L (ref 3.5–5.1)
Sodium: 139 mmol/L (ref 135–145)
Total Bilirubin: 0.9 mg/dL (ref 0.3–1.2)
Total Protein: 6.9 g/dL (ref 6.5–8.1)

## 2022-08-24 LAB — LACTATE DEHYDROGENASE: LDH: 170 U/L (ref 98–192)

## 2022-08-24 NOTE — Progress Notes (Signed)
HEMATOLOGY/ONCOLOGY CLINIC NOTE  Date of Service: 08/24/2022  Patient Care Team: Carylon Perches, MD as PCP - General (Internal Medicine) Kemper Durie, RN as [facility] Care Management  CHIEF COMPLAINTS/PURPOSE OF CONSULTATION:  continued evaluation and management of his Hairy Cell Leukemia.  Oncologic History:   Cameron Wu was initially diagnosed with Hairy Cell Leukemia in January 1992, at which time he presented with constitutional symptoms and massive splenomegaly. He was treated with Cladribine by special exception protocol from the [facility] before the drug was officially FDA approved. He had progression in July 1995, and ws treated again with Cladribine. The pt then progressed again in June 2007, and was treated with Cladribine and Rituxan. He has remained in remission since that time.  HISTORY OF PRESENTING ILLNESS:   Cameron Wu is a wonderful 72 y.o. male who has been referred to [place] by Dr. Cephas Darby for evaluation and management of his Hairy Cell Leukemia. The pt reports that he is doing well overall.  The pt reports that he has not developed any new concerns since his last visit with Dr. Cyndie Chime a month ago. He denies abdominal pains or worsened energy levels, fevers, chills, night sweats, or unexpected weight loss. The pt notes that he has some chronic right shoulder and knee pain, residual from a fall about a year ago. He denies concerns for recent infections. He has followed with his PCP for is flu and pneumonia vaccinations.  The pt notes that each time he received Cladribine, he received a dose each day for 5 consecutive days in an outpatient setting. The pt denies any major side effects from the chemotherapy, but did have cytopenias.   The pt notes that at his last relapse in 2007, he did not have significant symptoms, but was seen to develop abnormal blood counts, and was confirmed with a BM Bx.  He denies swallowing  problems, heart issues, lung issues, or liver problems. He notes that he has a "nervous stomach which growls for hours after eating." He sees Dr. Retta Diones in Urology for his BPH and takes Finasteride. The pt notes social alcohol use and denies ever being drunk.  Most recent lab results (07/03/18) of CBC w/diff is as follows: all values are WNL.  On review of systems, pt reports good energy levels, eating well, stable weight, and denies fevers, chills, night sweats, unexpected weight loss, swallowing difficulty, breathing difficulty, leg swelling, skin rashes or lesions, concern for infections, mouth sores, pain along the spine, lower abdominal pains, leg swelling, noticing any new lumps or bumps, and any other symptoms.   On PMHx the pt reports Hairy cell leukemia, environmental allergies  INTERVAL HISTORY:  Cameron Wu is a wonderful 72 y.o. male who is here for continued evaluation and management of his Hairy Cell Leukemia.   Patient was last seen by me on 07/23/2021 and he was doing well overall.   Patient reports he has been doing fairly well since our last visit. He has been diagnosed with recurrence bladder cancer in October 2023. Has been taking BCG treatment for 3 cycle. He notes that his last cycle is this week. Prior to his bladder cancer recurrence, patient notes he was complaining of Dysuria.   His last cytology was on 06/01/2022, which was abnormal. He had Surgery cystoscopy with biopsy on 07/05/2022, which showed improvement with his BCG treatment. Patient regularly follows-up with his Urologist, Dr. Marcine Matar. Patient notes that his current treatment plan is to  continue BCG treatment for his bladder cancer. Patient reports that after his last surgery in march, he started developing loss of taste, soft stools, and gastrointestinal problems.   During this visit, patient complains of Dysuria, short-term memory loss, and lower abdominal pain. He notes that Dysuria is due to  his BCG treatment.   Patient notes his PCP has increased his Donepezil dose in January. He notes that the increased dose has been helping his memory symptoms.   MEDICAL HISTORY:  Past Medical History:  Diagnosis Date   Abnormal liver function tests 05/03/2011   Arthritis    Bladder cancer (HCC) 02/2021   high grade nonmuscle invasive bladder cancer   BPH (benign prostatic hypertrophy) 05/03/2011   Elevated transaminase level    GERD (gastroesophageal reflux disease)    Hairy cell leukemia, in remission (HCC) 1992   remission since 2008   IBS (irritable bowel syndrome)    Irritable bowel syndrome    2 yrs ago, states he had inflammation around anus. Biopsy: inclusive   Lumbar foraminal stenosis 12/31/2019   Lumbar radiculopathy 11/15/2019   Sleep apnea     SURGICAL HISTORY: Past Surgical History:  Procedure Laterality Date   BACK SURGERY     BONE MARROW BIOPSY     2007 and 1992   CATARACT EXTRACTION W/PHACO Right 08/15/2020   Procedure: CATARACT EXTRACTION PHACO AND INTRAOCULAR LENS PLACEMENT RIGHT EYE;  Surgeon: Fabio Pierce, MD;  Location: [facility];  Service: Ophthalmology;  Laterality: Right;  right CDE=7.75   COLONOSCOPY  04/19/2006   with polyps   COLONOSCOPY  09/30/2011   Procedure: COLONOSCOPY;  Surgeon: Malissa Hippo, MD;  [facility];  Service: Endoscopy;  Laterality: N/A;  730   COLONOSCOPY N/A 01/11/2019   Procedure: COLONOSCOPY;  Surgeon: Malissa Hippo, MD;  [facility];  Service: Endoscopy;  Laterality: N/A;  100-office move to 9:30am   CYSTOSCOPY W/ RETROGRADES Bilateral 02/23/2021   Procedure: CYSTOSCOPY WITH RETROGRADE PYELOGRAM;  Surgeon: Marcine Matar, MD;  Location: [facility];  Service: Urology;  Laterality: Bilateral;   CYSTOSCOPY W/ RETROGRADES Bilateral 07/05/2022   Procedure: CYSTOSCOPY WITH RETROGRADE PYELOGRAM;  Surgeon: Marcine Matar, MD;  Location: [facility];  Service: Urology;   Laterality: Bilateral;   CYSTOSCOPY WITH BIOPSY N/A 07/05/2022   Procedure: CYSTOSCOPY WITH BIOPSY;  Surgeon: Marcine Matar, MD;  Location: [facility];  Service: Urology;  Laterality: N/A;  30 MINS   HX COLON POLYPS     TONSILLECTOMY     childhood   TRANSFORAMINAL LUMBAR INTERBODY FUSION W/ MIS 1 LEVEL Right 11/18/2020   Procedure: Right Lumbar Five-Sacral One Minimally invasive transforaminal lumbar interbody fusion;  Surgeon: Jadene Pierini, MD;  [facility];  Service: Neurosurgery;  Laterality: Right;   TRANSURETHRAL RESECTION OF BLADDER TUMOR N/A 03/26/2021   Procedure: REPEAT TRANSURETHRAL RESECTION OF BLADDER TUMOR (TURBT)/ POST OPERATIVE INSTILLATION OF GEMCITABINE;  Surgeon: Marcine Matar, MD;  Location: [facility];  Service: Urology;  Laterality: N/A;   TRANSURETHRAL RESECTION OF BLADDER TUMOR WITH MITOMYCIN-C N/A 02/23/2021   Procedure: TRANSURETHRAL RESECTION OF BLADDER TUMOR;  Surgeon: Marcine Matar, MD;  Location: [facility];  Service: Urology;  Laterality: N/A;   TRANSURETHRAL RESECTION OF BLADDER TUMOR WITH MITOMYCIN-C N/A 10/29/2021   Procedure: TRANSURETHRAL RESECTION OF BLADDER TUMOR WITH GEMCITABINE;  Surgeon: Marcine Matar, MD;  Location: [facility];  Service: Urology;  Laterality: N/A;    SOCIAL HISTORY: Social History  Socioeconomic History   Marital status: Married    Spouse name: Not on file   Number of children: Not on file   Years of education: Not on file   Highest education level: Not on file  Occupational History   Not on file  Tobacco Use   Smoking status: Former    Types: Cigarettes    Quit date: 12/18/2005    Years since quitting: 16.6   Smokeless tobacco: Never  Vaping Use   Vaping Use: Never used  Substance and Sexual Activity   Alcohol use: Yes    Alcohol/week: 4.0 standard drinks of alcohol    Types: 4 Cans of beer per week    Comment: social   Drug  use: No   Sexual activity: Not on file  Other Topics Concern   Not on file  Social History Narrative   Not on file   Social Determinants of Health   Financial Resource Strain: Not on file  Food Insecurity: No Food Insecurity (12/28/2021)   Hunger Vital Sign    Worried About Running Out of Food in the Last Year: Never true    Ran Out of Food in the Last Year: Never true  Transportation Needs: No Transportation Needs (12/28/2021)   PRAPARE - Administrator, Civil Service (Medical): No    Lack of Transportation (Non-Medical): No  Physical Activity: Not on file  Stress: Not on file  Social Connections: Not on file  Intimate Partner Violence: Not on file    FAMILY HISTORY: No family history on file.  ALLERGIES:  is allergic to penicillins.  MEDICATIONS:  Current Outpatient Medications  Medication Sig Dispense Refill   CHARCOAL ACTIVATED PO Take 520 mg by mouth as needed. Over week     diphenhydrAMINE (BENADRYL) 25 mg capsule Take 25-50 mg by mouth every 6 (six) hours as needed for allergies or sleep.     donepezil (ARICEPT) 10 MG tablet Take 5 mg by mouth daily.     finasteride (PROSCAR) 5 MG tablet Take 1 tablet (5 mg total) by mouth every morning. 90 tablet 3   Ginkgo Biloba (GINKOBA PO) Take 120 mg by mouth as needed. Stopped will restart after surgery     ibuprofen (ADVIL,MOTRIN) 200 MG tablet Take 400 mg by mouth every 6 (six) hours as needed (Inflammation).     Naphazoline-Glycerin-Zinc Sulf (CLEAR EYES SEASONAL RELIEF) 0.012-0.2-0.25 % SOLN Place 1 drop into both eyes daily as needed (allergies).     Omega-3 Fatty Acids (FISH OIL OMEGA-3 PO) Take 1,000 mg by mouth daily. 300 mg omega 3. Stopped on 03/13/21 for 03/26/21 surgery.     phenazopyridine (PYRIDIUM) 200 MG tablet Take 1 tablet (200 mg total) by mouth 3 (three) times daily as needed for pain. 10 tablet 1   psyllium (METAMUCIL) 58.6 % packet Take 1 packet by mouth as needed (constipation).     rosuvastatin  (CRESTOR) 10 MG tablet Take 10 mg by mouth at bedtime.     tamsulosin (FLOMAX) 0.4 MG CAPS capsule Take 1 capsule (0.4 mg total) by mouth at bedtime. 90 capsule 3   No current facility-administered medications for this visit.   Facility-Administered Medications Ordered in Other Visits  Medication Dose Route Frequency Provider Last Rate Last Admin   gemcitabine (GEMZAR) chemo syringe for bladder instillation 2,000 mg  2,000 mg Bladder Instillation Once Marcine Matar, MD        REVIEW OF SYSTEMS:   10 Point review of Systems was done is  negative except as noted above.  PHYSICAL EXAMINATION: ECOG PERFORMANCE STATUS: 1 - Symptomatic but completely ambulatory  . Vitals:   08/24/22 1134  BP: 129/72  Pulse: (!) 56  Resp: 16  Temp: 97.7 F (36.5 C)  SpO2: 99%   Filed Weights   08/24/22 1134  Weight: 169 lb 9.6 oz (76.9 kg)   .Body mass index is 23 kg/m.  NAD GENERAL:alert, in no acute distress and comfortable SKIN: no acute rashes, no significant lesions EYES: conjunctiva are pink and non-injected, sclera anicteric NECK: supple, no JVD LYMPH:  no palpable lymphadenopathy in the cervical, axillary or inguinal regions LUNGS: clear to auscultation b/l with normal respiratory effort HEART: regular rate & rhythm ABDOMEN:  normoactive bowel sounds , non tender, not distended. Extremity: no pedal edema PSYCH: alert & oriented x 3 with fluent speech NEURO: no focal motor/sensory deficits  LABORATORY DATA:  I have reviewed the data as listed  .    Latest Ref Rng & Units 08/24/2022   10:51 AM 07/27/2022   10:08 AM 07/23/2021    1:29 PM  CBC  WBC 4.0 - 10.5 K/uL 5.0  5.1  5.7   Hemoglobin 13.0 - 17.0 g/dL 40.9  81.1  91.4   Hematocrit 39.0 - 52.0 % 38.6  38.6  39.0   Platelets 150 - 400 K/uL 198  144  164     .    Latest Ref Rng & Units 08/24/2022   10:51 AM 07/27/2022   10:08 AM 07/23/2021    1:29 PM  CMP  Glucose 70 - 99 mg/dL 782  956  96   BUN 8 - 23 mg/dL 15  12  14     Creatinine 0.61 - 1.24 mg/dL 2.13  0.86  5.78   Sodium 135 - 145 mmol/L 139  144  141   Potassium 3.5 - 5.1 mmol/L 3.6  3.8  3.9   Chloride 98 - 111 mmol/L 104  103  107   CO2 22 - 32 mmol/L 30  24  30    Calcium 8.9 - 10.3 mg/dL 9.0  9.2  9.0   Total Protein 6.5 - 8.1 g/dL 6.9   6.7   Total Bilirubin 0.3 - 1.2 mg/dL 0.9   0.7   Alkaline Phos 38 - 126 U/L 74   91   AST 15 - 41 U/L 26   24   ALT 0 - 44 U/L 28   28     12/02/05 BM Bx:   12/02/05 Flow Cytometry:     RADIOGRAPHIC STUDIES: I have personally reviewed the radiological images as listed and agreed with the findings in the report. No results found.  ASSESSMENT & PLAN:   72 y.o. male with  1. Hairy cell leukemia, currently in 3rd remission Diagnosed in January 1992 presented with constitutional symptoms and massive splenomegaly. S/p Cladribine July 1995 progression  S/p Cladribine June 2007 progression S/p Cladribine and Rituxan  2. Newly diagnosed bladder cancer-- had TURBT and BCG-- folloiws with [facility]. Mx per urology  PLAN: -discussed lab results from today, 08/24/2022, with the patient. CBC and CMP stable.  -Recommended to start taking probiotics to see if it helps his gastrointestinal problems.  -Continue to follow-up with Urologist and PCP. -The pt shows no clinical or lab progression/return of his hairy cell leukemia at this time.  -No indication for further treatment at this time. -Will see back in 12 months with labs.  FOLLOW-UP: RTC with Dr Candise Che with labs in 12  months   The total time spent in the appointment was 20 minutes* .  All of the patient's questions were answered with apparent satisfaction. The patient knows to call the clinic with any problems, questions or concerns.   Wyvonnia Lora MD MS AAHIVMS [facility] Hematology/Oncology Physician [facility]  .*Total Encounter Time as defined by the Centers for Medicare and Medicaid Services includes, in addition to the  face-to-face time of a patient visit (documented in the note above) non-face-to-face time: obtaining and reviewing outside history, ordering and reviewing medications, tests or procedures, care coordination (communications with other health care professionals or caregivers) and documentation in the medical record.   I, Ok Edwards, am acting as a Neurosurgeon for Wyvonnia Lora, MD.  .I have reviewed the above documentation for accuracy and completeness, and I agree with the above. Johney Maine MD

## 2022-08-25 ENCOUNTER — Ambulatory Visit: Payer: Medicare HMO

## 2022-08-26 ENCOUNTER — Ambulatory Visit (INDEPENDENT_AMBULATORY_CARE_PROVIDER_SITE_OTHER): Payer: Medicare HMO

## 2022-08-26 DIAGNOSIS — C678 Malignant neoplasm of overlapping sites of bladder: Secondary | ICD-10-CM

## 2022-08-26 LAB — URINALYSIS, ROUTINE W REFLEX MICROSCOPIC
Bilirubin, UA: NEGATIVE
Glucose, UA: NEGATIVE
Ketones, UA: NEGATIVE
Nitrite, UA: NEGATIVE
Protein,UA: NEGATIVE
Specific Gravity, UA: <1.005 — ABNORMAL LOW (ref 1.005–1.030)
Urobilinogen, Ur: 0.2 mg/dL (ref 0.2–1.0)
pH, UA: 5.5 (ref 5.0–7.5)

## 2022-08-26 MED ORDER — BCG LIVE 50 MG IS SUSR
3.2400 mL | Freq: Once | INTRAVESICAL | Status: AC
  Administered 2022-08-26: 81 mg via INTRAVESICAL

## 2022-08-26 NOTE — Progress Notes (Signed)
BCG Bladder Instillation  BCG # 3 of 3  Due to Bladder Cancer patient is present today for a BCG treatment. Patient was cleaned and prepped in a sterile fashion with betadine. A 14 FR catheter was inserted, urine return was noted 200 ml, urine was yellow in color.  50ml of reconstituted BCG was instilled into the bladder. The catheter was then removed. Patient tolerated well, no complications were noted  Performed by: Kennyth Lose, CMA  Follow up/ Additional notes: 6 week Cysto

## 2022-10-05 ENCOUNTER — Ambulatory Visit (INDEPENDENT_AMBULATORY_CARE_PROVIDER_SITE_OTHER): Payer: Medicare HMO | Admitting: Urology

## 2022-10-05 ENCOUNTER — Encounter: Payer: Self-pay | Admitting: Urology

## 2022-10-05 VITALS — BP 134/73 | HR 64

## 2022-10-05 DIAGNOSIS — N138 Other obstructive and reflux uropathy: Secondary | ICD-10-CM | POA: Diagnosis not present

## 2022-10-05 DIAGNOSIS — Z8551 Personal history of malignant neoplasm of bladder: Secondary | ICD-10-CM | POA: Diagnosis not present

## 2022-10-05 DIAGNOSIS — C678 Malignant neoplasm of overlapping sites of bladder: Secondary | ICD-10-CM

## 2022-10-05 DIAGNOSIS — R972 Elevated prostate specific antigen [PSA]: Secondary | ICD-10-CM | POA: Diagnosis not present

## 2022-10-05 DIAGNOSIS — N401 Enlarged prostate with lower urinary tract symptoms: Secondary | ICD-10-CM

## 2022-10-05 LAB — URINALYSIS, ROUTINE W REFLEX MICROSCOPIC
Bilirubin, UA: NEGATIVE
Glucose, UA: NEGATIVE
Ketones, UA: NEGATIVE
Leukocytes,UA: NEGATIVE
Nitrite, UA: NEGATIVE
Protein,UA: NEGATIVE
RBC, UA: NEGATIVE
Specific Gravity, UA: <1.005 — ABNORMAL LOW (ref 1.005–1.030)
Urobilinogen, Ur: 0.2 mg/dL (ref 0.2–1.0)
pH, UA: 6.5 (ref 5.0–7.5)

## 2022-10-05 MED ORDER — CIPROFLOXACIN HCL 500 MG PO TABS
500.0000 mg | ORAL_TABLET | Freq: Once | ORAL | Status: AC
  Administered 2022-10-05: 500 mg via ORAL

## 2022-10-05 NOTE — Progress Notes (Signed)
[facility] tracking # [id number] [facility]) Unable to get confirmation pick up number.

## 2022-10-05 NOTE — Progress Notes (Signed)
History of Present Illness: Cameron Wu is here for followup of bladder cancer.   11.7.2022: TURBT-  Pathology-high-grade nonmuscle invasive bladder cancer.--6.5 cm posterior wall lesion.  Muscularis present in specimen.  He did not receive gemcitabine.   12.8.2022: Repeat TURBT--8 mm Lt anterior wall papillary lesion, repeat posterior bladder wall lesion resection. Both specimens revealed HG NMIBC. Gemcitabine administered.   2.8.2023: Induction BCG (6 rxs) completed.   3.21.2023: Here for follow-up with cystoscopy.    5.10.2023: Completed first BCG maintenance   6.27.2023: Here for surveillance cystoscopy.  Significant erythematous area in his trigonal region as well as 2-3 papillary lesions in his bladder dome.   7.13.2023: He underwent biopsy of his trigonal area as well as the papillary lesions. Path--Benign submucosa with chronic inflammation . He has a very thin bladder wall, and gemcitabine not administered.He did well w/ Bx. Despite negative pathology, he did have papillary recurrences @ dome of bladder.   8.28.2023: Completed 3 maintenance BCG Rxs.   10.10.2023: Cystoscopy was negative.  Cytology inconclusive.   12.19.2023: Completed third round of maintenance BCG   2.13.2024: Cysto negative, cytology + for HG urothelial carcinoma.   3.18.2024: Cysto, bladder biopsies, (B) RGPs. RGPs nml, path--Benign urothelium and submucosa  With chronic inflammation and reactive changes    5.9.2024: Completed maintenance BCG  6.18.2024: He tolerated his BCG well.  He has not had significant LUTS or hematuria.   Past Medical History:  Diagnosis Date   Abnormal liver function tests 05/03/2011   Arthritis    Bladder cancer (HCC) 02/2021   high grade nonmuscle invasive bladder cancer   BPH (benign prostatic hypertrophy) 05/03/2011   Elevated transaminase level    GERD (gastroesophageal reflux disease)    Hairy cell leukemia, in remission (HCC) 1992   remission since 2008   IBS  (irritable bowel syndrome)    Irritable bowel syndrome    2 yrs ago, states he had inflammation around anus. Biopsy: inclusive   Lumbar foraminal stenosis 12/31/2019   Lumbar radiculopathy 11/15/2019   Sleep apnea     Past Surgical History:  Procedure Laterality Date   BACK SURGERY     BONE MARROW BIOPSY     2007 and 1992   CATARACT EXTRACTION W/PHACO Right 08/15/2020   Procedure: CATARACT EXTRACTION PHACO AND INTRAOCULAR LENS PLACEMENT RIGHT EYE;  Surgeon: Fabio Pierce, MD;  Location: [facility];  Service: Ophthalmology;  Laterality: Right;  right CDE=7.75   COLONOSCOPY  04/19/2006   with polyps   COLONOSCOPY  09/30/2011   Procedure: COLONOSCOPY;  Surgeon: Malissa Hippo, MD;  [facility];  Service: Endoscopy;  Laterality: N/A;  730   COLONOSCOPY N/A 01/11/2019   Procedure: COLONOSCOPY;  Surgeon: Malissa Hippo, MD;  [facility];  Service: Endoscopy;  Laterality: N/A;  100-office move to 9:30am   CYSTOSCOPY W/ RETROGRADES Bilateral 02/23/2021   Procedure: CYSTOSCOPY WITH RETROGRADE PYELOGRAM;  Surgeon: Marcine Matar, MD;  Location: [facility];  Service: Urology;  Laterality: Bilateral;   CYSTOSCOPY W/ RETROGRADES Bilateral 07/05/2022   Procedure: CYSTOSCOPY WITH RETROGRADE PYELOGRAM;  Surgeon: Marcine Matar, MD;  Location: [facility];  Service: Urology;  Laterality: Bilateral;   CYSTOSCOPY WITH BIOPSY N/A 07/05/2022   Procedure: CYSTOSCOPY WITH BIOPSY;  Surgeon: Marcine Matar, MD;  Location: [facility];  Service: Urology;  Laterality: N/A;  30 MINS   HX COLON POLYPS     TONSILLECTOMY     childhood   TRANSFORAMINAL LUMBAR INTERBODY FUSION W/  MIS 1 LEVEL Right 11/18/2020   Procedure: Right Lumbar Five-Sacral One Minimally invasive transforaminal lumbar interbody fusion;  Surgeon: Jadene Pierini, MD;  [facility];  Service: Neurosurgery;  Laterality: Right;   TRANSURETHRAL RESECTION OF  BLADDER TUMOR N/A 03/26/2021   Procedure: REPEAT TRANSURETHRAL RESECTION OF BLADDER TUMOR (TURBT)/ POST OPERATIVE INSTILLATION OF GEMCITABINE;  Surgeon: Marcine Matar, MD;  Location: [facility];  Service: Urology;  Laterality: N/A;   TRANSURETHRAL RESECTION OF BLADDER TUMOR WITH MITOMYCIN-C N/A 02/23/2021   Procedure: TRANSURETHRAL RESECTION OF BLADDER TUMOR;  Surgeon: Marcine Matar, MD;  Location: [facility];  Service: Urology;  Laterality: N/A;   TRANSURETHRAL RESECTION OF BLADDER TUMOR WITH MITOMYCIN-C N/A 10/29/2021   Procedure: TRANSURETHRAL RESECTION OF BLADDER TUMOR WITH GEMCITABINE;  Surgeon: Marcine Matar, MD;  Location: [facility];  Service: Urology;  Laterality: N/A;    Home Medications:  Allergies as of 10/05/2022       Reactions   Penicillins Swelling   Did it involve swelling of the face/tongue/throat, SOB, or low BP? No Did it involve sudden or severe rash/hives, skin peeling, or any reaction on the inside of your mouth or nose? No Did you need to seek medical attention at a hospital or doctor's office? Was already inpatient at the time When did it last happen?      2005 If all above answers are "NO", may proceed with cephalosporin use.        Medication List        Accurate as of October 05, 2022  7:39 AM. If you have any questions, ask your nurse or doctor.          CHARCOAL ACTIVATED PO Take 520 mg by mouth as needed. Over week   Clear Eyes Seasonal Relief 0.012-0.2-0.25 % Soln Generic drug: Naphazoline-Glycerin-Zinc Sulf Place 1 drop into both eyes daily as needed (allergies).   diphenhydrAMINE 25 mg capsule Commonly known as: BENADRYL Take 25-50 mg by mouth every 6 (six) hours as needed for allergies or sleep.   donepezil 10 MG tablet Commonly known as: ARICEPT Take 5 mg by mouth daily.   finasteride 5 MG tablet Commonly known as: PROSCAR Take 1 tablet (5 mg total) by mouth every morning.    FISH OIL OMEGA-3 PO Take 1,000 mg by mouth daily. 300 mg omega 3. Stopped on 03/13/21 for 03/26/21 surgery.   GINKOBA PO Take 120 mg by mouth as needed. Stopped will restart after surgery   ibuprofen 200 MG tablet Commonly known as: ADVIL Take 400 mg by mouth every 6 (six) hours as needed (Inflammation).   phenazopyridine 200 MG tablet Commonly known as: Pyridium Take 1 tablet (200 mg total) by mouth 3 (three) times daily as needed for pain.   psyllium 58.6 % packet Commonly known as: METAMUCIL Take 1 packet by mouth as needed (constipation).   rosuvastatin 10 MG tablet Commonly known as: CRESTOR Take 10 mg by mouth at bedtime.   tamsulosin 0.4 MG Caps capsule Commonly known as: FLOMAX Take 1 capsule (0.4 mg total) by mouth at bedtime.        Allergies:  Allergies  Allergen Reactions   Penicillins Swelling    Did it involve swelling of the face/tongue/throat, SOB, or low BP? No Did it involve sudden or severe rash/hives, skin peeling, or any reaction on the inside of your mouth or nose? No Did you need to seek medical attention at a hospital or doctor's office? Was already inpatient at  the time When did it last happen?      2005 If all above answers are "NO", may proceed with cephalosporin use.     No family history on file.  Social History:  reports that he quit smoking about 16 years ago. His smoking use included cigarettes. He has never used smokeless tobacco. He reports current alcohol use of about 4.0 standard drinks of alcohol per week. He reports that he does not use drugs.  ROS: A complete review of systems was performed.  All systems are negative except for pertinent findings as noted.  Physical Exam:  Vital signs in last 24 hours: There were no vitals taken for this visit. Constitutional:  Alert and oriented, No acute distress Cardiovascular: Regular rate  Respiratory: Normal respiratory effort Neurologic: Grossly intact, no focal  deficits Psychiatric: Normal mood and affect  I have reviewed prior pt notes  I have reviewed urinalysis results  I have independently reviewed prior imaging  I have reviewed prior PSA results  I have reviewed prior u pathology results  Most recent op note reviewed  Cystoscopy Procedure Note:  Indication: Bladder cancer surveillance  After informed consent and discussion of the procedure and its risks, Cameron Wu was positioned and prepped in the standard fashion.  Cystoscopy was performed with a flexible cystoscope.   Findings: Urethra: No lesions/stricture Prostate: Minimally obstructing Bladder neck: No contracture Ureteral orifices: Normal bilaterally Bladder: Urothelium normal except for prior biopsy sites.  Slightly erythematous, no papillary or nodular structures.  It bled easily.  The patient tolerated the procedure well.      Impression/Assessment:  Treated recurrent high-grade nonmuscle invasive bladder cancer.  Following positive cytology most recent biopsy in March was negative.  Upper tracts normal.  Cystoscopy today consistent with prior biopsy and BCG.  No strong suspicion of recurrence  Plan:  1.  I sent bladder washings for cytology as well as FISH  2.  I will notify him of results and then proper follow-up.

## 2022-10-07 ENCOUNTER — Other Ambulatory Visit (HOSPITAL_COMMUNITY): Payer: Self-pay | Admitting: Internal Medicine

## 2022-10-07 DIAGNOSIS — R252 Cramp and spasm: Secondary | ICD-10-CM

## 2022-10-07 DIAGNOSIS — R413 Other amnesia: Secondary | ICD-10-CM

## 2022-10-07 DIAGNOSIS — S0990XA Unspecified injury of head, initial encounter: Secondary | ICD-10-CM

## 2022-10-07 DIAGNOSIS — R2689 Other abnormalities of gait and mobility: Secondary | ICD-10-CM

## 2022-10-07 LAB — CYTOLOGY, URINE

## 2022-10-08 ENCOUNTER — Telehealth: Payer: Self-pay

## 2022-10-08 NOTE — Telephone Encounter (Signed)
Return call to [facility] with [place]. Susy Frizzle was giving the collected date and [facility] bladder wah. Matt voiced understanding.

## 2022-10-15 ENCOUNTER — Ambulatory Visit (HOSPITAL_COMMUNITY): Payer: Medicare HMO

## 2022-10-18 ENCOUNTER — Telehealth: Payer: Self-pay

## 2022-10-18 NOTE — Telephone Encounter (Signed)
Forward to Dr. Retta Diones

## 2022-10-18 NOTE — Telephone Encounter (Signed)
-----   Message from Ferdinand Lango, RN sent at 10/11/2022 10:42 AM EDT ----- Lab request

## 2022-10-20 ENCOUNTER — Ambulatory Visit (HOSPITAL_COMMUNITY)
Admission: RE | Admit: 2022-10-20 | Discharge: 2022-10-20 | Disposition: A | Discharge status: Home / Self Care | Payer: Medicare HMO | Source: Ambulatory Visit | Attending: Internal Medicine | Admitting: Internal Medicine

## 2022-10-20 DIAGNOSIS — R413 Other amnesia: Secondary | ICD-10-CM | POA: Insufficient documentation

## 2022-10-20 DIAGNOSIS — S0990XA Unspecified injury of head, initial encounter: Secondary | ICD-10-CM | POA: Diagnosis present

## 2022-10-20 DIAGNOSIS — R252 Cramp and spasm: Secondary | ICD-10-CM | POA: Insufficient documentation

## 2022-10-20 DIAGNOSIS — R2689 Other abnormalities of gait and mobility: Secondary | ICD-10-CM | POA: Insufficient documentation

## 2022-11-03 ENCOUNTER — Other Ambulatory Visit (HOSPITAL_COMMUNITY): Payer: Self-pay | Admitting: Internal Medicine

## 2022-11-03 DIAGNOSIS — E041 Nontoxic single thyroid nodule: Secondary | ICD-10-CM

## 2022-11-11 ENCOUNTER — Ambulatory Visit (HOSPITAL_COMMUNITY)
Admission: RE | Admit: 2022-11-11 | Discharge: 2022-11-11 | Disposition: A | Discharge status: Home / Self Care | Payer: Medicare HMO | Source: Ambulatory Visit | Attending: Internal Medicine | Admitting: Internal Medicine

## 2022-11-11 DIAGNOSIS — E041 Nontoxic single thyroid nodule: Secondary | ICD-10-CM | POA: Insufficient documentation

## 2022-12-17 ENCOUNTER — Emergency Department (HOSPITAL_COMMUNITY): Payer: Medicare HMO

## 2022-12-17 ENCOUNTER — Encounter (HOSPITAL_COMMUNITY): Payer: Self-pay

## 2022-12-17 ENCOUNTER — Inpatient Hospital Stay (HOSPITAL_COMMUNITY)
Admission: EM | Admit: 2022-12-17 | Discharge: 2022-12-20 | DRG: 522 | Disposition: A | Discharge status: Home Health | Payer: Medicare HMO | Attending: Internal Medicine | Admitting: Internal Medicine

## 2022-12-17 ENCOUNTER — Inpatient Hospital Stay (HOSPITAL_COMMUNITY): Payer: Medicare HMO

## 2022-12-17 ENCOUNTER — Other Ambulatory Visit: Payer: Self-pay

## 2022-12-17 DIAGNOSIS — Z8551 Personal history of malignant neoplasm of bladder: Secondary | ICD-10-CM

## 2022-12-17 DIAGNOSIS — W010XXA Fall on same level from slipping, tripping and stumbling without subsequent striking against object, initial encounter: Secondary | ICD-10-CM | POA: Diagnosis present

## 2022-12-17 DIAGNOSIS — E785 Hyperlipidemia, unspecified: Secondary | ICD-10-CM | POA: Diagnosis present

## 2022-12-17 DIAGNOSIS — S72001A Fracture of unspecified part of neck of right femur, initial encounter for closed fracture: Secondary | ICD-10-CM | POA: Diagnosis not present

## 2022-12-17 DIAGNOSIS — Z880 Allergy status to penicillin: Secondary | ICD-10-CM | POA: Diagnosis not present

## 2022-12-17 DIAGNOSIS — K219 Gastro-esophageal reflux disease without esophagitis: Secondary | ICD-10-CM | POA: Diagnosis present

## 2022-12-17 DIAGNOSIS — Z961 Presence of intraocular lens: Secondary | ICD-10-CM | POA: Diagnosis present

## 2022-12-17 DIAGNOSIS — W19XXXA Unspecified fall, initial encounter: Secondary | ICD-10-CM

## 2022-12-17 DIAGNOSIS — S72001G Fracture of unspecified part of neck of right femur, subsequent encounter for closed fracture with delayed healing: Secondary | ICD-10-CM | POA: Diagnosis not present

## 2022-12-17 DIAGNOSIS — Y9373 Activity, racquet and hand sports: Secondary | ICD-10-CM | POA: Diagnosis not present

## 2022-12-17 DIAGNOSIS — Z79899 Other long term (current) drug therapy: Secondary | ICD-10-CM | POA: Diagnosis not present

## 2022-12-17 DIAGNOSIS — Z9841 Cataract extraction status, right eye: Secondary | ICD-10-CM

## 2022-12-17 DIAGNOSIS — C9141 Hairy cell leukemia, in remission: Secondary | ICD-10-CM | POA: Diagnosis present

## 2022-12-17 DIAGNOSIS — M5416 Radiculopathy, lumbar region: Secondary | ICD-10-CM | POA: Diagnosis present

## 2022-12-17 DIAGNOSIS — Z87891 Personal history of nicotine dependence: Secondary | ICD-10-CM | POA: Diagnosis not present

## 2022-12-17 DIAGNOSIS — G4730 Sleep apnea, unspecified: Secondary | ICD-10-CM | POA: Diagnosis present

## 2022-12-17 DIAGNOSIS — N400 Enlarged prostate without lower urinary tract symptoms: Secondary | ICD-10-CM | POA: Diagnosis present

## 2022-12-17 DIAGNOSIS — Z9221 Personal history of antineoplastic chemotherapy: Secondary | ICD-10-CM

## 2022-12-17 DIAGNOSIS — S72011A Unspecified intracapsular fracture of right femur, initial encounter for closed fracture: Principal | ICD-10-CM | POA: Diagnosis present

## 2022-12-17 DIAGNOSIS — S72001D Fracture of unspecified part of neck of right femur, subsequent encounter for closed fracture with routine healing: Secondary | ICD-10-CM | POA: Diagnosis not present

## 2022-12-17 DIAGNOSIS — R413 Other amnesia: Secondary | ICD-10-CM | POA: Diagnosis present

## 2022-12-17 DIAGNOSIS — Z86010 Personal history of colonic polyps: Secondary | ICD-10-CM

## 2022-12-17 LAB — CBC
HCT: 37.7 % — ABNORMAL LOW (ref 39.0–52.0)
Hemoglobin: 12.5 g/dL — ABNORMAL LOW (ref 13.0–17.0)
MCH: 32.1 pg (ref 26.0–34.0)
MCHC: 33.2 g/dL (ref 30.0–36.0)
MCV: 96.7 fL (ref 80.0–100.0)
Platelets: 131 10*3/uL — ABNORMAL LOW (ref 150–400)
RBC: 3.9 MIL/uL — ABNORMAL LOW (ref 4.22–5.81)
RDW: 13.7 % (ref 11.5–15.5)
WBC: 11 10*3/uL — ABNORMAL HIGH (ref 4.0–10.5)
nRBC: 0 % (ref 0.0–0.2)

## 2022-12-17 LAB — BASIC METABOLIC PANEL
Anion gap: 10 (ref 5–15)
BUN: 12 mg/dL (ref 8–23)
CO2: 26 mmol/L (ref 22–32)
Calcium: 8.7 mg/dL — ABNORMAL LOW (ref 8.9–10.3)
Chloride: 104 mmol/L (ref 98–111)
Creatinine, Ser: 0.92 mg/dL (ref 0.61–1.24)
GFR, Estimated: >60 mL/min (ref >60)
Glucose, Bld: 104 mg/dL — ABNORMAL HIGH (ref 70–99)
Potassium: 3.5 mmol/L (ref 3.5–5.1)
Sodium: 140 mmol/L (ref 135–145)

## 2022-12-17 MED ORDER — METHOCARBAMOL 500 MG PO TABS
500.0000 mg | ORAL_TABLET | Freq: Four times a day (QID) | ORAL | Status: DC | PRN
  Administered 2022-12-19 – 2022-12-20 (×3): 500 mg via ORAL
  Filled 2022-12-17 (×3): qty 1

## 2022-12-17 MED ORDER — HEPARIN SODIUM (PORCINE) 5000 UNIT/ML IJ SOLN
5000.0000 [IU] | Freq: Three times a day (TID) | INTRAMUSCULAR | Status: DC
  Administered 2022-12-18 – 2022-12-20 (×4): 5000 [IU] via SUBCUTANEOUS
  Filled 2022-12-17 (×4): qty 1

## 2022-12-17 MED ORDER — MORPHINE SULFATE (PF) 2 MG/ML IV SOLN
0.5000 mg | INTRAVENOUS | Status: DC | PRN
  Administered 2022-12-17: 0.5 mg via INTRAVENOUS
  Filled 2022-12-17: qty 1

## 2022-12-17 MED ORDER — DIPHENHYDRAMINE HCL 25 MG PO CAPS: 25.0000 mg | ORAL_CAPSULE | Freq: Four times a day (QID) | ORAL | Status: DC | PRN

## 2022-12-17 MED ORDER — POLYETHYLENE GLYCOL 3350 17 G PO PACK: 17.0000 g | PACK | Freq: Every day | ORAL | Status: DC | PRN

## 2022-12-17 MED ORDER — CLONAZEPAM 0.5 MG PO TABS: 0.5000 mg | ORAL_TABLET | Freq: Every day | ORAL | Status: DC | PRN

## 2022-12-17 MED ORDER — HYDROCODONE-ACETAMINOPHEN 5-325 MG PO TABS
1.0000 | ORAL_TABLET | Freq: Once | ORAL | Status: AC
  Administered 2022-12-17: 1 via ORAL
  Filled 2022-12-17: qty 1

## 2022-12-17 MED ORDER — ROSUVASTATIN CALCIUM 5 MG PO TABS
10.0000 mg | ORAL_TABLET | Freq: Every day | ORAL | Status: DC
  Administered 2022-12-17 – 2022-12-19 (×3): 10 mg via ORAL
  Filled 2022-12-17 (×3): qty 2

## 2022-12-17 MED ORDER — FINASTERIDE 5 MG PO TABS
5.0000 mg | ORAL_TABLET | Freq: Every morning | ORAL | Status: DC
  Administered 2022-12-18 – 2022-12-20 (×2): 5 mg via ORAL
  Filled 2022-12-17 (×2): qty 1

## 2022-12-17 MED ORDER — METHOCARBAMOL 1000 MG/10ML IJ SOLN: 500.0000 mg | Freq: Four times a day (QID) | INTRAVENOUS | Status: DC | PRN

## 2022-12-17 MED ORDER — DOXYLAMINE SUCCINATE (SLEEP) 25 MG PO TABS: 25.0000 mg | ORAL_TABLET | Freq: Every evening | ORAL | Status: DC | PRN

## 2022-12-17 MED ORDER — DONEPEZIL HCL 5 MG PO TABS
5.0000 mg | ORAL_TABLET | Freq: Every day | ORAL | Status: DC
  Administered 2022-12-18 – 2022-12-20 (×2): 5 mg via ORAL
  Filled 2022-12-17 (×3): qty 1

## 2022-12-17 MED ORDER — HEPARIN SODIUM (PORCINE) 5000 UNIT/ML IJ SOLN: 5000.0000 [IU] | Freq: Three times a day (TID) | INTRAMUSCULAR | Status: DC

## 2022-12-17 MED ORDER — HYDROCODONE-ACETAMINOPHEN 5-325 MG PO TABS
1.0000 | ORAL_TABLET | Freq: Four times a day (QID) | ORAL | Status: DC | PRN
  Administered 2022-12-18 – 2022-12-19 (×3): 1 via ORAL
  Administered 2022-12-19 – 2022-12-20 (×2): 2 via ORAL
  Filled 2022-12-17 (×2): qty 2
  Filled 2022-12-17 (×2): qty 1
  Filled 2022-12-17: qty 2

## 2022-12-17 MED ORDER — POTASSIUM CHLORIDE CRYS ER 20 MEQ PO TBCR
20.0000 meq | EXTENDED_RELEASE_TABLET | Freq: Four times a day (QID) | ORAL | Status: AC
  Administered 2022-12-17 (×2): 20 meq via ORAL
  Filled 2022-12-17 (×2): qty 1

## 2022-12-17 MED ORDER — TAMSULOSIN HCL 0.4 MG PO CAPS
0.4000 mg | ORAL_CAPSULE | Freq: Every day | ORAL | Status: DC
  Administered 2022-12-17 – 2022-12-19 (×3): 0.4 mg via ORAL
  Filled 2022-12-17 (×3): qty 1

## 2022-12-17 NOTE — H&P (Signed)
TRH H&P   Patient Demographics:    Cameron Wu, is a 72 y.o. male  MRN: [id number]   DOB - [birth year]  Admit Date - 12/17/2022  Outpatient Primary MD for the patient is Carylon Perches, MD  Referring MD/NP/PA: Dr Eloise Harman  Patient coming from: home  Chief Complaint  Patient presents with   Hip Pain      HPI:    Cameron Wu  is a 72 y.o. male, with past medical history of BPH, GERD, hairy cell leukemia in remission since 2008, IBS, lumbar radiculopathy, sleep apnea, bladder cancer that is post chemo, following with Dr. Retta Diones. -Patient presents to ED secondary to mechanical fall, and right hip pain, patient was playing pickle ball, he sustained mechanical fall, with immediate right hip pain, could not stand up, no head trauma, loss of consciousness, no dizziness, lightheadedness, not on any blood thinners, patient brought to ED by EMS. -In ED x-ray significant for right hip fracture, discussed with Dr. Aundria Rud at [facility], who recommended admission to [facility], n.p.o. after midnight for surgical repair tomorrow.    Review of systems:      A full 10 point Review of Systems was done, except as stated above, all other Review of Systems were negative.   With Past History of the following :    Past Medical History:  Diagnosis Date   Abnormal liver function tests 05/03/2011   Arthritis    Bladder cancer (HCC) 02/2021   high grade nonmuscle invasive bladder cancer   BPH (benign prostatic hypertrophy) 05/03/2011   Elevated transaminase level    GERD (gastroesophageal reflux disease)    Hairy cell leukemia, in remission (HCC) 1992   remission since 2008   IBS (irritable bowel syndrome)    Irritable bowel syndrome    2 yrs ago, states he had inflammation around anus. Biopsy: inclusive   Lumbar foraminal stenosis 12/31/2019   Lumbar radiculopathy 11/15/2019    Sleep apnea       Past Surgical History:  Procedure Laterality Date   BACK SURGERY     BONE MARROW BIOPSY     2007 and 1992   CATARACT EXTRACTION W/PHACO Right 08/15/2020   Procedure: CATARACT EXTRACTION PHACO AND INTRAOCULAR LENS PLACEMENT RIGHT EYE;  Surgeon: Fabio Pierce, MD;  Location: [facility];  Service: Ophthalmology;  Laterality: Right;  right CDE=7.75   COLONOSCOPY  04/19/2006   with polyps   COLONOSCOPY  09/30/2011   Procedure: COLONOSCOPY;  Surgeon: Malissa Hippo, MD;  Location: [facility];  Service: Endoscopy;  Laterality: N/A;  730   COLONOSCOPY N/A 01/11/2019   Procedure: COLONOSCOPY;  Surgeon: Malissa Hippo, MD;  Location: [facility];  Service: Endoscopy;  Laterality: N/A;  100-office move to 9:30am   CYSTOSCOPY W/ RETROGRADES Bilateral 02/23/2021   Procedure: CYSTOSCOPY WITH RETROGRADE PYELOGRAM;  Surgeon: Marcine Matar, MD;  Location: [facility];  Service: Urology;  Laterality: Bilateral;   CYSTOSCOPY W/ RETROGRADES Bilateral 07/05/2022   Procedure: CYSTOSCOPY WITH RETROGRADE PYELOGRAM;  Surgeon: Marcine Matar, MD;  Location: [facility];  Service: Urology;  Laterality: Bilateral;   CYSTOSCOPY WITH BIOPSY N/A 07/05/2022   Procedure: CYSTOSCOPY WITH BIOPSY;  Surgeon: Marcine Matar, MD;  Location: [facility];  Service: Urology;  Laterality: N/A;  30 MINS   HX COLON POLYPS     TONSILLECTOMY     childhood   TRANSFORAMINAL LUMBAR INTERBODY FUSION W/ MIS 1 LEVEL Right 11/18/2020   Procedure: Right Lumbar Five-Sacral One Minimally invasive transforaminal lumbar interbody fusion;  Surgeon: Jadene Pierini, MD;  Location: [facility];  Service: Neurosurgery;  Laterality: Right;   TRANSURETHRAL RESECTION OF BLADDER TUMOR N/A 03/26/2021   Procedure: REPEAT TRANSURETHRAL RESECTION OF BLADDER TUMOR (TURBT)/ POST OPERATIVE INSTILLATION OF GEMCITABINE;  Surgeon: Marcine Matar, MD;  Location: [facility];  Service: Urology;  Laterality: N/A;   TRANSURETHRAL RESECTION OF BLADDER TUMOR WITH MITOMYCIN-C N/A 02/23/2021   Procedure: TRANSURETHRAL RESECTION OF BLADDER TUMOR;  Surgeon: Marcine Matar, MD;  Location: [facility];  Service: Urology;  Laterality: N/A;   TRANSURETHRAL RESECTION OF BLADDER TUMOR WITH MITOMYCIN-C N/A 10/29/2021   Procedure: TRANSURETHRAL RESECTION OF BLADDER TUMOR WITH GEMCITABINE;  Surgeon: Marcine Matar, MD;  Location: [facility];  Service: Urology;  Laterality: N/A;      Social History:     Social History   Tobacco Use   Smoking status: Former    Current packs/day: 0.00    Types: Cigarettes    Quit date: 12/18/2005    Years since quitting: 17.0   Smokeless tobacco: Never  Substance Use Topics   Alcohol use: Yes    Alcohol/week: 4.0 standard drinks of alcohol    Types: 4 Cans of beer per week    Comment: social      Family History :    History reviewed. No pertinent family history.   Home Medications:   Prior to Admission medications   Medication Sig Start Date End Date Taking? Authorizing Provider  CHARCOAL ACTIVATED PO Take 520 mg by mouth as needed. Over week    [provider]  diphenhydrAMINE (BENADRYL) 25 mg capsule Take 25-50 mg by mouth every 6 (six) hours as needed for allergies or sleep.    [provider]  donepezil (ARICEPT) 10 MG tablet Take 5 mg by mouth daily.    [provider]  finasteride (PROSCAR) 5 MG tablet Take 1 tablet (5 mg total) by mouth every morning. 02/02/22   Marcine Matar, MD  Ginkgo Biloba (GINKOBA PO) Take 120 mg by mouth as needed. Stopped will restart after surgery    [provider]  ibuprofen (ADVIL,MOTRIN) 200 MG tablet Take 400 mg by mouth every 6 (six) hours as needed (Inflammation).    [provider]  Naphazoline-Glycerin-Zinc Sulf (CLEAR EYES SEASONAL RELIEF) 0.012-0.2-0.25 % SOLN Place 1 drop into both eyes daily as  needed (allergies).    [provider]  Omega-3 Fatty Acids (FISH OIL OMEGA-3 PO) Take 1,000 mg by mouth daily. 300 mg omega 3. Stopped on 03/13/21 for 03/26/21 surgery.    [provider]  phenazopyridine (PYRIDIUM) 200 MG tablet Take 1 tablet (200 mg total) by mouth 3 (three) times daily as needed for pain. 08/20/21   Summerlin, Regan Rakers, PA-C  psyllium (METAMUCIL) 58.6 % packet Take 1 packet by mouth as needed (constipation).  [provider]  rosuvastatin (CRESTOR) 10 MG tablet Take 10 mg by mouth at bedtime.    [provider]  tamsulosin (FLOMAX) 0.4 MG CAPS capsule Take 1 capsule (0.4 mg total) by mouth at bedtime. 02/02/22   Marcine Matar, MD     Allergies:     Allergies  Allergen Reactions   Penicillins Swelling    Did it involve swelling of the face/tongue/throat, SOB, or low BP? No Did it involve sudden or severe rash/hives, skin peeling, or any reaction on the inside of your mouth or nose? No Did you need to seek medical attention at a hospital or doctor's office? Was already inpatient at the time When did it last happen?      2005 If all above answers are "NO", may proceed with cephalosporin use.      Physical Exam:   Vitals  Blood pressure (!) 156/70, pulse 60, temperature 98 F (36.7 C), temperature source Oral, resp. rate 18, height 6' (1.829 m), weight 77.1 kg, SpO2 97%.   1. General Well Developed male, laying in bed, no apparent distress  2. Normal affect and insight, Not Suicidal or Homicidal, Awake Alert, Oriented X 3.  3. No F.N deficits, ALL C.Nerves Intact, Strength 5/5 all 4 extremities, Sensation intact all 4 extremities, Plantars down going.  4. Ears and Eyes appear Normal, Conjunctivae clear, PERRLA. Moist Oral Mucosa.  5. Supple Neck, No JVD, No cervical lymphadenopathy appriciated, No Carotid Bruits.  6. Symmetrical Chest wall movement, Good air movement bilaterally, CTAB.  7. RRR, No Gallops, Rubs  or Murmurs, No Parasternal Heave.  8. Positive Bowel Sounds, Abdomen Soft, No tenderness, No organomegaly appriciated,No rebound -guarding or rigidity.  9.  No Cyanosis, Normal Skin Turgor, No Skin Rash or Bruise.  10. Good muscle tone, right lower extremity ROM limited due to pain     Data Review:    CBC Recent Labs  Lab 12/17/22 1505  WBC 11.0*  HGB 12.5*  HCT 37.7*  PLT 131*  MCV 96.7  MCH 32.1  MCHC 33.2  RDW 13.7   ------------------------------------------------------------------------------------------------------------------  Chemistries  No results for input(s): "NA", "K", "CL", "CO2", "GLUCOSE", "BUN", "CREATININE", "CALCIUM", "MG", "AST", "ALT", "ALKPHOS", "BILITOT" in the last 168 hours.  Invalid input(s): "GFRCGP" ------------------------------------------------------------------------------------------------------------------ CrCl cannot be calculated (Patient's most recent lab result is older than the maximum 21 days allowed.). ------------------------------------------------------------------------------------------------------------------ No results for input(s): "TSH", "T4TOTAL", "T3FREE", "THYROIDAB" in the last 72 hours.  Invalid input(s): "FREET3"  Coagulation profile No results for input(s): "INR", "PROTIME" in the last 168 hours. ------------------------------------------------------------------------------------------------------------------- No results for input(s): "DDIMER" in the last 72 hours. -------------------------------------------------------------------------------------------------------------------  Cardiac Enzymes No results for input(s): "CKMB", "TROPONINI", "MYOGLOBIN" in the last 168 hours.  Invalid input(s): "CK" ------------------------------------------------------------------------------------------------------------------ No results found for:  "BNP"   ---------------------------------------------------------------------------------------------------------------  Urinalysis    Component Value Date/Time   APPEARANCEUR Clear 10/05/2022 1045   GLUCOSEU Negative 10/05/2022 1045   BILIRUBINUR Negative 10/05/2022 1045   PROTEINUR Negative 10/05/2022 1045   NITRITE Negative 10/05/2022 1045   LEUKOCYTESUR Negative 10/05/2022 1045    ----------------------------------------------------------------------------------------------------------------   Imaging Results:    DG Femur Min 2 Views Right  Result Date: 12/17/2022 CLINICAL DATA:  Right hip pain after fall. EXAM: RIGHT FEMUR 2 VIEWS COMPARISON:  None Available. FINDINGS: Mildly displaced proximal right femoral neck fracture is noted. The middle and distal portions of the femur are unremarkable. IMPRESSION: Mildly displaced proximal right femoral neck fracture. Electronically Signed   By: Zenda Alpers.D.  On: 12/17/2022 14:59   DG Pelvis 1-2 Views  Result Date: 12/17/2022 CLINICAL DATA:  Right hip pain after fall. EXAM: PELVIS - 1-2 VIEW COMPARISON:  None Available. FINDINGS: Mildly displaced proximal right femoral neck fracture is noted. Left hip is unremarkable. Postsurgical changes are noted in lower lumbar spine. IMPRESSION: Mildly displaced proximal right femoral neck fracture. Electronically Signed   By: Lupita Raider M.D.   On: 12/17/2022 14:58    EKG pending   Assessment & Plan:    Principal Problem:   Closed right hip fracture [facility]) Active Problems:   Benign prostatic hyperplasia   Lumbar radiculopathy    Right hip fracture -Secondary to chemical fall, no, no loss of consciousness, no head trauma -ED discussed with Dr. Aundria Rud, recommend admission to [facility] for OR tomorrow, n.p.o. after midnight -Admitted under hip fracture pathway, continue with as needed pain regimen, - heparin to start postoperatively tomorrow, this can be adjusted by  Ortho as needed  Short-term memory problems -Continue with Aricept  BPH -continue with home medications  Hyperlipidemia -Continue with statin  Hx of Hairy cell leukemia in remission, Hx of Bladder cancer, status post  chemo, he is following with Dr. Retta Diones  DVT Prophylaxis Heparin   AM Labs Ordered, also please review Full Orders  Family Communication: Admission, patients condition and plan of care including tests being ordered have been discussed with the patient and wife who indicate understanding and agree with the plan and Code Status.  Code Status full  Likely DC to  likely SNF  Condition GUARDED    Consults called: ortho dr Aundria Rud by ER    Admission status: inpatient    Time spent in minutes : 60 minutes   Huey Bienenstock M.D on 12/17/2022 at 3:30 PM   [facility] - Office  [phone]

## 2022-12-17 NOTE — Progress Notes (Signed)
Patient discussed with EDP at [facility].  Unstable right hip intracapsular femoral neck fracture.  Best treated with total hip arthroplasty given age and activity level.  Recommend transfer down to [facility].  Dr. Danton Sewer to assume care over the weekend with total hip arthroplasty planned for Sunday morning.

## 2022-12-17 NOTE — ED Provider Notes (Signed)
[place] EMERGENCY DEPARTMENT AT [facility] Provider Note   CSN: [id number] Arrival date & time: 12/17/22  1323     History  Chief Complaint  Patient presents with   Hip Pain    Cameron Wu is a 72 y.o. male.  72 year old male with history of hairy cell leukemia and bladder cancer who presents emergency department with right hip pain.  Patient was playing pickle ball when he fell onto his right hip.  Says that he has been unable to bear weight since.  No numbness or tingling down his legs.  No head strike or LOC.  Thinks he may have scraped his elbow but denies any additional injuries.         Home Medications Prior to Admission medications   Medication Sig Start Date End Date Taking? Authorizing Provider  clonazePAM (KLONOPIN) 0.5 MG tablet Take 0.5 mg by mouth daily as needed. 11/22/22  Yes [provider]  diphenhydrAMINE (BENADRYL) 25 mg capsule Take 25-50 mg by mouth every 6 (six) hours as needed for allergies or sleep.   Yes [provider]  donepezil (ARICEPT) 10 MG tablet Take 5 mg by mouth daily.   Yes [provider]  doxylamine, Sleep, (UNISOM) 25 MG tablet Take 25 mg by mouth at bedtime as needed for sleep.   Yes [provider]  finasteride (PROSCAR) 5 MG tablet Take 1 tablet (5 mg total) by mouth every morning. Patient taking differently: Take 5 mg by mouth every other day. 02/02/22  Yes Dahlstedt, Jeannett Senior, MD  Ginkgo Biloba (GINKOBA PO) Take 120 mg by mouth daily. Stopped will restart after surgery   Yes [provider]  Homeopathic Products (LEG CRAMPS PM SL) Place 2 tablets under the tongue daily as needed (leg cramps).   Yes [provider]  ibuprofen (ADVIL,MOTRIN) 200 MG tablet Take 400 mg by mouth every 6 (six) hours as needed (Inflammation).   Yes [provider]  levocetirizine (XYZAL) 5 MG tablet Take 5 mg by mouth at bedtime as needed for allergies.   Yes [provider]  loperamide (IMODIUM) 2 MG capsule Take 2 mg by mouth as needed for diarrhea or loose stools.   Yes [provider]  Multiple Vitamins-Minerals (ONE-A-DAY MENS 50+) TABS Take 1 tablet by mouth daily.   Yes [provider]  Naphazoline-Glycerin-Zinc Sulf (CLEAR EYES SEASONAL RELIEF) 0.012-0.2-0.25 % SOLN Place 1 drop into both eyes daily as needed (allergies).   Yes [provider]  rosuvastatin (CRESTOR) 10 MG tablet Take 10 mg by mouth at bedtime.   Yes [provider]  simethicone (MYLICON) 125 MG chewable tablet Chew 125 mg by mouth 2 (two) times daily as needed for flatulence.   Yes [provider]  tamsulosin (FLOMAX) 0.4 MG CAPS capsule Take 1 capsule (0.4 mg total) by mouth at bedtime. Patient taking differently: Take 0.4 mg by mouth every other day. 02/02/22  Yes Dahlstedt, Jeannett Senior, MD      Allergies    Penicillins and Pollen extract    Review of Systems   Review of Systems  Physical Exam Updated Vital Signs BP (!) 142/66   Pulse 70   Temp 98.2 F (36.8 C) (Oral)   Resp 17   Ht 6' (1.829 m)   Wt 77.1 kg   SpO2 99%   BMI 23.06 kg/m  Physical Exam Vitals and nursing note reviewed.  Constitutional:      General: He is not in acute distress.  Appearance: Normal appearance. He is well-developed. He is not ill-appearing.  HENT:     Head: Normocephalic and atraumatic.     Right Ear: External ear normal.     Left Ear: External ear normal.     Nose: Nose normal.     Mouth/Throat:     Mouth: Mucous membranes are moist.     Pharynx: Oropharynx is clear.  Eyes:     Extraocular Movements: Extraocular movements intact.     Conjunctiva/sclera: Conjunctivae normal.     Pupils: Pupils are equal, round, and reactive to light.  Neck:     Comments: No C-spine midline tenderness to palpation Cardiovascular:     Rate and Rhythm: Normal rate and regular rhythm.     Pulses: Normal pulses.     Heart sounds: Normal heart sounds.   Pulmonary:     Effort: Pulmonary effort is normal. No respiratory distress.     Breath sounds: Normal breath sounds.  Abdominal:     General: Abdomen is flat. There is no distension.     Palpations: Abdomen is soft. There is no mass.     Tenderness: There is no abdominal tenderness. There is no guarding.  Musculoskeletal:     Cervical back: Normal range of motion and neck supple. No rigidity or tenderness.     Right lower leg: No edema.     Left lower leg: No edema.     Comments: Tenderness to palpation of right proximal femur.  DP pulses 2+ bilaterally.  Able to wiggle all 10 toes.  Intact sensation to light touch of both lower legs and feet.  Skin:    General: Skin is warm and dry.  Neurological:     Mental Status: He is alert and oriented to person, place, and time. Mental status is at baseline.     ED Results / Procedures / Treatments   Labs (all labs ordered are listed, but only abnormal results are displayed) Labs Reviewed  CBC - Abnormal; Notable for the following components:      Result Value   WBC 11.0 (*)    RBC 3.90 (*)    Hemoglobin 12.5 (*)    HCT 37.7 (*)    Platelets 131 (*)    All other components within normal limits  BASIC METABOLIC PANEL - Abnormal; Notable for the following components:   Glucose, Bld 104 (*)    Calcium 8.7 (*)    All other components within normal limits  CBC  BASIC METABOLIC PANEL    EKG None  Radiology Chest Portable 1 View  Result Date: 12/17/2022 CLINICAL DATA:  Preoperative chest examination. Patient fell today while playing pickle ball with pain and injury to the right hip. Right hip fracture. EXAM: PORTABLE CHEST 1 VIEW COMPARISON:  None Available. FINDINGS: Heart size and pulmonary vascularity are normal. Lungs are clear. No pleural effusions. No pneumothorax. Mediastinal contours appear intact. IMPRESSION: No active disease. Electronically Signed   By: Burman Nieves M.D.   On: 12/17/2022 16:35   DG Femur Min 2 Views  Right  Result Date: 12/17/2022 CLINICAL DATA:  Right hip pain after fall. EXAM: RIGHT FEMUR 2 VIEWS COMPARISON:  None Available. FINDINGS: Mildly displaced proximal right femoral neck fracture is noted. The middle and distal portions of the femur are unremarkable. IMPRESSION: Mildly displaced proximal right femoral neck fracture. Electronically Signed   By: Lupita Raider M.D.   On: 12/17/2022 14:59   DG Pelvis 1-2 Views  Result Date: 12/17/2022 CLINICAL DATA:  Right hip pain after fall. EXAM: PELVIS - 1-2 VIEW COMPARISON:  None Available. FINDINGS: Mildly displaced proximal right femoral neck fracture is noted. Left hip is unremarkable. Postsurgical changes are noted in lower lumbar spine. IMPRESSION: Mildly displaced proximal right femoral neck fracture. Electronically Signed   By: Lupita Raider M.D.   On: 12/17/2022 14:58    Procedures Procedures    Medications Ordered in ED Medications  HYDROcodone-acetaminophen (NORCO/VICODIN) 5-325 MG per tablet 1-2 tablet (has no administration in time range)  morphine (PF) 2 MG/ML injection 0.5 mg (has no administration in time range)  methocarbamol (ROBAXIN) tablet 500 mg (has no administration in time range)    Or  methocarbamol (ROBAXIN) 500 mg in dextrose 5 % 50 mL IVPB (has no administration in time range)  polyethylene glycol (MIRALAX / GLYCOLAX) packet 17 g (has no administration in time range)  potassium chloride SA (KLOR-CON M) CR tablet 20 mEq (20 mEq Oral Given 12/17/22 1716)  heparin injection 5,000 Units (has no administration in time range)  HYDROcodone-acetaminophen (NORCO/VICODIN) 5-325 MG per tablet 1 tablet (1 tablet Oral Given 12/17/22 1520)    ED Course/ Medical Decision Making/ A&P Clinical Course as of 12/17/22 1807  Fri Dec 17, 2022  1508 Dr Aundria Rud from orthopedics consulted.  Recommends transfer to [facility]. [RP]  [phone] Dr Florene Route from hospitalist to admit the patient [RP]    Clinical Course User Index [RP]  Rondel Baton, MD                                 Medical Decision Making Amount and/or Complexity of Data Reviewed Labs: ordered. Radiology: ordered.  Risk Prescription drug management. Decision regarding hospitalization.   Cameron Wu is a 72 y.o. male with comorbidities that complicate the patient evaluation including hairy cell leukemia and bladder cancer who presented to the emergency department with hip pain after a fall  Initial Ddx:  Hip fracture, lumbar spine fracture, mechanical fall, syncopal episode  MDM:  Feel the patient likely has a hip fracture given his fall.  Will obtain an x-ray of the hip.  No signs of lumbar spine fracture tenderness to palpation on exam that would warrant spinal imaging.  Does appear to be consistent with mechanical fall instead of a syncopal episode.  Plan:  Labs Right hip x-ray Pain medication  ED Summary/Re-evaluation:  Patient was found to have a hip fracture on x-rays.  Given his age will be admitted to medicine for further management and therapy.  Orthopedics was consulted regarding operative repair who is planning to take to the OR at [facility].  This patient presents to the ED for concern of complaints listed in HPI, this involves an extensive number of treatment options, and is a complaint that carries with it a high risk of complications and morbidity. Disposition including potential need for admission considered.   Dispo: Admit to Floor  Additional history obtained from spouse Records reviewed Outpatient Clinic Notes The following labs were independently interpreted: Chemistry and show no acute abnormality I independently reviewed the following imaging with scope of interpretation limited to determining acute life threatening conditions related to emergency care: Extremity x-ray(s) and agree with the radiologist interpretation with the following exceptions: none I have reviewed the patients home medications and made  adjustments as needed Consults: Hospitalist and Orthopedics Social Determinants of health:  Elderly        Final Clinical Impression(s) /  ED Diagnoses Final diagnoses:  Closed fracture of right hip, initial encounter [facility])  Fall, initial encounter    Rx / DC Orders ED Discharge Orders     None         Rondel Baton, MD 12/17/22 1807

## 2022-12-17 NOTE — Progress Notes (Signed)
Orthopedic Tech Progress Note Patient Details:  Cameron Wu [birth year] [id number]  Patient ID: [name], male   DOB: 1951/01/27, 72 y.o.   MRN: [id number] Patient doesn't meet the criteria for ohf. Patient must be under 70 to get ohf. Cameron Wu 12/17/2022, 6:47 PM

## 2022-12-17 NOTE — Plan of Care (Signed)

## 2022-12-17 NOTE — ED Triage Notes (Signed)
"  Larey Seat today playing pickle ball today and fell and hurt right hip. Unable to ambulate due to pain" per pt  EMS gave of fentanyl in route

## 2022-12-17 NOTE — Progress Notes (Signed)
As I discussed with Dr. Aundria Rud, patient is going to have surgery on Sunday with Dr. Garner Gavel so he can eat this evening, he should be n.p.o. after midnight tomorrow. Cameron Bienenstock MD

## 2022-12-18 DIAGNOSIS — S72001D Fracture of unspecified part of neck of right femur, subsequent encounter for closed fracture with routine healing: Secondary | ICD-10-CM

## 2022-12-18 LAB — CBC
HCT: 36.8 % — ABNORMAL LOW (ref 39.0–52.0)
Hemoglobin: 12.1 g/dL — ABNORMAL LOW (ref 13.0–17.0)
MCH: 30.8 pg (ref 26.0–34.0)
MCHC: 32.9 g/dL (ref 30.0–36.0)
MCV: 93.6 fL (ref 80.0–100.0)
Platelets: 114 10*3/uL — ABNORMAL LOW (ref 150–400)
RBC: 3.93 MIL/uL — ABNORMAL LOW (ref 4.22–5.81)
RDW: 13.6 % (ref 11.5–15.5)
WBC: 9.5 10*3/uL (ref 4.0–10.5)
nRBC: 0 % (ref 0.0–0.2)

## 2022-12-18 LAB — BASIC METABOLIC PANEL
Anion gap: 8 (ref 5–15)
BUN: 11 mg/dL (ref 8–23)
CO2: 26 mmol/L (ref 22–32)
Calcium: 8.7 mg/dL — ABNORMAL LOW (ref 8.9–10.3)
Chloride: 105 mmol/L (ref 98–111)
Creatinine, Ser: 1.07 mg/dL (ref 0.61–1.24)
GFR, Estimated: >60 mL/min (ref >60)
Glucose, Bld: 126 mg/dL — ABNORMAL HIGH (ref 70–99)
Potassium: 3.7 mmol/L (ref 3.5–5.1)
Sodium: 139 mmol/L (ref 135–145)

## 2022-12-18 LAB — VITAMIN D 25 HYDROXY (VIT D DEFICIENCY, FRACTURES): Vit D, 25-Hydroxy: 30.36 ng/mL (ref 30–100)

## 2022-12-18 MED ORDER — IPRATROPIUM-ALBUTEROL 0.5-2.5 (3) MG/3ML IN SOLN: 3.0000 mL | RESPIRATORY_TRACT | Status: DC | PRN

## 2022-12-18 MED ORDER — SENNOSIDES-DOCUSATE SODIUM 8.6-50 MG PO TABS
1.0000 | ORAL_TABLET | Freq: Every evening | ORAL | Status: DC | PRN
  Filled 2022-12-18: qty 1

## 2022-12-18 MED ORDER — TRAZODONE HCL 50 MG PO TABS: 50.0000 mg | ORAL_TABLET | Freq: Every evening | ORAL | Status: DC | PRN

## 2022-12-18 MED ORDER — OXYCODONE HCL 5 MG PO TABS
5.0000 mg | ORAL_TABLET | ORAL | Status: DC | PRN
  Administered 2022-12-20: 5 mg via ORAL
  Filled 2022-12-18: qty 1

## 2022-12-18 MED ORDER — HYDRALAZINE HCL 20 MG/ML IJ SOLN: 10.0000 mg | INTRAMUSCULAR | Status: DC | PRN

## 2022-12-18 MED ORDER — ENSURE ENLIVE PO LIQD
237.0000 mL | ORAL | Status: DC
  Administered 2022-12-18: 237 mL via ORAL

## 2022-12-18 MED ORDER — CHLORHEXIDINE GLUCONATE 4 % EX SOLN
60.0000 mL | Freq: Once | CUTANEOUS | Status: AC
  Administered 2022-12-19: 4 via TOPICAL
  Filled 2022-12-18: qty 15

## 2022-12-18 MED ORDER — ONDANSETRON HCL 4 MG/2ML IJ SOLN: 4.0000 mg | Freq: Four times a day (QID) | INTRAMUSCULAR | Status: DC | PRN

## 2022-12-18 MED ORDER — GUAIFENESIN 100 MG/5ML PO LIQD: 5.0000 mL | ORAL | Status: DC | PRN

## 2022-12-18 MED ORDER — METOPROLOL TARTRATE 5 MG/5ML IV SOLN: 5.0000 mg | INTRAVENOUS | Status: DC | PRN

## 2022-12-18 NOTE — Consult Note (Signed)
ORTHOPAEDIC CONSULTATION  REQUESTING PHYSICIAN: Miguel Rota, MD  Chief Complaint: Fall  HPI: Cameron Wu is a 72 y.o. male who complains of right hip pain after a fall.  Yesterday he was playing pickle ball and tripped falling directly on his right side.  He had severe pain and inability to ambulate.  He was brought to [facility] but where x-rays were taken showing a right femoral neck fracture.  Today he states pain is mild at rest but severe with any movement is located at the right groin.  Does not radiate.  Denies paresthesias.  Pain is worse with movement and better with rest and pain medication. He is not on a blood thinner. Does not walk with an assistive device at baseline.  He is very active and wants to return to as much activity as possible after this injury.  Past Medical History:  Diagnosis Date   Abnormal liver function tests 05/03/2011   Arthritis    Bladder cancer (HCC) 02/2021   high grade nonmuscle invasive bladder cancer   BPH (benign prostatic hypertrophy) 05/03/2011   Elevated transaminase level    GERD (gastroesophageal reflux disease)    Hairy cell leukemia, in remission (HCC) 1992   remission since 2008   IBS (irritable bowel syndrome)    Irritable bowel syndrome    2 yrs ago, states he had inflammation around anus. Biopsy: inclusive   Lumbar foraminal stenosis 12/31/2019   Lumbar radiculopathy 11/15/2019   Sleep apnea    Past Surgical History:  Procedure Laterality Date   BACK SURGERY     BONE MARROW BIOPSY     2007 and 1992   CATARACT EXTRACTION W/PHACO Right 08/15/2020   Procedure: CATARACT EXTRACTION PHACO AND INTRAOCULAR LENS PLACEMENT RIGHT EYE;  Surgeon: Fabio Pierce, MD;  Location: [facility];  Service: Ophthalmology;  Laterality: Right;  right CDE=7.75   COLONOSCOPY  04/19/2006   with polyps   COLONOSCOPY  09/30/2011   Procedure: COLONOSCOPY;  Surgeon: Malissa Hippo, MD;  [facility];  Service: Endoscopy;  Laterality: N/A;   730   COLONOSCOPY N/A 01/11/2019   Procedure: COLONOSCOPY;  Surgeon: Malissa Hippo, MD;  [facility];  Service: Endoscopy;  Laterality: N/A;  100-office move to 9:30am   CYSTOSCOPY W/ RETROGRADES Bilateral 02/23/2021   Procedure: CYSTOSCOPY WITH RETROGRADE PYELOGRAM;  Surgeon: Marcine Matar, MD;  Location: [facility];  Service: Urology;  Laterality: Bilateral;   CYSTOSCOPY W/ RETROGRADES Bilateral 07/05/2022   Procedure: CYSTOSCOPY WITH RETROGRADE PYELOGRAM;  Surgeon: Marcine Matar, MD;  Location: [facility];  Service: Urology;  Laterality: Bilateral;   CYSTOSCOPY WITH BIOPSY N/A 07/05/2022   Procedure: CYSTOSCOPY WITH BIOPSY;  Surgeon: Marcine Matar, MD;  Location: [facility];  Service: Urology;  Laterality: N/A;  30 MINS   HX COLON POLYPS     TONSILLECTOMY     childhood   TRANSFORAMINAL LUMBAR INTERBODY FUSION W/ MIS 1 LEVEL Right 11/18/2020   Procedure: Right Lumbar Five-Sacral One Minimally invasive transforaminal lumbar interbody fusion;  Surgeon: Jadene Pierini, MD;  Location: [facility];  Service: Neurosurgery;  Laterality: Right;   TRANSURETHRAL RESECTION OF BLADDER TUMOR N/A 03/26/2021   Procedure: REPEAT TRANSURETHRAL RESECTION OF BLADDER TUMOR (TURBT)/ POST OPERATIVE INSTILLATION OF GEMCITABINE;  Surgeon: Marcine Matar, MD;  Location: [facility];  Service: Urology;  Laterality: N/A;   TRANSURETHRAL RESECTION OF BLADDER TUMOR WITH MITOMYCIN-C N/A 02/23/2021   Procedure: TRANSURETHRAL RESECTION OF BLADDER TUMOR;  Surgeon: Marcine Matar, MD;  Location: [facility];  Service: Urology;  Laterality: N/A;   TRANSURETHRAL RESECTION OF BLADDER TUMOR WITH MITOMYCIN-C N/A 10/29/2021   Procedure: TRANSURETHRAL RESECTION OF BLADDER TUMOR WITH GEMCITABINE;  Surgeon: Marcine Matar, MD;  Location: [facility];  Service: Urology;  Laterality: N/A;   Social History    Socioeconomic History   Marital status: Married    Spouse name: Not on file   Number of children: Not on file   Years of education: Not on file   Highest education level: Not on file  Occupational History   Not on file  Tobacco Use   Smoking status: Former    Current packs/day: 0.00    Types: Cigarettes    Quit date: 12/18/2005    Years since quitting: 17.0   Smokeless tobacco: Never  Vaping Use   Vaping status: Never Used  Substance and Sexual Activity   Alcohol use: Yes    Alcohol/week: 4.0 standard drinks of alcohol    Types: 4 Cans of beer per week    Comment: social   Drug use: No   Sexual activity: Not on file  Other Topics Concern   Not on file  Social History Narrative   Not on file   Social Determinants of Health   Financial Resource Strain: Not on file  Food Insecurity: No Food Insecurity (12/17/2022)   Hunger Vital Sign    Worried About Running Out of Food in the Last Year: Never true    Ran Out of Food in the Last Year: Never true  Transportation Needs: No Transportation Needs (12/17/2022)   PRAPARE - Administrator, Civil Service (Medical): No    Lack of Transportation (Non-Medical): No  Physical Activity: Not on file  Stress: Not on file  Social Connections: Not on file   History reviewed. No pertinent family history. Allergies  Allergen Reactions   Penicillins Swelling and Other (See Comments)   Pollen Extract      Positive ROS: All other systems have been reviewed and were otherwise negative with the exception of those mentioned in the HPI and as above.  Physical Exam: General: Alert, no acute distress Cardiovascular: No pedal edema Respiratory: No cyanosis, no use of accessory musculature GI: No organomegaly, abdomen is soft and non-tender Skin: No lesions in the area of chief complaint Neurologic: Sensation intact distally Psychiatric: Patient is competent for consent with normal mood and affect Lymphatic: No axillary or  cervical lymphadenopathy  MUSCULOSKELETAL: RLE shortened. Dorsiflexion and plantarflexion intact. Able to flex and extend all toes. 2+ DP and PT pulses. Sensation intact diffusely to right foot.   Assessment/Plan: Right femoral neck fracture - had long discussion with patient this morning regarding injury, surgical plan, and anticipated recovery. He is very active, he did this playing pickleball, and would like to get back to as much activity as possible. Discussed non-operative vs. Operative treatment, he has elected for operative treatment. - plan for right total hip replacement for fracture with Dr. Blanchie Dessert tomorrow morning. Please keep NPO after midnight. - today patient will be NWB RLE, continue current pain control plan.    Armida Sans, PA-C   12/18/2022 9:00 AM

## 2022-12-18 NOTE — Progress Notes (Signed)
PROGRESS NOTE    KANNAN PAMINTUAN  JWJ:[id number] DOB: May 16, 1950 DOA: 12/17/2022 PCP: Carylon Perches, MD   Brief Narrative:  72 year old with history of BPH, GERD, hairy cell leukemia in remission since 2008, IBS, lumbar radiculopathy, sleep apnea, bladder cancer status post chemo following Dr. Retta Diones comes to the ED with mechanical fall while playing pickle ball followed by right hip pain.  X-ray in the ER showed right hip fracture, patient was transferred to [facility] from [facility] at orthopedic request for surgical intervention.   Assessment & Plan:  Principal Problem:   Closed right hip fracture [facility]) Active Problems:   Benign prostatic hyperplasia   Lumbar radiculopathy   Right hip fracture; closed.  Secondary to mechanical fall.  Orthopedic consulted.  Planning on ORIF.  Defer the timing to their service.  Postop wound care, weightbearing, DVT prophylaxis and pain management per orthopedic service.  Short-term memory problems -Continue with Aricept   BPH -continue with home medications   Hyperlipidemia -Continue with statin   Hx of Hairy cell leukemia in remission, Hx of Bladder cancer, status post  chemo, he is following with Dr. Retta Diones     DVT prophylaxis: heparin injection 5,000 Units Start: 12/18/22 2200 SCDs Start: 12/17/22 1552 Code Status: Full code Family Communication:  family at bedside Status is: Inpatient Remains inpatient appropriate because: Continue hospital stay as patient will require ORIF    Subjective: No complaints Doing ok.    Examination:  General exam: Appears calm and comfortable  Respiratory system: Clear to auscultation. Respiratory effort normal. Cardiovascular system: S1 & S2 heard, RRR. No JVD, murmurs, rubs, gallops or clicks. No pedal edema. Gastrointestinal system: Abdomen is nondistended, soft and nontender. No organomegaly or masses felt. Normal bowel sounds heard. Central nervous system: Alert and oriented.  No focal neurological deficits. Extremities: Symmetric 5 x 5 power. Limited ROM of LE Skin: No rashes, lesions or ulcers Psychiatry: Judgement and insight appear normal. Mood & affect appropriate.      Diet Orders (From admission, onward)     Start     Ordered   12/19/22 0001  Diet NPO time specified  Diet effective midnight        12/17/22 1626   12/17/22 1553  Diet regular Room service appropriate? Yes; Fluid consistency: Thin  Diet effective now       Question Answer Comment  Room service appropriate? Yes   Fluid consistency: Thin      12/17/22 1552            Objective: Vitals:   12/17/22 1829 12/17/22 1957 12/18/22 0539 12/18/22 0729  BP: (!) 156/78 (!) 142/78 133/66 137/83  Pulse: 61 62 68 67  Resp: 17 18 18 17   Temp: 98.6 F (37 C) 98.7 F (37.1 C) 98.8 F (37.1 C) 98.7 F (37.1 C)  TempSrc: Oral Oral Oral Oral  SpO2: 99% 94% 90% 95%  Weight:      Height:       No intake or output data in the 24 hours ending 12/18/22 0832 Filed Weights   12/17/22 1406  Weight: 77.1 kg    Scheduled Meds:  donepezil  5 mg Oral Daily   finasteride  5 mg Oral q morning   heparin  5,000 Units Subcutaneous Q8H   rosuvastatin  10 mg Oral QHS   tamsulosin  0.4 mg Oral QHS   Continuous Infusions:  methocarbamol (ROBAXIN) IV      Nutritional status     Body mass index  is 23.06 kg/m.  Data Reviewed:   CBC: Recent Labs  Lab 12/17/22 1505  WBC 11.0*  HGB 12.5*  HCT 37.7*  MCV 96.7  PLT 131*   Basic Metabolic Panel: Recent Labs  Lab 12/17/22 1505  NA 140  K 3.5  CL 104  CO2 26  GLUCOSE 104*  BUN 12  CREATININE 0.92  CALCIUM 8.7*   GFR: Estimated Creatinine Clearance: 79.1 mL/min (by C-G formula based on SCr of 0.92 mg/dL). Liver Function Tests: No results for input(s): "AST", "ALT", "ALKPHOS", "BILITOT", "PROT", "ALBUMIN" in the last 168 hours. No results for input(s): "LIPASE", "AMYLASE" in the last 168 hours. No results for input(s): "AMMONIA"  in the last 168 hours. Coagulation Profile: No results for input(s): "INR", "PROTIME" in the last 168 hours. Cardiac Enzymes: No results for input(s): "CKTOTAL", "CKMB", "CKMBINDEX", "TROPONINI" in the last 168 hours. BNP (last 3 results) No results for input(s): "PROBNP" in the last 8760 hours. HbA1C: No results for input(s): "HGBA1C" in the last 72 hours. CBG: No results for input(s): "GLUCAP" in the last 168 hours. Lipid Profile: No results for input(s): "CHOL", "HDL", "LDLCALC", "TRIG", "CHOLHDL", "LDLDIRECT" in the last 72 hours. Thyroid Function Tests: No results for input(s): "TSH", "T4TOTAL", "FREET4", "T3FREE", "THYROIDAB" in the last 72 hours. Anemia Panel: No results for input(s): "VITAMINB12", "FOLATE", "FERRITIN", "TIBC", "IRON", "RETICCTPCT" in the last 72 hours. Sepsis Labs: No results for input(s): "PROCALCITON", "LATICACIDVEN" in the last 168 hours.  No results found for this or any previous visit (from the past 240 hour(s)).       Radiology Studies: Chest Portable 1 View  Result Date: 12/17/2022 CLINICAL DATA:  Preoperative chest examination. Patient fell today while playing pickle ball with pain and injury to the right hip. Right hip fracture. EXAM: PORTABLE CHEST 1 VIEW COMPARISON:  None Available. FINDINGS: Heart size and pulmonary vascularity are normal. Lungs are clear. No pleural effusions. No pneumothorax. Mediastinal contours appear intact. IMPRESSION: No active disease. Electronically Signed   By: Burman Nieves M.D.   On: 12/17/2022 16:35   DG Femur Min 2 Views Right  Result Date: 12/17/2022 CLINICAL DATA:  Right hip pain after fall. EXAM: RIGHT FEMUR 2 VIEWS COMPARISON:  None Available. FINDINGS: Mildly displaced proximal right femoral neck fracture is noted. The middle and distal portions of the femur are unremarkable. IMPRESSION: Mildly displaced proximal right femoral neck fracture. Electronically Signed   By: Lupita Raider M.D.   On: 12/17/2022  14:59   DG Pelvis 1-2 Views  Result Date: 12/17/2022 CLINICAL DATA:  Right hip pain after fall. EXAM: PELVIS - 1-2 VIEW COMPARISON:  None Available. FINDINGS: Mildly displaced proximal right femoral neck fracture is noted. Left hip is unremarkable. Postsurgical changes are noted in lower lumbar spine. IMPRESSION: Mildly displaced proximal right femoral neck fracture. Electronically Signed   By: Lupita Raider M.D.   On: 12/17/2022 14:58           LOS: 1 day   Time spent= 35 mins    Miguel Rota, MD [facility]  If 7PM-7AM, please contact night-coverage  12/18/2022, 8:32 AM

## 2022-12-18 NOTE — Plan of Care (Signed)
  Problem: Pain Managment: Goal: General experience of comfort will improve Outcome: Progressing   

## 2022-12-18 NOTE — Progress Notes (Signed)
Initial Nutrition Assessment  DOCUMENTATION CODES:   Not applicable  INTERVENTION:  Continue regular diet as ordered Ensure Enlive po once daily, each supplement provides 350 kcal and 20 grams of protein. Monitor for nutritional adequacy following total hip replacement  NUTRITION DIAGNOSIS:   Increased nutrient needs related to post-op healing, hip fracture as evidenced by estimated needs.  GOAL:   Patient will meet greater than or equal to 90% of their needs  MONITOR:   PO intake, Labs, Weight trends  REASON FOR ASSESSMENT:   Consult Hip fracture protocol  ASSESSMENT:   Pt admitted after a fall while playing pickle ball leading to R femoral neck fracture. PMH significant for BPH, GERD, hairy cell leukemia in remission since 2008, IBS, lumbar radiculopathy, bladder cancer s/p chemo.  Plans for R total hip replacement tomorrow. 9/1.   RD working remotely. Unsuccessful attempt to reach pt via phone call to room. Unable to obtain detailed nutrition related history.   Per review of MD documentation, pt reports typically being very active.   Meal completions: 8/31: 100% breakfast  Review of chart reflects pt's weight has remained fairly stable over the last year (76-77kg).   In the setting of hip fracture and imminent surgery tomorrow, pt would benefit from a protein supplement to support post op healing. Will order and monitor for tolerance.    Medications and labs: reviewed  NUTRITION - FOCUSED PHYSICAL EXAM: RD working remotely. Deferred to follow up.   Diet Order:   Diet Order             Diet NPO time specified  Diet effective midnight           Diet regular Room service appropriate? Yes; Fluid consistency: Thin  Diet effective now                   EDUCATION NEEDS:   No education needs have been identified at this time  Skin:  Skin Assessment: Reviewed RN Assessment  Last BM:  8/30  Height:   Ht Readings from Last 1 Encounters:  12/17/22 6'  (1.829 m)    Weight:   Wt Readings from Last 1 Encounters:  12/17/22 77.1 kg   BMI:  Body mass index is 23.06 kg/m.  Estimated Nutritional Needs:   Kcal:  1900-2100  Protein:  95-105g  Fluid:  >/=1.9L  Drusilla Kanner, RDN, LDN Clinical Nutrition

## 2022-12-18 NOTE — H&P (View-Only) (Signed)
ORTHOPAEDIC CONSULTATION  REQUESTING PHYSICIAN: Miguel Rota, MD  Chief Complaint: Fall  HPI: Cameron Wu is a 72 y.o. male who complains of right hip pain after a fall.  Yesterday he was playing pickle ball and tripped falling directly on his right side.  He had severe pain and inability to ambulate.  He was brought to [facility] but where x-rays were taken showing a right femoral neck fracture.  Today he states pain is mild at rest but severe with any movement is located at the right groin.  Does not radiate.  Denies paresthesias.  Pain is worse with movement and better with rest and pain medication. He is not on a blood thinner. Does not walk with an assistive device at baseline.  He is very active and wants to return to as much activity as possible after this injury.  Past Medical History:  Diagnosis Date   Abnormal liver function tests 05/03/2011   Arthritis    Bladder cancer (HCC) 02/2021   high grade nonmuscle invasive bladder cancer   BPH (benign prostatic hypertrophy) 05/03/2011   Elevated transaminase level    GERD (gastroesophageal reflux disease)    Hairy cell leukemia, in remission (HCC) 1992   remission since 2008   IBS (irritable bowel syndrome)    Irritable bowel syndrome    2 yrs ago, states he had inflammation around anus. Biopsy: inclusive   Lumbar foraminal stenosis 12/31/2019   Lumbar radiculopathy 11/15/2019   Sleep apnea    Past Surgical History:  Procedure Laterality Date   BACK SURGERY     BONE MARROW BIOPSY     2007 and 1992   CATARACT EXTRACTION W/PHACO Right 08/15/2020   Procedure: CATARACT EXTRACTION PHACO AND INTRAOCULAR LENS PLACEMENT RIGHT EYE;  Surgeon: Fabio Pierce, MD;  Location: [facility];  Service: Ophthalmology;  Laterality: Right;  right CDE=7.75   COLONOSCOPY  04/19/2006   with polyps   COLONOSCOPY  09/30/2011   Procedure: COLONOSCOPY;  Surgeon: Malissa Hippo, MD;  [facility];  Service: Endoscopy;  Laterality: N/A;   730   COLONOSCOPY N/A 01/11/2019   Procedure: COLONOSCOPY;  Surgeon: Malissa Hippo, MD;  [facility];  Service: Endoscopy;  Laterality: N/A;  100-office move to 9:30am   CYSTOSCOPY W/ RETROGRADES Bilateral 02/23/2021   Procedure: CYSTOSCOPY WITH RETROGRADE PYELOGRAM;  Surgeon: Marcine Matar, MD;  Location: [facility];  Service: Urology;  Laterality: Bilateral;   CYSTOSCOPY W/ RETROGRADES Bilateral 07/05/2022   Procedure: CYSTOSCOPY WITH RETROGRADE PYELOGRAM;  Surgeon: Marcine Matar, MD;  Location: [facility];  Service: Urology;  Laterality: Bilateral;   CYSTOSCOPY WITH BIOPSY N/A 07/05/2022   Procedure: CYSTOSCOPY WITH BIOPSY;  Surgeon: Marcine Matar, MD;  Location: [facility];  Service: Urology;  Laterality: N/A;  30 MINS   HX COLON POLYPS     TONSILLECTOMY     childhood   TRANSFORAMINAL LUMBAR INTERBODY FUSION W/ MIS 1 LEVEL Right 11/18/2020   Procedure: Right Lumbar Five-Sacral One Minimally invasive transforaminal lumbar interbody fusion;  Surgeon: Jadene Pierini, MD;  Location: [facility];  Service: Neurosurgery;  Laterality: Right;   TRANSURETHRAL RESECTION OF BLADDER TUMOR N/A 03/26/2021   Procedure: REPEAT TRANSURETHRAL RESECTION OF BLADDER TUMOR (TURBT)/ POST OPERATIVE INSTILLATION OF GEMCITABINE;  Surgeon: Marcine Matar, MD;  Location: [facility];  Service: Urology;  Laterality: N/A;   TRANSURETHRAL RESECTION OF BLADDER TUMOR WITH MITOMYCIN-C N/A 02/23/2021   Procedure: TRANSURETHRAL RESECTION OF BLADDER TUMOR;  Surgeon: Marcine Matar, MD;  Location: [facility];  Service: Urology;  Laterality: N/A;   TRANSURETHRAL RESECTION OF BLADDER TUMOR WITH MITOMYCIN-C N/A 10/29/2021   Procedure: TRANSURETHRAL RESECTION OF BLADDER TUMOR WITH GEMCITABINE;  Surgeon: Marcine Matar, MD;  Location: [facility];  Service: Urology;  Laterality: N/A;   Social History    Socioeconomic History   Marital status: Married    Spouse name: Not on file   Number of children: Not on file   Years of education: Not on file   Highest education level: Not on file  Occupational History   Not on file  Tobacco Use   Smoking status: Former    Current packs/day: 0.00    Types: Cigarettes    Quit date: 12/18/2005    Years since quitting: 17.0   Smokeless tobacco: Never  Vaping Use   Vaping status: Never Used  Substance and Sexual Activity   Alcohol use: Yes    Alcohol/week: 4.0 standard drinks of alcohol    Types: 4 Cans of beer per week    Comment: social   Drug use: No   Sexual activity: Not on file  Other Topics Concern   Not on file  Social History Narrative   Not on file   Social Determinants of Health   Financial Resource Strain: Not on file  Food Insecurity: No Food Insecurity (12/17/2022)   Hunger Vital Sign    Worried About Running Out of Food in the Last Year: Never true    Ran Out of Food in the Last Year: Never true  Transportation Needs: No Transportation Needs (12/17/2022)   PRAPARE - Administrator, Civil Service (Medical): No    Lack of Transportation (Non-Medical): No  Physical Activity: Not on file  Stress: Not on file  Social Connections: Not on file   History reviewed. No pertinent family history. Allergies  Allergen Reactions   Penicillins Swelling and Other (See Comments)   Pollen Extract      Positive ROS: All other systems have been reviewed and were otherwise negative with the exception of those mentioned in the HPI and as above.  Physical Exam: General: Alert, no acute distress Cardiovascular: No pedal edema Respiratory: No cyanosis, no use of accessory musculature GI: No organomegaly, abdomen is soft and non-tender Skin: No lesions in the area of chief complaint Neurologic: Sensation intact distally Psychiatric: Patient is competent for consent with normal mood and affect Lymphatic: No axillary or  cervical lymphadenopathy  MUSCULOSKELETAL: RLE shortened. Dorsiflexion and plantarflexion intact. Able to flex and extend all toes. 2+ DP and PT pulses. Sensation intact diffusely to right foot.   Assessment/Plan: Right femoral neck fracture - had long discussion with patient this morning regarding injury, surgical plan, and anticipated recovery. He is very active, he did this playing pickleball, and would like to get back to as much activity as possible. Discussed non-operative vs. Operative treatment, he has elected for operative treatment. - plan for right total hip replacement for fracture with Dr. Blanchie Dessert tomorrow morning. Please keep NPO after midnight. - today patient will be NWB RLE, continue current pain control plan.    Armida Sans, PA-C   12/18/2022 9:00 AM

## 2022-12-19 ENCOUNTER — Inpatient Hospital Stay (HOSPITAL_COMMUNITY): Payer: Medicare HMO

## 2022-12-19 ENCOUNTER — Encounter (HOSPITAL_COMMUNITY)
Admission: EM | Disposition: A | Discharge status: Home Health | Payer: Self-pay | Source: Home / Self Care | Attending: Internal Medicine

## 2022-12-19 ENCOUNTER — Encounter (HOSPITAL_COMMUNITY): Payer: Self-pay | Admitting: Internal Medicine

## 2022-12-19 ENCOUNTER — Other Ambulatory Visit: Payer: Self-pay

## 2022-12-19 DIAGNOSIS — S72001A Fracture of unspecified part of neck of right femur, initial encounter for closed fracture: Secondary | ICD-10-CM

## 2022-12-19 DIAGNOSIS — S72001D Fracture of unspecified part of neck of right femur, subsequent encounter for closed fracture with routine healing: Secondary | ICD-10-CM | POA: Diagnosis not present

## 2022-12-19 HISTORY — PX: TOTAL HIP ARTHROPLASTY: SHX124

## 2022-12-19 LAB — BASIC METABOLIC PANEL
Anion gap: 9 (ref 5–15)
BUN: 13 mg/dL (ref 8–23)
CO2: 27 mmol/L (ref 22–32)
Calcium: 8.4 mg/dL — ABNORMAL LOW (ref 8.9–10.3)
Chloride: 101 mmol/L (ref 98–111)
Creatinine, Ser: 0.97 mg/dL (ref 0.61–1.24)
GFR, Estimated: >60 mL/min (ref >60)
Glucose, Bld: 106 mg/dL — ABNORMAL HIGH (ref 70–99)
Potassium: 3.6 mmol/L (ref 3.5–5.1)
Sodium: 137 mmol/L (ref 135–145)

## 2022-12-19 LAB — CBC
HCT: 35.8 % — ABNORMAL LOW (ref 39.0–52.0)
Hemoglobin: 12 g/dL — ABNORMAL LOW (ref 13.0–17.0)
MCH: 31.6 pg (ref 26.0–34.0)
MCHC: 33.5 g/dL (ref 30.0–36.0)
MCV: 94.2 fL (ref 80.0–100.0)
Platelets: 105 10*3/uL — ABNORMAL LOW (ref 150–400)
RBC: 3.8 MIL/uL — ABNORMAL LOW (ref 4.22–5.81)
RDW: 14 % (ref 11.5–15.5)
WBC: 7.5 10*3/uL (ref 4.0–10.5)
nRBC: 0 % (ref 0.0–0.2)

## 2022-12-19 LAB — SURGICAL PCR SCREEN
MRSA, PCR: NEGATIVE
Staphylococcus aureus: POSITIVE — AB

## 2022-12-19 SURGERY — ARTHROPLASTY, HIP, TOTAL,POSTERIOR APPROACH
Anesthesia: Monitor Anesthesia Care | Site: Hip | Laterality: Right

## 2022-12-19 MED ORDER — CHLORHEXIDINE GLUCONATE 0.12 % MT SOLN
OROMUCOSAL | Status: AC
  Administered 2022-12-19: 15 mL via OROMUCOSAL
  Filled 2022-12-19: qty 15

## 2022-12-19 MED ORDER — PROPOFOL 10 MG/ML IV BOLUS
INTRAVENOUS | Status: DC | PRN
  Administered 2022-12-19: 20 mg via INTRAVENOUS
  Administered 2022-12-19: 100 ug/kg/min via INTRAVENOUS

## 2022-12-19 MED ORDER — OXYCODONE HCL 5 MG PO TABS
5.0000 mg | ORAL_TABLET | Freq: Once | ORAL | Status: AC | PRN
  Administered 2022-12-19: 5 mg via ORAL

## 2022-12-19 MED ORDER — BUPIVACAINE LIPOSOME 1.3 % IJ SUSP
INTRAMUSCULAR | Status: DC | PRN
  Administered 2022-12-19: 20 mL

## 2022-12-19 MED ORDER — EPHEDRINE 5 MG/ML INJ
INTRAVENOUS | Status: AC
  Filled 2022-12-19: qty 5

## 2022-12-19 MED ORDER — MIDAZOLAM HCL 2 MG/2ML IJ SOLN
INTRAMUSCULAR | Status: AC
  Filled 2022-12-19: qty 2

## 2022-12-19 MED ORDER — ASPIRIN 81 MG PO TBEC
81.0000 mg | DELAYED_RELEASE_TABLET | Freq: Two times a day (BID) | ORAL | Status: DC
  Administered 2022-12-20: 81 mg via ORAL
  Filled 2022-12-19: qty 1

## 2022-12-19 MED ORDER — KETAMINE HCL 50 MG/5ML IJ SOSY
PREFILLED_SYRINGE | INTRAMUSCULAR | Status: AC
  Filled 2022-12-19: qty 5

## 2022-12-19 MED ORDER — ONDANSETRON HCL 4 MG/2ML IJ SOLN
INTRAMUSCULAR | Status: AC
  Filled 2022-12-19: qty 2

## 2022-12-19 MED ORDER — MIDAZOLAM HCL 2 MG/2ML IJ SOLN
INTRAMUSCULAR | Status: DC | PRN
  Administered 2022-12-19: 1 mg via INTRAVENOUS

## 2022-12-19 MED ORDER — SODIUM CHLORIDE 0.9 % IR SOLN
Status: DC | PRN
  Administered 2022-12-19: 3000 mL

## 2022-12-19 MED ORDER — FENTANYL CITRATE (PF) 100 MCG/2ML IJ SOLN
25.0000 ug | INTRAMUSCULAR | Status: DC | PRN
  Administered 2022-12-19: 25 ug via INTRAVENOUS
  Administered 2022-12-19: 50 ug via INTRAVENOUS
  Administered 2022-12-19: 25 ug via INTRAVENOUS

## 2022-12-19 MED ORDER — TRANEXAMIC ACID-NACL 1000-0.7 MG/100ML-% IV SOLN
1000.0000 mg | INTRAVENOUS | Status: AC
  Administered 2022-12-19: 1000 mg via INTRAVENOUS

## 2022-12-19 MED ORDER — 0.9 % SODIUM CHLORIDE (POUR BTL) OPTIME
TOPICAL | Status: DC | PRN
  Administered 2022-12-19: 1000 mL

## 2022-12-19 MED ORDER — EPHEDRINE SULFATE-NACL 50-0.9 MG/10ML-% IV SOSY
PREFILLED_SYRINGE | INTRAVENOUS | Status: DC | PRN
  Administered 2022-12-19 (×2): 10 mg via INTRAVENOUS
  Administered 2022-12-19: 5 mg via INTRAVENOUS
  Administered 2022-12-19: 10 mg via INTRAVENOUS

## 2022-12-19 MED ORDER — BUPIVACAINE HCL (PF) 0.25 % IJ SOLN
INTRAMUSCULAR | Status: AC
  Filled 2022-12-19: qty 30

## 2022-12-19 MED ORDER — MUPIROCIN 2 % EX OINT
1.0000 | TOPICAL_OINTMENT | Freq: Two times a day (BID) | CUTANEOUS | Status: DC
  Administered 2022-12-19 – 2022-12-20 (×3): 1 via NASAL
  Filled 2022-12-19: qty 22

## 2022-12-19 MED ORDER — PHENYLEPHRINE 80 MCG/ML (10ML) SYRINGE FOR IV PUSH (FOR BLOOD PRESSURE SUPPORT)
PREFILLED_SYRINGE | INTRAVENOUS | Status: AC
  Filled 2022-12-19: qty 10

## 2022-12-19 MED ORDER — DEXAMETHASONE SODIUM PHOSPHATE 10 MG/ML IJ SOLN
INTRAMUSCULAR | Status: AC
  Filled 2022-12-19: qty 1

## 2022-12-19 MED ORDER — FENTANYL CITRATE (PF) 250 MCG/5ML IJ SOLN
INTRAMUSCULAR | Status: DC | PRN
  Administered 2022-12-19: 50 ug via INTRAVENOUS

## 2022-12-19 MED ORDER — SODIUM CHLORIDE (PF) 0.9 % IJ SOLN
INTRAMUSCULAR | Status: DC | PRN
  Administered 2022-12-19: 10 mL

## 2022-12-19 MED ORDER — PHENYLEPHRINE HCL-NACL 20-0.9 MG/250ML-% IV SOLN
INTRAVENOUS | Status: DC | PRN
  Administered 2022-12-19: 30 ug/min via INTRAVENOUS

## 2022-12-19 MED ORDER — DROPERIDOL 2.5 MG/ML IJ SOLN: 0.6250 mg | Freq: Once | INTRAMUSCULAR | Status: DC | PRN

## 2022-12-19 MED ORDER — ASPIRIN 81 MG PO TBEC: 81.0000 mg | DELAYED_RELEASE_TABLET | Freq: Two times a day (BID) | ORAL | 0 refills | Status: AC

## 2022-12-19 MED ORDER — LACTATED RINGERS IV SOLN: INTRAVENOUS | Status: DC

## 2022-12-19 MED ORDER — PROPOFOL 1000 MG/100ML IV EMUL
INTRAVENOUS | Status: AC
  Filled 2022-12-19: qty 200

## 2022-12-19 MED ORDER — CHLORHEXIDINE GLUCONATE CLOTH 2 % EX PADS
6.0000 | MEDICATED_PAD | Freq: Every day | CUTANEOUS | Status: DC
  Administered 2022-12-20: 6 via TOPICAL

## 2022-12-19 MED ORDER — BUPIVACAINE-EPINEPHRINE (PF) 0.25% -1:200000 IJ SOLN
INTRAMUSCULAR | Status: AC
  Filled 2022-12-19: qty 30

## 2022-12-19 MED ORDER — OXYCODONE HCL 5 MG PO TABS
ORAL_TABLET | ORAL | Status: AC
  Filled 2022-12-19: qty 1

## 2022-12-19 MED ORDER — PROPOFOL 10 MG/ML IV BOLUS
INTRAVENOUS | Status: AC
  Filled 2022-12-19: qty 20

## 2022-12-19 MED ORDER — CHLORHEXIDINE GLUCONATE 0.12 % MT SOLN: 15.0000 mL | Freq: Once | OROMUCOSAL | Status: AC

## 2022-12-19 MED ORDER — OXYCODONE HCL 5 MG PO TABS
5.0000 mg | ORAL_TABLET | ORAL | 0 refills | Status: AC | PRN
Start: 2022-12-19 — End: 2022-12-26

## 2022-12-19 MED ORDER — FENTANYL CITRATE (PF) 250 MCG/5ML IJ SOLN
INTRAMUSCULAR | Status: AC
  Filled 2022-12-19: qty 5

## 2022-12-19 MED ORDER — ACETAMINOPHEN 500 MG PO TABS
1000.0000 mg | ORAL_TABLET | Freq: Once | ORAL | Status: AC
  Administered 2022-12-19: 1000 mg via ORAL
  Filled 2022-12-19: qty 2

## 2022-12-19 MED ORDER — CEFAZOLIN SODIUM-DEXTROSE 2-4 GM/100ML-% IV SOLN
2.0000 g | Freq: Three times a day (TID) | INTRAVENOUS | Status: AC
  Administered 2022-12-19 (×2): 2 g via INTRAVENOUS
  Filled 2022-12-19 (×2): qty 100

## 2022-12-19 MED ORDER — CELECOXIB 200 MG PO CAPS
400.0000 mg | ORAL_CAPSULE | Freq: Once | ORAL | Status: AC
  Administered 2022-12-19: 400 mg via ORAL
  Filled 2022-12-19: qty 2

## 2022-12-19 MED ORDER — SURGIRINSE WOUND IRRIGATION SYSTEM - OPTIME
TOPICAL | Status: DC | PRN
  Administered 2022-12-19: 450 mL

## 2022-12-19 MED ORDER — CHLORHEXIDINE GLUCONATE 4 % EX SOLN
CUTANEOUS | Status: AC
  Filled 2022-12-19: qty 15

## 2022-12-19 MED ORDER — DEXAMETHASONE SODIUM PHOSPHATE 10 MG/ML IJ SOLN
INTRAMUSCULAR | Status: DC | PRN
  Administered 2022-12-19: 10 mg via INTRAVENOUS

## 2022-12-19 MED ORDER — BUPIVACAINE-EPINEPHRINE (PF) 0.25% -1:200000 IJ SOLN
INTRAMUSCULAR | Status: DC | PRN
Start: 2022-12-19 — End: 2022-12-19
  Administered 2022-12-19: 30 mL

## 2022-12-19 MED ORDER — BUPIVACAINE IN DEXTROSE 0.75-8.25 % IT SOLN
INTRATHECAL | Status: DC | PRN
Start: 2022-12-19 — End: 2022-12-19
  Administered 2022-12-19: 2 mL via INTRATHECAL

## 2022-12-19 MED ORDER — DEXMEDETOMIDINE HCL IN NACL 80 MCG/20ML IV SOLN
INTRAVENOUS | Status: AC
  Filled 2022-12-19: qty 20

## 2022-12-19 MED ORDER — CEFAZOLIN SODIUM-DEXTROSE 2-4 GM/100ML-% IV SOLN
2.0000 g | INTRAVENOUS | Status: AC
  Administered 2022-12-19: 2 g via INTRAVENOUS

## 2022-12-19 MED ORDER — LIDOCAINE 2% (20 MG/ML) 5 ML SYRINGE
INTRAMUSCULAR | Status: AC
  Filled 2022-12-19: qty 5

## 2022-12-19 MED ORDER — FENTANYL CITRATE (PF) 100 MCG/2ML IJ SOLN
INTRAMUSCULAR | Status: AC
  Filled 2022-12-19: qty 2

## 2022-12-19 MED ORDER — TRANEXAMIC ACID-NACL 1000-0.7 MG/100ML-% IV SOLN
INTRAVENOUS | Status: AC
  Filled 2022-12-19: qty 100

## 2022-12-19 MED ORDER — OXYCODONE HCL 5 MG/5ML PO SOLN: 5.0000 mg | Freq: Once | ORAL | Status: AC | PRN

## 2022-12-19 MED ORDER — POVIDONE-IODINE 10 % EX SWAB: 2.0000 | Freq: Once | CUTANEOUS | Status: DC

## 2022-12-19 MED ORDER — PHENYLEPHRINE HCL-NACL 20-0.9 MG/250ML-% IV SOLN
INTRAVENOUS | Status: AC
  Filled 2022-12-19: qty 250

## 2022-12-19 MED ORDER — KETAMINE HCL 10 MG/ML IJ SOLN
INTRAMUSCULAR | Status: DC | PRN
Start: 2022-12-19 — End: 2022-12-19
  Administered 2022-12-19: 10 mg via INTRAVENOUS

## 2022-12-19 MED ORDER — CEFAZOLIN SODIUM-DEXTROSE 2-4 GM/100ML-% IV SOLN
INTRAVENOUS | Status: AC
  Filled 2022-12-19: qty 100

## 2022-12-19 MED ORDER — ORAL CARE MOUTH RINSE: 15.0000 mL | Freq: Once | OROMUCOSAL | Status: AC

## 2022-12-19 MED ORDER — BUPIVACAINE LIPOSOME 1.3 % IJ SUSP
INTRAMUSCULAR | Status: AC
  Filled 2022-12-19: qty 20

## 2022-12-19 MED ORDER — ONDANSETRON HCL 4 MG/2ML IJ SOLN
INTRAMUSCULAR | Status: DC | PRN
  Administered 2022-12-19: 4 mg via INTRAVENOUS

## 2022-12-19 SURGICAL SUPPLY — 63 items
ADH SKN CLS APL DERMABOND .7 (GAUZE/BANDAGES/DRESSINGS) ×1
APL PRP STRL LF DISP 70% ISPRP (MISCELLANEOUS) ×2
BLADE SAGITTAL 25.0X1.27X90 (BLADE) ×1 IMPLANT
BRUSH FEMORAL CANAL (MISCELLANEOUS) IMPLANT
CHLORAPREP W/TINT 26 (MISCELLANEOUS) ×2 IMPLANT
COVER SURGICAL LIGHT HANDLE (MISCELLANEOUS) ×1 IMPLANT
DERMABOND ADVANCED .7 DNX12 (GAUZE/BANDAGES/DRESSINGS) IMPLANT
DRAPE HALF SHEET 40X57 (DRAPES) ×1 IMPLANT
DRAPE HIP W/POCKET STRL (MISCELLANEOUS) ×1 IMPLANT
DRAPE INCISE IOBAN 85X60 (DRAPES) ×1 IMPLANT
DRAPE POUCH INSTRU U-SHP 10X18 (DRAPES) ×1 IMPLANT
DRAPE U-SHAPE 47X51 STRL (DRAPES) ×2 IMPLANT
DRSG AQUACEL AG ADV 3.5X10 (GAUZE/BANDAGES/DRESSINGS) ×1 IMPLANT
ELECT BLADE 4.0 EZ CLEAN MEGAD (MISCELLANEOUS) ×1
ELECTRODE BLDE 4.0 EZ CLN MEGD (MISCELLANEOUS) ×1 IMPLANT
GLOVE BIOGEL PI IND STRL 8 (GLOVE) ×1 IMPLANT
GLOVE SRG 8 PF TXTR STRL LF DI (GLOVE) ×1 IMPLANT
GLOVE SURG ORTHO 8.0 STRL STRW (GLOVE) ×2 IMPLANT
GLOVE SURG UNDER POLY LF SZ8 (GLOVE) ×1
GOWN STRL REUS W/ TWL LRG LVL3 (GOWN DISPOSABLE) ×2 IMPLANT
GOWN STRL REUS W/ TWL XL LVL3 (GOWN DISPOSABLE) ×1 IMPLANT
GOWN STRL REUS W/TWL LRG LVL3 (GOWN DISPOSABLE) ×2
GOWN STRL REUS W/TWL XL LVL3 (GOWN DISPOSABLE) ×1
HANDPIECE INTERPULSE COAX TIP (DISPOSABLE) ×1
HEAD BIOLOX HIP 36/-2.5 (Joint) IMPLANT
HIP BIOLOX HD 36/-2.5 (Joint) ×1 IMPLANT
HOOD PEEL AWAY T7 (MISCELLANEOUS) ×3 IMPLANT
INSERT 0 DEG POLY 36 F (Miscellaneous) IMPLANT
KIT BASIN OR (CUSTOM PROCEDURE TRAY) ×1 IMPLANT
KIT TURNOVER KIT A (KITS) ×1 IMPLANT
MANIFOLD NEPTUNE II (INSTRUMENTS) ×1 IMPLANT
MARKER SKIN DUAL TIP RULER LAB (MISCELLANEOUS) ×1 IMPLANT
NDL 18GX1X1/2 (RX/OR ONLY) (NEEDLE) ×1 IMPLANT
NDL HYPO 22X1.5 SAFETY MO (MISCELLANEOUS) IMPLANT
NEEDLE 18GX1X1/2 (RX/OR ONLY) (NEEDLE) IMPLANT
NEEDLE HYPO 22X1.5 SAFETY MO (MISCELLANEOUS) ×1 IMPLANT
NS IRRIG 1000ML POUR BTL (IV SOLUTION) ×1 IMPLANT
PACK TOTAL JOINT (CUSTOM PROCEDURE TRAY) ×1 IMPLANT
PRESSURIZER FEMORAL UNIV (MISCELLANEOUS) IMPLANT
RETRIEVER SUT HEWSON (MISCELLANEOUS) ×1 IMPLANT
SCREW HEX LP 6.5X25 (Screw) IMPLANT
SCREW HEX LP 6.5X35 (Screw) IMPLANT
SEALER BIPOLAR AQUA 6.0 (INSTRUMENTS) ×1 IMPLANT
SET HNDPC FAN SPRY TIP SCT (DISPOSABLE) ×1 IMPLANT
SHELL TRIDENT II CLUST SZ 56MM (Shell) IMPLANT
SOLUTION IRRIG SURGIPHOR (IV SOLUTION) ×1 IMPLANT
STEM ACCOLADE II SZ8 (Stem) IMPLANT
SUCTION TUBE FRAZIER 10FR DISP (SUCTIONS) ×1 IMPLANT
SUT BONE WAX W31G (SUTURE) ×1 IMPLANT
SUT ETHIBOND 2 V 37 (SUTURE) ×1 IMPLANT
SUT MNCRL AB 3-0 PS2 18 (SUTURE) ×1 IMPLANT
SUT STRATAFIX 1PDS 45CM VIOLET (SUTURE) ×2 IMPLANT
SUT VIC AB 0 CT1 27 (SUTURE) ×1
SUT VIC AB 0 CT1 27XBRD ANBCTR (SUTURE) ×1 IMPLANT
SUT VIC AB 2-0 CT2 27 (SUTURE) ×2 IMPLANT
SYR 20ML LL LF (SYRINGE) ×1 IMPLANT
SYR 50ML LL SCALE MARK (SYRINGE) ×1 IMPLANT
SYR BULB IRRIG 60ML STRL (SYRINGE) IMPLANT
TOWEL GREEN STERILE (TOWEL DISPOSABLE) ×1 IMPLANT
TOWER CARTRIDGE SMART MIX (DISPOSABLE) IMPLANT
TRAY FOLEY MTR SLVR 16FR STAT (SET/KITS/TRAYS/PACK) ×1 IMPLANT
TUBE SUCT ARGYLE STRL (TUBING) ×1 IMPLANT
WATER STERILE IRR 1000ML POUR (IV SOLUTION) ×1 IMPLANT

## 2022-12-19 NOTE — Discharge Instructions (Addendum)

## 2022-12-19 NOTE — Anesthesia Preprocedure Evaluation (Signed)
Anesthesia Evaluation  Patient identified by MRN, date of birth, ID band Patient awake    Reviewed: Allergy & Precautions, NPO status , Patient's Chart, lab work & pertinent test results  Airway Mallampati: II  TM Distance: >3 FB Neck ROM: Full    Dental no notable dental hx.    Pulmonary sleep apnea , former smoker   Pulmonary exam normal        Cardiovascular negative cardio ROS  Rhythm:Regular Rate:Normal     Neuro/Psych negative neurological ROS  negative psych ROS   GI/Hepatic Neg liver ROS,GERD  ,,  Endo/Other  negative endocrine ROS    Renal/GU negative Renal ROS  negative genitourinary   Musculoskeletal  (+) Arthritis , Osteoarthritis,  Hip fracture   Abdominal Normal abdominal exam  (+)   Peds  Hematology Lab Results      Component                Value               Date                      WBC                      7.5                 12/19/2022                HGB                      12.0 (L)            12/19/2022                HCT                      35.8 (L)            12/19/2022                MCV                      94.2                12/19/2022                PLT                      105 (L)             12/19/2022              Anesthesia Other Findings   Reproductive/Obstetrics                             Anesthesia Physical Anesthesia Plan  ASA: 2  Anesthesia Plan: MAC and Spinal   Post-op Pain Management:    Induction: Intravenous  PONV Risk Score and Plan: 1 and Ondansetron, Dexamethasone, Propofol infusion and Treatment may vary due to age or medical condition  Airway Management Planned: Simple Face Mask and Nasal Cannula  Additional Equipment: None  Intra-op Plan:   Post-operative Plan:   Informed Consent: I have reviewed the patients History and Physical, chart, labs and discussed the procedure including the risks, benefits and alternatives for the  proposed anesthesia with the patient or authorized representative who has indicated his/her  understanding and acceptance.     Dental advisory given  Plan Discussed with: CRNA  Anesthesia Plan Comments:        Anesthesia Quick Evaluation

## 2022-12-19 NOTE — Progress Notes (Signed)
   12/19/22 1538  TOC Brief Assessment  Insurance and Status Reviewed  Patient has primary care physician Yes  Home environment has been reviewed Home  Prior level of function: Independent  Prior/Current Home Services No current home services  Social Determinants of Health Reivew SDOH reviewed no interventions necessary  Readmission risk has been reviewed Yes  Transition of care needs transition of care needs identified, TOC will continue to follow (Watch for [facility] PT/OT needs, DME needs)

## 2022-12-19 NOTE — Evaluation (Signed)
Physical Therapy Evaluation Patient Details Name: Cameron Wu MRN: [id number] DOB: 30-May-1950 Today's Date: 12/19/2022  History of Present Illness  72 y.o. male presenting 8/30 after a mechanical fall while playing pickleball. Work up showed R hip fx s/p R THA 9/1. PMH includes: BPH, GERD, hairy cell leukemia in remission since 2008, IBS, lumbar radiculopathy, sleep apnea, bladder cancer that is post chemo.   Clinical Impression  Pt in bed upon arrival of PT, agreeable to evaluation at this time. Prior to admission the pt was independent with all mobility, playing pickleball, and living [place] townhome with his wife. The pt now presents with limitations in functional mobility, ROM and strength in RLE, stability, and activity tolerance due to above dx, and will continue to benefit from skilled PT to address these deficits. The pt was able to complete bed mobility without assistance, but needed minA for initial sit-stand transfers and CGA for short hallway ambulation with use of RW. The pt will continue to benefit from skilled PT acutely to progress functional strength in RLE, balance, and complete stair training prior to return home.          If plan is discharge home, recommend the following: A little help with walking and/or transfers;A little help with bathing/dressing/bathroom;Help with stairs or ramp for entrance   Can travel by private vehicle        Equipment Recommendations BSC/3in1  Recommendations for Other Services       Functional Status Assessment Patient has had a recent decline in their functional status and demonstrates the ability to make significant improvements in function in a reasonable and predictable amount of time.     Precautions / Restrictions Precautions Precautions: Fall Precaution Comments: no hip precautions per surgeon Restrictions Weight Bearing Restrictions: Yes RLE Weight Bearing: Weight bearing as tolerated      Mobility  Bed  Mobility Overal bed mobility: Needs Assistance Bed Mobility: Supine to Sit     Supine to sit: Contact guard     General bed mobility comments: CGA with pt using bed rail to complete    Transfers Overall transfer level: Needs assistance Equipment used: Rolling walker (2 wheels) Transfers: Sit to/from Stand Sit to Stand: Min assist, Contact guard assist           General transfer comment: minA on initial stand, CGA to control lower to recliner. cues for hand placement    Ambulation/Gait Ambulation/Gait assistance: Contact guard assist Gait Distance (Feet): 45 Feet Assistive device: Rolling walker (2 wheels) Gait Pattern/deviations: Step-through pattern, Decreased stance time - right, Decreased stride length, Narrow base of support Gait velocity: decreased Gait velocity interpretation: <1.31 ft/sec, indicative of household ambulator   General Gait Details: pt with narrow BOS and minimal wt shift to RLE. no overt buckling. VSS     Balance                                             Pertinent Vitals/Pain Pain Assessment Pain Assessment: Faces Pain Score: 7  Faces Pain Scale: Hurts little more Pain Location: R hip with wt bearing Pain Descriptors / Indicators: Grimacing Pain Intervention(s): Limited activity within patient's tolerance, Monitored during session, Repositioned    Home Living Family/patient expects to be discharged to:: Private residence (2 story townhome) Living Arrangements: Spouse/significant other Available Help at Discharge: Family;Available 24 hours/day Type of Home: House Home Access:  Stairs to enter Entrance Stairs-Rails: Right Entrance Stairs-Number of Steps: 3 Alternate Level Stairs-Number of Steps: 13 Home Layout: Two level;Bed/bath upstairs;1/2 bath on main level Home Equipment: Grab bars - tub/shower;Rolling Walker (2 wheels)      Prior Function Prior Level of Function : Independent/Modified Independent;Driving              Mobility Comments: independent, playing pickleball ADLs Comments: independent     Extremity/Trunk Assessment   Upper Extremity Assessment Upper Extremity Assessment: Defer to OT evaluation    Lower Extremity Assessment Lower Extremity Assessment: RLE deficits/detail RLE Deficits / Details: limited in ROM by pain, no changes in sensation. RLE Sensation: WNL RLE Coordination: WNL    Cervical / Trunk Assessment Cervical / Trunk Assessment: Kyphotic  Communication   Communication Communication: No apparent difficulties Cueing Techniques: Verbal cues  Cognition Arousal: Alert Behavior During Therapy: WFL for tasks assessed/performed Overall Cognitive Status: Within Functional Limits for tasks assessed                                          General Comments General comments (skin integrity, edema, etc.): VSS on RA, pt reports mild dizziness on transition to sit and post ambulation. left on 1L O2    Exercises     Assessment/Plan    PT Assessment Patient needs continued PT services  PT Problem List Decreased strength;Decreased range of motion;Decreased activity tolerance;Decreased balance;Decreased mobility;Pain       PT Treatment Interventions DME instruction;Gait training;Stair training;Functional mobility training;Balance training;Patient/family education    PT Goals (Current goals can be found in the Care Plan section)  Acute Rehab PT Goals Patient Stated Goal: to return home PT Goal Formulation: With patient Time For Goal Achievement: 01/02/23 Potential to Achieve Goals: Good    Frequency Min 1X/week        AM-PAC PT "6 Clicks" Mobility  Outcome Measure Help needed turning from your back to your side while in a flat bed without using bedrails?: A Little Help needed moving from lying on your back to sitting on the side of a flat bed without using bedrails?: A Little Help needed moving to and from a bed to a chair (including a  wheelchair)?: A Little Help needed standing up from a chair using your arms (e.g., wheelchair or bedside chair)?: A Little Help needed to walk in hospital room?: A Little Help needed climbing 3-5 steps with a railing? : A Little 6 Click Score: 18    End of Session Equipment Utilized During Treatment: Gait belt Activity Tolerance: Patient tolerated treatment well Patient left: in chair;with call bell/phone within reach;with chair alarm set;with family/visitor present Nurse Communication: Mobility status PT Visit Diagnosis: Unsteadiness on feet (R26.81);Pain Pain - Right/Left: Right Pain - part of body: Hip    Time: 1610-9604 PT Time Calculation (min) (ACUTE ONLY): 25 min   Charges:   PT Evaluation $PT Eval Low Complexity: 1 Low   PT General Charges $$ ACUTE PT VISIT: 1 Visit         Vickki Muff, PT, DPT   Acute Rehabilitation Department Office [phone] Secure Chat Communication Preferred  [name] 12/19/2022, 2:38 PM

## 2022-12-19 NOTE — Transfer of Care (Signed)
Immediate Anesthesia Transfer of Care Note  Patient: Cameron Wu  Procedure(s) Performed: RIGHT POSTERIOR TOTAL HIP ARTHROPLASTY (Right: Hip)  Patient Location: PACU  Anesthesia Type:MAC and Spinal  Level of Consciousness: awake and drowsy  Airway & Oxygen Therapy: Patient Spontanous Breathing and Patient connected to face mask oxygen  Post-op Assessment: Report given to RN and Post -op Vital signs reviewed and stable  Post vital signs: Reviewed and stable  Last Vitals:  Vitals Value Taken Time  BP 118/45 12/19/22 1045  Temp    Pulse 65 12/19/22 1046  Resp 16 12/19/22 1046  SpO2 97 % 12/19/22 1046  Vitals shown include unfiled device data.  Last Pain:  Vitals:   12/19/22 0714  TempSrc: Oral  PainSc: 4          Complications: No notable events documented.

## 2022-12-19 NOTE — Hospital Course (Signed)
  Brief Narrative:  72 year old with history of BPH, GERD, hairy cell leukemia in remission since 2008, IBS, lumbar radiculopathy, sleep apnea, bladder cancer status post chemo following Dr. Retta Diones comes to the ED with mechanical fall while playing pickle ball followed by right hip pain.  X-ray in the ER showed right hip fracture, patient was transferred to [facility] from [facility] at orthopedic request for surgical intervention. Underwent Right hip replacement on 9/1.      Assessment & Plan:  Principal Problem:   Closed right hip fracture [facility]) Active Problems:   Benign prostatic hyperplasia   Lumbar radiculopathy   Right hip fracture; closed.  S/p Right hip replacement on 9/1 Secondary to mechanical fall.  Orthopedic Following Postop wound care, weightbearing, DVT prophylaxis and pain management per orthopedic service.   Short-term memory problems -Continue with Aricept   BPH -continue with home medications   Hyperlipidemia -Continue with statin   Hx of Hairy cell leukemia in remission, Hx of Bladder cancer, status post  chemo, he is following with Dr. Retta Diones       DVT prophylaxis: heparin injection 5,000 Units Start: 12/18/22 2200 SCDs Start: 12/17/22 1552 Code Status: Full code Family Communication:  family at bedside Status is: Inpatient Remains inpatient appropriate because: Post OP management

## 2022-12-19 NOTE — Anesthesia Postprocedure Evaluation (Signed)
Anesthesia Post Note  Patient: Cameron Wu  Procedure(s) Performed: RIGHT POSTERIOR TOTAL HIP ARTHROPLASTY (Right: Hip)     Patient location during evaluation: PACU Anesthesia Type: MAC and Spinal Level of consciousness: awake and alert Pain management: pain level controlled Vital Signs Assessment: post-procedure vital signs reviewed and stable Respiratory status: spontaneous breathing, nonlabored ventilation, respiratory function stable and patient connected to nasal cannula oxygen Cardiovascular status: stable and blood pressure returned to baseline Postop Assessment: no apparent nausea or vomiting Anesthetic complications: no   No notable events documented.  Last Vitals:  Vitals:   12/19/22 1145 12/19/22 1159  BP: 117/70 116/66  Pulse: 70 67  Resp: 16 16  Temp: 36.7 C   SpO2: 95% 97%    Last Pain:  Vitals:   12/19/22 1200  TempSrc:   PainSc: Asleep                 Earl Lites P Yoneko Talerico

## 2022-12-19 NOTE — Progress Notes (Signed)
PROGRESS NOTE    Cameron Wu  YNW:[id number] DOB: 1950/08/16 DOA: 12/17/2022 PCP: Carylon Perches, MD     Brief Narrative:  72 year old with history of BPH, GERD, hairy cell leukemia in remission since 2008, IBS, lumbar radiculopathy, sleep apnea, bladder cancer status post chemo following Dr. Retta Diones comes to the ED with mechanical fall while playing pickle ball followed by right hip pain.  X-ray in the ER showed right hip fracture, patient was transferred to [facility] from [facility] at orthopedic request for surgical intervention. Underwent Right hip replacement on 9/1.      Assessment & Plan:  Principal Problem:   Closed right hip fracture [facility]) Active Problems:   Benign prostatic hyperplasia   Lumbar radiculopathy   Right hip fracture; closed.  S/p Right hip replacement on 9/1 Secondary to mechanical fall.  Orthopedic Following Postop wound care, weightbearing, DVT prophylaxis and pain management per orthopedic service.   Short-term memory problems -Continue with Aricept   BPH -continue with home medications   Hyperlipidemia -Continue with statin   Hx of Hairy cell leukemia in remission, Hx of Bladder cancer, status post  chemo, he is following with Dr. Retta Diones       DVT prophylaxis: heparin injection 5,000 Units Start: 12/18/22 2200 SCDs Start: 12/17/22 1552 Code Status: Full code Family Communication:  family at bedside Status is: Inpatient Remains inpatient appropriate because: Post OP management             Subjective: Seen post op. Doing well  Wife at bedside.    Examination:  General exam: Appears calm and comfortable  Respiratory system: Clear to auscultation. Respiratory effort normal. Cardiovascular system: S1 & S2 heard, RRR. No JVD, murmurs, rubs, gallops or clicks. No pedal edema. Gastrointestinal system: Abdomen is nondistended, soft and nontender. No organomegaly or masses felt. Normal bowel sounds heard. Central  nervous system: Alert and oriented. No focal neurological deficits. Extremities: Symmetric 5 x 5 power. Skin: right hip dressing looks stable.  Psychiatry: Judgement and insight appear normal. Mood & affect appropriate.       Diet Orders (From admission, onward)     Start     Ordered   12/19/22 1200  Diet regular Room service appropriate? Yes; Fluid consistency: Thin  Diet effective now       Question Answer Comment  Room service appropriate? Yes   Fluid consistency: Thin      12/19/22 1159            Objective: Vitals:   12/19/22 1115 12/19/22 1130 12/19/22 1145 12/19/22 1159  BP: (!) 109/51 99/63 117/70 116/66  Pulse: 65 70 70 67  Resp: 18 17 16 16   Temp:      TempSrc:      SpO2: 95% 94% 95% 97%  Weight:      Height:        Intake/Output Summary (Last 24 hours) at 12/19/2022 1202 Last data filed at 12/19/2022 1036 Gross per 24 hour  Intake 1780 ml  Output 2050 ml  Net -270 ml   Filed Weights   12/17/22 1406  Weight: 77.1 kg    Scheduled Meds:  [START ON 12/20/2022] aspirin EC  81 mg Oral BID   Chlorhexidine Gluconate Cloth  6 each Topical Q0600   donepezil  5 mg Oral Daily   feeding supplement  237 mL Oral Q24H   fentaNYL       finasteride  5 mg Oral q morning   heparin  5,000 Units Subcutaneous  Q8H   mupirocin ointment  1 Application Nasal BID   oxyCODONE       rosuvastatin  10 mg Oral QHS   tamsulosin  0.4 mg Oral QHS   Continuous Infusions:   ceFAZolin (ANCEF) IV     methocarbamol (ROBAXIN) IV      Nutritional status Signs/Symptoms: estimated needs Interventions: Ensure Enlive (each supplement provides 350kcal and 20 grams of protein) Body mass index is 23.06 kg/m.  Data Reviewed:   CBC: Recent Labs  Lab 12/17/22 1505 12/19/22 0315  WBC 11.0* 7.5  HGB 12.5* 12.0*  HCT 37.7* 35.8*  MCV 96.7 94.2  PLT 131* 105*   Basic Metabolic Panel: Recent Labs  Lab 12/17/22 1505 12/19/22 0315  NA 140 137  K 3.5 3.6  CL 104 101  CO2 26 27   GLUCOSE 104* 106*  BUN 12 13  CREATININE 0.92 0.97  CALCIUM 8.7* 8.4*   GFR: Estimated Creatinine Clearance: 75.1 mL/min (by C-G formula based on SCr of 0.97 mg/dL). Liver Function Tests: No results for input(s): "AST", "ALT", "ALKPHOS", "BILITOT", "PROT", "ALBUMIN" in the last 168 hours. No results for input(s): "LIPASE", "AMYLASE" in the last 168 hours. No results for input(s): "AMMONIA" in the last 168 hours. Coagulation Profile: No results for input(s): "INR", "PROTIME" in the last 168 hours. Cardiac Enzymes: No results for input(s): "CKTOTAL", "CKMB", "CKMBINDEX", "TROPONINI" in the last 168 hours. BNP (last 3 results) No results for input(s): "PROBNP" in the last 8760 hours. HbA1C: No results for input(s): "HGBA1C" in the last 72 hours. CBG: No results for input(s): "GLUCAP" in the last 168 hours. Lipid Profile: No results for input(s): "CHOL", "HDL", "LDLCALC", "TRIG", "CHOLHDL", "LDLDIRECT" in the last 72 hours. Thyroid Function Tests: No results for input(s): "TSH", "T4TOTAL", "FREET4", "T3FREE", "THYROIDAB" in the last 72 hours. Anemia Panel: No results for input(s): "VITAMINB12", "FOLATE", "FERRITIN", "TIBC", "IRON", "RETICCTPCT" in the last 72 hours. Sepsis Labs: No results for input(s): "PROCALCITON", "LATICACIDVEN" in the last 168 hours.  Recent Results (from the past 240 hour(s))  Surgical PCR screen     Status: Abnormal   Collection Time: 12/18/22 10:45 PM   Specimen: Nasal Mucosa; Nasal Swab  Result Value Ref Range Status   MRSA, PCR NEGATIVE NEGATIVE Final   Staphylococcus aureus POSITIVE (A) NEGATIVE Final    Comment: (NOTE) The Xpert SA Assay (FDA approved for NASAL specimens in patients 38 years of age and older), is one component of a comprehensive surveillance program. It is not intended to diagnose infection nor to guide or monitor treatment. Performed at [facility], [address], [place], [place] [address]          Radiology  Studies: DG HIP UNILAT WITH PELVIS 1V RIGHT  Result Date: 12/19/2022 CLINICAL DATA:  Postop THA. EXAM: DG HIP (WITH OR WITHOUT PELVIS) 1V RIGHT COMPARISON:  12/17/2022. FINDINGS: Patient is status post recent right total hip arthroplasty. Hardware appears intact. Normal alignment. No acute fractures. IMPRESSION: Unremarkable appearance status post recent right total hip arthroplasty. Electronically Signed   By: Layla Maw M.D.   On: 12/19/2022 09:57   Chest Portable 1 View  Result Date: 12/17/2022 CLINICAL DATA:  Preoperative chest examination. Patient fell today while playing pickle ball with pain and injury to the right hip. Right hip fracture. EXAM: PORTABLE CHEST 1 VIEW COMPARISON:  None Available. FINDINGS: Heart size and pulmonary vascularity are normal. Lungs are clear. No pleural effusions. No pneumothorax. Mediastinal contours appear intact. IMPRESSION: No active disease. Electronically  Signed   By: Burman Nieves M.D.   On: 12/17/2022 16:35   DG Femur Min 2 Views Right  Result Date: 12/17/2022 CLINICAL DATA:  Right hip pain after fall. EXAM: RIGHT FEMUR 2 VIEWS COMPARISON:  None Available. FINDINGS: Mildly displaced proximal right femoral neck fracture is noted. The middle and distal portions of the femur are unremarkable. IMPRESSION: Mildly displaced proximal right femoral neck fracture. Electronically Signed   By: Lupita Raider M.D.   On: 12/17/2022 14:59   DG Pelvis 1-2 Views  Result Date: 12/17/2022 CLINICAL DATA:  Right hip pain after fall. EXAM: PELVIS - 1-2 VIEW COMPARISON:  None Available. FINDINGS: Mildly displaced proximal right femoral neck fracture is noted. Left hip is unremarkable. Postsurgical changes are noted in lower lumbar spine. IMPRESSION: Mildly displaced proximal right femoral neck fracture. Electronically Signed   By: Lupita Raider M.D.   On: 12/17/2022 14:58           LOS: 2 days   Time spent= 35 mins    Miguel Rota, MD [facility]  If 7PM-7AM, please contact night-coverage  12/19/2022, 12:02 PM

## 2022-12-19 NOTE — Anesthesia Procedure Notes (Signed)
Spinal  Patient location during procedure: OR Start time: 12/19/2022 8:17 AM End time: 12/19/2022 8:20 AM Staffing Performed: anesthesiologist  Anesthesiologist: Atilano Median, DO Performed by: Atilano Median, DO Authorized by: Atilano Median, DO   Preanesthetic Checklist Completed: patient identified, IV checked, site marked, risks and benefits discussed, surgical consent, monitors and equipment checked, pre-op evaluation and timeout performed Spinal Block Patient position: right lateral decubitus Prep: DuraPrep Patient monitoring: heart rate, cardiac monitor, continuous pulse ox and blood pressure Approach: midline Location: L3-4 Injection technique: single-shot Needle Needle type: Pencan  Needle gauge: 24 G Needle length: 12.7 cm Assessment Events: CSF return Additional Notes Patient identified. Risks/Benefits/Options discussed with patient including but not limited to bleeding, infection, nerve damage, paralysis, failed block, incomplete pain control, headache, blood pressure changes, nausea, vomiting, reactions to medications, itching and postpartum back pain. Confirmed with bedside nurse the patient's most recent platelet count. Confirmed with patient that they are not currently taking any anticoagulation, have any bleeding history or any family history of bleeding disorders. Patient expressed understanding and wished to proceed. All questions were answered. Sterile technique was used throughout the entire procedure. Please see nursing notes for vital signs. Warning signs of high block given to the patient including shortness of breath, tingling/numbness in hands, complete motor block, or any concerning symptoms with instructions to call for help. Patient was given instructions on fall risk and not to get out of bed. All questions and concerns addressed with instructions to call with any issues or inadequate analgesia.

## 2022-12-19 NOTE — TOC CAGE-AID Note (Signed)
Transition of Care [facility]) - CAGE-AID Screening   Patient Details  Name: Cameron Wu MRN: [id number] Date of Birth: [birth year]   Hewitt Shorts, RN Trauma Response Nurse Phone Number: [phone] 12/19/2022, 3:00 PM   Clinical Narrative:  Pt had been ambulating in the hall with PT, sitting in the chair when this TRN went into room.   CAGE-AID Screening:    Have You Ever Felt You Ought to Cut Down on Your Drinking or Drug Use?: No Have People Annoyed You By Critizing Your Drinking Or Drug Use?: No Have You Felt Bad Or Guilty About Your Drinking Or Drug Use?: No Have You Ever Had a Drink or Used Drugs First Thing In The Morning to Steady Your Nerves or to Get Rid of a Hangover?: No CAGE-AID Score: 0  Substance Abuse Education Offered: No (states he drinks alcohol occasionally,)

## 2022-12-19 NOTE — Evaluation (Signed)
Occupational Therapy Evaluation Patient Details Name: Cameron Wu MRN: [id number] DOB: 1951-03-14 Today's Date: 12/19/2022   History of Present Illness 72 y.o. male, with past medical history of BPH, GERD, hairy cell leukemia in remission since 2008, IBS, lumbar radiculopathy, sleep apnea, bladder cancer that is post chemo, presents secondary to mechanical fall.  S/p R THA 9/1.   Clinical Impression   Patient admitted for the diagnosis above.  PTA he lives at home with his spouse, and remains active and independent.  Currently he c/o 7/10 R hip pain, but is walking with CGA, Min A for sit to stand, and Mod A for lower body ADL.  OT will follow in the acute setting to address deficits, and no post acute OT is anticipated.        If plan is discharge home, recommend the following: Assist for transportation;Assistance with cooking/housework;Help with stairs or ramp for entrance;A little help with bathing/dressing/bathroom    Functional Status Assessment  Patient has had a recent decline in their functional status and demonstrates the ability to make significant improvements in function in a reasonable and predictable amount of time.  Equipment Recommendations  Tub/shower bench    Recommendations for Other Services       Precautions / Restrictions Precautions Precautions: Fall Restrictions Weight Bearing Restrictions: Yes RLE Weight Bearing: Weight bearing as tolerated      Mobility Bed Mobility                    Transfers                          Balance Overall balance assessment: Needs assistance Sitting-balance support: Feet supported Sitting balance-Leahy Scale: Good     Standing balance support: Reliant on assistive device for balance Standing balance-Leahy Scale: Poor                             ADL either performed or assessed with clinical judgement   ADL       Grooming: Wash/dry hands;Standing;Contact guard assist                Lower Body Dressing: Moderate assistance;Sit to/from stand   Toilet Transfer: Minimal assistance;Rolling walker (2 wheels);Regular Toilet;Ambulation                   Vision Baseline Vision/History: 1 Wears glasses Patient Visual Report: No change from baseline       Perception Perception: Within Functional Limits       Praxis Praxis: WFL       Pertinent Vitals/Pain Pain Assessment Pain Assessment: Faces Faces Pain Scale: Hurts little more Pain Location: R hip incision Pain Descriptors / Indicators: Grimacing     Extremity/Trunk Assessment Upper Extremity Assessment Upper Extremity Assessment: Overall WFL for tasks assessed   Lower Extremity Assessment Lower Extremity Assessment: Defer to PT evaluation   Cervical / Trunk Assessment Cervical / Trunk Assessment: Kyphotic   Communication Communication Communication: No apparent difficulties   Cognition Arousal: Alert Behavior During Therapy: WFL for tasks assessed/performed Overall Cognitive Status: Within Functional Limits for tasks assessed                                       General Comments   Watch O2    Exercises     Shoulder  Instructions      Home Living Family/patient expects to be discharged to:: Private residence (2 story townhome) Living Arrangements: Spouse/significant other Available Help at Discharge: Family;Available 24 hours/day Type of Home: House Home Access: Stairs to enter [employer] of Steps: 3 Entrance Stairs-Rails: Right Home Layout: Two level;Bed/bath upstairs;1/2 bath on main level Alternate Level Stairs-Number of Steps: 13 Alternate Level Stairs-Rails: Can reach both;Left;Right Bathroom Shower/Tub: Chief Strategy Officer: Standard Bathroom Accessibility: Yes How Accessible: Accessible via walker Home Equipment: Grab bars - tub/shower;Rolling Walker (2 wheels)          Prior Functioning/Environment Prior Level of  Function : Independent/Modified Independent;Driving                        OT Problem List: Decreased range of motion;Decreased activity tolerance;Impaired balance (sitting and/or standing);Pain      OT Treatment/Interventions: Self-care/ADL training;Therapeutic activities;Balance training;DME and/or AE instruction    OT Goals(Current goals can be found in the care plan section) Acute Rehab OT Goals Patient Stated Goal: Return home OT Goal Formulation: With patient Time For Goal Achievement: 01/03/23 Potential to Achieve Goals: Good  OT Frequency: Min 1X/week    Co-evaluation              AM-PAC OT "6 Clicks" Daily Activity     Outcome Measure Help from another person eating meals?: None Help from another person taking care of personal grooming?: A Little Help from another person toileting, which includes using toliet, bedpan, or urinal?: A Little Help from another person bathing (including washing, rinsing, drying)?: A Little Help from another person to put on and taking off regular upper body clothing?: None Help from another person to put on and taking off regular lower body clothing?: A Little 6 Click Score: 20   End of Session Equipment Utilized During Treatment: Rolling walker (2 wheels) Nurse Communication: Mobility status  Activity Tolerance: Patient tolerated treatment well Patient left: in chair;with call bell/phone within reach;with chair alarm set;with family/visitor present  OT Visit Diagnosis: Unsteadiness on feet (R26.81)                Time: 1329-1400 OT Time Calculation (min): 31 min Charges:  OT General Charges $OT Visit: 1 Visit OT Evaluation $OT Eval Moderate Complexity: 1 Mod  12/19/2022  RP, OTR/L  Acute Rehabilitation Services  Office:  [phone]   Suzanna Obey 12/19/2022, 2:16 PM

## 2022-12-19 NOTE — Plan of Care (Signed)
  Problem: Education: Goal: Knowledge of General Education information will improve Description: Including pain rating scale, medication(s)/side effects and non-pharmacologic comfort measures Outcome: Progressing   Problem: Health Behavior/Discharge Planning: Goal: Ability to manage health-related needs will improve Outcome: Progressing   Problem: Activity: Goal: Risk for activity intolerance will decrease Outcome: Progressing   

## 2022-12-19 NOTE — Anesthesia Procedure Notes (Signed)
Procedure Name: MAC Date/Time: 12/19/2022 8:54 AM  Performed by: Debbe Odea, CRNAPre-anesthesia Checklist: Patient identified, Emergency Drugs available, Suction available, Patient being monitored and Timeout performed Patient Re-evaluated:Patient Re-evaluated prior to induction Oxygen Delivery Method: Simple face mask Preoxygenation: Pre-oxygenation with 100% oxygen Induction Type: IV induction

## 2022-12-19 NOTE — Op Note (Signed)
12/17/2022 - 12/19/2022  10:03 AM  PATIENT:  Cameron Wu   MRN: [id number]  PRE-OPERATIVE DIAGNOSIS: Right displaced femoral neck fracture  POST-OPERATIVE DIAGNOSIS:  same  PROCEDURE:  Procedure(s): Right total hip replacement posterior approach  PREOPERATIVE INDICATIONS:    DEON HACKING is an 72 y.o. male who has a diagnosis of right displaced femoral neck fracture and elected for surgical management after failing conservative treatment.  The risks benefits and alternatives were discussed with the patient including but not limited to the risks of nonoperative treatment, versus surgical intervention including infection, bleeding, nerve injury, periprosthetic fracture, the need for revision surgery, dislocation, leg length discrepancy, blood clots, cardiopulmonary complications, morbidity, mortality, among others, and they were willing to proceed.     OPERATIVE REPORT     SURGEON:  Weber Cooks, MD    ASSISTANT: Kathie Dike, PA-C, (Present throughout the entire procedure,  necessary for completion of procedure in a timely manner, assisting with retraction, instrumentation, and closure)     ANESTHESIA: Spinal  ESTIMATED BLOOD LOSS: 500cc    COMPLICATIONS:  None.      COMPONENTS:   Stryker Trident 256 mm acetabular shell, 6.5 hex screws x 2, Accolade 2 size 8 stem with 127 great offset, 36-2.57mm ceramic head Implant Name Type Inv. Item Serial No. Manufacturer Lot No. LRB No. Used Action  SHELL TRIDENT II CLUST SZ - [id number] Shell SHELL TRIDENT II CLUST SZ  [employer] [id number] A Right 1 Implanted  INSERT 0 DEG POLY 36 F - [id number] Miscellaneous INSERT 0 DEG POLY 36 F  [employer] [id number] Right 1 Implanted  SCREW HEX LP 6.5X25 - [id number] Screw SCREW HEX LP 6.5X25  [employer] [id number] Right 1 Implanted  SCREW HEX LP 6.5X35 - [id number] Screw SCREW HEX LP 6.5X35  [employer] [id number] Right 1 Implanted  STEM ACCOLADE II SZ8 -  [id number] Stem STEM ACCOLADE II SZ8  [employer] [id number] A Right 1 Implanted  HIP BIOLOX HD 36/-2.5 - [id number] Joint HIP BIOLOX HD 36/-2.5  [employer] [id number] Right 1 Implanted    The aquamantis was utilized for this case to help facilitate better hemostasis as patient was felt to be at increased risk of bleeding because of increased bony bleeding during the procedure.     PROCEDURE IN DETAIL:   The patient was met in the holding area and  identified.  The appropriate hip was identified and marked at the operative site.  The patient was then transported to the OR  and  placed under anesthesia.  At that point, the patient was  placed in the lateral decubitus position with the operative side up and  secured to the operating room table  and all bony prominences padded. A subaxillary role was also placed.    The operative lower extremity was prepped from the iliac crest to the distal leg.  Sterile draping was performed.  Preoperative antibiotics, 2 gm of ancef,1 gm of Tranexamic Acid, and 8 mg of Decadron administered. Time out was performed prior to incision.      A routine posterolateral approach was utilized via sharp dissection  carried down to the subcutaneous tissue.  Gross bleeders were Bovie coagulated.  The iliotibial band was identified and incised along the length of the skin incision through the glute max fascia.  Charnley retractor was placed with care to protect the sciatic nerve posteriorly.  With the hip internally rotated, the piriformis tendon was identified and released from the femoral insertion and tagged with  a #5 Ethibond.  A capsulotomy was then performed off the femoral insertion and also tagged with a #5 Ethibond.    The femoral neck was exposed, and I resected the femoral neck just distal to the fracture.  The femoral head was removed and measured 52 mm.      I then exposed the deep acetabulum, cleared out any tissue including the ligamentum teres.  After  adequate visualization, I excised the labrum.  I then started reaming with a 50 mm reamer, first medializing to the floor of the cotyloid fossa, and then in the position of the cup aiming towards the greater sciatic notch, matching the version of the transverse acetabular ligament and tucked under the anterior wall. I reamed up to 56 mm reamer with good bony bed preparation and a 56 mm cup was chosen.  The real cup was then impacted into place.  Appropriate version and inclination was confirmed clinically matching their bony anatomy, and also with the use of the jig.  I placed 2 screws in the posterior superior quadrant to augment fixation.  A neutral liner was placed and impacted. It was confirmed to be appropriately seated and the acetabular retractors were removed.    I then prepared the proximal femur using the box cutter, Charnley awl, and then sequentially broached starting with 0 up to a size 7.  A trial broach, neck, and head was utilized, and I reduced the hip and it was found to have excellent stability.  There was no impingement with full extension and 90 degrees external rotation.  The hip was stable at the position of sleep and with 90 degrees flexion and 80 degrees of internal rotation.  Leg lengths were also clinically assessed in the lateral position and felt to be equal. Intra-Op flatplate was obtained and confirmed appropriate component positions.  Good fill of the femur with the size 7 broach.  And restoration of leg length and offset. No evidence or concern for fracture. Checked the 7 broach for axial stability and subsided a couple of millimeters elected to work up to the size 8 broach which had excellent axial and rotational stability.  A final femoral prosthesis size 8 was selected. I then impacted the real femoral prosthesis into place.I again trialed and selected a 36 - 2.50mm ball. The hip was then reduced and taken through a range of motion. There was no impingement with full  extension and 90 degrees external rotation.  The hip was stable at the position of sleep and with 90 degrees flexion and 80 degrees of internal rotation. Leg lengths were  again assessed and felt to be restored.  We then opened, and I impacted the real head ball into place.  The posterior capsule was then closed with #2 Ethibond.  The piriformis was repaired through the base of the abductor tendon using a [place] suture passer.  I then irrigated the hip copiously with dilute Betadine and with normal saline pulse lavage. Periarticular injection was then performed with Exparel.   We repaired the fascia #1 barbed suture, followed by 0 barbed suture for the subcutaneous fat.  Skin was closed with 2-0 Vicryl and 3-0 Monocryl.  Dermabond and Aquacel dressing were applied. The patient was then awakened and returned to PACU in stable and satisfactory condition.  Leg lengths in the supine position were assessed and felt to be clinically equal. There were no complications.  Post op recs: WB: WBAT RLE, No formal hip precautions Abx: ancef Imaging: PACU pelvis Xray  Dressing: Aquacell, keep intact until follow up DVT prophylaxis: Aspirin 81BID starting POD1 Follow up: 2 weeks after surgery for a wound check with Dr. Blanchie Dessert at [facility].  Address: [address], [place], [place] [address]  Office Phone: [phone]   Weber Cooks, MD Orthopedic Surgeon

## 2022-12-19 NOTE — Interval H&P Note (Signed)
The patient has been re-examined, and the chart reviewed, and there have been no interval changes to the documented history and physical.    Plan for R THA for right femoral neck fracture  The operative side was examined and the patient was confirmed to have sensation to DPN, SPN, TN intact, Motor EHL, ext, flex 5/5, and DP 2+, PT 2+, No significant edema.   The risks, benefits, and alternatives have been discussed at length with patient, and the patient is willing to proceed.  Right hip marked. Consent has been signed.

## 2022-12-19 NOTE — Progress Notes (Signed)
PT Cancellation Note  Patient Details Name: [name] MRN: [id number] DOB: Aug 19, 1950   Cancelled Treatment:    Reason Eval/Treat Not Completed: Patient at procedure or test/unavailable this morning, pt in OR for R THA. Will continue to follow and evaluate as appropriate.   Vickki Muff, PT, DPT   Acute Rehabilitation Department Office [phone] Secure Chat Communication Preferred   [name] 12/19/2022, 10:20 AM

## 2022-12-20 DIAGNOSIS — S72001G Fracture of unspecified part of neck of right femur, subsequent encounter for closed fracture with delayed healing: Secondary | ICD-10-CM

## 2022-12-20 LAB — CBC
HCT: 30.5 % — ABNORMAL LOW (ref 39.0–52.0)
Hemoglobin: 10.1 g/dL — ABNORMAL LOW (ref 13.0–17.0)
MCH: 31.3 pg (ref 26.0–34.0)
MCHC: 33.1 g/dL (ref 30.0–36.0)
MCV: 94.4 fL (ref 80.0–100.0)
Platelets: 108 10*3/uL — ABNORMAL LOW (ref 150–400)
RBC: 3.23 MIL/uL — ABNORMAL LOW (ref 4.22–5.81)
RDW: 13.6 % (ref 11.5–15.5)
WBC: 14.7 10*3/uL — ABNORMAL HIGH (ref 4.0–10.5)
nRBC: 0 % (ref 0.0–0.2)

## 2022-12-20 LAB — BASIC METABOLIC PANEL
Anion gap: 7 (ref 5–15)
BUN: 16 mg/dL (ref 8–23)
CO2: 25 mmol/L (ref 22–32)
Calcium: 8.2 mg/dL — ABNORMAL LOW (ref 8.9–10.3)
Chloride: 102 mmol/L (ref 98–111)
Creatinine, Ser: 0.93 mg/dL (ref 0.61–1.24)
GFR, Estimated: >60 mL/min (ref >60)
Glucose, Bld: 156 mg/dL — ABNORMAL HIGH (ref 70–99)
Potassium: 4 mmol/L (ref 3.5–5.1)
Sodium: 134 mmol/L — ABNORMAL LOW (ref 135–145)

## 2022-12-20 MED ORDER — POLYETHYLENE GLYCOL 3350 17 G PO PACK: 17.0000 g | PACK | Freq: Every day | ORAL | 0 refills | Status: DC | PRN

## 2022-12-20 MED ORDER — SENNOSIDES-DOCUSATE SODIUM 8.6-50 MG PO TABS: 1.0000 | ORAL_TABLET | Freq: Every evening | ORAL | 0 refills | Status: DC | PRN

## 2022-12-20 NOTE — Discharge Summary (Signed)
Physician Discharge Summary  Cameron Wu FAO:[id number] DOB: 1950-07-08 DOA: 12/17/2022  PCP: Carylon Perches, MD  Admit date: 12/17/2022 Discharge date: 12/20/2022  Admitted From: Home Home Disposition: Home  Recommendations for Outpatient Follow-up:  Follow up with PCP in 1-2 weeks Please obtain BMP/CBC in one week your next doctors visit.  Medications with bowel regimen Outpatient follow-up with orthopedic Aspirin for DVT prophylaxis per orthopedic   Discharge Condition: Stable CODE STATUS: Full code Diet recommendation: Regular  Brief/Interim Summary:  Brief Narrative:  72 year old with history of BPH, GERD, hairy cell leukemia in remission since 2008, IBS, lumbar radiculopathy, sleep apnea, bladder cancer status post chemo following Dr. Retta Diones comes to the ED with mechanical fall while playing pickle ball followed by right hip pain.  X-ray in the ER showed right hip fracture, patient was transferred to [facility] from [facility] at orthopedic request for surgical intervention. Underwent Right hip replacement on 9/1.  PT OT recommended home health, face-to-face completed.  Cleared for discharge today.     Assessment & Plan:  Principal Problem:   Closed right hip fracture [facility]) Active Problems:   Benign prostatic hyperplasia   Lumbar radiculopathy   Right hip fracture; closed.  S/p Right hip replacement on 9/1 Secondary to mechanical fall.  Orthopedic Following Postop wound care, weightbearing, DVT prophylaxis and pain management per orthopedic service.  Postoperatively PT/OT recommended home health therefore arrangements will be made by TOC   Short-term memory problems -Continue with Aricept   BPH -continue with home medications   Hyperlipidemia -Continue with statin   Hx of Hairy cell leukemia in remission, Hx of Bladder cancer, status post  chemo, he is following with Dr. Retta Diones       DVT prophylaxis: heparin injection 5,000 Units Start: 12/18/22  2200 SCDs Start: 12/17/22 1552 Code Status: Full code Family Communication:  Status is: Inpatient Remains inpatient appropriate because: Home with home health services     Discharge Diagnoses:  Principal Problem:   Closed right hip fracture (HCC) Active Problems:   Benign prostatic hyperplasia   Lumbar radiculopathy      Consultations: Orthopedic  Subjective: Feeling well, would like to go home.  No complaints  Discharge Exam: Vitals:   12/20/22 0512 12/20/22 0823  BP: 114/64 (!) 125/51  Pulse: 71 82  Resp: 19 18  Temp: (!) 97.5 F (36.4 C) 97.6 F (36.4 C)  SpO2: 94% 99%   Vitals:   12/19/22 1145 12/19/22 1159 12/20/22 0512 12/20/22 0823  BP: 117/70 116/66 114/64 (!) 125/51  Pulse: 70 67 71 82  Resp: 16 16 19 18   Temp: 98.1 F (36.7 C)  (!) 97.5 F (36.4 C) 97.6 F (36.4 C)  TempSrc:   Oral Oral  SpO2: 95% 97% 94% 99%  Weight:      Height:        General: Pt is alert, awake, not in acute distress Cardiovascular: RRR, S1/S2 +, no rubs, no gallops Respiratory: CTA bilaterally, no wheezing, no rhonchi Abdominal: Soft, NT, ND, bowel sounds + Extremities: no edema, no cyanosis Hip surgical dressing looks dry intact without any evidence of infection  Discharge Instructions   Allergies as of 12/20/2022       Reactions   Penicillins Swelling, Other (See Comments)   Pollen Extract         Medication List     TAKE these medications    aspirin EC 81 MG tablet Take 1 tablet (81 mg total) by mouth 2 (two) times daily  for 28 days. Swallow whole.   Clear Eyes Seasonal Relief 0.012-0.2-0.25 % Soln Generic drug: Naphazoline-Glycerin-Zinc Sulf Place 1 drop into both eyes daily as needed (allergies).   clonazePAM 0.5 MG tablet Commonly known as: KLONOPIN Take 0.5 mg by mouth daily as needed.   diphenhydrAMINE 25 mg capsule Commonly known as: BENADRYL Take 25-50 mg by mouth every 6 (six) hours as needed for allergies or sleep.   donepezil 10 MG  tablet Commonly known as: ARICEPT Take 5 mg by mouth daily.   doxylamine (Sleep) 25 MG tablet Commonly known as: UNISOM Take 25 mg by mouth at bedtime as needed for sleep.   finasteride 5 MG tablet Commonly known as: PROSCAR Take 1 tablet (5 mg total) by mouth every morning. What changed: when to take this   GINKOBA PO Take 120 mg by mouth daily. Stopped will restart after surgery   ibuprofen 200 MG tablet Commonly known as: ADVIL Take 400 mg by mouth every 6 (six) hours as needed (Inflammation).   LEG CRAMPS PM SL Place 2 tablets under the tongue daily as needed (leg cramps).   levocetirizine 5 MG tablet Commonly known as: XYZAL Take 5 mg by mouth at bedtime as needed for allergies.   loperamide 2 MG capsule Commonly known as: IMODIUM Take 2 mg by mouth as needed for diarrhea or loose stools.   One-A-Day Mens 50+ Tabs Take 1 tablet by mouth daily.   oxyCODONE 5 MG immediate release tablet Commonly known as: Roxicodone Take 1 tablet (5 mg total) by mouth every 4 (four) hours as needed for up to 7 days for severe pain or moderate pain.   polyethylene glycol 17 g packet Commonly known as: MIRALAX / GLYCOLAX Take 17 g by mouth daily as needed for mild constipation.   rosuvastatin 10 MG tablet Commonly known as: CRESTOR Take 10 mg by mouth at bedtime.   senna-docusate 8.6-50 MG tablet Commonly known as: Senokot-S Take 1 tablet by mouth at bedtime as needed for moderate constipation.   simethicone 125 MG chewable tablet Commonly known as: MYLICON Chew 125 mg by mouth 2 (two) times daily as needed for flatulence.   tamsulosin 0.4 MG Caps capsule Commonly known as: FLOMAX Take 1 capsule (0.4 mg total) by mouth at bedtime. What changed: when to take this        Follow-up Information     Joen Laura, MD Follow up in 2 week(s).   Specialty: Orthopedic Surgery Contact information: [address] [place] [address] [phone]          Carylon Perches, MD Follow up in 1 week(s).   Specialty: Internal Medicine Contact information: [address] [place] [address] [phone]         [facility] Follow up.   Specialty: Home Health Services Why: Home Health Physical therapy, Occupational therapy and Home Health Aide. Office will call to follow up with after discharge with plans to see you tomorrow. Contact information: [address] [place] [address] [phone]                Allergies  Allergen Reactions   Penicillins Swelling and Other (See Comments)   Pollen Extract     You were cared for by a hospitalist during your hospital stay. If you have any questions about your discharge medications or the care you received while you were in the hospital after you are discharged, you can call the unit  and asked to speak with the hospitalist on call if the hospitalist that took care of you is not available. Once you are discharged, your primary care physician will handle any further medical issues. Please note that no refills for any discharge medications will be authorized once you are discharged, as it is imperative that you return to your primary care physician (or establish a relationship with a primary care physician if you do not have one) for your aftercare needs so that they can reassess your need for medications and monitor your lab values.  You were cared for by a hospitalist during your hospital stay. If you have any questions about your discharge medications or the care you received while you were in the hospital after you are discharged, you can call the unit and asked to speak with the hospitalist on call if the hospitalist that took care of you is not available. Once you are discharged, your primary care physician will handle any further medical issues. Please note that NO REFILLS for any discharge medications will be authorized once you are discharged, as  it is imperative that you return to your primary care physician (or establish a relationship with a primary care physician if you do not have one) for your aftercare needs so that they can reassess your need for medications and monitor your lab values.  Please request your Prim.MD to go over all Hospital Tests and Procedure/Radiological results at the follow up, please get all Hospital records sent to your Prim MD by signing hospital release before you go home.  Get CBC, CMP, 2 view Chest X ray checked  by Primary MD during your next visit or SNF MD in 5-7 days ( we routinely change or add medications that can affect your baseline labs and fluid status, therefore we recommend that you get the mentioned basic workup next visit with your PCP, your PCP may decide not to get them or add new tests based on their clinical decision)  On your next visit with your primary care physician please Get Medicines reviewed and adjusted.  If you experience worsening of your admission symptoms, develop shortness of breath, life threatening emergency, suicidal or homicidal thoughts you must seek medical attention immediately by calling 911 or calling your MD immediately  if symptoms less severe.  You Must read complete instructions/literature along with all the possible adverse reactions/side effects for all the Medicines you take and that have been prescribed to you. Take any new Medicines after you have completely understood and accpet all the possible adverse reactions/side effects.   Do not drive, operate heavy machinery, perform activities at heights, swimming or participation in water activities or provide baby sitting services if your were admitted for syncope or siezures until you have seen by Primary MD or a Neurologist and advised to do so again.  Do not drive when taking Pain medications.   Procedures/Studies: DG HIP UNILAT W OR W/O PELVIS 2-3 VIEWS RIGHT  Result Date: 12/19/2022 CLINICAL DATA:   Postoperative state. Status post right hip arthroplasty. EXAM: DG HIP (WITH OR WITHOUT PELVIS) 2-3V RIGHT COMPARISON:  Intraoperative right hip radiograph 12/19/2022, right femur radiographs 12/17/2022 FINDINGS: Interval total right hip arthroplasty. No perihardware lucency is seen to indicate hardware failure or loosening. Expected postoperative right hip subcutaneous air. Mild left femoral head-neck junction degenerative osteophytosis. Partial visualization of lumbosacral posterior fusion hardware. IMPRESSION: Interval total right hip arthroplasty without evidence of hardware failure or loosening. Electronically Signed   By: Neita Garnet  M.D.   On: 12/19/2022 13:33   DG HIP UNILAT WITH PELVIS 1V RIGHT  Result Date: 12/19/2022 CLINICAL DATA:  Postop THA. EXAM: DG HIP (WITH OR WITHOUT PELVIS) 1V RIGHT COMPARISON:  12/17/2022. FINDINGS: Patient is status post recent right total hip arthroplasty. Hardware appears intact. Normal alignment. No acute fractures. IMPRESSION: Unremarkable appearance status post recent right total hip arthroplasty. Electronically Signed   By: Layla Maw M.D.   On: 12/19/2022 09:57   Chest Portable 1 View  Result Date: 12/17/2022 CLINICAL DATA:  Preoperative chest examination. Patient fell today while playing pickle ball with pain and injury to the right hip. Right hip fracture. EXAM: PORTABLE CHEST 1 VIEW COMPARISON:  None Available. FINDINGS: Heart size and pulmonary vascularity are normal. Lungs are clear. No pleural effusions. No pneumothorax. Mediastinal contours appear intact. IMPRESSION: No active disease. Electronically Signed   By: Burman Nieves M.D.   On: 12/17/2022 16:35   DG Femur Min 2 Views Right  Result Date: 12/17/2022 CLINICAL DATA:  Right hip pain after fall. EXAM: RIGHT FEMUR 2 VIEWS COMPARISON:  None Available. FINDINGS: Mildly displaced proximal right femoral neck fracture is noted. The middle and distal portions of the femur are unremarkable.  IMPRESSION: Mildly displaced proximal right femoral neck fracture. Electronically Signed   By: Lupita Raider M.D.   On: 12/17/2022 14:59   DG Pelvis 1-2 Views  Result Date: 12/17/2022 CLINICAL DATA:  Right hip pain after fall. EXAM: PELVIS - 1-2 VIEW COMPARISON:  None Available. FINDINGS: Mildly displaced proximal right femoral neck fracture is noted. Left hip is unremarkable. Postsurgical changes are noted in lower lumbar spine. IMPRESSION: Mildly displaced proximal right femoral neck fracture. Electronically Signed   By: Lupita Raider M.D.   On: 12/17/2022 14:58     The results of significant diagnostics from this hospitalization (including imaging, microbiology, ancillary and laboratory) are listed below for reference.     Microbiology: Recent Results (from the past 240 hour(s))  Surgical PCR screen     Status: Abnormal   Collection Time: 12/18/22 10:45 PM   Specimen: Nasal Mucosa; Nasal Swab  Result Value Ref Range Status   MRSA, PCR NEGATIVE NEGATIVE Final   Staphylococcus aureus POSITIVE (A) NEGATIVE Final    Comment: (NOTE) The Xpert SA Assay (FDA approved for NASAL specimens in patients 68 years of age and older), is one component of a comprehensive surveillance program. It is not intended to diagnose infection nor to guide or monitor treatment. Performed at [facility], [address] [place], [place] [address]      Labs: BNP (last 3 results) No results for input(s): "BNP" in the last 8760 hours. Basic Metabolic Panel: Recent Labs  Lab 12/17/22 1505 12/19/22 0315 12/20/22 0344  NA 140 137 134*  K 3.5 3.6 4.0  CL 104 101 102  CO2 26 27 25   GLUCOSE 104* 106* 156*  BUN 12 13 16   CREATININE 0.92 0.97 0.93  CALCIUM 8.7* 8.4* 8.2*   Liver Function Tests: No results for input(s): "AST", "ALT", "ALKPHOS", "BILITOT", "PROT", "ALBUMIN" in the last 168 hours. No results for input(s): "LIPASE", "AMYLASE" in the last 168 hours. No results for input(s):  "AMMONIA" in the last 168 hours. CBC: Recent Labs  Lab 12/17/22 1505 12/19/22 0315 12/20/22 0344  WBC 11.0* 7.5 14.7*  HGB 12.5* 12.0* 10.1*  HCT 37.7* 35.8* 30.5*  MCV 96.7 94.2 94.4  PLT 131* 105* 108*   Cardiac Enzymes: No results for input(s): "CKTOTAL", "  CKMB", "CKMBINDEX", "TROPONINI" in the last 168 hours. BNP: Invalid input(s): "POCBNP" CBG: No results for input(s): "GLUCAP" in the last 168 hours. D-Dimer No results for input(s): "DDIMER" in the last 72 hours. Hgb A1c No results for input(s): "HGBA1C" in the last 72 hours. Lipid Profile No results for input(s): "CHOL", "HDL", "LDLCALC", "TRIG", "CHOLHDL", "LDLDIRECT" in the last 72 hours. Thyroid function studies No results for input(s): "TSH", "T4TOTAL", "T3FREE", "THYROIDAB" in the last 72 hours.  Invalid input(s): "FREET3" Anemia work up No results for input(s): "VITAMINB12", "FOLATE", "FERRITIN", "TIBC", "IRON", "RETICCTPCT" in the last 72 hours. Urinalysis    Component Value Date/Time   APPEARANCEUR Clear 10/05/2022 1045   GLUCOSEU Negative 10/05/2022 1045   BILIRUBINUR Negative 10/05/2022 1045   PROTEINUR Negative 10/05/2022 1045   NITRITE Negative 10/05/2022 1045   LEUKOCYTESUR Negative 10/05/2022 1045   Sepsis Labs Recent Labs  Lab 12/17/22 1505 12/19/22 0315 12/20/22 0344  WBC 11.0* 7.5 14.7*   Microbiology Recent Results (from the past 240 hour(s))  Surgical PCR screen     Status: Abnormal   Collection Time: 12/18/22 10:45 PM   Specimen: Nasal Mucosa; Nasal Swab  Result Value Ref Range Status   MRSA, PCR NEGATIVE NEGATIVE Final   Staphylococcus aureus POSITIVE (A) NEGATIVE Final    Comment: (NOTE) The Xpert SA Assay (FDA approved for NASAL specimens in patients 72 years of age and older), is one component of a comprehensive surveillance program. It is not intended to diagnose infection nor to guide or monitor treatment. Performed at [facility], [address], [place],  [place] [address]      Time coordinating discharge:  I have spent 35 minutes face to face with the patient and on the ward discussing the patients care, assessment, plan and disposition with other care givers. >50% of the time was devoted counseling the patient about the risks and benefits of treatment/Discharge disposition and coordinating care.   SIGNED:   Miguel Rota, MD  [facility] 12/20/2022, 11:02 AM   If 7PM-7AM, please contact night-coverage

## 2022-12-20 NOTE — Progress Notes (Signed)
Physical Therapy Treatment Patient Details Name: Cameron Wu MRN: [id number] DOB: 06-18-1950 Today's Date: 12/20/2022   History of Present Illness 72 y.o. male presenting 8/30 after a mechanical fall while playing pickleball. Work up showed R hip fx s/p R THA 9/1. PMH includes: BPH, GERD, hairy cell leukemia in remission since 2008, IBS, lumbar radiculopathy, sleep apnea, bladder cancer that is post chemo.    PT Comments  Pt tolerated treatment well today. Pt received ambulating around room with RW independently. Session today focused on stair training which pt tolerated well. Pt mobility is adequate for DC home today. No change in DC/DME recs at this time.     If plan is discharge home, recommend the following: A little help with walking and/or transfers;A little help with bathing/dressing/bathroom;Help with stairs or ramp for entrance   Can travel by private vehicle        Equipment Recommendations  BSC/3in1    Recommendations for Other Services       Precautions / Restrictions Precautions Precautions: Fall Precaution Comments: no hip precautions per surgeon Restrictions Weight Bearing Restrictions: Yes RLE Weight Bearing: Weight bearing as tolerated     Mobility  Bed Mobility               General bed mobility comments: Pt up ambulating around room independently upon arrival.    Transfers                   General transfer comment: Pt up ambulating around room independently upon arrival.    Ambulation/Gait Ambulation/Gait assistance: Modified independent (Device/Increase time) Gait Distance (Feet): 50 Feet Assistive device: Rolling walker (2 wheels) Gait Pattern/deviations: Step-through pattern, Decreased stance time - right, Decreased stride length, Narrow base of support Gait velocity: decreased     General Gait Details: pt with narrow BOS and minimal wt shift to RLE. no overt buckling. VSS (Similar to previous session)   Stairs Stairs:  Yes Stairs assistance: Contact guard assist Stair Management: One rail Right, Step to pattern, Forwards, Backwards, Seated/boosting Number of Stairs: 6 General stair comments: Cues for sequencing. Pt wanted to try seated/boosting method however felt that other way was more efiicient.   Wheelchair Mobility     Tilt Bed    Modified Rankin (Stroke Patients Only)       Balance Overall balance assessment: Mild deficits observed, not formally tested                                          Cognition Arousal: Alert Behavior During Therapy: WFL for tasks assessed/performed Overall Cognitive Status: Within Functional Limits for tasks assessed                                          Exercises      General Comments General comments (skin integrity, edema, etc.): VSS on RA      Pertinent Vitals/Pain Pain Assessment Pain Assessment: Faces Faces Pain Scale: Hurts a little bit Pain Location: R hip with wt bearing Pain Descriptors / Indicators: Grimacing Pain Intervention(s): Monitored during session    Home Living                          Prior Function  PT Goals (current goals can now be found in the care plan section) Progress towards PT goals: Progressing toward goals    Frequency    Min 1X/week      PT Plan      Co-evaluation              AM-PAC PT "6 Clicks" Mobility   Outcome Measure  Help needed turning from your back to your side while in a flat bed without using bedrails?: A Little Help needed moving from lying on your back to sitting on the side of a flat bed without using bedrails?: A Little Help needed moving to and from a bed to a chair (including a wheelchair)?: A Little Help needed standing up from a chair using your arms (e.g., wheelchair or bedside chair)?: A Little Help needed to walk in hospital room?: A Little Help needed climbing 3-5 steps with a railing? : A Little 6 Click  Score: 18    End of Session Equipment Utilized During Treatment: Gait belt Activity Tolerance: Patient tolerated treatment well Patient left: Other (comment) (Pt continuing to ambulate around room) Nurse Communication: Mobility status PT Visit Diagnosis: Unsteadiness on feet (R26.81);Pain Pain - Right/Left: Right Pain - part of body: Hip     Time: 1002-1017 PT Time Calculation (min) (ACUTE ONLY): 15 min  Charges:    $Gait Training: 8-22 mins PT General Charges $$ ACUTE PT VISIT: 1 Visit                     Shela Nevin, PT, DPT Acute Rehab Services [phone]    Gladys Damme 12/20/2022, 10:35 AM

## 2022-12-20 NOTE — Progress Notes (Signed)
Pt in stable condition; wife in room as pt.'s transport home.

## 2022-12-20 NOTE — TOC Transition Note (Addendum)
Transition of Care [facility]) - CM/SW Discharge Note   Patient Details  Name: Cameron Wu MRN: [id number] Date of Birth: 1950/11/22  [facility] CM/SW Contact:  Ronny Bacon, RN Phone Number: 12/20/2022, 10:35 AM   Clinical Narrative:   Patient is expected to be discharged today. Spoke with patient at bedside to discuss [facility] options. Patient did not have a preference on [facility] agency used. [facility] able to accept patient for [facility] PT/OT/Aide services starting 12/21/22. Information placed on AVS for patient.  Patient declined DME equipment, reports that he has RW at home.    Final next level of care: Home w Home Health Services Barriers to Discharge: No Barriers Identified   Patient Goals and CMS Choice      Discharge Placement                         Discharge Plan and Services Additional resources added to the After Visit Summary for                  DME Arranged: Patient refused services         HH Arranged: PT, OT, Nurse's Aide HH Agency: [facility] Date [facility] Agency Contacted: 12/20/22 Time HH Agency Contacted: 1026 Representative spoke with at [facility] Agency: [facility]  Social Determinants of Health (SDOH) Interventions SDOH Screenings   Food Insecurity: No Food Insecurity (12/17/2022)  Housing: Low Risk  (12/17/2022)  Transportation Needs: No Transportation Needs (12/17/2022)  Utilities: Not At Risk (12/17/2022)  Depression (PHQ2-9): Low Risk  (06/05/2018)  Tobacco Use: Medium Risk (12/19/2022)     Readmission Risk Interventions     No data to display

## 2022-12-20 NOTE — Progress Notes (Signed)
     Subjective: Patient is doing very well.  Mobilizing independently with a walker.  Eager to go home.  Hopeful for today.  Discussed that we can set him up for outpatient physical therapy at his first follow-up.  Objective:   VITALS:   Vitals:   12/19/22 1130 12/19/22 1145 12/19/22 1159 12/20/22 0512  BP: 99/63 117/70 116/66 114/64  Pulse: 70 70 67 71  Resp: 17 16 16 19   Temp:  98.1 F (36.7 C)  (!) 97.5 F (36.4 C)  TempSrc:    Oral  SpO2: 94% 95% 97% 94%  Weight:      Height:        Sensation intact distally Intact pulses distally Dorsiflexion/Plantar flexion intact Incision: dressing C/D/I Compartment soft    Lab Results  Component Value Date   WBC 14.7 (H) 12/20/2022   HGB 10.1 (L) 12/20/2022   HCT 30.5 (L) 12/20/2022   MCV 94.4 12/20/2022   PLT 108 (L) 12/20/2022   BMET    Component Value Date/Time   NA 134 (L) 12/20/2022 0344   NA 144 07/27/2022 1008   NA 143 03/12/2013 1320   K 4.0 12/20/2022 0344   K 4.0 03/12/2013 1320   CL 102 12/20/2022 0344   CL 107 03/06/2012 1439   CO2 25 12/20/2022 0344   CO2 28 03/12/2013 1320   GLUCOSE 156 (H) 12/20/2022 0344   GLUCOSE 73 03/12/2013 1320   GLUCOSE 79 03/06/2012 1439   BUN 16 12/20/2022 0344   BUN 12 07/27/2022 1008   BUN 18.2 03/12/2013 1320   CREATININE 0.93 12/20/2022 0344   CREATININE 0.98 08/24/2022 1051   CREATININE 1.04 11/12/2014 1121   CREATININE 1.0 03/12/2013 1320   CALCIUM 8.2 (L) 12/20/2022 0344   CALCIUM 9.2 03/12/2013 1320   EGFR 80 07/27/2022 1008   GFRNONAA >60 12/20/2022 0344   GFRNONAA >60 08/24/2022 1051   GFRNONAA 75 11/07/2013 1036     Xray: X-rays demonstrate total right this components in good position no adverse features  Assessment/Plan: 1 Day Post-Op   Principal Problem:   Closed right hip fracture [facility]) Active Problems:   Benign prostatic hyperplasia   Lumbar radiculopathy  Right total hip arthroplasty for femoral neck fracture 12/19/2022  Post op recs: WB:  WBAT RLE, No formal hip precautions Abx: ancef Imaging: PACU pelvis Xray Dressing: Aquacell, keep intact until follow up DVT prophylaxis: Aspirin 81BID starting POD1 Follow up: 2 weeks after surgery for a wound check with Dr. Blanchie Dessert at [facility].  Address: [address], [place], [place] [address]  Office Phone: [phone]  Joen Laura 12/20/2022, 6:43 AM   Weber Cooks, MD  Contact information:   [phone] 7am-5pm epic message Dr. Blanchie Dessert, or call office for patient follow up: [phone] After hours and holidays please check [email or web address] for group call information for [facility]

## 2022-12-20 NOTE — Plan of Care (Signed)
  Problem: Education: Goal: Knowledge of General Education information will improve Description: Including pain rating scale, medication(s)/side effects and non-pharmacologic comfort measures Outcome: Progressing   Problem: Health Behavior/Discharge Planning: Goal: Ability to manage health-related needs will improve Outcome: Progressing   Problem: Clinical Measurements: Goal: Ability to maintain clinical measurements within normal limits will improve Outcome: Progressing Goal: Will remain free from infection Outcome: Progressing Goal: Diagnostic test results will improve Outcome: Progressing Goal: Respiratory complications will improve Outcome: Progressing   Problem: Nutrition: Goal: Adequate nutrition will be maintained Outcome: Progressing   

## 2022-12-21 ENCOUNTER — Encounter (HOSPITAL_COMMUNITY): Payer: Self-pay | Admitting: Orthopedic Surgery

## 2022-12-31 ENCOUNTER — Other Ambulatory Visit (HOSPITAL_COMMUNITY): Payer: Self-pay | Admitting: Neurological Surgery

## 2022-12-31 DIAGNOSIS — M50123 Cervical disc disorder at C6-C7 level with radiculopathy: Secondary | ICD-10-CM

## 2023-01-14 ENCOUNTER — Ambulatory Visit (HOSPITAL_COMMUNITY): Payer: Medicare HMO

## 2023-01-20 ENCOUNTER — Ambulatory Visit (HOSPITAL_COMMUNITY)
Admission: RE | Admit: 2023-01-20 | Discharge: 2023-01-20 | Disposition: A | Discharge status: Home / Self Care | Payer: Medicare HMO | Source: Ambulatory Visit | Attending: Neurological Surgery | Admitting: Neurological Surgery

## 2023-01-20 DIAGNOSIS — M50123 Cervical disc disorder at C6-C7 level with radiculopathy: Secondary | ICD-10-CM | POA: Diagnosis present

## 2023-01-26 ENCOUNTER — Telehealth: Payer: Self-pay | Admitting: Urology

## 2023-01-26 NOTE — Telephone Encounter (Signed)
Patient called said that he never received the results from the [facility]  test back in June so he doesn't know if he is to set a follow up appointment or not, please advise,

## 2023-01-26 NOTE — Telephone Encounter (Signed)
Open in error

## 2023-01-26 NOTE — Telephone Encounter (Signed)
Tried calling patient with no answer, unable to leave vm.

## 2023-02-11 ENCOUNTER — Other Ambulatory Visit (HOSPITAL_COMMUNITY): Payer: Self-pay | Admitting: Internal Medicine

## 2023-02-11 DIAGNOSIS — Z8781 Personal history of (healed) traumatic fracture: Secondary | ICD-10-CM

## 2023-02-17 ENCOUNTER — Ambulatory Visit (HOSPITAL_COMMUNITY)
Admission: RE | Admit: 2023-02-17 | Discharge: 2023-02-17 | Disposition: A | Discharge status: Home / Self Care | Payer: Medicare HMO | Source: Ambulatory Visit | Attending: Internal Medicine | Admitting: Internal Medicine

## 2023-02-17 DIAGNOSIS — M85852 Other specified disorders of bone density and structure, left thigh: Secondary | ICD-10-CM | POA: Diagnosis not present

## 2023-02-17 DIAGNOSIS — Z13820 Encounter for screening for osteoporosis: Secondary | ICD-10-CM | POA: Diagnosis not present

## 2023-02-17 DIAGNOSIS — Z8781 Personal history of (healed) traumatic fracture: Secondary | ICD-10-CM | POA: Insufficient documentation

## 2023-02-26 ENCOUNTER — Other Ambulatory Visit: Payer: Self-pay | Admitting: Urology

## 2023-02-26 DIAGNOSIS — R972 Elevated prostate specific antigen [PSA]: Secondary | ICD-10-CM

## 2023-03-01 ENCOUNTER — Ambulatory Visit (INDEPENDENT_AMBULATORY_CARE_PROVIDER_SITE_OTHER): Payer: Medicare HMO

## 2023-03-01 DIAGNOSIS — C678 Malignant neoplasm of overlapping sites of bladder: Secondary | ICD-10-CM | POA: Diagnosis not present

## 2023-03-01 LAB — URINALYSIS, ROUTINE W REFLEX MICROSCOPIC
Bilirubin, UA: NEGATIVE
Glucose, UA: NEGATIVE
Ketones, UA: NEGATIVE
Leukocytes,UA: NEGATIVE
Nitrite, UA: NEGATIVE
Protein,UA: NEGATIVE
RBC, UA: NEGATIVE
Specific Gravity, UA: 1.01 (ref 1.005–1.030)
Urobilinogen, Ur: 0.2 mg/dL (ref 0.2–1.0)
pH, UA: 6 (ref 5.0–7.5)

## 2023-03-01 MED ORDER — BCG LIVE 50 MG IS SUSR
3.2400 mL | Freq: Once | INTRAVESICAL | Status: AC
Start: 2023-03-01 — End: 2023-03-01
  Administered 2023-03-01: 81 mg via INTRAVESICAL

## 2023-03-01 NOTE — Progress Notes (Signed)
BCG Bladder Instillation  BCG # 1 of 3  Due to Bladder Cancer patient is present today for a BCG treatment. Patient was cleaned and prepped in a sterile fashion with betadine. A 14FR catheter was inserted, urine return was noted 20ml, urine was yellow in color.  50ml of reconstituted BCG was instilled into the bladder. The catheter was then removed. Patient tolerated well, no complications were noted  Performed by: Khaliah Barnick LPn  Follow up/ Additional notes: Keep scheduled NV

## 2023-03-08 ENCOUNTER — Ambulatory Visit: Payer: Medicare HMO

## 2023-03-08 DIAGNOSIS — C678 Malignant neoplasm of overlapping sites of bladder: Secondary | ICD-10-CM | POA: Diagnosis not present

## 2023-03-08 LAB — URINALYSIS, ROUTINE W REFLEX MICROSCOPIC
Bilirubin, UA: NEGATIVE
Glucose, UA: NEGATIVE
Ketones, UA: NEGATIVE
Leukocytes,UA: NEGATIVE
Nitrite, UA: NEGATIVE
Protein,UA: NEGATIVE
RBC, UA: NEGATIVE
Specific Gravity, UA: 1.01 (ref 1.005–1.030)
Urobilinogen, Ur: 0.2 mg/dL (ref 0.2–1.0)
pH, UA: 6 (ref 5.0–7.5)

## 2023-03-08 MED ORDER — BCG LIVE 50 MG IS SUSR
3.2400 mL | Freq: Once | INTRAVESICAL | Status: AC
  Administered 2023-03-08: 81 mg via INTRAVESICAL

## 2023-03-08 NOTE — Progress Notes (Addendum)
BCG Bladder Instillation  BCG # 2 of 3  Due to Bladder Cancer patient is present today for a BCG treatment. Patient was cleaned and prepped in a sterile fashion with betadine. A 14FR catheter was inserted, urine return was noted 25ml, urine was yellow in color.  50ml of reconstituted BCG was instilled into the bladder. The catheter was then removed. Patient tolerated well, no complications were noted  Performed by: Dyrell Tuccillo LPN  Follow up/ Additional notes: keep scheduled NV  I supervised this procedure and agree with the note.

## 2023-03-15 ENCOUNTER — Ambulatory Visit: Payer: Medicare HMO

## 2023-03-15 DIAGNOSIS — C678 Malignant neoplasm of overlapping sites of bladder: Secondary | ICD-10-CM | POA: Diagnosis not present

## 2023-03-15 LAB — URINALYSIS, ROUTINE W REFLEX MICROSCOPIC
Bilirubin, UA: NEGATIVE
Glucose, UA: NEGATIVE
Ketones, UA: NEGATIVE
Leukocytes,UA: NEGATIVE
Nitrite, UA: NEGATIVE
Protein,UA: NEGATIVE
Specific Gravity, UA: 1.01 (ref 1.005–1.030)
Urobilinogen, Ur: 0.2 mg/dL (ref 0.2–1.0)
pH, UA: 6 (ref 5.0–7.5)

## 2023-03-15 MED ORDER — BCG LIVE 50 MG IS SUSR
3.2400 mL | Freq: Once | INTRAVESICAL | Status: AC
  Administered 2023-03-15: 81 mg via INTRAVESICAL

## 2023-03-15 NOTE — Progress Notes (Signed)
BCG Bladder Instillation  BCG # 3 of 3  Due to Bladder Cancer patient is present today for a BCG treatment. Patient was cleaned and prepped in a sterile fashion with betadine. A 14FR catheter was inserted, urine return was noted 50ml, urine was yellow in color.  50ml of reconstituted BCG was instilled into the bladder. The catheter was then removed. Patient tolerated well, no complications were noted  Performed by: Areli Frary LPN  Follow up/ Additional notes: keep scheduled OV

## 2023-03-21 IMAGING — RF DG C-ARM 1-60 MIN
1 series · 5 of 5 positions shown · non-contrast
Comparison: 11/07/2019

CLINICAL DATA: L5-S1 fusion

EXAM:
DG C-ARM 1-60 MIN; LUMBAR SPINE - 2-3 VIEW
FLUOROSCOPY TIME:  Fluoroscopy Time:  2 minutes 4 seconds
Radiation Exposure Index (if provided by the fluoroscopic device):
120.06 mGy
Number of Acquired Spot Images: 5

[Series 1: run · 5 of 5 slices shown]
[im 1/5]
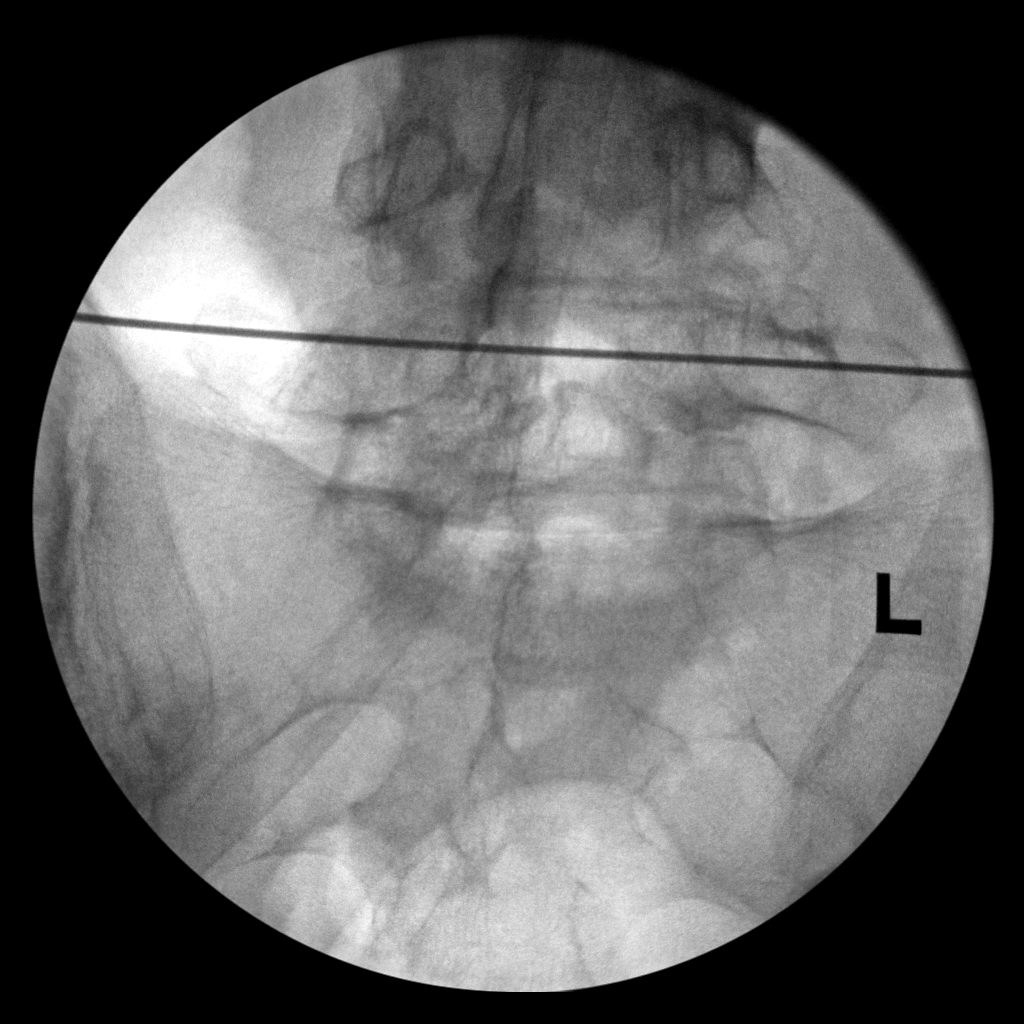
[im 2/5]
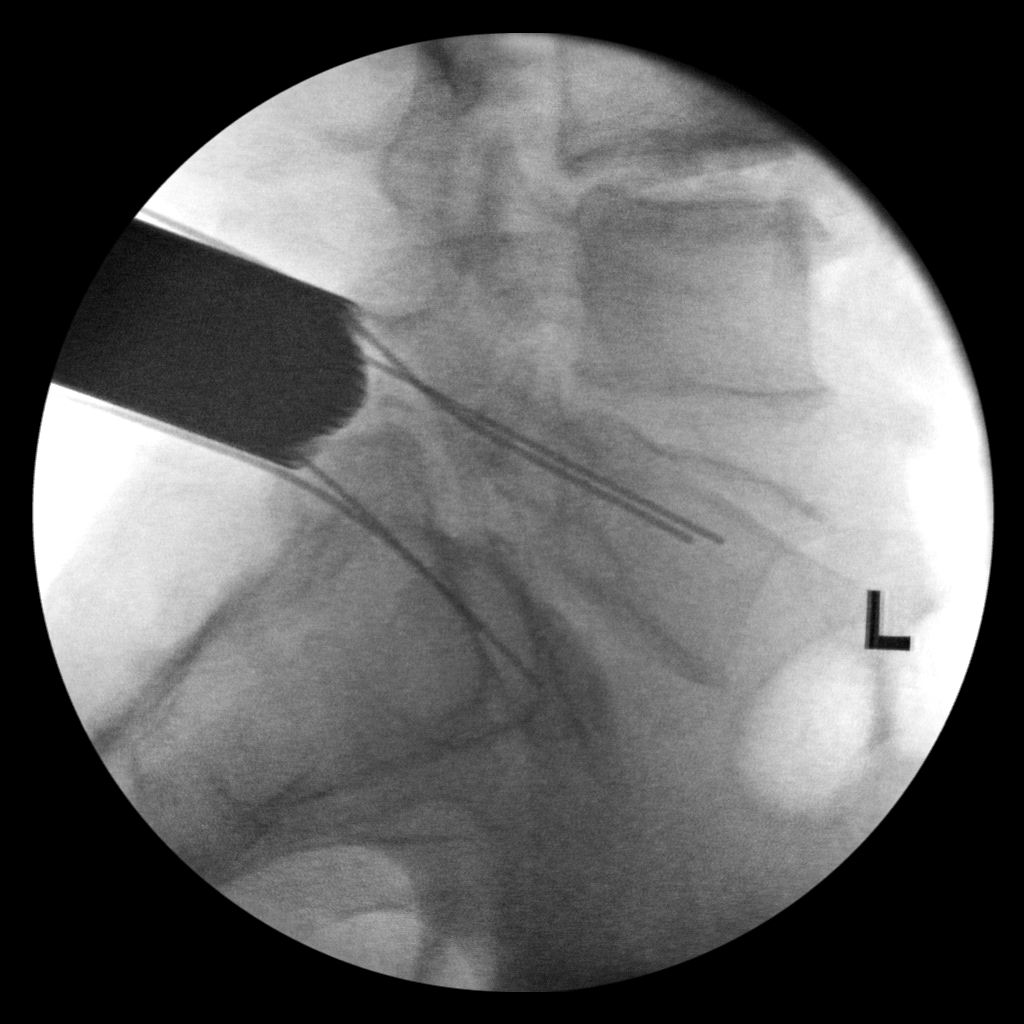
[im 3/5]
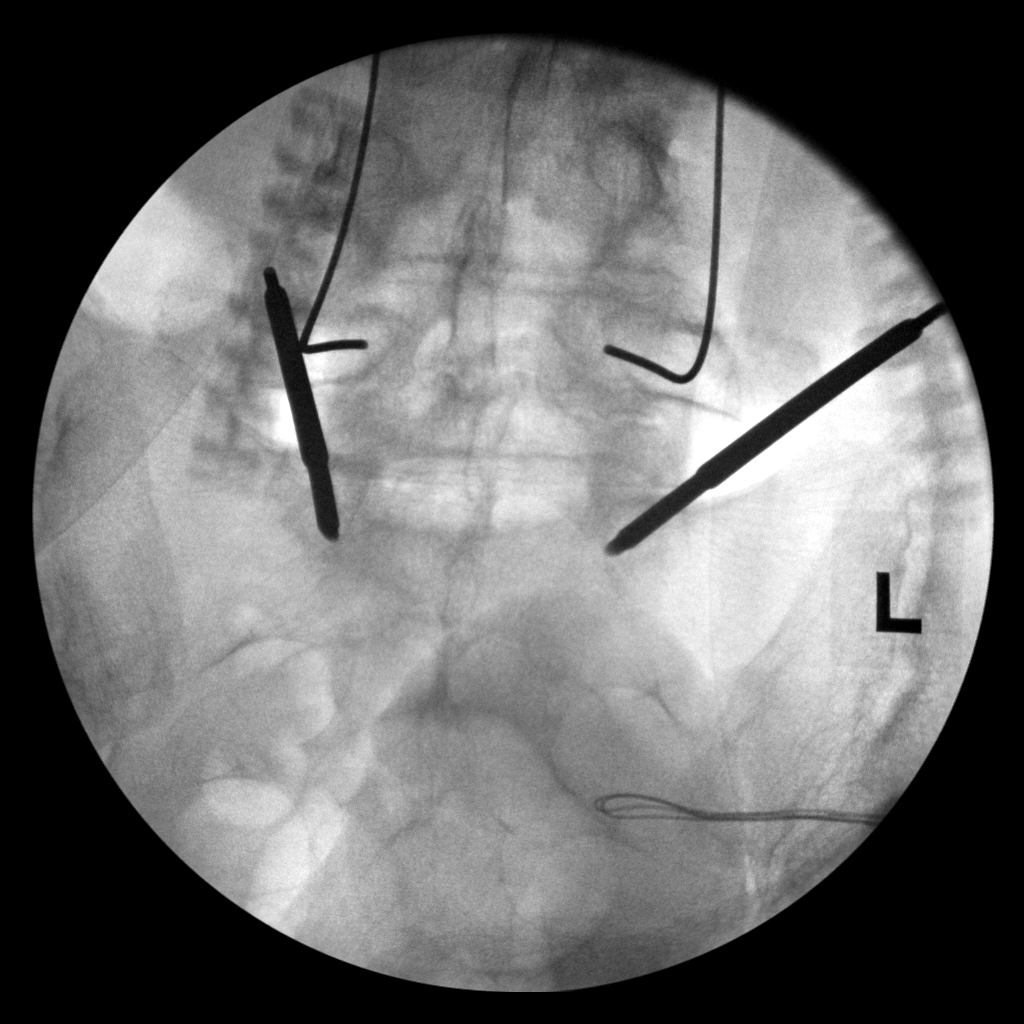
[im 4/5]
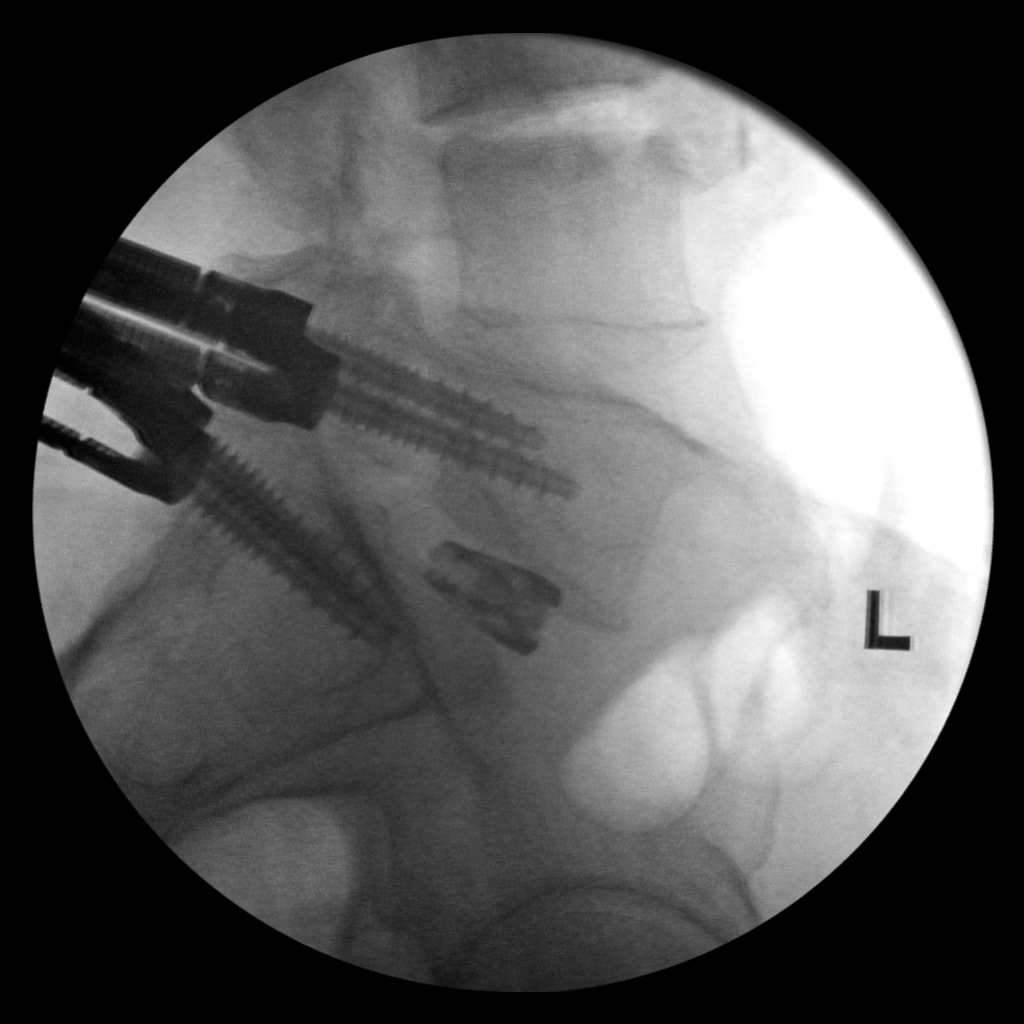
[im 5/5]
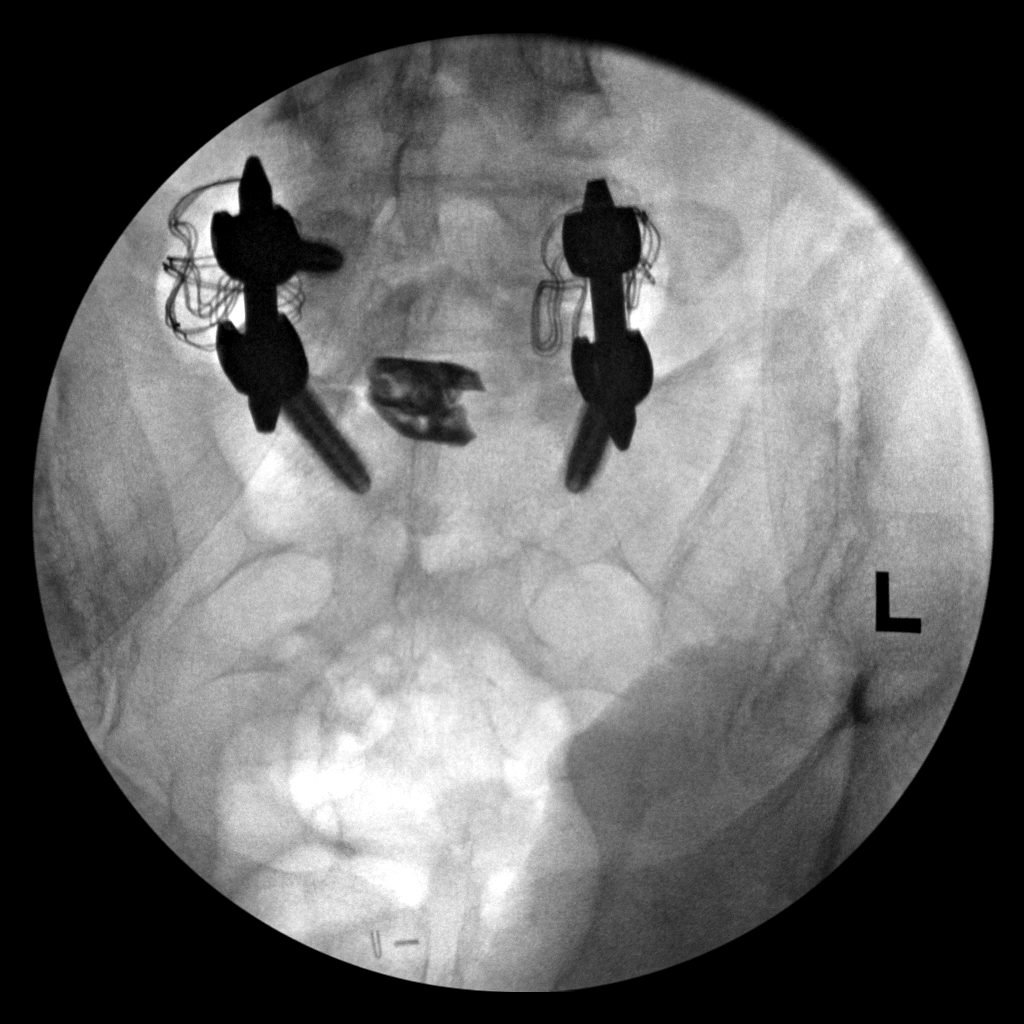

[5 of 5 positions shown; findings below may reference images not displayed]

FINDINGS: 5 fluoroscopic images are obtained during the performance of
procedure and are provided for interpretation only. Images
demonstrate discectomy and posterior fusion at L5/S1. There is grade
[DATE] anterolisthesis of L5 on S1 unchanged since previous MRI. No
evidence of acute complication. Please refer to the operative
report.
IMPRESSION: 1. L5-S1 discectomy and posterior fusion.

## 2023-04-25 NOTE — Progress Notes (Signed)
 History of Present Illness: Cameron Wu is here for followup of bladder cancer.   11.7.2022: TURBT-  Pathology-high-grade nonmuscle invasive bladder cancer.--6.5 cm posterior wall lesion.  Muscularis present in specimen.  He did not receive gemcitabine .   12.8.2022: Repeat TURBT--8 mm Lt anterior wall papillary lesion, repeat posterior bladder wall lesion resection. Both specimens revealed HG NMIBC. Gemcitabine  administered.   2.8.2023: Induction BCG (6 rxs) completed.   3.21.2023: Here for follow-up with cystoscopy.    5.10.2023: Completed first BCG maintenance   6.27.2023: Here for surveillance cystoscopy.  Significant erythematous area in his trigonal region as well as 2-3 papillary lesions in his bladder dome.   7.13.2023: He underwent biopsy of his trigonal area as well as the papillary lesions. Path--Benign submucosa with chronic inflammation . He has a very thin bladder wall, and gemcitabine  not administered.He did well w/ Bx. Despite negative pathology, he did have papillary recurrences @ dome of bladder.   8.28.2023: Completed 3 maintenance BCG Rxs.   10.10.2023: Cystoscopy was negative.  Cytology inconclusive.   12.19.2023: Completed third round of maintenance BCG   2.13.2024: Cysto negative, cytology + for HG urothelial carcinoma.   3.18.2024: Cysto, bladder biopsies, (B) RGPs. RGPs nml, path--Benign urothelium and submucosa with chronic inflammation and reactive changes    5.9.2024: Completed maintenance BCG  6.18.2024: Cysto negative, cytology atypical/inconclusive.  11.26.2024: completed 5th round of maintenance BCG  1.7.2025: Here for routine check.  He had a hip replacement to refracture while playing pickle ball in the early fall.  He is doing quite well.  No real issues from his BCG.  Past Medical History:  Diagnosis Date   Abnormal liver function tests 05/03/2011   Arthritis    Bladder cancer (HCC) 02/2021   high grade nonmuscle invasive bladder cancer   BPH  (benign prostatic hypertrophy) 05/03/2011   Elevated transaminase level    GERD (gastroesophageal reflux disease)    Hairy cell leukemia, in remission (HCC) 1992   remission since 2008   IBS (irritable bowel syndrome)    Irritable bowel syndrome    2 yrs ago, states he had inflammation around anus. Biopsy: inclusive   Lumbar foraminal stenosis 12/31/2019   Lumbar radiculopathy 11/15/2019   Sleep apnea     Past Surgical History:  Procedure Laterality Date   BACK SURGERY     BONE MARROW BIOPSY     2007 and 1992   CATARACT EXTRACTION W/PHACO Right 08/15/2020   Procedure: CATARACT EXTRACTION PHACO AND INTRAOCULAR LENS PLACEMENT RIGHT EYE;  Surgeon: Harrie Agent, MD;  Location: [facility];  Service: Ophthalmology;  Laterality: Right;  right CDE=7.75   COLONOSCOPY  04/19/2006   with polyps   COLONOSCOPY  09/30/2011   Procedure: COLONOSCOPY;  Surgeon: Claudis RAYMOND Rivet, MD;  Location: [facility];  Service: Endoscopy;  Laterality: N/A;  730   COLONOSCOPY N/A 01/11/2019   Procedure: COLONOSCOPY;  Surgeon: Rivet Claudis RAYMOND, MD;  Location: [facility];  Service: Endoscopy;  Laterality: N/A;  100-office move to 9:30am   CYSTOSCOPY W/ RETROGRADES Bilateral 02/23/2021   Procedure: CYSTOSCOPY WITH RETROGRADE PYELOGRAM;  Surgeon: Matilda Senior, MD;  Location: [facility];  Service: Urology;  Laterality: Bilateral;   CYSTOSCOPY W/ RETROGRADES Bilateral 07/05/2022   Procedure: CYSTOSCOPY WITH RETROGRADE PYELOGRAM;  Surgeon: Matilda Senior, MD;  Location: [facility];  Service: Urology;  Laterality: Bilateral;   CYSTOSCOPY WITH BIOPSY N/A 07/05/2022   Procedure: CYSTOSCOPY WITH BIOPSY;  Surgeon: Matilda Senior, MD;  Location: [facility];  Service: Urology;  Laterality: N/A;  30 MINS   HX COLON POLYPS     TONSILLECTOMY     childhood   TOTAL HIP ARTHROPLASTY Right 12/19/2022   Procedure: RIGHT POSTERIOR TOTAL HIP ARTHROPLASTY;  Surgeon:  Edna Toribio LABOR, MD;  Location: [facility];  Service: Orthopedics;  Laterality: Right;   TRANSFORAMINAL LUMBAR INTERBODY FUSION W/ MIS 1 LEVEL Right 11/18/2020   Procedure: Right Lumbar Five-Sacral One Minimally invasive transforaminal lumbar interbody fusion;  Surgeon: Cheryle Debby LABOR, MD;  Location: [facility];  Service: Neurosurgery;  Laterality: Right;   TRANSURETHRAL RESECTION OF BLADDER TUMOR N/A 03/26/2021   Procedure: REPEAT TRANSURETHRAL RESECTION OF BLADDER TUMOR (TURBT)/ POST OPERATIVE INSTILLATION OF GEMCITABINE ;  Surgeon: Matilda Senior, MD;  Location: [facility];  Service: Urology;  Laterality: N/A;   TRANSURETHRAL RESECTION OF BLADDER TUMOR WITH MITOMYCIN -C N/A 02/23/2021   Procedure: TRANSURETHRAL RESECTION OF BLADDER TUMOR;  Surgeon: Matilda Senior, MD;  Location: [facility];  Service: Urology;  Laterality: N/A;   TRANSURETHRAL RESECTION OF BLADDER TUMOR WITH MITOMYCIN -C N/A 10/29/2021   Procedure: TRANSURETHRAL RESECTION OF BLADDER TUMOR WITH GEMCITABINE ;  Surgeon: Matilda Senior, MD;  Location: [facility];  Service: Urology;  Laterality: N/A;    Home Medications:  Allergies as of 04/26/2023       Reactions   Penicillins Swelling, Other (See Comments)   Pollen Extract         Medication List        Accurate as of April 25, 2023 11:00 AM. If you have any questions, ask your nurse or doctor.          Clear Eyes Seasonal Relief 0.012-0.2-0.25 % Soln Generic drug: Naphazoline-Glycerin -Zinc  Sulf Place 1 drop into both eyes daily as needed (allergies).   clonazePAM  0.5 MG tablet Commonly known as: KLONOPIN  Take 0.5 mg by mouth daily as needed.   diphenhydrAMINE  25 mg capsule Commonly known as: BENADRYL  Take 25-50 mg by mouth every 6 (six) hours as needed for allergies or sleep.   donepezil  10 MG tablet Commonly known as: ARICEPT  Take 5 mg by mouth daily.   doxylamine  (Sleep) 25 MG tablet Commonly  known as: UNISOM  Take 25 mg by mouth at bedtime as needed for sleep.   finasteride  5 MG tablet Commonly known as: PROSCAR  Take 1 tablet (5 mg total) by mouth every other day.   GINKOBA PO Take 120 mg by mouth daily. Stopped will restart after surgery   ibuprofen 200 MG tablet Commonly known as: ADVIL Take 400 mg by mouth every 6 (six) hours as needed (Inflammation).   LEG CRAMPS PM SL Place 2 tablets under the tongue daily as needed (leg cramps).   levocetirizine 5 MG tablet Commonly known as: XYZAL Take 5 mg by mouth at bedtime as needed for allergies.   loperamide 2 MG capsule Commonly known as: IMODIUM Take 2 mg by mouth as needed for diarrhea or loose stools.   One-A-Day Mens 50+ Tabs Take 1 tablet by mouth daily.   polyethylene glycol 17 g packet Commonly known as: MIRALAX  / GLYCOLAX  Take 17 g by mouth daily as needed for mild constipation.   rosuvastatin  10 MG tablet Commonly known as: CRESTOR  Take 10 mg by mouth at bedtime.   senna-docusate 8.6-50 MG tablet Commonly known as: Senokot-S Take 1 tablet by mouth at bedtime as needed for moderate constipation.   simethicone  125 MG chewable tablet Commonly known as: MYLICON Chew 125 mg by mouth 2 (two) times daily as  needed for flatulence.   tamsulosin  0.4 MG Caps capsule Commonly known as: FLOMAX  Take 1 capsule (0.4 mg total) by mouth at bedtime. What changed: when to take this        Allergies:  Allergies  Allergen Reactions   Penicillins Swelling and Other (See Comments)   Pollen Extract     No family history on file.  Social History:  reports that he quit smoking about 17 years ago. His smoking use included cigarettes. He has never used smokeless tobacco. He reports current alcohol use of about 4.0 standard drinks of alcohol per week. He reports that he does not use drugs.  ROS: A complete review of systems was performed.  All systems are negative except for pertinent findings as  noted.  Physical Exam:  Vital signs in last 24 hours: There were no vitals taken for this visit. Constitutional:  Alert and oriented, No acute distress Cardiovascular: Regular rate  Respiratory: Normal respiratory effort Neurologic: Grossly intact, no focal deficits Psychiatric: Normal mood and affect  I have reviewed prior pt notes  I have reviewed urinalysis results  I have independently reviewed prior imaging  I have reviewed prior PSA results  I have reviewed prior u pathology results  Most recent op note reviewed  Cystoscopy Procedure Note:  Indication: Bladder cancer surveillance  After informed consent and discussion of the procedure and its risks, MISCHA POLLARD was positioned and prepped in the standard fashion.  Cystoscopy was performed with a flexible cystoscope.   Findings: Urethra: No lesions/stricture Prostate: Minimally obstructing Bladder neck: No contracture Ureteral orifices: Normal bilaterally Bladder: Urothelium normal except for prior biopsy sites.  Slightly erythematous, no papillary or nodular structures.   The patient tolerated the procedure well.      Impression/Assessment:  Treated recurrent high-grade nonmuscle invasive bladder cancer.  Following positive cytology most recent biopsy in March 2024 was negative.  Upper tracts normal.  Cystoscopy today unchanged  Plan:  1.  I sent voided urine for cytology  2.  I will see him back in 4 months for his next cystoscopy, no further BCG

## 2023-04-26 ENCOUNTER — Ambulatory Visit (INDEPENDENT_AMBULATORY_CARE_PROVIDER_SITE_OTHER): Payer: 59 | Admitting: Urology

## 2023-04-26 ENCOUNTER — Encounter: Payer: Self-pay | Admitting: Urology

## 2023-04-26 VITALS — BP 164/77 | HR 73

## 2023-04-26 DIAGNOSIS — N138 Other obstructive and reflux uropathy: Secondary | ICD-10-CM

## 2023-04-26 DIAGNOSIS — R972 Elevated prostate specific antigen [PSA]: Secondary | ICD-10-CM

## 2023-04-26 DIAGNOSIS — Z8551 Personal history of malignant neoplasm of bladder: Secondary | ICD-10-CM | POA: Diagnosis not present

## 2023-04-26 DIAGNOSIS — C678 Malignant neoplasm of overlapping sites of bladder: Secondary | ICD-10-CM

## 2023-04-26 DIAGNOSIS — N401 Enlarged prostate with lower urinary tract symptoms: Secondary | ICD-10-CM

## 2023-04-26 LAB — URINALYSIS, ROUTINE W REFLEX MICROSCOPIC
Bilirubin, UA: NEGATIVE
Glucose, UA: NEGATIVE
Ketones, UA: NEGATIVE
Nitrite, UA: NEGATIVE
Protein,UA: NEGATIVE
RBC, UA: NEGATIVE
Specific Gravity, UA: 1.01 (ref 1.005–1.030)
Urobilinogen, Ur: 0.2 mg/dL (ref 0.2–1.0)
pH, UA: 6 (ref 5.0–7.5)

## 2023-04-26 LAB — MICROSCOPIC EXAMINATION
Bacteria, UA: NONE SEEN
Epithelial Cells (non renal): NONE SEEN /[HPF] (ref 0–10)
RBC, Urine: NONE SEEN /[HPF] (ref 0–2)

## 2023-04-26 MED ORDER — CIPROFLOXACIN HCL 500 MG PO TABS
500.0000 mg | ORAL_TABLET | Freq: Once | ORAL | Status: AC
  Administered 2023-04-26: 500 mg via ORAL

## 2023-04-27 LAB — CYTOLOGY, URINE

## 2023-05-04 ENCOUNTER — Other Ambulatory Visit: Payer: Self-pay | Admitting: Urology

## 2023-05-04 DIAGNOSIS — C678 Malignant neoplasm of overlapping sites of bladder: Secondary | ICD-10-CM

## 2023-05-09 IMAGING — CT CT ABD-PELV W/O CM
2 of 4 series · 17 of 46 positions shown, 19 images · non-contrast
Comparison: None.

CLINICAL DATA: Hematuria, recurrent UTIs.

EXAM:
CT ABDOMEN AND PELVIS WITHOUT CONTRAST
TECHNIQUE: Multidetector CT imaging of the abdomen and pelvis was performed
following the standard protocol without IV contrast.

[Series 2: axial st · axial · 0.79mm/px · z∈[+1060,+1445]mm · 14 of 89 slices shown, 16 images]
[im 6/89  soft-tissue]
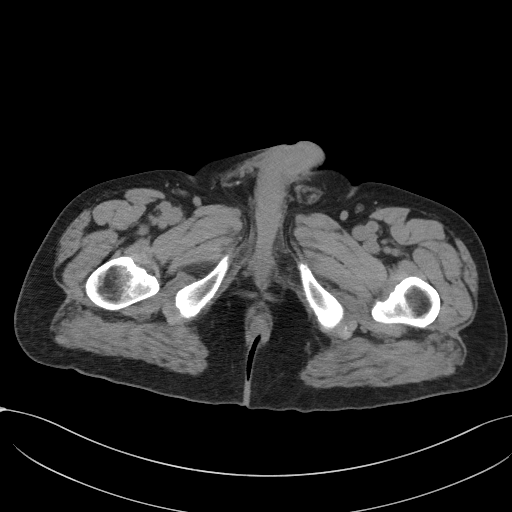
[im 6/89  bone]
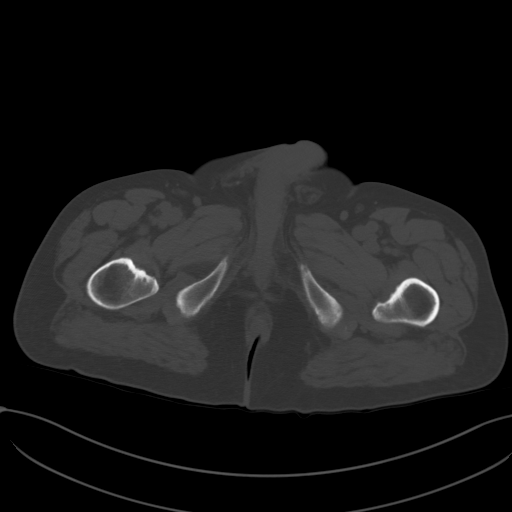
[im 12/89  soft-tissue]
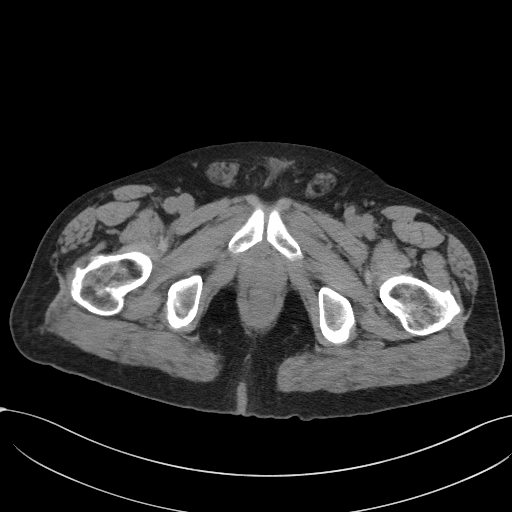
[im 17/89  soft-tissue]
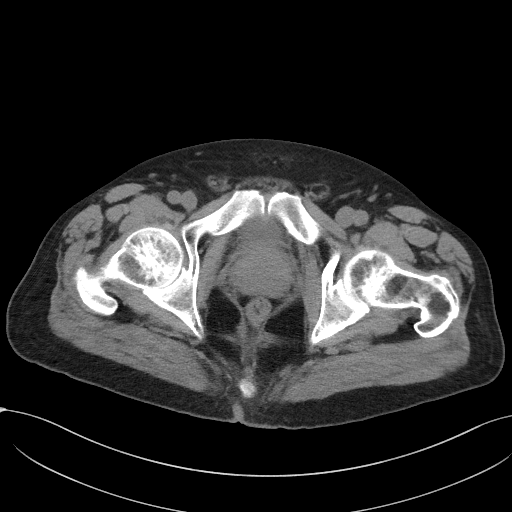
[im 23/89  soft-tissue]
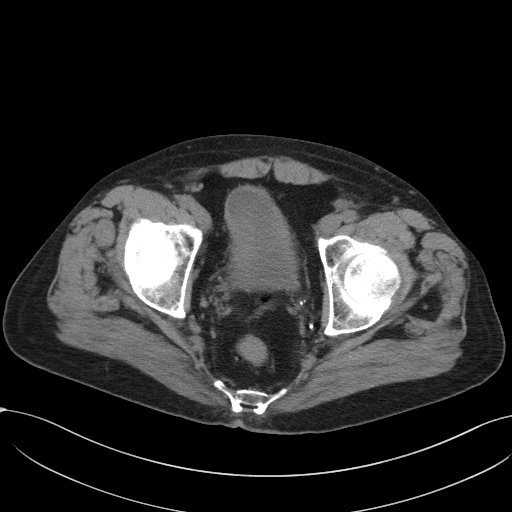
[im 28/89  soft-tissue]
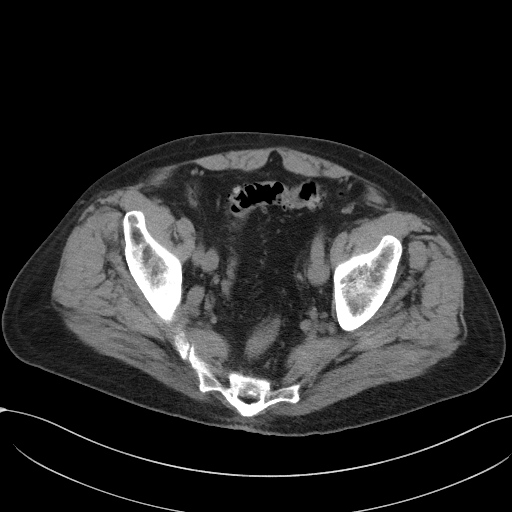
[im 34/89  soft-tissue]
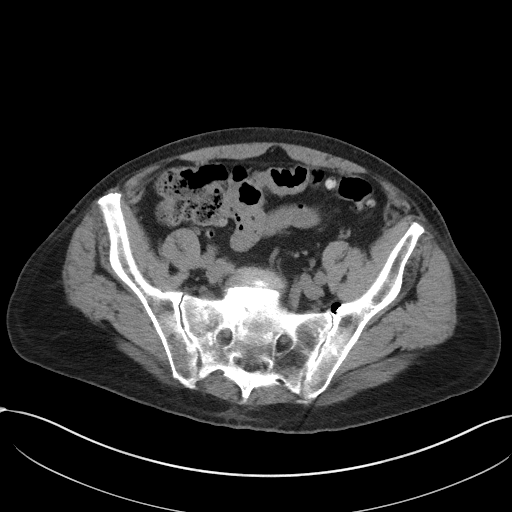
[im 39/89  soft-tissue]
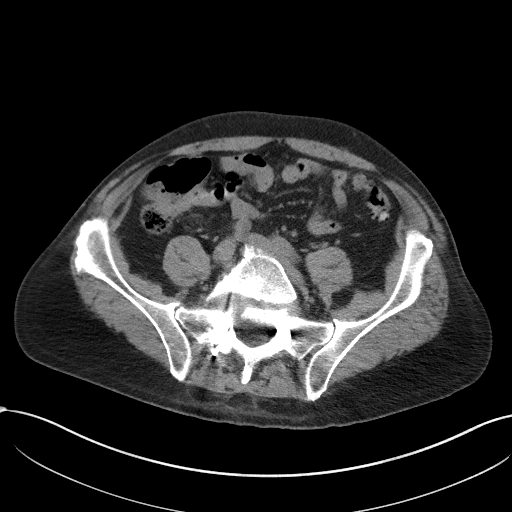
[im 50/89  soft-tissue]
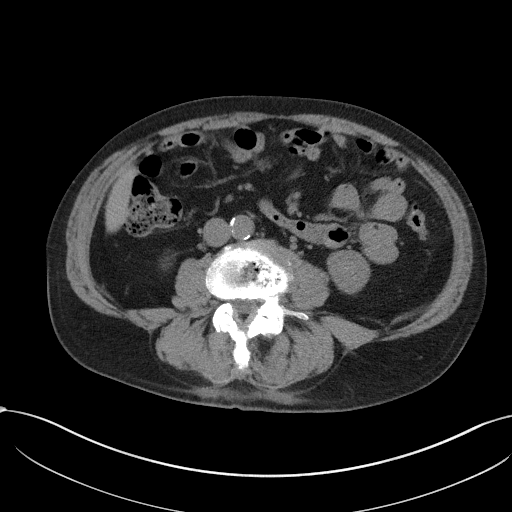
[im 56/89  soft-tissue]
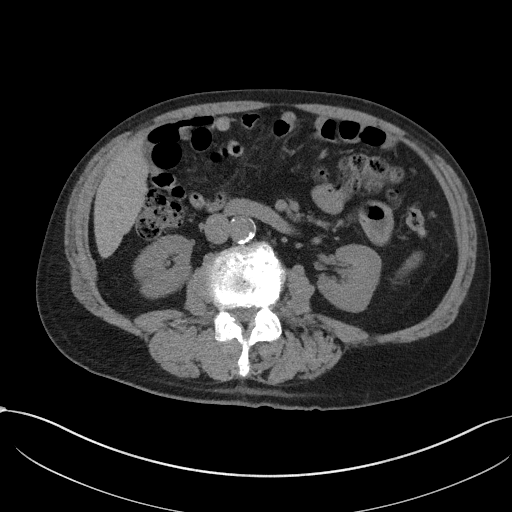
[im 56/89  bone]
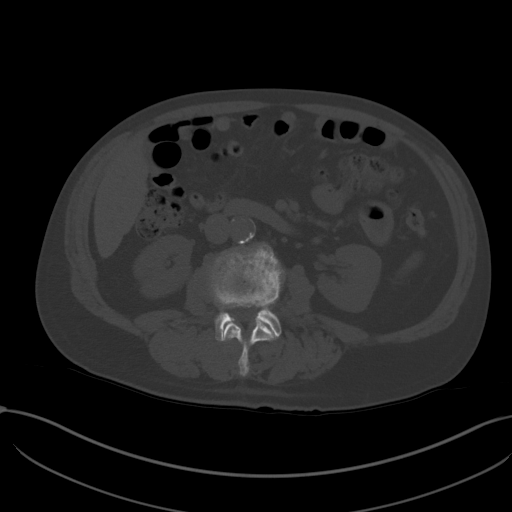
[im 61/89  soft-tissue]
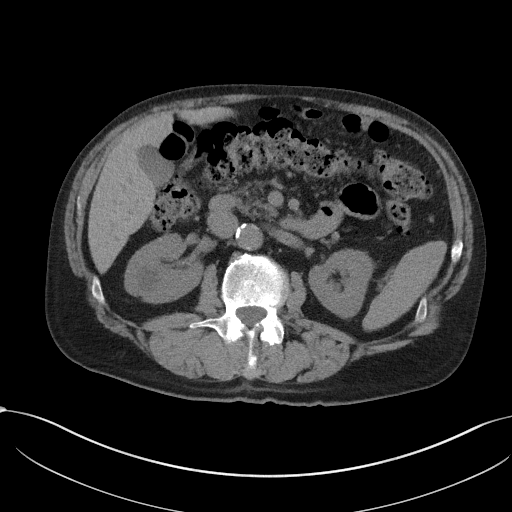
[im 67/89  soft-tissue]
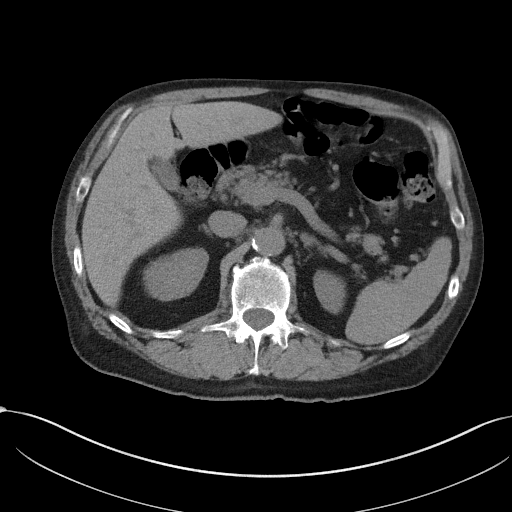
[im 72/89  soft-tissue]
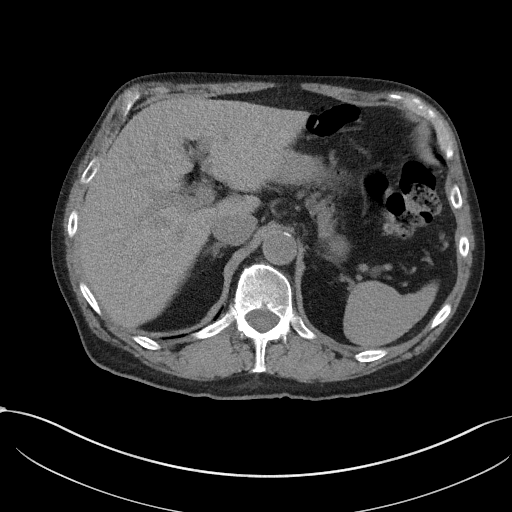
[im 78/89  soft-tissue]
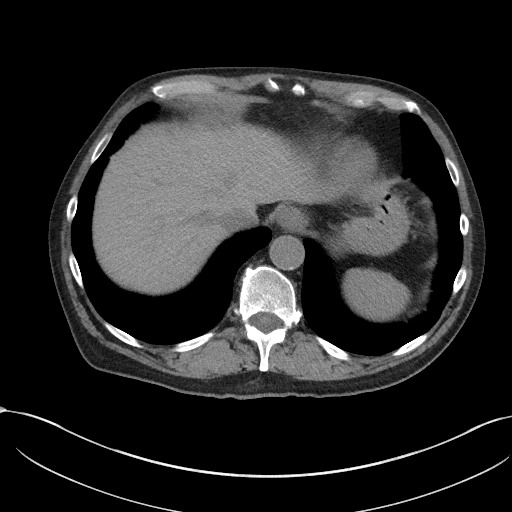
[im 83/89  soft-tissue]
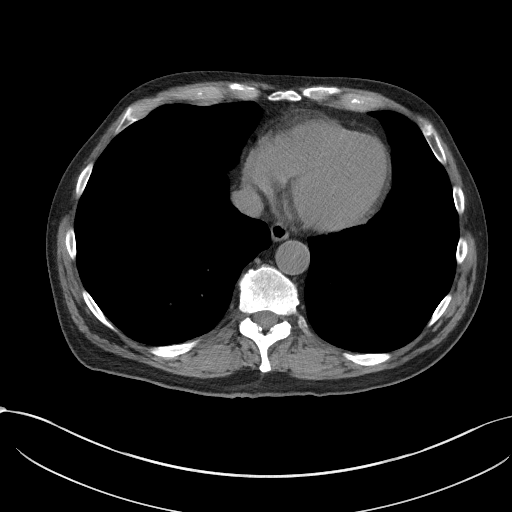

[Series 5: coronal st · coronal · 0.87mm/px · 3 of 141 slices shown]
[im 47/141  soft-tissue]
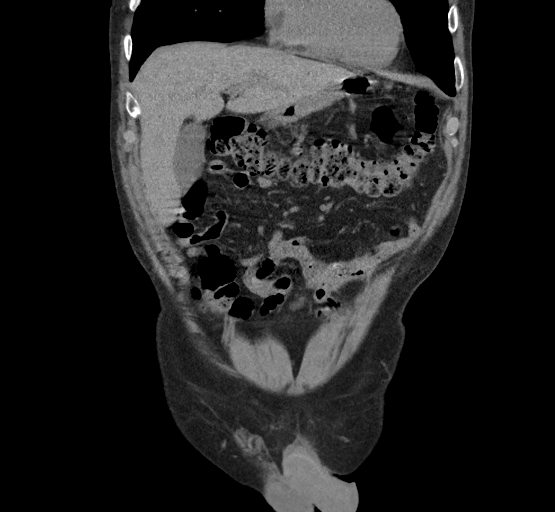
[im 63/141  soft-tissue]
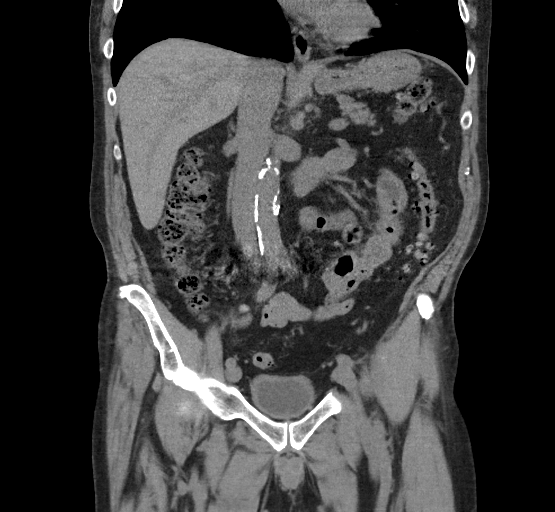
[im 78/141  soft-tissue]
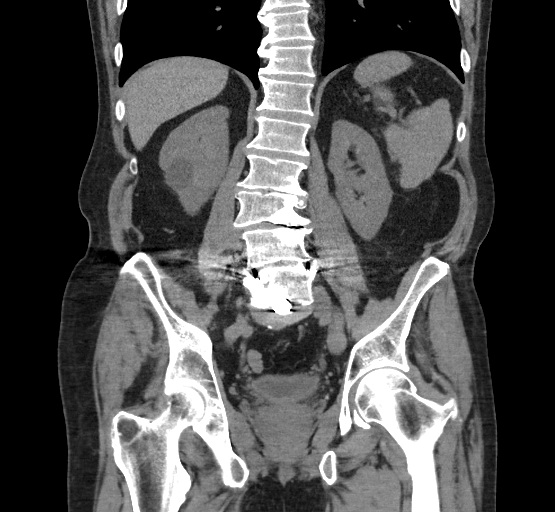

[17 of 46 positions shown; findings below may reference images not displayed]

FINDINGS: Lower chest: Lung bases are clear. Heart size normal. No pericardial
or pleural effusion. Distal esophagus is unremarkable.

Hepatobiliary: Liver and gallbladder are unremarkable. No biliary
ductal dilatation.

Pancreas: Negative.

Spleen: Negative.

Adrenals/Urinary Tract: Adrenal glands are unremarkable.
Low-attenuation lesion in the interpolar right kidney measures
cm, compatible with a cyst. No urinary stones. Kidneys are otherwise
unremarkable. Ureters are decompressed. Bladder is somewhat
thick-walled.

Stomach/Bowel: Stomach is decompressed. Small bowel, appendix and
colon are unremarkable.

Vascular/Lymphatic: Atherosclerotic calcification of the aorta. No
pathologically enlarged lymph nodes.

Reproductive: Prostate is mildly prominent.

Other: No free fluid.  Mesenteries and peritoneum are unremarkable.

Musculoskeletal: Postoperative and degenerative changes in the
spine. No worrisome lytic or sclerotic lesions.
IMPRESSION: 1. Slightly enlarged prostate. Associated bladder wall thickening is
indicative of an element of outlet obstruction.
2.  Aortic atherosclerosis ([id number]).

## 2023-07-03 ENCOUNTER — Ambulatory Visit
Admission: EM | Admit: 2023-07-03 | Discharge: 2023-07-03 | Disposition: A | Discharge status: Home / Self Care | Attending: Nurse Practitioner | Admitting: Nurse Practitioner

## 2023-07-03 DIAGNOSIS — H1132 Conjunctival hemorrhage, left eye: Secondary | ICD-10-CM | POA: Diagnosis not present

## 2023-07-03 NOTE — Discharge Instructions (Addendum)
 May use over-the-counter Tylenol as needed for pain or discomfort. May apply warm compresses to the eye to help with pain or discomfort, cool compresses to help with pain or swelling. May use over-the-counter Visine or Clear Eyes to help keep the eye moist and lubricated. Recommend use of sunglasses to protect the eye while symptoms persist. Follow-up with your ophthalmologist immediately if you experience visual changes, pain, swelling, or worsening symptoms. Follow-up as needed.

## 2023-07-03 NOTE — ED Provider Notes (Signed)
 [facility]    CSN: [id number] Arrival date & time: 07/03/23  1359      History   Chief Complaint No chief complaint on file.   HPI Cameron Wu is a 73 y.o. male.   The history is provided by the patient.   Patient presents for complaints of bleeding in the left eye.  Patient states symptoms started over the past 24 hours.  He states that he did cough prior to his symptoms starting.  He denies injury, trauma, visual changes, headache, eye drainage, light sensitivity, swelling, or pain.  Patient reports that he does wear eyeglasses.  Denies use of blood thinning medications.  Past Medical History:  Diagnosis Date   Abnormal liver function tests 05/03/2011   Arthritis    Bladder cancer (HCC) 02/2021   high grade nonmuscle invasive bladder cancer   BPH (benign prostatic hypertrophy) 05/03/2011   Elevated transaminase level    GERD (gastroesophageal reflux disease)    Hairy cell leukemia, in remission (HCC) 1992   remission since 2008   IBS (irritable bowel syndrome)    Irritable bowel syndrome    2 yrs ago, states he had inflammation around anus. Biopsy: inclusive   Lumbar foraminal stenosis 12/31/2019   Lumbar radiculopathy 11/15/2019   Sleep apnea     Patient Active Problem List   Diagnosis Date Noted   Closed right hip fracture (HCC) 12/17/2022   Bladder cancer (HCC) 03/26/2021   Lumbar radiculopathy 11/18/2020   Hx of colonic polyps 10/26/2018   Hairy cell leukemia, in remission (HCC) 05/03/2011   IBS (irritable bowel syndrome) 05/03/2011   Benign prostatic hyperplasia 05/03/2011   Abnormal liver function tests 05/03/2011    Past Surgical History:  Procedure Laterality Date   BACK SURGERY     BONE MARROW BIOPSY     2007 and 1992   CATARACT EXTRACTION W/PHACO Right 08/15/2020   Procedure: CATARACT EXTRACTION PHACO AND INTRAOCULAR LENS PLACEMENT RIGHT EYE;  Surgeon: Fabio Pierce, MD;  Location: [facility];  Service: Ophthalmology;  Laterality:  Right;  right CDE=7.75   COLONOSCOPY  04/19/2006   with polyps   COLONOSCOPY  09/30/2011   Procedure: COLONOSCOPY;  Surgeon: Malissa Hippo, MD;  Location: [facility];  Service: Endoscopy;  Laterality: N/A;  730   COLONOSCOPY N/A 01/11/2019   Procedure: COLONOSCOPY;  Surgeon: Malissa Hippo, MD;  Location: [facility];  Service: Endoscopy;  Laterality: N/A;  100-office move to 9:30am   CYSTOSCOPY W/ RETROGRADES Bilateral 02/23/2021   Procedure: CYSTOSCOPY WITH RETROGRADE PYELOGRAM;  Surgeon: Marcine Matar, MD;  Location: [facility];  Service: Urology;  Laterality: Bilateral;   CYSTOSCOPY W/ RETROGRADES Bilateral 07/05/2022   Procedure: CYSTOSCOPY WITH RETROGRADE PYELOGRAM;  Surgeon: Marcine Matar, MD;  Location: [facility];  Service: Urology;  Laterality: Bilateral;   CYSTOSCOPY WITH BIOPSY N/A 07/05/2022   Procedure: CYSTOSCOPY WITH BIOPSY;  Surgeon: Marcine Matar, MD;  Location: [facility];  Service: Urology;  Laterality: N/A;  30 MINS   HX COLON POLYPS     TONSILLECTOMY     childhood   TOTAL HIP ARTHROPLASTY Right 12/19/2022   Procedure: RIGHT POSTERIOR TOTAL HIP ARTHROPLASTY;  Surgeon: Joen Laura, MD;  Location: [facility];  Service: Orthopedics;  Laterality: Right;   TRANSFORAMINAL LUMBAR INTERBODY FUSION W/ MIS 1 LEVEL Right 11/18/2020   Procedure: Right Lumbar Five-Sacral One Minimally invasive transforaminal lumbar interbody fusion;  Surgeon: Jadene Pierini, MD;  Location: [facility];  Service: Neurosurgery;  Laterality: Right;   TRANSURETHRAL RESECTION OF BLADDER TUMOR N/A 03/26/2021   Procedure: REPEAT TRANSURETHRAL RESECTION OF BLADDER TUMOR (TURBT)/ POST OPERATIVE INSTILLATION OF GEMCITABINE;  Surgeon: Marcine Matar, MD;  Location: [facility];  Service: Urology;  Laterality: N/A;   TRANSURETHRAL RESECTION OF BLADDER TUMOR WITH MITOMYCIN-C N/A 02/23/2021   Procedure: TRANSURETHRAL RESECTION  OF BLADDER TUMOR;  Surgeon: Marcine Matar, MD;  [facility]  Service: Urology;  Laterality: N/A;   TRANSURETHRAL RESECTION OF BLADDER TUMOR WITH MITOMYCIN-C N/A 10/29/2021   Procedure: TRANSURETHRAL RESECTION OF BLADDER TUMOR WITH GEMCITABINE;  Surgeon: Marcine Matar, MD;  Location: [facility];  Service: Urology;  Laterality: N/A;       Home Medications    Prior to Admission medications   Medication Sig Start Date End Date Taking? Authorizing Provider  clonazePAM (KLONOPIN) 0.5 MG tablet Take 0.5 mg by mouth daily as needed. 11/22/22   [provider]  diphenhydrAMINE (BENADRYL) 25 mg capsule Take 25-50 mg by mouth every 6 (six) hours as needed for allergies or sleep.    [provider]  donepezil (ARICEPT) 10 MG tablet Take 5 mg by mouth daily.    [provider]  doxylamine, Sleep, (UNISOM) 25 MG tablet Take 25 mg by mouth at bedtime as needed for sleep.    [provider]  finasteride (PROSCAR) 5 MG tablet Take 1 tablet (5 mg total) by mouth every other day. 02/28/23   Marcine Matar, MD  Ginkgo Biloba (GINKOBA PO) Take 120 mg by mouth daily. Stopped will restart after surgery    [provider]  Homeopathic Products (LEG CRAMPS PM SL) Place 2 tablets under the tongue daily as needed (leg cramps).    [provider]  ibuprofen (ADVIL,MOTRIN) 200 MG tablet Take 400 mg by mouth every 6 (six) hours as needed (Inflammation).    [provider]  levocetirizine (XYZAL) 5 MG tablet Take 5 mg by mouth at bedtime as needed for allergies.    [provider]  loperamide (IMODIUM) 2 MG capsule Take 2 mg by mouth as needed for diarrhea or loose stools.    [provider]  Multiple Vitamins-Minerals (ONE-A-DAY MENS 50+) TABS Take 1 tablet by mouth daily.    [provider]  Naphazoline-Glycerin-Zinc Sulf (CLEAR EYES SEASONAL RELIEF) 0.012-0.2-0.25 % SOLN Place 1  drop into both eyes daily as needed (allergies).    [provider]  polyethylene glycol (MIRALAX / GLYCOLAX) 17 g packet Take 17 g by mouth daily as needed for mild constipation. 12/20/22   Amin, Ankit C, MD  rosuvastatin (CRESTOR) 10 MG tablet Take 10 mg by mouth at bedtime.    [provider]  senna-docusate (SENOKOT-S) 8.6-50 MG tablet Take 1 tablet by mouth at bedtime as needed for moderate constipation. 12/20/22   Amin, Ankit C, MD  simethicone (MYLICON) 125 MG chewable tablet Chew 125 mg by mouth 2 (two) times daily as needed for flatulence.    [provider]  tamsulosin (FLOMAX) 0.4 MG CAPS capsule Take 1 capsule (0.4 mg total) by mouth at bedtime. Patient taking differently: Take 0.4 mg by mouth every other day. 02/02/22   Marcine Matar, MD    Family History History reviewed. No pertinent family history.  Social History Social History   Tobacco Use   Smoking status: Former    Current packs/day: 0.00    Types: Cigarettes    Quit date: 12/18/2005    Years since  quitting: 17.5   Smokeless tobacco: Never  Vaping Use   Vaping status: Never Used  Substance Use Topics   Alcohol use: Yes    Alcohol/week: 4.0 standard drinks of alcohol    Types: 4 Cans of beer per week    Comment: social   Drug use: No     Allergies   Penicillins and Pollen extract   Review of Systems Review of Systems Per HPI  Physical Exam Triage Vital Signs ED Triage Vitals  Encounter Vitals Group     BP 07/03/23 1431 (!) 152/73     Systolic BP Percentile --      Diastolic BP Percentile --      Pulse Rate 07/03/23 1431 (!) 54     Resp 07/03/23 1431 18     Temp 07/03/23 1431 97.9 F (36.6 C)     Temp Source 07/03/23 1431 Oral     SpO2 07/03/23 1431 98 %     Weight --      Height --      Head Circumference --      Peak Flow --      Pain Score 07/03/23 1433 0     Pain Loc --      Pain Education --      Exclude from Growth Chart --    No data found.  Updated  Vital Signs BP (!) 152/73 (BP Location: Right Arm)   Pulse (!) 54   Temp 97.9 F (36.6 C) (Oral)   Resp 18   SpO2 98%   Visual Acuity Right Eye Distance:   Left Eye Distance:   Bilateral Distance:    Right Eye Near:   Left Eye Near:    Bilateral Near:     Physical Exam Vitals and nursing note reviewed.  Constitutional:      General: He is not in acute distress.    Appearance: Normal appearance.  HENT:     Head: Normocephalic.     Right Ear: Tympanic membrane, ear canal and external ear normal.     Left Ear: Tympanic membrane, ear canal and external ear normal.     Nose: Nose normal.     Mouth/Throat:     Mouth: Mucous membranes are moist.  Eyes:     General: Lids are normal. Vision grossly intact. No visual field deficit.       Left eye: No foreign body, discharge or hordeolum.     Extraocular Movements: Extraocular movements intact.     Right eye: Normal extraocular motion and no nystagmus.     Left eye: Normal extraocular motion and no nystagmus.     Conjunctiva/sclera:     Left eye: Hemorrhage present.     Pupils: Pupils are equal, round, and reactive to light.  Pulmonary:     Effort: Pulmonary effort is normal.  Musculoskeletal:     Cervical back: Normal range of motion.  Skin:    General: Skin is warm and dry.  Neurological:     General: No focal deficit present.     Mental Status: He is alert and oriented to person, place, and time.  Psychiatric:        Mood and Affect: Mood normal.        Behavior: Behavior normal.      UC Treatments / Results  Labs (all labs ordered are listed, but only abnormal results are displayed) Labs Reviewed - No data to display  EKG   Radiology No results found.  Procedures Procedures (  including critical care time)  Medications Ordered in UC Medications - No data to display  Initial Impression / Assessment and Plan / UC Course  I have reviewed the triage vital signs and the nursing notes.  Pertinent labs &  imaging results that were available during my care of the patient were reviewed by me and considered in my medical decision making (see chart for details).  Subconjunctival hemorrhage noted to the left eye.  Patient is not experiencing any pain, visual changes, or drainage from the eye.  Visual acuity is intact.  Supportive care recommendations were provided and discussed with the patient to include warm compresses, and use of over-the-counter eyedrops such as Clear Eyes or Visine.  Discussed indications with patient regarding follow-up.  Patient was in agreement with this plan of care and verbalized understanding.  All questions were answered.  Patient stable for discharge.  Final Clinical Impressions(s) / UC Diagnoses   Final diagnoses:  None   Discharge Instructions   None    ED Prescriptions   None    PDMP not reviewed this encounter.   Abran Cantor, NP 07/03/23 1510

## 2023-07-03 NOTE — ED Triage Notes (Signed)
 Pt reports bleeding in the left eye. Pt states he first noticed it yesterday morning, denies pain and vision changes.

## 2023-08-10 ENCOUNTER — Other Ambulatory Visit: Payer: Self-pay | Admitting: Urology

## 2023-08-10 DIAGNOSIS — R972 Elevated prostate specific antigen [PSA]: Secondary | ICD-10-CM

## 2023-08-23 ENCOUNTER — Other Ambulatory Visit: Payer: Self-pay

## 2023-08-23 DIAGNOSIS — C9141 Hairy cell leukemia, in remission: Secondary | ICD-10-CM

## 2023-08-23 NOTE — Progress Notes (Signed)
 HEMATOLOGY/ONCOLOGY CLINIC NOTE  Date of Service: 08/24/2023  Patient Care Team: Artemisa Bile, MD as PCP - General (Internal Medicine)  CHIEF COMPLAINTS/PURPOSE OF CONSULTATION:  continued evaluation and management of his Hairy Cell Leukemia.  Oncologic History:   Cameron Wu was initially diagnosed with Hairy Cell Leukemia in January 1992, at which time he presented with constitutional symptoms and massive splenomegaly. He was treated with Cladribine by special exception protocol from the [facility] before the drug was officially FDA approved. He had progression in July 1995, and ws treated again with Cladribine. The pt then progressed again in June 2007, and was treated with Cladribine and Rituxan. He has remained in remission since that time.  HISTORY OF PRESENTING ILLNESS:   Cameron Wu is a wonderful 73 y.o. male who has been referred to us  by Dr. Harvel Linear for evaluation and management of his Hairy Cell Leukemia. The pt reports that he is doing well overall.  The pt reports that he has not developed any new concerns since his last visit with Dr. Isidor Marek a month ago. He denies abdominal pains or worsened energy levels, fevers, chills, night sweats, or unexpected weight loss. The pt notes that he has some chronic right shoulder and knee pain, residual from a fall about a year ago. He denies concerns for recent infections. He has followed with his PCP for is flu and pneumonia vaccinations.  The pt notes that each time he received Cladribine, he received a dose each day for 5 consecutive days in an outpatient setting. The pt denies any major side effects from the chemotherapy, but did have cytopenias.   The pt notes that at his last relapse in 2007, he did not have significant symptoms, but was seen to develop abnormal blood counts, and was confirmed with a BM Bx.  He denies swallowing problems, heart issues, lung issues, or liver problems. He notes  that he has a "nervous stomach which growls for hours after eating." He sees Dr. Joie Narrow in Urology for his BPH and takes Finasteride . The pt notes social alcohol use and denies ever being drunk.  Most recent lab results (07/03/18) of CBC w/diff is as follows: all values are WNL.  On review of systems, pt reports good energy levels, eating well, stable weight, and denies fevers, chills, night sweats, unexpected weight loss, swallowing difficulty, breathing difficulty, leg swelling, skin rashes or lesions, concern for infections, mouth sores, pain along the spine, lower abdominal pains, leg swelling, noticing any new lumps or bumps, and any other symptoms.   On PMHx the pt reports Hairy cell leukemia, environmental allergies  INTERVAL HISTORY:  Cameron Wu is a wonderful 73 y.o. male who is here for continued evaluation and management of his Hairy Cell Leukemia.   Patient was last seen by me on 08/24/2022 and reported bladder cancer recurrence in October 2023. He complained of dysuria due to BCG treatment, short-term memory loss, and lower abdominal pain.  Today, he reports that he has  Bladder   [place] next week   Dr Leavy Prow did not have equipment for blue light testoscopy so he is going to [place]. Lane[place] testoscopy  Last cytology was in February which was still positive. Trying to read primary tumor, re assessin g to see any reminence there or  Patinet rpeorts that there are plans for 4 different procedure [facility] next tueday  -answered   PCP Fagan 6 month check up generally.  Labs showed lower in sepetember  of las year hgb 10 - hip surgery. These from 2 weeks ago,   -blood loss from surgery in september  -of note labs 2 weeks ago today , platleets normalized, today 154K, WBC still onrmal, no inc lymphoctyres form HCL stdpt  Kidney number creatiinine 1.59 on paper Today about same September was normal  In last 8-9 months some change bothering kidneys Eval by  PCP?  Med, pain med, prostate site, element urinary obstruction?  Sep and spril no change in kidney function labs  -hgb normal 12  -cbc no concern rn -kidney concern  Problemes emptying the bladder yes. Still on flomax  not improveing enough (Urinary obstruction)  Enlarged prostate no disciusses  He complains of bladder issues   He noticed a change September 2024. He noticed needing to push harder and more frequently to empty bladder. Met urologisit since September, and then decided set up [place] next week   Non e of other parameters in kideneys changed. BUN if elevated dehydrated or some other  Isolated increase in createinine suggests obstrctive element.   Tumor block causing obstruction, or inflmamation yes  No blood in urine, onbvious pink urine?  After last surgery, blood in urine for one week, which then resolved.  No other fever, chills night sweats  He complains of upper abdominal pain Dr. Hildy Lowers put her on acid suppresment pantoprezole which helping symptoms.   He drinks baclk tea frewquently.  -tea can generlaly cause reflux or gastritis  -recommend not to drink tea on empty stomach limit to some extent  -ibuprofen can be rough on kidneys and stomach -fagan said stop ibuprofen. B4 then, on average 3 days a week 2 tablets.  Since weather change (for a few months)  Sugggested tylenol  ( not bother kidney stomach) last week   No black stools, blood in stool, nose bleed, gum in bleed, obvious blood in urine recently   -no real concern for HCL  Skin lesions carcinoma swumous clel top of head second one same area removed, following with dermatologisit.   Topical cream efudix 5fuleruracil? Only aquaphor.   -sometimes if several sport and can't get dep enough, chemo cream. Tho he not  -bladder follow [facility]    -liekly an obstructive componenet casusing kidney number - once obsturcteiuve element taken care of, PCP enure kidney numbers  normalize again  Seeing Dr. Kiki Pelton on 5/27 .   invasive bladder cancer, or metastiat then us . generally   High grade non muscle invasive bladder cancer - BCG surgery with urology.   Paitnet preorts 6 rounds BCG not significantly effective  There may be a need for Priary area biopsied or second area need to be recepted.   -30 % not responsive bcg ttis ...  Unsure they think prostate tight.   He is on Femasitidie and flomaz - so may be they think enlarged prostate.   Patient complains of continues soreness His fall   If sitting for extended period,   He continues to stay physically active by walking his dog.   Mild discomfort in upper belly  -no enlarged spleen  He denies leg swelling  Calf cramping sometimes at night, primaruly in right lower extremities.   He generally tries to stay well-hydrated.   -answered all of patient's questions in detail  -once a year  Generally follows with his PCP twice a year.   -continue to follow with urologist for bladder cancer treatment  Cytolo pos despite BCG rec in primary tumor???  Look at  note...  -if within out realm and muscle invasive require us  to gt involved      -Discussed lab results on 08/24/2023 in detail with patient. CBC showed WBC of 4.9K, hemoglobin of 12.0, and platelets of 154K. -        Urologist, Dr. Trent Frizzle.   Patient notes his PCP has increased his Donepezil  dose in January been helping his memory symptoms.   MEDICAL HISTORY:  Past Medical History:  Diagnosis Date   Abnormal liver function tests 05/03/2011   Arthritis    Bladder cancer (HCC) 02/2021   high grade nonmuscle invasive bladder cancer   BPH (benign prostatic hypertrophy) 05/03/2011   Elevated transaminase level    GERD (gastroesophageal reflux disease)    Hairy cell leukemia, in remission (HCC) 1992   remission since 2008   IBS (irritable bowel syndrome)    Irritable bowel syndrome    2 yrs ago, states  he had inflammation around anus. Biopsy: inclusive   Lumbar foraminal stenosis 12/31/2019   Lumbar radiculopathy 11/15/2019   Sleep apnea     SURGICAL HISTORY: Past Surgical History:  Procedure Laterality Date   BACK SURGERY     BONE MARROW BIOPSY     2007 and 1992   CATARACT EXTRACTION W/PHACO Right 08/15/2020   Procedure: CATARACT EXTRACTION PHACO AND INTRAOCULAR LENS PLACEMENT RIGHT EYE;  Surgeon: Tarri Farm, MD;  Location: [facility];  Service: Ophthalmology;  Laterality: Right;  right CDE=7.75   COLONOSCOPY  04/19/2006   with polyps   COLONOSCOPY  09/30/2011   Procedure: COLONOSCOPY;  Surgeon: Ruby Corporal, MD;  Location: [facility];  Service: Endoscopy;  Laterality: N/A;  730   COLONOSCOPY N/A 01/11/2019   Procedure: COLONOSCOPY;  Surgeon: Ruby Corporal, MD;  Location: [facility];  Service: Endoscopy;  Laterality: N/A;  100-office move to 9:30am   CYSTOSCOPY W/ RETROGRADES Bilateral 02/23/2021   Procedure: CYSTOSCOPY WITH RETROGRADE PYELOGRAM;  Surgeon: Trent Frizzle, MD;  Location[facility];  Service: Urology;  Laterality: Bilateral;   CYSTOSCOPY W/ RETROGRADES Bilateral 07/05/2022   Procedure: CYSTOSCOPY WITH RETROGRADE PYELOGRAM;  Surgeon: Trent Frizzle, MD;  Location: [facility];  Service: Urology;  Laterality: Bilateral;   CYSTOSCOPY WITH BIOPSY N/A 07/05/2022   Procedure: CYSTOSCOPY WITH BIOPSY;  Surgeon: Trent Frizzle, MD;  Location[facility];  Service: Urology;  Laterality: N/A;  30 MINS   HX COLON POLYPS     TONSILLECTOMY     childhood   TOTAL HIP ARTHROPLASTY Right 12/19/2022   Procedure: RIGHT POSTERIOR TOTAL HIP ARTHROPLASTY;  Surgeon: Murleen Arms, MD;  Location: [facility];  Service: Orthopedics;  Laterality: Right;   TRANSFORAMINAL LUMBAR INTERBODY FUSION W/ MIS 1 LEVEL Right 11/18/2020   Procedure: Right Lumbar Five-Sacral One Minimally invasive transforaminal lumbar interbody fusion;   Surgeon: Cannon Champion, MD;  Location: [facility];  Service: Neurosurgery;  Laterality: Right;   TRANSURETHRAL RESECTION OF BLADDER TUMOR N/A 03/26/2021   Procedure: REPEAT TRANSURETHRAL RESECTION OF BLADDER TUMOR (TURBT)/ POST OPERATIVE INSTILLATION OF GEMCITABINE ;  Surgeon: Trent Frizzle, MD;  Location: [facility];  Service: Urology;  Laterality: N/A;   TRANSURETHRAL RESECTION OF BLADDER TUMOR WITH MITOMYCIN -C N/A 02/23/2021   Procedure: TRANSURETHRAL RESECTION OF BLADDER TUMOR;  Surgeon: Trent Frizzle, MD;  Location: [facility];  Service: Urology;  Laterality: N/A;   TRANSURETHRAL RESECTION OF BLADDER TUMOR WITH MITOMYCIN -C N/A 10/29/2021   Procedure: TRANSURETHRAL RESECTION OF BLADDER TUMOR WITH GEMCITABINE ;  Surgeon: Trent Frizzle, MD;  Location: [facility];  Service: Urology;  Laterality: N/A;    SOCIAL HISTORY: Social History   Socioeconomic History   Marital status: Married    Spouse name: Not on file   Number of children: Not on file   Years of education: Not on file   Highest education level: Not on file  Occupational History   Not on file  Tobacco Use   Smoking status: Former    Current packs/day: 0.00    Types: Cigarettes    Quit date: 12/18/2005    Years since quitting: 17.6   Smokeless tobacco: Never  Vaping Use   Vaping status: Never Used  Substance and Sexual Activity   Alcohol use: Yes    Alcohol/week: 4.0 standard drinks of alcohol    Types: 4 Cans of beer per week    Comment: social   Drug use: No   Sexual activity: Not on file  Other Topics Concern   Not on file  Social History Narrative   Not on file   Social Drivers of Health   Financial Resource Strain: Not on file  Food Insecurity: No Food Insecurity (12/17/2022)   Hunger Vital Sign    Worried About Running Out of Food in the Last Year: Never true    Ran Out of Food in the Last Year: Never true  Transportation Needs: No Transportation  Needs (12/17/2022)   PRAPARE - Administrator, Civil Service (Medical): No    Lack of Transportation (Non-Medical): No  Physical Activity: Not on file  Stress: Not on file  Social Connections: Not on file  Intimate Partner Violence: Not At Risk (12/17/2022)   Humiliation, Afraid, Rape, and Kick questionnaire    Fear of Current or Ex-Partner: No    Emotionally Abused: No    Physically Abused: No    Sexually Abused: No    FAMILY HISTORY: No family history on file.  ALLERGIES:  is allergic to penicillins and pollen extract.  MEDICATIONS:  Current Outpatient Medications  Medication Sig Dispense Refill   clonazePAM  (KLONOPIN ) 0.5 MG tablet Take 0.5 mg by mouth daily as needed.     diphenhydrAMINE  (BENADRYL ) 25 mg capsule Take 25-50 mg by mouth every 6 (six) hours as needed for allergies or sleep.     donepezil  (ARICEPT ) 10 MG tablet Take 5 mg by mouth daily.     doxylamine , Sleep, (UNISOM ) 25 MG tablet Take 25 mg by mouth at bedtime as needed for sleep.     finasteride  (PROSCAR ) 5 MG tablet Take 1 tablet (5 mg total) by mouth every other day. 90 tablet 3   Ginkgo Biloba (GINKOBA PO) Take 120 mg by mouth daily. Stopped will restart after surgery     Homeopathic Products (LEG CRAMPS PM SL) Place 2 tablets under the tongue daily as needed (leg cramps).     ibuprofen (ADVIL,MOTRIN) 200 MG tablet Take 400 mg by mouth every 6 (six) hours as needed (Inflammation).     levocetirizine (XYZAL) 5 MG tablet Take 5 mg by mouth at bedtime as needed for allergies.     loperamide (IMODIUM) 2 MG capsule Take 2 mg by mouth as needed for diarrhea or loose stools.     Multiple Vitamins-Minerals (ONE-A-DAY MENS 50+) TABS Take 1 tablet by mouth daily.     Naphazoline-Glycerin -Zinc  Sulf (CLEAR EYES SEASONAL RELIEF) 0.012-0.2-0.25 % SOLN Place 1 drop into both eyes daily as needed (allergies).     polyethylene glycol (MIRALAX  /  GLYCOLAX ) 17 g packet Take 17 g by mouth daily as needed for mild  constipation. 14 each 0   rosuvastatin  (CRESTOR ) 10 MG tablet Take 10 mg by mouth at bedtime.     senna-docusate (SENOKOT-S) 8.6-50 MG tablet Take 1 tablet by mouth at bedtime as needed for moderate constipation. 30 tablet 0   simethicone (MYLICON) 125 MG chewable tablet Chew 125 mg by mouth 2 (two) times daily as needed for flatulence.     tamsulosin  (FLOMAX ) 0.4 MG CAPS capsule Take 1 capsule (0.4 mg total) by mouth every other day. 90 capsule 3   No current facility-administered medications for this visit.   Facility-Administered Medications Ordered in Other Visits  Medication Dose Route Frequency Provider Last Rate Last Admin   gemcitabine  (GEMZAR ) chemo syringe for bladder instillation 2,000 mg  2,000 mg Bladder Instillation Once Dahlstedt, Stephen, MD        REVIEW OF SYSTEMS:    10 Point review of Systems was done is negative except as noted above.   PHYSICAL EXAMINATION: ECOG PERFORMANCE STATUS: 1 - Symptomatic but completely ambulatory  . There were no vitals filed for this visit.  There were no vitals filed for this visit.  .There is no height or weight on file to calculate BMI.    GENERAL:alert, in no acute distress and comfortable SKIN: no acute rashes, no significant lesions EYES: conjunctiva are pink and non-injected, sclera anicteric OROPHARYNX: MMM, no exudates, no oropharyngeal erythema or ulceration NECK: supple, no JVD LYMPH:  no palpable lymphadenopathy in the cervical, axillary or inguinal regions LUNGS: clear to auscultation b/l with normal respiratory effort HEART: regular rate & rhythm ABDOMEN:  normoactive bowel sounds , non tender, not distended. Extremity: no pedal edema PSYCH: alert & oriented x 3 with fluent speech NEURO: no focal motor/sensory deficits   LABORATORY DATA:  I have reviewed the data as listed  .    Latest Ref Rng & Units 12/20/2022    3:44 AM 12/19/2022    3:15 AM 12/18/2022    3:08 AM  CBC  WBC 4.0 - 10.5 K/uL 14.7  7.5  9.5    Hemoglobin 13.0 - 17.0 g/dL 40.9  81.1  91.4   Hematocrit 39.0 - 52.0 % 30.5  35.8  36.8   Platelets 150 - 400 K/uL 108  105  114     .    Latest Ref Rng & Units 12/20/2022    3:44 AM 12/19/2022    3:15 AM 12/18/2022    3:08 AM  CMP  Glucose 70 - 99 mg/dL 782  956  213   BUN 8 - 23 mg/dL 16  13  11    Creatinine 0.61 - 1.24 mg/dL 0.86  5.78  4.69   Sodium 135 - 145 mmol/L 134  137  139   Potassium 3.5 - 5.1 mmol/L 4.0  3.6  3.7   Chloride 98 - 111 mmol/L 102  101  105   CO2 22 - 32 mmol/L 25  27  26    Calcium  8.9 - 10.3 mg/dL 8.2  8.4  8.7     10/16/50 BM Bx:   12/02/05 Flow Cytometry:     RADIOGRAPHIC STUDIES: I have personally reviewed the radiological images as listed and agreed with the findings in the report. No results found.  ASSESSMENT & PLAN:   73 y.o. male with  1. Hairy cell leukemia, currently in 3rd remission Diagnosed in January 1992 presented with constitutional symptoms and massive splenomegaly. S/p Cladribine July  1995 progression  S/p Cladribine June 2007 progression S/p Cladribine and Rituxan  2. Newly diagnosed bladder cancer-- had TURBT and BCG-- folloiws with [facility]. Mx per urology  PLAN: -discussed lab results from today, 08/24/2022, with the patient. CBC and CMP stable.  -Recommended to start taking probiotics to see if it helps his gastrointestinal problems.  -Continue to follow-up with Urologist and PCP. -The pt shows no clinical or lab progression/return of his hairy cell leukemia at this time.  -No indication for further treatment at this time. -Will see back in 12 months with labs.  FOLLOW-UP: ***  The total time spent in the appointment was *** minutes* .  All of the patient's questions were answered with apparent satisfaction. The patient knows to call the clinic with any problems, questions or concerns.   Jacquelyn Matt MD MS AAHIVMS [facility] Hematology/Oncology Physician [facility]  .*Total Encounter  Time as defined by the Centers for Medicare and Medicaid Services includes, in addition to the face-to-face time of a patient visit (documented in the note above) non-face-to-face time: obtaining and reviewing outside history, ordering and reviewing medications, tests or procedures, care coordination (communications with other health care professionals or caregivers) and documentation in the medical record.    I,Mitra Faeizi,acting as a Neurosurgeon for Jacquelyn Matt, MD.,have documented all relevant documentation on the behalf of Jacquelyn Matt, MD,as directed by  Jacquelyn Matt, MD while in the presence of Jacquelyn Matt, MD.  ***

## 2023-08-24 ENCOUNTER — Inpatient Hospital Stay (HOSPITAL_BASED_OUTPATIENT_CLINIC_OR_DEPARTMENT_OTHER): Payer: Medicare HMO | Admitting: Hematology

## 2023-08-24 ENCOUNTER — Inpatient Hospital Stay: Payer: Medicare HMO | Attending: Hematology

## 2023-08-24 VITALS — BP 186/83 | HR 70 | Temp 98.1°F | Resp 18 | Ht 72.0 in | Wt 177.5 lb

## 2023-08-24 DIAGNOSIS — C9141 Hairy cell leukemia, in remission: Secondary | ICD-10-CM | POA: Insufficient documentation

## 2023-08-24 DIAGNOSIS — Z85828 Personal history of other malignant neoplasm of skin: Secondary | ICD-10-CM | POA: Insufficient documentation

## 2023-08-24 DIAGNOSIS — Z87891 Personal history of nicotine dependence: Secondary | ICD-10-CM | POA: Diagnosis not present

## 2023-08-24 DIAGNOSIS — C679 Malignant neoplasm of bladder, unspecified: Secondary | ICD-10-CM | POA: Insufficient documentation

## 2023-08-24 LAB — CMP (CANCER CENTER ONLY)
ALT: 19 U/L (ref 0–44)
AST: 23 U/L (ref 15–41)
Albumin: 4.5 g/dL (ref 3.5–5.0)
Alkaline Phosphatase: 67 U/L (ref 38–126)
Anion gap: 7 (ref 5–15)
BUN: 17 mg/dL (ref 8–23)
CO2: 31 mmol/L (ref 22–32)
Calcium: 9.3 mg/dL (ref 8.9–10.3)
Chloride: 103 mmol/L (ref 98–111)
Creatinine: 1.55 mg/dL — ABNORMAL HIGH (ref 0.61–1.24)
GFR, Estimated: 47 mL/min — ABNORMAL LOW (ref >60)
Glucose, Bld: 97 mg/dL (ref 70–99)
Potassium: 3.7 mmol/L (ref 3.5–5.1)
Sodium: 141 mmol/L (ref 135–145)
Total Bilirubin: 0.8 mg/dL (ref 0.0–1.2)
Total Protein: 7.2 g/dL (ref 6.5–8.1)

## 2023-08-24 LAB — CBC WITH DIFFERENTIAL (CANCER CENTER ONLY)
Abs Immature Granulocytes: 0.02 10*3/uL (ref 0.00–0.07)
Basophils Absolute: 0 10*3/uL (ref 0.0–0.1)
Basophils Relative: 0 %
Eosinophils Absolute: 0 10*3/uL (ref 0.0–0.5)
Eosinophils Relative: 1 %
HCT: 34.7 % — ABNORMAL LOW (ref 39.0–52.0)
Hemoglobin: 12 g/dL — ABNORMAL LOW (ref 13.0–17.0)
Immature Granulocytes: 0 %
Lymphocytes Relative: 27 %
Lymphs Abs: 1.3 10*3/uL (ref 0.7–4.0)
MCH: 32.2 pg (ref 26.0–34.0)
MCHC: 34.6 g/dL (ref 30.0–36.0)
MCV: 93 fL (ref 80.0–100.0)
Monocytes Absolute: 0.3 10*3/uL (ref 0.1–1.0)
Monocytes Relative: 6 %
Neutro Abs: 3.2 10*3/uL (ref 1.7–7.7)
Neutrophils Relative %: 66 %
Platelet Count: 154 10*3/uL (ref 150–400)
RBC: 3.73 MIL/uL — ABNORMAL LOW (ref 4.22–5.81)
RDW: 13.4 % (ref 11.5–15.5)
WBC Count: 4.9 10*3/uL (ref 4.0–10.5)
nRBC: 0 % (ref 0.0–0.2)

## 2023-08-24 LAB — LACTATE DEHYDROGENASE: LDH: 208 U/L — ABNORMAL HIGH (ref 98–192)

## 2023-08-30 ENCOUNTER — Other Ambulatory Visit: Payer: 59 | Admitting: Urology

## 2023-09-01 ENCOUNTER — Telehealth: Payer: Self-pay

## 2023-09-01 NOTE — Telephone Encounter (Signed)
 Patient state's he  had surgery at [facility] and a cath was placed, Patient is requesting a voiding trial to get cath removed. Patient is aware a message will be sent to Dr. Joie Narrow on voiding trial or cath removal and once MD review someone will reach back out with Dr. Joie Narrow recommendations. Patient voiced understqanding.

## 2023-09-02 NOTE — Telephone Encounter (Signed)
 Patient is made aware of voiding trial scheduled for 09/07/2023. Patient voiced understanding  OK--why not put him on Tuesday

## 2023-09-07 ENCOUNTER — Telehealth: Payer: Self-pay

## 2023-09-07 ENCOUNTER — Ambulatory Visit (INDEPENDENT_AMBULATORY_CARE_PROVIDER_SITE_OTHER)

## 2023-09-07 DIAGNOSIS — C678 Malignant neoplasm of overlapping sites of bladder: Secondary | ICD-10-CM | POA: Diagnosis not present

## 2023-09-07 LAB — BLADDER SCAN AMB NON-IMAGING: Scan Result: 4

## 2023-09-07 NOTE — Progress Notes (Signed)
 Fill and Pull Catheter Removal  Patient is present today for a catheter removal.  280 ml of sterile water  was instilled into the bladder when the patient felt the urge to urinate. 10 ml of water  was then drained from the balloon.  A 18 silicone coude FR foley cath was removed from the bladder no complications were noted .  Foley catheter intact and time of removal. Patient as then given some time to void on their own.  Patient can void  275 ml on their own after some time.  Patient tolerated well.  One oral prophylactic antibiotic given per MD orders  Performed by: Gorden Latino, CMA  Follow up/ Additional notes: Come back at 1:30 pm for PVR check

## 2023-09-07 NOTE — Telephone Encounter (Signed)
-----   Message from Malcolm Scrivener Dahlstedt sent at 09/07/2023  2:11 PM EDT ----- Regarding: RE: Cysto He does not need a cystoscopy with me.  I will see him.  However, he may be following up with [facility] from now on. ----- Message ----- From: Audra Blend, CMA Sent: 09/07/2023   1:45 PM EDT To: Cameron Frizzle, MD Subject: Cysto                                          Patient is scheduled to see you for a cysto on Sep 13, 2023. Patient had a cysto on May 13 at [facility] and was wondering if you can review his records. Do  he need to keep his cysto  appt for May 27? Patient came in today for postop voiding trial and passed successful. Patient was scheduled with Sarah for a 1 month PVR check.

## 2023-09-07 NOTE — Telephone Encounter (Signed)
 Patient made aware of MD response and voiced understanding.

## 2023-09-12 NOTE — Progress Notes (Signed)
 History of Present Illness: Cameron Wu is here for followup of bladder cancer.   11.7.2022: TURBT-  Pathology-high-grade nonmuscle invasive bladder cancer.--6.5 cm posterior wall lesion.  Muscularis present in specimen.  He did not receive gemcitabine .   12.8.2022: Repeat TURBT--8 mm Lt anterior wall papillary lesion, repeat posterior bladder wall lesion resection. Both specimens revealed HG NMIBC. Gemcitabine  administered.   2.8.2023: Induction BCG (6 rxs) completed.   3.21.2023: Here for follow-up with cystoscopy.    5.10.2023: Completed first BCG maintenance   6.27.2023: Here for surveillance cystoscopy.  Significant erythematous area in his trigonal region as well as 2-3 papillary lesions in his bladder dome.   7.13.2023: He underwent biopsy of his trigonal area as well as the papillary lesions. Path--Benign submucosa with chronic inflammation . He has a very thin bladder wall, and gemcitabine  not administered.He did well w/ Bx. Despite negative pathology, he did have papillary recurrences @ dome of bladder.   8.28.2023: Completed 3 maintenance BCG Rxs.   10.10.2023: Cystoscopy was negative.  Cytology inconclusive.   12.19.2023: Completed third round of maintenance BCG   2.13.2024: Cysto negative, cytology + for HG urothelial carcinoma.   3.18.2024: Cysto, bladder biopsies, (B) RGPs. RGPs nml, path--Benign urothelium and submucosa with chronic inflammation and reactive changes    5.9.2024: Completed maintenance BCG  6.18.2024: Cysto negative, cytology atypical/inconclusive.  11.26.2024: completed 5th round of maintenance BCG  1.7.2025: Here for routine check.  He had a hip replacement to refracture while playing pickle ball in the early fall.  He is doing quite well.  No real issues from his BCG.  Past Medical History:  Diagnosis Date   Abnormal liver function tests 05/03/2011   Arthritis    Bladder cancer (HCC) 02/2021   high grade nonmuscle invasive bladder cancer   BPH  (benign prostatic hypertrophy) 05/03/2011   Elevated transaminase level    GERD (gastroesophageal reflux disease)    Hairy cell leukemia, in remission (HCC) 1992   remission since 2008   IBS (irritable bowel syndrome)    Irritable bowel syndrome    2 yrs ago, states he had inflammation around anus. Biopsy: inclusive   Lumbar foraminal stenosis 12/31/2019   Lumbar radiculopathy 11/15/2019   Sleep apnea     Past Surgical History:  Procedure Laterality Date   BACK SURGERY     BONE MARROW BIOPSY     2007 and 1992   CATARACT EXTRACTION W/PHACO Right 08/15/2020   Procedure: CATARACT EXTRACTION PHACO AND INTRAOCULAR LENS PLACEMENT RIGHT EYE;  Surgeon: Tarri Farm, MD;  Location: [facility];  Service: Ophthalmology;  Laterality: Right;  right CDE=7.75   COLONOSCOPY  04/19/2006   with polyps   COLONOSCOPY  09/30/2011   Procedure: COLONOSCOPY;  Surgeon: Ruby Corporal, MD;  Location: [facility];  Service: Endoscopy;  Laterality: N/A;  730   COLONOSCOPY N/A 01/11/2019   Procedure: COLONOSCOPY;  Surgeon: Ruby Corporal, MD;  Location: [facility];  Service: Endoscopy;  Laterality: N/A;  100-office move to 9:30am   CYSTOSCOPY W/ RETROGRADES Bilateral 02/23/2021   Procedure: CYSTOSCOPY WITH RETROGRADE PYELOGRAM;  Surgeon: Trent Frizzle, MD;  Location: [facility];  Service: Urology;  Laterality: Bilateral;   CYSTOSCOPY W/ RETROGRADES Bilateral 07/05/2022   Procedure: CYSTOSCOPY WITH RETROGRADE PYELOGRAM;  Surgeon: Trent Frizzle, MD;  Location[facility];  Service: Urology;  Laterality: Bilateral;   CYSTOSCOPY WITH BIOPSY N/A 07/05/2022   Procedure: CYSTOSCOPY WITH BIOPSY;  Surgeon: Trent Frizzle, MD;  Location: [facility];  Service: Urology;  Laterality: N/A;  30 MINS   HX COLON POLYPS     TONSILLECTOMY     childhood   TOTAL HIP ARTHROPLASTY Right 12/19/2022   Procedure: RIGHT POSTERIOR TOTAL HIP ARTHROPLASTY;  Surgeon:  Murleen Arms, MD;  Location: [facility];  Service: Orthopedics;  Laterality: Right;   TRANSFORAMINAL LUMBAR INTERBODY FUSION W/ MIS 1 LEVEL Right 11/18/2020   Procedure: Right Lumbar Five-Sacral One Minimally invasive transforaminal lumbar interbody fusion;  Surgeon: Cannon Champion, MD;  Location: [facility];  Service: Neurosurgery;  Laterality: Right;   TRANSURETHRAL RESECTION OF BLADDER TUMOR N/A 03/26/2021   Procedure: REPEAT TRANSURETHRAL RESECTION OF BLADDER TUMOR (TURBT)/ POST OPERATIVE INSTILLATION OF GEMCITABINE ;  Surgeon: Trent Frizzle, MD;  Location: [facility];  Service: Urology;  Laterality: N/A;   TRANSURETHRAL RESECTION OF BLADDER TUMOR WITH MITOMYCIN -C N/A 02/23/2021   Procedure: TRANSURETHRAL RESECTION OF BLADDER TUMOR;  Surgeon: Trent Frizzle, MD;  Location: [facility];  Service: Urology;  Laterality: N/A;   TRANSURETHRAL RESECTION OF BLADDER TUMOR WITH MITOMYCIN -C N/A 10/29/2021   Procedure: TRANSURETHRAL RESECTION OF BLADDER TUMOR WITH GEMCITABINE ;  Surgeon: Trent Frizzle, MD;  Location: [facility];  Service: Urology;  Laterality: N/A;    Home Medications:  Allergies as of 09/13/2023       Reactions   Penicillins Swelling, Other (See Comments)   Pollen Extract         Medication List        Accurate as of Sep 12, 2023  7:30 PM. If you have any questions, ask your nurse or doctor.          Clear Eyes Seasonal Relief 0.012-0.2-0.25 % Soln Generic drug: Naphazoline-Glycerin -Zinc  Sulf Place 1 drop into both eyes daily as needed (allergies).   clonazePAM  0.5 MG tablet Commonly known as: KLONOPIN  Take 0.5 mg by mouth daily as needed.   diphenhydrAMINE  25 mg capsule Commonly known as: BENADRYL  Take 25-50 mg by mouth every 6 (six) hours as needed for allergies or sleep.   donepezil  10 MG tablet Commonly known as: ARICEPT  Take 5 mg by mouth daily.   doxylamine  (Sleep) 25 MG tablet Commonly known  as: UNISOM  Take 25 mg by mouth at bedtime as needed for sleep.   finasteride  5 MG tablet Commonly known as: PROSCAR  Take 1 tablet (5 mg total) by mouth every other day.   GINKOBA PO Take 120 mg by mouth daily. Stopped will restart after surgery   ibuprofen 200 MG tablet Commonly known as: ADVIL Take 400 mg by mouth every 6 (six) hours as needed (Inflammation).   LEG CRAMPS PM SL Place 2 tablets under the tongue daily as needed (leg cramps).   levocetirizine 5 MG tablet Commonly known as: XYZAL Take 5 mg by mouth at bedtime as needed for allergies.   loperamide 2 MG capsule Commonly known as: IMODIUM Take 2 mg by mouth as needed for diarrhea or loose stools.   One-A-Day Mens 50+ Tabs Take 1 tablet by mouth daily.   polyethylene glycol 17 g packet Commonly known as: MIRALAX  / GLYCOLAX  Take 17 g by mouth daily as needed for mild constipation.   rosuvastatin  10 MG tablet Commonly known as: CRESTOR  Take 10 mg by mouth at bedtime.   senna-docusate 8.6-50 MG tablet Commonly known as: Senokot-S Take 1 tablet by mouth at bedtime as needed for moderate constipation.   simethicone 125 MG chewable tablet Commonly known as: MYLICON Chew 125 mg by mouth 2 (two) times daily  as needed for flatulence.   tamsulosin  0.4 MG Caps capsule Commonly known as: FLOMAX  Take 1 capsule (0.4 mg total) by mouth every other day.        Allergies:  Allergies  Allergen Reactions   Penicillins Swelling and Other (See Comments)   Pollen Extract     No family history on file.  Social History:  reports that he quit smoking about 17 years ago. His smoking use included cigarettes. He has never used smokeless tobacco. He reports current alcohol use of about 4.0 standard drinks of alcohol per week. He reports that he does not use drugs.  ROS: A complete review of systems was performed.  All systems are negative except for pertinent findings as noted.  Physical Exam:  Vital signs in last 24  hours: There were no vitals taken for this visit. Constitutional:  Alert and oriented, No acute distress Cardiovascular: Regular rate  Respiratory: Normal respiratory effort Neurologic: Grossly intact, no focal deficits Psychiatric: Normal mood and affect  I have reviewed prior pt notes  I have reviewed urinalysis results  I have independently reviewed prior imaging  I have reviewed prior PSA results  I have reviewed prior u pathology results  Most recent op note reviewed  Cystoscopy Procedure Note:  Indication: Bladder cancer surveillance  After informed consent and discussion of the procedure and its risks, Cameron Wu was positioned and prepped in the standard fashion.  Cystoscopy was performed with a flexible cystoscope.   Findings: Urethra: No lesions/stricture Prostate: Minimally obstructing Bladder neck: No contracture Ureteral orifices: Normal bilaterally Bladder: Urothelium normal except for prior biopsy sites.  Slightly erythematous, no papillary or nodular structures.   The patient tolerated the procedure well.      Impression/Assessment:  Treated recurrent high-grade nonmuscle invasive bladder cancer.  Following positive cytology most recent biopsy in March 2024 was negative.  Upper tracts normal.  Cystoscopy today unchanged  Plan:

## 2023-09-13 ENCOUNTER — Ambulatory Visit (INDEPENDENT_AMBULATORY_CARE_PROVIDER_SITE_OTHER): Admitting: Urology

## 2023-09-13 VITALS — BP 155/65 | HR 65

## 2023-09-13 DIAGNOSIS — R972 Elevated prostate specific antigen [PSA]: Secondary | ICD-10-CM

## 2023-09-13 DIAGNOSIS — C679 Malignant neoplasm of bladder, unspecified: Secondary | ICD-10-CM | POA: Diagnosis not present

## 2023-09-13 DIAGNOSIS — C678 Malignant neoplasm of overlapping sites of bladder: Secondary | ICD-10-CM

## 2023-09-13 LAB — MICROSCOPIC EXAMINATION: RBC, Urine: >30 /HPF — AB (ref 0–2)

## 2023-09-13 LAB — URINALYSIS, ROUTINE W REFLEX MICROSCOPIC
Bilirubin, UA: NEGATIVE
Glucose, UA: NEGATIVE
Ketones, UA: NEGATIVE
Nitrite, UA: NEGATIVE
Specific Gravity, UA: 1.02 (ref 1.005–1.030)
Urobilinogen, Ur: 0.2 mg/dL (ref 0.2–1.0)
pH, UA: 6 (ref 5.0–7.5)

## 2023-09-16 ENCOUNTER — Telehealth: Payer: Self-pay

## 2023-09-16 NOTE — Telephone Encounter (Signed)
 Pt states MD and him was discussing surgery being transferred from [facility] to [facility] in [place] pt received a message from [facility] stating they scheduled him for surgery on 06/24 pt called and cancelled surgery due to him wanting to be seen in [place] pt would like another referral sent to MD in [place] regarding surgery

## 2023-09-16 NOTE — Telephone Encounter (Signed)
 Call pt and lvm that he has an appt per MD Joie Narrow w/ MD [facility] at 3 on 06/05

## 2023-09-19 ENCOUNTER — Telehealth: Payer: Self-pay

## 2023-09-19 NOTE — Telephone Encounter (Signed)
 Pt called b/c [facility] left a vm for him to f/u up with them on the 5th pt was advised to contact [facility] in [facility] to understand why those appts were set

## 2023-09-27 ENCOUNTER — Other Ambulatory Visit: Payer: Self-pay | Admitting: Urology

## 2023-09-28 NOTE — Progress Notes (Addendum)
 COVID Vaccine received:  []  No [x]  Yes Date of any COVID positive Test in last 90 days:  PCP - Artemisa Bile, MD  Cardiologist -  Hematology- Jacquelyn Matt', MD   Chest x-ray - 12-17-2022  1v  Epic EKG -    09-29-23 Epic Stress Test -  ECHO -  Cardiac Cath -  CT Coronary Calcium  score:   Bowel Prep - [x]  No  []   Yes ______  Pacemaker / ICD device [x]  No []  Yes   Spinal Cord Stimulator:[x]  No []  Yes       History of Sleep Apnea? []  No [x]  Yes   CPAP used?- [x]  No []  Yes    Does the patient monitor blood sugar?   [x]  N/A   []  No []  Yes  Patient has: [x]  NO Hx DM   []  Pre-DM   []  DM1  []   DM2  Blood Thinner / Instructions:  none Aspirin  Instructions: none  ERAS Protocol Ordered: [x]  No  []  Yes Patient is to be NPO after: MN Prior- confirmed with [facility] since patient's surgery is at 1500   Dental hx: []  Dentures:  [x]  N/A      []  Bridge or Partial:                   []  Loose or Damaged teeth:   Activity level: Able to walk up 2 flights of stairs without becoming significantly short of breath or having chest pain?  []  No   [x]    Yes   Anesthesia review: GERD, short term memory loss, OSA-   , anemia, hx hairy cell leukemia in remission, Bladder Cancer- TURBT, CKD3, abn. LFTs,   Patient denies shortness of breath, fever, cough and chest pain at PAT appointment.  Patient verbalized understanding and agreement to the Pre-Surgical Instructions that were given to them at this PAT appointment. Patient was also educated of the need to review these PAT instructions again prior to his surgery.I reviewed the appropriate phone numbers to call if they have any and questions or concerns.

## 2023-09-28 NOTE — Patient Instructions (Signed)
 SURGICAL WAITING ROOM VISITATION Patients having surgery or a procedure may have no more than 2 support people in the waiting area - these visitors may rotate in the visitor waiting room.   If the patient needs to stay at the hospital during part of their recovery, the visitor guidelines for inpatient rooms apply.  PRE-OP VISITATION  Pre-op nurse will coordinate an appropriate time for 1 support person to accompany the patient in pre-op.  This support person may not rotate.  This visitor will be contacted when the time is appropriate for the visitor to come back in the pre-op area.  Please refer to the [facility] website for the visitor guidelines for Inpatients (after your surgery is over and you are in a regular room).  You are not required to quarantine at this time prior to your surgery. However, you must do this: Hand Hygiene often Do NOT share personal items Notify your provider if you are in close contact with someone who has COVID or you develop fever 100.4 or greater, new onset of sneezing, cough, sore throat, shortness of breath or body aches.  If you test positive for Covid or have been in contact with anyone that has tested positive in the last 10 days please notify you surgeon.    Your procedure is scheduled on:  Friday  September 30, 2023  Report to [facility] Main Entrance: [address] entrance where the [employer] is available.   Report to admitting at:   12:45  AM  Call this number if you have any questions or problems the morning of surgery [phone]  DO NOT EAT OR DRINK ANYTHING AFTER MIDNIGHT THE NIGHT PRIOR TO YOUR SURGERY / PROCEDURE.   FOLLOW  ANY ADDITIONAL PRE OP INSTRUCTIONS YOU RECEIVED FROM YOUR SURGEON'S OFFICE!!!   Oral Hygiene is also important to reduce your risk of infection.        Remember - BRUSH YOUR TEETH THE MORNING OF SURGERY WITH YOUR REGULAR TOOTHPASTE  Do NOT smoke after Midnight the night before surgery.  STOP TAKING all Vitamins,  Herbs and supplements 1 week before your surgery.   Take ONLY these medicines the morning of surgery with A SIP OF WATER : Pantoprazole, Donepezil , and Clonazepam  if needed for anxiety. You may take finasteride  and tamsulosin  if it is you normal day to take them.    If You have been diagnosed with Sleep Apnea - Bring CPAP mask and tubing day of surgery. We will provide you with a CPAP machine on the day of your surgery.                   You may not have any metal on your body including  jewelry, and body piercing  Do not wear lotions, powders, cologne, or deodorant  Men may shave face and neck.  Contacts, Hearing Aids, dentures or bridgework may not be worn into surgery. DENTURES WILL BE REMOVED PRIOR TO SURGERY PLEASE DO NOT APPLY Poly grip OR ADHESIVES!!!  Patients discharged on the day of surgery will not be allowed to drive home.  Someone NEEDS to stay with you for the first 24 hours after anesthesia.  Do not bring your home medications to the hospital. The Pharmacy will dispense medications listed on your medication list to you during your admission in the Hospital.  Special Instructions: Bring a copy of your healthcare power of attorney and living will documents the day of surgery, if you wish to have them scanned into your [place]  Medical Records- EPIC  Please read over the following fact sheets you were given: IF YOU HAVE QUESTIONS ABOUT YOUR PRE-OP INSTRUCTIONS, PLEASE CALL [phone].   [place] - Preparing for Surgery Before surgery, you can play an important role.  Because skin is not sterile, your skin needs to be as free of germs as possible.  You can reduce the number of germs on your skin by washing with CHG (chlorahexidine gluconate) soap before surgery.  CHG is an antiseptic cleaner which kills germs and bonds with the skin to continue killing germs even after washing. Please DO NOT use if you have an allergy to CHG or antibacterial soaps.  If your skin becomes  reddened/irritated stop using the CHG and inform your nurse when you arrive at Short Stay. Do not shave (including legs and underarms) for at least 48 hours prior to the first CHG shower.  You may shave your face/neck.  Please follow these instructions carefully:  1.  Shower with CHG Soap the night before surgery and the  morning of surgery.  2.  If you choose to wash your hair, wash your hair first as usual with your normal  shampoo.  3.  After you shampoo, rinse your hair and body thoroughly to remove the shampoo.                             4.  Use CHG as you would any other liquid soap.  You can apply chg directly to the skin and wash.  Gently with a scrungie or clean washcloth.  5.  Apply the CHG Soap to your body ONLY FROM THE NECK DOWN.   Do not use on face/ open                           Wound or open sores. Avoid contact with eyes, ears mouth and genitals (private parts).                       Wash face,  Genitals (private parts) with your normal soap.             6.  Wash thoroughly, paying special attention to the area where your  surgery  will be performed.  7.  Thoroughly rinse your body with warm water  from the neck down.  8.  DO NOT shower/wash with your normal soap after using and rinsing off the CHG Soap.            9.  Pat yourself dry with a clean towel.            10.  Wear clean pajamas.            11.  Place clean sheets on your bed the night of your first shower and do not  sleep with pets.  ON THE DAY OF SURGERY : Do not apply any lotions/deodorants the morning of surgery.  Please wear clean clothes to the hospital/surgery center.    FAILURE TO FOLLOW THESE INSTRUCTIONS MAY RESULT IN THE CANCELLATION OF YOUR SURGERY  PATIENT SIGNATURE_________________________________  NURSE SIGNATURE__________________________________  ________________________________________________________________________

## 2023-09-29 ENCOUNTER — Encounter (HOSPITAL_COMMUNITY)
Admission: RE | Admit: 2023-09-29 | Discharge: 2023-09-29 | Disposition: A | Discharge status: Home / Self Care | Source: Ambulatory Visit | Attending: Urology | Admitting: Urology

## 2023-09-29 ENCOUNTER — Encounter (HOSPITAL_COMMUNITY): Payer: Self-pay

## 2023-09-29 VITALS — BP 124/70 | HR 59 | Temp 98.7°F | Resp 16 | Ht 72.0 in | Wt 173.0 lb

## 2023-09-29 DIAGNOSIS — Z01812 Encounter for preprocedural laboratory examination: Secondary | ICD-10-CM | POA: Diagnosis present

## 2023-09-29 DIAGNOSIS — G4733 Obstructive sleep apnea (adult) (pediatric): Secondary | ICD-10-CM | POA: Diagnosis not present

## 2023-09-29 DIAGNOSIS — Z01810 Encounter for preprocedural cardiovascular examination: Secondary | ICD-10-CM | POA: Diagnosis present

## 2023-09-29 DIAGNOSIS — R7989 Other specified abnormal findings of blood chemistry: Secondary | ICD-10-CM | POA: Diagnosis not present

## 2023-09-29 DIAGNOSIS — Z01818 Encounter for other preprocedural examination: Secondary | ICD-10-CM | POA: Diagnosis not present

## 2023-09-29 DIAGNOSIS — I4519 Other right bundle-branch block: Secondary | ICD-10-CM | POA: Insufficient documentation

## 2023-09-29 HISTORY — DX: Essential (primary) hypertension: I10

## 2023-09-29 HISTORY — DX: Personal history of urinary calculi: Z87.442

## 2023-09-29 HISTORY — DX: Other complications of anesthesia, initial encounter: T88.59XA

## 2023-09-29 HISTORY — DX: Chronic kidney disease, unspecified: N18.9

## 2023-09-29 LAB — COMPREHENSIVE METABOLIC PANEL WITH GFR
ALT: 16 U/L (ref 0–44)
AST: 23 U/L (ref 15–41)
Albumin: 4.1 g/dL (ref 3.5–5.0)
Alkaline Phosphatase: 67 U/L (ref 38–126)
Anion gap: 8 (ref 5–15)
BUN: 16 mg/dL (ref 8–23)
CO2: 28 mmol/L (ref 22–32)
Calcium: 9.2 mg/dL (ref 8.9–10.3)
Chloride: 105 mmol/L (ref 98–111)
Creatinine, Ser: 1.52 mg/dL — ABNORMAL HIGH (ref 0.61–1.24)
GFR, Estimated: 48 mL/min — ABNORMAL LOW (ref >60)
Glucose, Bld: 93 mg/dL (ref 70–99)
Potassium: 3.7 mmol/L (ref 3.5–5.1)
Sodium: 141 mmol/L (ref 135–145)
Total Bilirubin: 1 mg/dL (ref 0.0–1.2)
Total Protein: 7 g/dL (ref 6.5–8.1)

## 2023-09-29 LAB — CBC
HCT: 35.7 % — ABNORMAL LOW (ref 39.0–52.0)
Hemoglobin: 11.3 g/dL — ABNORMAL LOW (ref 13.0–17.0)
MCH: 32.1 pg (ref 26.0–34.0)
MCHC: 31.7 g/dL (ref 30.0–36.0)
MCV: 101.4 fL — ABNORMAL HIGH (ref 80.0–100.0)
Platelets: 132 10*3/uL — ABNORMAL LOW (ref 150–400)
RBC: 3.52 MIL/uL — ABNORMAL LOW (ref 4.22–5.81)
RDW: 14.3 % (ref 11.5–15.5)
WBC: 5.3 10*3/uL (ref 4.0–10.5)
nRBC: 0 % (ref 0.0–0.2)

## 2023-09-30 ENCOUNTER — Ambulatory Visit (HOSPITAL_COMMUNITY)

## 2023-09-30 ENCOUNTER — Ambulatory Visit (HOSPITAL_BASED_OUTPATIENT_CLINIC_OR_DEPARTMENT_OTHER): Admitting: Anesthesiology

## 2023-09-30 ENCOUNTER — Ambulatory Visit (HOSPITAL_COMMUNITY)
Admission: RE | Admit: 2023-09-30 | Discharge: 2023-09-30 | Disposition: A | Discharge status: Home / Self Care | Source: Ambulatory Visit | Attending: Urology | Admitting: Urology

## 2023-09-30 ENCOUNTER — Ambulatory Visit (HOSPITAL_COMMUNITY): Admitting: Anesthesiology

## 2023-09-30 ENCOUNTER — Other Ambulatory Visit: Payer: Self-pay

## 2023-09-30 ENCOUNTER — Encounter (HOSPITAL_COMMUNITY): Payer: Self-pay | Admitting: Urology

## 2023-09-30 ENCOUNTER — Encounter (HOSPITAL_COMMUNITY)
Admission: RE | Disposition: A | Discharge status: Home / Self Care | Payer: Self-pay | Source: Ambulatory Visit | Attending: Urology

## 2023-09-30 DIAGNOSIS — N2889 Other specified disorders of kidney and ureter: Secondary | ICD-10-CM

## 2023-09-30 DIAGNOSIS — I129 Hypertensive chronic kidney disease with stage 1 through stage 4 chronic kidney disease, or unspecified chronic kidney disease: Secondary | ICD-10-CM | POA: Diagnosis not present

## 2023-09-30 DIAGNOSIS — D3001 Benign neoplasm of right kidney: Secondary | ICD-10-CM | POA: Diagnosis present

## 2023-09-30 DIAGNOSIS — Z8551 Personal history of malignant neoplasm of bladder: Secondary | ICD-10-CM | POA: Diagnosis not present

## 2023-09-30 DIAGNOSIS — N189 Chronic kidney disease, unspecified: Secondary | ICD-10-CM | POA: Insufficient documentation

## 2023-09-30 DIAGNOSIS — K219 Gastro-esophageal reflux disease without esophagitis: Secondary | ICD-10-CM | POA: Diagnosis not present

## 2023-09-30 DIAGNOSIS — N400 Enlarged prostate without lower urinary tract symptoms: Secondary | ICD-10-CM | POA: Diagnosis not present

## 2023-09-30 DIAGNOSIS — C651 Malignant neoplasm of right renal pelvis: Secondary | ICD-10-CM | POA: Insufficient documentation

## 2023-09-30 DIAGNOSIS — Z87891 Personal history of nicotine dependence: Secondary | ICD-10-CM | POA: Insufficient documentation

## 2023-09-30 DIAGNOSIS — G4730 Sleep apnea, unspecified: Secondary | ICD-10-CM | POA: Diagnosis not present

## 2023-09-30 DIAGNOSIS — Z87442 Personal history of urinary calculi: Secondary | ICD-10-CM | POA: Insufficient documentation

## 2023-09-30 HISTORY — PX: CYSTOSCOPY WITH URETEROSCOPY AND STENT PLACEMENT: SHX6377

## 2023-09-30 HISTORY — PX: CYSTOSCOPY W/ RETROGRADES: SHX1426

## 2023-09-30 SURGERY — CYSTOURETEROSCOPY, WITH STENT INSERTION
Anesthesia: General | Site: Ureter | Laterality: Right

## 2023-09-30 MED ORDER — IOHEXOL 300 MG/ML  SOLN
INTRAMUSCULAR | Status: DC | PRN
  Administered 2023-09-30: 10 mL

## 2023-09-30 MED ORDER — FENTANYL CITRATE PF 50 MCG/ML IJ SOSY: 25.0000 ug | PREFILLED_SYRINGE | INTRAMUSCULAR | Status: DC | PRN

## 2023-09-30 MED ORDER — FENTANYL CITRATE (PF) 100 MCG/2ML IJ SOLN
INTRAMUSCULAR | Status: AC
Start: 2023-09-30 — End: 2023-09-30
  Filled 2023-09-30: qty 2

## 2023-09-30 MED ORDER — ONDANSETRON HCL 4 MG/2ML IJ SOLN
INTRAMUSCULAR | Status: DC | PRN
  Administered 2023-09-30: 4 mg via INTRAVENOUS

## 2023-09-30 MED ORDER — DOCUSATE SODIUM 100 MG PO CAPS: 100.0000 mg | ORAL_CAPSULE | Freq: Every day | ORAL | 0 refills | Status: DC | PRN

## 2023-09-30 MED ORDER — PROPOFOL 10 MG/ML IV BOLUS
INTRAVENOUS | Status: AC
Start: 2023-09-30 — End: 2023-09-30
  Filled 2023-09-30: qty 20

## 2023-09-30 MED ORDER — ONDANSETRON HCL 4 MG/2ML IJ SOLN
INTRAMUSCULAR | Status: AC
  Filled 2023-09-30: qty 2

## 2023-09-30 MED ORDER — SODIUM CHLORIDE 0.9 % IR SOLN
Status: DC | PRN
  Administered 2023-09-30 (×2): 3000 mL

## 2023-09-30 MED ORDER — PROPOFOL 10 MG/ML IV BOLUS
INTRAVENOUS | Status: DC | PRN
  Administered 2023-09-30: 150 mg via INTRAVENOUS

## 2023-09-30 MED ORDER — LIDOCAINE 2% (20 MG/ML) 5 ML SYRINGE
INTRAMUSCULAR | Status: DC | PRN
  Administered 2023-09-30: 80 mg via INTRAVENOUS

## 2023-09-30 MED ORDER — DEXAMETHASONE SODIUM PHOSPHATE 10 MG/ML IJ SOLN
INTRAMUSCULAR | Status: AC
Start: 2023-09-30 — End: 2023-09-30
  Filled 2023-09-30: qty 1

## 2023-09-30 MED ORDER — ORAL CARE MOUTH RINSE
15.0000 mL | Freq: Once | OROMUCOSAL | Status: AC
Start: 2023-09-30 — End: 2023-09-30

## 2023-09-30 MED ORDER — OXYCODONE-ACETAMINOPHEN 5-325 MG PO TABS: 1.0000 | ORAL_TABLET | ORAL | 0 refills | Status: DC | PRN

## 2023-09-30 MED ORDER — FENTANYL CITRATE (PF) 100 MCG/2ML IJ SOLN
INTRAMUSCULAR | Status: DC | PRN
  Administered 2023-09-30 (×2): 50 ug via INTRAVENOUS

## 2023-09-30 MED ORDER — CIPROFLOXACIN IN D5W 400 MG/200ML IV SOLN
400.0000 mg | INTRAVENOUS | Status: AC
  Administered 2023-09-30: 400 mg via INTRAVENOUS
  Filled 2023-09-30: qty 200

## 2023-09-30 MED ORDER — GLYCOPYRROLATE 0.2 MG/ML IJ SOLN
INTRAMUSCULAR | Status: DC | PRN
  Administered 2023-09-30: .2 mg via INTRAVENOUS

## 2023-09-30 MED ORDER — OXYCODONE HCL 5 MG PO TABS
ORAL_TABLET | ORAL | Status: AC
  Filled 2023-09-30: qty 1

## 2023-09-30 MED ORDER — LACTATED RINGERS IV SOLN
INTRAVENOUS | Status: DC | PRN
Start: 2023-09-30 — End: 2023-09-30

## 2023-09-30 MED ORDER — DEXAMETHASONE SODIUM PHOSPHATE 10 MG/ML IJ SOLN
INTRAMUSCULAR | Status: DC | PRN
  Administered 2023-09-30: 5 mg via INTRAVENOUS

## 2023-09-30 MED ORDER — CHLORHEXIDINE GLUCONATE 0.12 % MT SOLN
15.0000 mL | Freq: Once | OROMUCOSAL | Status: AC
  Administered 2023-09-30: 15 mL via OROMUCOSAL

## 2023-09-30 MED ORDER — OXYCODONE HCL 5 MG/5ML PO SOLN: 5.0000 mg | Freq: Once | ORAL | Status: AC | PRN

## 2023-09-30 MED ORDER — LACTATED RINGERS IV SOLN: INTRAVENOUS | Status: DC

## 2023-09-30 MED ORDER — OXYCODONE HCL 5 MG PO TABS
5.0000 mg | ORAL_TABLET | Freq: Once | ORAL | Status: AC | PRN
  Administered 2023-09-30: 5 mg via ORAL

## 2023-09-30 MED ORDER — 0.9 % SODIUM CHLORIDE (POUR BTL) OPTIME
TOPICAL | Status: DC | PRN
  Administered 2023-09-30: 1000 mL

## 2023-09-30 MED ORDER — ONDANSETRON HCL 4 MG/2ML IJ SOLN: 4.0000 mg | Freq: Once | INTRAMUSCULAR | Status: DC | PRN

## 2023-09-30 SURGICAL SUPPLY — 23 items
BAG URO CATCHER STRL LF (MISCELLANEOUS) ×1 IMPLANT
BASKET ZERO TIP NITINOL 2.4FR (BASKET) IMPLANT
CATH URETL OPEN END 6FR 70 (CATHETERS) IMPLANT
CLOTH BEACON ORANGE TIMEOUT ST (SAFETY) ×1 IMPLANT
EXTRACTOR STONE NITINOL NGAGE (UROLOGICAL SUPPLIES) IMPLANT
FORCEPS BIOP 2.4F 115CM BACKLD (INSTRUMENTS) IMPLANT
GLOVE BIOGEL M 7.0 STRL (GLOVE) ×1 IMPLANT
GOWN STRL REUS W/ TWL XL LVL3 (GOWN DISPOSABLE) ×1 IMPLANT
GUIDEWIRE ANG ZIPWIRE 038X150 (WIRE) IMPLANT
GUIDEWIRE STR DUAL SENSOR (WIRE) ×2 IMPLANT
IV NS 1000ML BAXH (IV SOLUTION) ×1 IMPLANT
KIT TURNOVER KIT A (KITS) ×1 IMPLANT
MANIFOLD NEPTUNE II (INSTRUMENTS) ×1 IMPLANT
NS IRRIG 1000ML POUR BTL (IV SOLUTION) IMPLANT
PACK CYSTO (CUSTOM PROCEDURE TRAY) ×1 IMPLANT
SHEATH DILATOR SET 8/10 (MISCELLANEOUS) IMPLANT
SHEATH NAVIGATOR HD 11/13X28 (SHEATH) IMPLANT
SHEATH NAVIGATOR HD 11/13X36 (SHEATH) IMPLANT
SHEATH NAVIGATOR HD 12/14X46 (SHEATH) IMPLANT
STENT URET 6FRX26 CONTOUR (STENTS) IMPLANT
TRACTIP FLEXIVA PULS ID 200XHI (Laser) IMPLANT
TUBING CONNECTING 10 (TUBING) ×1 IMPLANT
TUBING UROLOGY SET (TUBING) ×1 IMPLANT

## 2023-09-30 NOTE — Discharge Instructions (Addendum)
[facility] °[phone] °Post Ureteroscopy With or Without Stent Instructions ° °Definitions: ° °Ureter: The duct that transports urine from the kidney to the bladder. °Stent:   A plastic hollow tube that is placed into the ureter, from the kidney to the                 bladder to prevent the ureter from swelling shut. ° °GENERAL INSTRUCTIONS: ° °Despite the fact that no skin incisions were used, the area around the ureter and bladder is raw and irritated. The stent is a foreign body which will further irritate the bladder wall. This irritation is manifested by increased frequency of urination, both day and night, and by an increase in the urge to urinate. In some, the urge to urinate is present almost always. Sometimes the urge is strong enough that you may not be able to stop yourself from urinating. The only real cure is to remove the stent and then give time for the bladder wall to heal which can't be done until the danger of the ureter swelling shut has passed, which varies. ° °You may see some blood in your urine while the stent is in place and a few days afterwards. Do not be alarmed, even if the urine was clear for a while. Get off your feet and drink lots of fluids until clearing occurs. If you start to pass clots or don't improve, call us. ° °DIET: °You may return to your normal diet immediately. Because of the raw surface of your bladder, alcohol, spicy foods, acid type foods and drinks with caffeine may cause irritation or frequency and should be used in moderation. To keep your urine flowing freely and to avoid constipation, drink plenty of fluids during the day ( 8-10 glasses ). °Tip: Avoid cranberry juice because it is very acidic. ° °ACTIVITY: °Your physical activity doesn't need to be restricted. However, if you are very active, you may see some blood in your urine. We suggest that you reduce your activity under these circumstances until the bleeding has stopped. ° °BOWELS: °It is  important to keep your bowels regular during the postoperative period. Straining with bowel movements can cause bleeding. A bowel movement every other day is reasonable. Use a mild laxative if needed, such as Milk of Magnesia 2-3 tablespoons, or 2 Dulcolax tablets. Call if you continue to have problems. If you have been taking narcotics for pain, before, during or after your surgery, you may be constipated. Take a laxative if necessary. ° ° °MEDICATION: °You should resume your pre-surgery medications unless told not to. In addition you will often be given an antibiotic to prevent infection. These should be taken as prescribed until the bottles are finished unless you are having an unusual reaction to one of the drugs. ° °PROBLEMS YOU SHOULD REPORT TO US: °· Fevers over 100.5 Fahrenheit. °· Heavy bleeding, or clots ( See above notes about blood in urine ). °· Inability to urinate. °· Drug reactions ( hives, rash, nausea, vomiting, diarrhea ). °· Severe burning or pain with urination that is not improving. ° °FOLLOW-UP: °You will need a follow-up appointment to monitor your progress. Call for this appointment at the number listed above. Usually the first appointment will be about three to fourteen days after your surgery. ° ° ° ° ° °

## 2023-09-30 NOTE — Transfer of Care (Signed)
 Immediate Anesthesia Transfer of Care Note  Patient: Cameron Wu  Procedure(s) Performed: Alfreida Inches, WITH STENT EXCHANGE (Right: Ureter) CYSTOSCOPY, WITH RETROGRADE PYELOGRAM, BIOPSY OF MASS (Right: Ureter)  Patient Location: PACU  Anesthesia Type:General  Level of Consciousness: awake and alert   Airway & Oxygen Therapy: Patient Spontanous Breathing and Patient connected to nasal cannula oxygen  Post-op Assessment: Report given to RN and Post -op Vital signs reviewed and stable  Post vital signs: Reviewed and stable  Last Vitals:  Vitals Value Taken Time  BP 142/91 09/30/23 16:08  Temp    Pulse 64 09/30/23 16:09  Resp 11 09/30/23 16:09  SpO2 98 % 09/30/23 16:09  Vitals shown include unfiled device data.  Last Pain:  Vitals:   09/30/23 1246  TempSrc: Oral  PainSc:          Complications: No notable events documented.

## 2023-09-30 NOTE — Op Note (Signed)
 Operative Note  Preoperative diagnosis:  1.  Right upper tract filling defect  Postoperative diagnosis: 1.  Right upper tract mass  Procedure(s): 1.  Cystoscopy 2. Right ureteroscopy with biopsy 3. Right retrograde pyelogram 4. Right ureteral stent exchange 5. Fluoroscopy with intraoperative interpretation  Surgeon: Doy Gene, MD  Assistants:  None  Anesthesia:  General  Complications:  None  EBL:  Minimal  Specimens: 1.  ID Type Source Tests Collected by Time Destination  1 : Right renal pelvis mass Tissue PATH GU biopsy SURGICAL PATHOLOGY Lahoma Pigg, MD 09/30/2023 1526     Drains/Catheters: 1.  Right 6Fr x 26cm ureteral stent without a tether string  Intraoperative findings:   Cystoscopy demonstrated no suspicious bladder lesions. Right retrograde pyelogram demonstrated large filling defect within the entirety of the right renal pelvis. Right ureteroscopy demonstrated large nodular mass in the renal pelvis.  Unable to bypassed mass to evaluate any further calyces.  Mass was sampled least 8 times with a basket. Successful stent exchange.  Indication:  Cameron Wu is a 73 y.o. male with a history of bladder cancer who underwent recent retrograde pyelogram at [facility] demonstrating large filling defect within the right renal collecting system.  He is being brought to the operating today for biopsy of presumed right upper tract renal mass.  Description of procedure: After informed consent was obtained from the patient, the patient was identified and taken to the operating room and placed in the supine position.  General anesthesia was administered as well as perioperative IV antibiotics.  At the beginning of the case, a time-out was performed to properly identify the patient, the surgery to be performed, and the surgical site.  Sequential compression devices were applied to the lower extremities at the beginning of the case for DVT prophylaxis.  The patient was then  placed in the dorsal lithotomy supine position, prepped and draped in sterile fashion.  Preliminary scout fluoroscopy revealed stent in adequate position. We then passed the 21-French rigid cystoscope through the urethra and into the bladder under vision without any difficulty , noting a normal urethra without strictures and a mildly obstructing prostate.  A systematic evaluation of the bladder revealed no evidence of any suspicious bladder lesions.  Ureteral orifices were in normal position.    The distal aspect of the ureteral stent was seen protruding from the right ureteral orifice.  We then used the alligator-tooth forceps and grasped the distal end of the ureteral stent and brought it out the urethral meatus while watching the proximal coil straighten out nicely on fluoroscopy. Through the ureteral stent, we then passed a 0.038 sensor wire up to the level of the renal pelvis.  The ureteral stent was then removed, leaving the sensor wire up the right ureter.    Under cystoscopic and flouroscopic guidance, we cannulated the right ureteral orifice with a 5-French open-ended ureteral catheter and a gentle retrograde pyelogram was performed revealing abrupt filling defect within the renal pelvis.  No contrast filled the calyces.  There was mild right hydroureter. A 0.038 sensor wire was then passed up to the level of the renal pelvis and secured to the drape as a safety wire. The ureteral catheter and cystoscope were removed, leaving the safety wire in place.   A semi-rigid ureteroscope was passed alongside the wire up the distal ureter which appeared normal.  I was able to advance this semirigid ureteroscope all the way into the collecting system.  There, encountered clot with large nodular mass  present.  Using a 0 tip basket, this mass was biopsied about 8 times.  These were sent to pathology for evaluation.  Following this, there is excellent hemostasis with no significant bleeding.  I removed the scope  noting no trauma to the ureter.   Once the ureteroscope was removed, the Glidewire was backloaded through the rigid cystoscope, which was then advanced down the urethra and into the bladder. We then used the Glidewire under direct vision through the rigid cystoscope and under fluoroscopic guidance and passed up a 6-French, 26 cm double-pigtail ureteral stent up ureter, making sure that the proximal and distal ends coiled within the kidney and bladder respectively.  The cystoscope was then advanced back into the bladder under vision.  We were able to see the distal stent coiling nicely within the bladder.  The bladder was then emptied with irrigation solution.  The cystoscope was then removed.    The patient tolerated the procedure well and there was no complication. Patient was awoken from anesthesia and taken to the recovery room in stable condition. I was present and scrubbed for the entirety of the case.  Plan:  Patient will be discharged home.  Follow up with me in the office to review pathology.  He will also obtain imaging.  Discussed operative findings with his wife.   Matt R. Bronda Alfred MD [facility]  Pager: [phone]

## 2023-09-30 NOTE — Anesthesia Postprocedure Evaluation (Signed)
 Anesthesia Post Note  Patient: Cameron Wu  Procedure(s) Performed: Alfreida Inches, WITH STENT EXCHANGE (Right: Ureter) CYSTOSCOPY, WITH RETROGRADE PYELOGRAM, BIOPSY OF MASS (Right: Ureter)     Patient location during evaluation: PACU Anesthesia Type: General Level of consciousness: awake and alert and oriented Pain management: pain level controlled Vital Signs Assessment: post-procedure vital signs reviewed and stable Respiratory status: spontaneous breathing, nonlabored ventilation and respiratory function stable Cardiovascular status: blood pressure returned to baseline and stable Postop Assessment: no apparent nausea or vomiting Anesthetic complications: no   No notable events documented.  Last Vitals:  Vitals:   09/30/23 1645 09/30/23 1653  BP: (!) 167/79 (!) 182/82  Pulse: (!) 54 (!) 55  Resp: 16 16  Temp:    SpO2: 92% 99%    Last Pain:  Vitals:   09/30/23 1702  TempSrc:   PainSc: 5                  Iya Hamed A.

## 2023-09-30 NOTE — Anesthesia Preprocedure Evaluation (Addendum)
 Anesthesia Evaluation  Patient identified by MRN, date of birth, ID band Patient awake    Reviewed: Allergy & Precautions, NPO status , Patient's Chart, lab work & pertinent test results  History of Anesthesia Complications (+) history of anesthetic complications  Airway Mallampati: II  TM Distance: >3 FB     Dental no notable dental hx. (+) Teeth Intact, Caps, Dental Advisory Given   Pulmonary sleep apnea and Continuous Positive Airway Pressure Ventilation , former smoker   Pulmonary exam normal breath sounds clear to auscultation       Cardiovascular hypertension, Normal cardiovascular exam Rhythm:Regular Rate:Normal     Neuro/Psych  Neuromuscular disease  negative psych ROS   GI/Hepatic Neg liver ROS,GERD  Medicated,,  Endo/Other    Renal/GU Renal InsufficiencyRenal diseaseRight renal pelvis mass   Hx/o Bladder Ca negative genitourinary   Musculoskeletal  (+) Arthritis , Osteoarthritis,    Abdominal   Peds  Hematology  (+) Blood dyscrasia, anemia Thrombocytopenia- mild Plt 132k   Anesthesia Other Findings   Reproductive/Obstetrics                             Anesthesia Physical Anesthesia Plan  ASA: 2  Anesthesia Plan: General   Post-op Pain Management: Minimal or no pain anticipated   Induction: Intravenous  PONV Risk Score and Plan: 3 and Treatment may vary due to age or medical condition, Ondansetron  and Propofol  infusion  Airway Management Planned: LMA  Additional Equipment: None  Intra-op Plan:   Post-operative Plan: Extubation in OR  Informed Consent: I have reviewed the patients History and Physical, chart, labs and discussed the procedure including the risks, benefits and alternatives for the proposed anesthesia with the patient or authorized representative who has indicated his/her understanding and acceptance.     Dental advisory given  Plan Discussed with:  Anesthesiologist and CRNA  Anesthesia Plan Comments:         Anesthesia Quick Evaluation

## 2023-09-30 NOTE — Anesthesia Procedure Notes (Signed)
 Procedure Name: LMA Insertion Date/Time: 09/30/2023 3:12 PM  Performed by: Dean Goldner, CRNAPre-anesthesia Checklist: Patient identified, Emergency Drugs available, Suction available and Patient being monitored Patient Re-evaluated:Patient Re-evaluated prior to induction Oxygen Delivery Method: Circle system utilized Preoxygenation: Pre-oxygenation with 100% oxygen Induction Type: IV induction Ventilation: Mask ventilation without difficulty LMA: LMA with gastric port inserted LMA Size: 4.0 Tube type: Oral Number of attempts: 1 Airway Equipment and Method: Stylet and Oral airway Placement Confirmation: positive ETCO2 and breath sounds checked- equal and bilateral Tube secured with: Tape Dental Injury: Teeth and Oropharynx as per pre-operative assessment

## 2023-09-30 NOTE — H&P (Signed)
 Visit Report     09/22/2023   --------------------------------------------------------------------------------   Cameron Wu  MRN: [id number]  DOB: [birth year], 73 year old Male  SSN: -[id number]   PRIMARY CARE:  Dolly Fret. Sheralyn Dies, MD  PRIMARY CARE FAX:  [phone]  REFERRING:  Dolly Fret. Sheralyn Dies, MD  PROVIDER:  Doy Gene, M.D.  LOCATION:  [facility] [phone]     --------------------------------------------------------------------------------   CC/HPI: Cameron Wu is a 73 year old male who is seen in follow with history of high risk nonmuscle base of bladder cancer as well as right renal pelvis filling defect.   1. High risk nonmuscle based bladder cancer:  - Dx 02/2021 with 6.5 cm posterior wall lesion with TURBT consistent with HG Ta UCB. Gemcitabine  was not given.  - Repeat TURBT 03/2021 with 8 mm left anterior wall papillary lesion. Gemcitabine  administered. Path with HG Ta UCB.  -S/p 6/6 BCG induction completed 05/2021  -Bx 10/2021 of papillary lesions in bladder dome with benign submucosa and chronic inflammation.  -3.18.2024: Cysto, bladder biopsies, (B) RGPs. RGPs nml, path--Benign urothelium and submucosa with chronic inflammation and reactive changes   5.9.2024: Completed maintenance BCG   5.13.2025: Referred to [facility], Dr. Allen Archer. Underwent cystoscopy, bilateral retrograde pyelograms, bilateral renal washings, biopsy of prostatic urethra, bladder biopsy. Bluelight cystoscopy was utilized. There was no obvious fluorescein abnormality in the prostatic urethra or the bladder. Retrograde study of the left upper tract was normal. Retrograde study of the right upper tract revealed filling defects in the right renal pelvis. The cytologies of renal washings were benign bilaterally. Biopsies of bladder and a prostatic urethra were were benign.   Patient did not enjoy his experience with [facility] and is thus seen at [facility] urology to schedule repeat  right ureteroscopy with possible biopsy.     ALLERGIES: Penicillins    MEDICATIONS: Tamsulosin  HCl 0.4 MG Capsule  cloNIDine  Donepezil  HCl  Finasteride  5 MG Tablet 1 tablet PO Daily  Ginkoba  Loratadine 10 MG Oral Tablet Oral  Pantoprazole Sodium  Rosuvastatin  Calcium      GU PSH: Bladder Instill AntiCA Agent - 03/26/2021, 03/26/2021 Cystoscopy TURBT <2 cm - 10/29/2021, 03/26/2021 Cystoscopy TURBT >5 cm - 2022       PSH Notes: Colonoscopy (Fiberoptic), Colonoscopy (Fiberoptic)   NON-GU PSH: Diagnostic Colonoscopy - 2013, 2013     GU PMH: BPH w/LUTS (Stable), His sx's remain stable and he is satisfied with the quality of his urination. He has been considering Urolift but will follow-up with this at a later date. - 2020, (Stable), mild/moderate BPH symptoms on maximal medical therapy. He would eventually like to come off of his medications, - 2019 (Stable), He is doing well with maximal medical therapy. He questions whether he can try backing off on one of the tamsulosin  capsules a day. I think that is fine., - 2018, - 2017 Elevated PSA - 2020, (Stable), Stable exam, stable PSA, - 2019 (Stable), PSA has been stable/normal, even when corrected for finasteride . Digital rectal exam today is normal., - 2018, - 2017, Elevated prostate specific antigen (PSA), - 2016 Nocturia (Stable) - 2018 BPH w/o LUTS, Benign prostatic hypertrophy without lower urinary tract symptoms - 2016 ED due to arterial insufficiency, Erectile dysfunction due to arterial insufficiency - 2014      PMH Notes:  2012-03-14 11:38:49 - Note: Chronic Reflux Esophagitis   NON-GU PMH: Encounter for general adult medical examination without abnormal findings, Encounter for preventive health examination    FAMILY  HISTORY: Breast Cancer - Runs In Family Chronic Obstructive Pulmonary Disease - Runs In Family Death In The Family Father - Runs In Family Death In The Family Mother - Runs In Family Family Health Status Number  - Runs In Family Hepatic Disorders - Runs In Family Transient Ischemic Attack - Runs In Family   SOCIAL HISTORY: No Social History     Notes: Former smoker, Alcohol Use, Occupation: Retired, Caffeine Use, Marital History - Currently Married   REVIEW OF SYSTEMS:    GU Review Male:   Patient reports frequent urination, hard to postpone urination, burning/ pain with urination, leakage of urine, and get up at night to urinate. Patient denies have to strain to urinate , stream starts and stops, penile pain, erection problems, and trouble starting your stream.  Gastrointestinal (Upper):   Patient denies nausea, vomiting, and indigestion/ heartburn.  Gastrointestinal (Lower):   Patient denies diarrhea and constipation.  Constitutional:   Patient denies fever, night sweats, weight loss, and fatigue.  Skin:   Patient denies skin rash/ lesion and itching.  Eyes:   Patient denies blurred vision and double vision.  Ears/ Nose/ Throat:   Patient denies sore throat and sinus problems.  Hematologic/Lymphatic:   Patient denies swollen glands and easy bruising.  Cardiovascular:   Patient denies leg swelling and chest pains.  Respiratory:   Patient denies cough and shortness of breath.  Endocrine:   Patient denies excessive thirst.  Musculoskeletal:   Patient denies back pain and joint pain.  Neurological:   Patient denies headaches and dizziness.  Psychologic:   Patient denies depression and anxiety.   VITAL SIGNS:      09/22/2023 09:52 AM  Weight 177 lb / 80.29 kg  Height 70 in / 177.8 cm  BP 149/83 mmHg  Pulse 63 /min  Temperature 98.3 F / 36.8 C  BMI 25.4 kg/m   MULTI-SYSTEM PHYSICAL EXAMINATION:    Constitutional: Well-nourished. No physical deformities. Normally developed. Good grooming.  Respiratory: No labored breathing, no use of accessory muscles.   Cardiovascular: Normal temperature, normal extremity pulses, no swelling, no varicosities.  Gastrointestinal: No mass, no tenderness, no  rigidity, non obese abdomen.     Complexity of Data:  Source Of History:  Patient, Medical Record Summary  Records Review:   Pathology Reports, Previous Doctor Records, Previous Hospital Records, Previous Patient Records  Urine Test Review:   Urinalysis   11/14/18 11/01/17 11/02/16 10/28/15 09/18/14 09/12/13 09/13/12 03/14/12  PSA  Total PSA 1.5 ng/dl 1.6 ng/dl 1.9 ng/dl 4.09  8.11  9.14  7.82  2.71     PROCEDURES:         PVR Ultrasound - 95621  Scanned Volume: 180 cc         Visit Complexity - G2211          Urinalysis w/Scope Dipstick Dipstick Cont'd Micro  Color: Yellow Bilirubin: Neg mg/dL WBC/hpf: 0 - 5/hpf  Appearance: Clear Ketones: Neg mg/dL RBC/hpf: 0 - 2/hpf  Specific Gravity: <=1.005 Blood: 3+ ery/uL Bacteria: Rare (0-9/hpf)  pH: 5.5 Protein: Neg mg/dL Cystals: NS (Not Seen)  Glucose: Neg mg/dL Urobilinogen: 0.2 mg/dL Casts: NS (Not Seen)    Nitrites: Neg Trichomonas: Not Present    Leukocyte Esterase: Trace leu/uL Mucous: Not Present      Epithelial Cells: NS (Not Seen)      Yeast: NS (Not Seen)      Sperm: Not Present    ASSESSMENT:      ICD-10 Details  1 GU:  History of bladder cancer - Z85.51   2   Benign tumor right kidney - D30.01    PLAN:            Medications Stop Meds: Activated Charcoal 1 PO Daily  Discontinue: 09/22/2023  - Reason: The medication cycle was completed.  Aspirin  81 MG TABS Oral  Start: 03/14/2012  Discontinue: 09/22/2023  - Reason: The medication cycle was completed.  DiphenhydrAMINE  HCl TABS Oral  Start: 03/14/2012  Discontinue: 09/22/2023  - Reason: The medication cycle was completed.  Fish Oil 1 PO Daily  Discontinue: 09/22/2023  - Reason: The medication cycle was completed.  Ibuprofen 200 MG Oral Capsule Oral  Start: 03/14/2012  Discontinue: 09/22/2023  - Reason: The medication cycle was completed.  Loperamide HCl - 2 MG Oral Capsule Oral  Start: 03/14/2012  Discontinue: 09/22/2023  - Reason: The medication cycle was  completed.  Magnesium Oxide -Mg Supplement 400 (240 Mg) MG Tablet 1 tablet PO Daily  Discontinue: 09/22/2023  - Reason: The medication cycle was completed.  Melatonin 1 tablet PO Q HS  Discontinue: 09/22/2023  - Reason: The medication cycle was completed.  Metamucil 1 PO Daily  Discontinue: 09/22/2023  - Reason: The medication cycle was completed.            Orders X-Rays: C.T. Hematuria With and Without I.V. Contrast. No Oral Contrast    C.T. Chest Without I.V. Contrast          Schedule Labs: 1 Week - BMP          Document Letter(s):  Created for Patient: Clinical Summary         Notes:    1. High risk nonmuscle based bladder cancer:  - Completed maintenance BCG as above. Most recent cystoscopy unremarkable.   #2. Right renal pelvis filling defect: Recommend proceeding with CT hematuria protocol. Ordered. Will arrange for next available right ureteroscopy with biopsy. Surgery letter has been submitted in May/25. Discussed risk benefits including risk of infection, bleeding, discomfort.   CC: Trent Frizzle, MD    Urology Preoperative H&P   Chief Complaint: Right renal filling defect  History of Present Illness: LIBORIO SACCENTE is a 72 y.o. male with right renal filling defect here for cystoscopy, right ureteroscopy and biopsy. Denies fevers, chills, dysuria.    Past Medical History:  Diagnosis Date   Abnormal liver function tests 05/03/2011   Arthritis    Bladder cancer (HCC) 02/2021   high grade nonmuscle invasive bladder cancer   BPH (benign prostatic hypertrophy) 05/03/2011   Chronic kidney disease    Complication of anesthesia    coughs alot and has a very dry, scratchy throat per patient   Elevated transaminase level    GERD (gastroesophageal reflux disease)    Hairy cell leukemia, in remission (HCC) 1992   remission since 2008   History of kidney stones    Hypertension    IBS (irritable bowel syndrome)    Irritable bowel syndrome    2 yrs ago,  states he had inflammation around anus. Biopsy: inclusive   Lumbar foraminal stenosis 12/31/2019   Lumbar radiculopathy 11/15/2019   Sleep apnea     Past Surgical History:  Procedure Laterality Date   BACK SURGERY     BONE MARROW BIOPSY     2007 and 1992   CATARACT EXTRACTION W/PHACO Right 08/15/2020   Procedure: CATARACT EXTRACTION PHACO AND INTRAOCULAR LENS PLACEMENT RIGHT EYE;  Surgeon: Tarri Farm, MD;  Location: [facility];  Service: Ophthalmology;  Laterality: Right;  right CDE=7.75   COLONOSCOPY  04/19/2006   with polyps   COLONOSCOPY  09/30/2011   Procedure: COLONOSCOPY;  Surgeon: Ruby Corporal, MD;  Location: [facility];  Service: Endoscopy;  Laterality: N/A;  730   COLONOSCOPY N/A 01/11/2019   Procedure: COLONOSCOPY;  Surgeon: Ruby Corporal, MD;  Location: [facility];  Service: Endoscopy;  Laterality: N/A;  100-office move to 9:30am   CYSTOSCOPY W/ RETROGRADES Bilateral 02/23/2021   Procedure: CYSTOSCOPY WITH RETROGRADE PYELOGRAM;  Surgeon: Trent Frizzle, MD;  Location[facility];  Service: Urology;  Laterality: Bilateral;   CYSTOSCOPY W/ RETROGRADES Bilateral 07/05/2022   Procedure: CYSTOSCOPY WITH RETROGRADE PYELOGRAM;  Surgeon: Trent Frizzle, MD;  Location: [facility];  Service: Urology;  Laterality: Bilateral;   CYSTOSCOPY WITH BIOPSY N/A 07/05/2022   Procedure: CYSTOSCOPY WITH BIOPSY;  Surgeon: Trent Frizzle, MD;  Location: [facility];  Service: Urology;  Laterality: N/A;  30 MINS   HX COLON POLYPS     TONSILLECTOMY     childhood   TOTAL HIP ARTHROPLASTY Right 12/19/2022   Procedure: RIGHT POSTERIOR TOTAL HIP ARTHROPLASTY;  Surgeon: Murleen Arms, MD;  Location: [facility];  Service: Orthopedics;  Laterality: Right;   TRANSFORAMINAL LUMBAR INTERBODY FUSION W/ MIS 1 LEVEL Right 11/18/2020   Procedure: Right Lumbar Five-Sacral One Minimally invasive transforaminal lumbar interbody fusion;  Surgeon:  Cannon Champion, MD;  Location: [facility];  Service: Neurosurgery;  Laterality: Right;   TRANSURETHRAL RESECTION OF BLADDER TUMOR N/A 03/26/2021   Procedure: REPEAT TRANSURETHRAL RESECTION OF BLADDER TUMOR (TURBT)/ POST OPERATIVE INSTILLATION OF GEMCITABINE ;  Surgeon: Trent Frizzle, MD;  Location: [facility];  Service: Urology;  Laterality: N/A;   TRANSURETHRAL RESECTION OF BLADDER TUMOR WITH MITOMYCIN -C N/A 02/23/2021   Procedure: TRANSURETHRAL RESECTION OF BLADDER TUMOR;  Surgeon: Trent Frizzle, MD;  Location: [facility];  Service: Urology;  Laterality: N/A;   TRANSURETHRAL RESECTION OF BLADDER TUMOR WITH MITOMYCIN -C N/A 10/29/2021   Procedure: TRANSURETHRAL RESECTION OF BLADDER TUMOR WITH GEMCITABINE ;  Surgeon: Trent Frizzle, MD;  Location: [facility];  Service: Urology;  Laterality: N/A;    Allergies:  Allergies  Allergen Reactions   Penicillins Swelling and Other (See Comments)   Pollen Extract     History reviewed. No pertinent family history.  Social History:  reports that he quit smoking about 17 years ago. His smoking use included cigarettes. He has never used smokeless tobacco. He reports current alcohol use of about 4.0 standard drinks of alcohol per week. He reports that he does not use drugs.  ROS: A complete review of systems was performed.  All systems are negative except for pertinent findings as noted.  Physical Exam:  Vital signs in last 24 hours: Weight:  [78.5 kg] 78.5 kg (06/13 1240) Constitutional:  Alert and oriented, No acute distress Cardiovascular: Regular rate and rhythm Respiratory: Normal respiratory effort, Lungs clear bilaterally GI: Abdomen is soft, nontender, nondistended, no abdominal masses GU: No CVA tenderness Lymphatic: No lymphadenopathy Neurologic: Grossly intact, no focal deficits Psychiatric: Normal mood and affect  Laboratory Data:  Recent Labs    09/29/23 1043  WBC 5.3   HGB 11.3*  HCT 35.7*  PLT 132*    Recent Labs    09/29/23 1043  NA 141  K 3.7  CL 105  GLUCOSE 93  BUN 16  CALCIUM  9.2  CREATININE 1.52*     No results found for this or any previous  visit (from the past 24 hours). No results found for this or any previous visit (from the past 240 hours).  Renal Function: Recent Labs    09/29/23 1043  CREATININE 1.52*   Estimated Creatinine Clearance: 48.2 mL/min (A) (by C-G formula based on SCr of 1.52 mg/dL (H)).  Radiologic Imaging: No results found.  I independently reviewed the above imaging studies.  Assessment and Plan COLLEN VINCENT is a 73 y.o. male with right renal filling defect here for cystoscopy, right ureteroscopy and biopsy.    -The risks, benefits and alternatives of cystoscopy with  JJ stent placement was discussed with the patient.  Risks include, but are not limited to: bleeding, urinary tract infection, ureteral injury, ureteral stricture disease, chronic pain, urinary symptoms, bladder injury, stent migration, the need for nephrostomy tube placement, MI, CVA, DVT, PE and the inherent risks with general anesthesia.  The patient voices understanding and wishes to proceed.   Matt R. Arbor Leer MD 09/30/2023, 12:41 PM  [facility] Pager: [phone]): [phone]

## 2023-10-01 ENCOUNTER — Encounter (HOSPITAL_COMMUNITY): Payer: Self-pay | Admitting: Urology

## 2023-10-04 LAB — SURGICAL PATHOLOGY

## 2023-10-05 ENCOUNTER — Telehealth: Payer: Self-pay

## 2023-10-05 NOTE — Telephone Encounter (Signed)
-----   Message from Lauretta Ponto sent at 10/05/2023  3:51 PM EDT ----- Cameron Wu never seen this patient before. He is followed by Dr. Joie Narrow here.   He just had surgery by Dr. Freddi Jaeger at [facility] on 09/30/2023 so I expect that he will be followed there for his postop visit (??) - please contact patient to confirm. Doubt that he needs the appt which is scheduled with me on 10/11/2023.   Patient should be seen by Dr. Joie Narrow for next visit if returning here after done with care at [facility]. ----- Message ----- From: Audra Blend, CMA Sent: 09/23/2023   2:15 PM EDT To: Lauretta Ponto, FNP  Please review

## 2023-10-05 NOTE — Telephone Encounter (Signed)
 Patient made aware and voiced understanding. Patient put on wait list for appointment.

## 2023-10-11 ENCOUNTER — Ambulatory Visit: Admitting: Urology

## 2023-10-23 NOTE — Progress Notes (Unsigned)
 Cameron Wu CONSULT NOTE  Patient Care Team: [name], MD as PCP - General (Internal Medicine)  ASSESSMENT & PLAN:  Cameron Wu is a 73 y.o.male with history of NMIBC, Hairy cell leukemia, BPH, IBS being [facility] UC of renal pelvis. Referred by Dr. Donnice Siad.  Pathology reviewed from 6/13. RIGHT RENAL PELVIS MASS, BIOPSY:  Detached small fragment of high grade urothelial carcinoma.  CrCl was 48. Borderline renal function.   Patient has significant memory loss, hearing loss.  Discussed the diagnosis today. Given the pathology, recommend CT chest for staging.    Given her memory loss, hearing loss and borderline renal function, and evidence of adjuvant therapy with POUT trial, and nivolumab, recommend nephroureterectomy and follow up with pathology for final staging evaluation if need adjuvant therapy based on POUT trial vs nivolumab can be consdier.  Will communicate with Urology on his imaging and further recommendations. Assessment & Plan Cancer of right renal pelvis (HCC) Follow up CT scan Recommend nephroureterectomy Follow up with final pathology Acute anemia B12, MMA, folate and ferritin  Orders Placed This Encounter  Procedures   CBC with Differential (Cancer Wu Only)    Standing Status:   Future    Expiration Date:   10/23/2024   Vitamin B12    Standing Status:   Future    Expiration Date:   10/23/2024   Ferritin    Standing Status:   Future    Expiration Date:   10/23/2024   Folate    Standing Status:   Future    Expiration Date:   10/23/2024   Methylmalonic acid, serum    Standing Status:   Future    Expiration Date:   11/24/2023   All questions were answered. The patient knows to call the clinic with any problems, questions or concerns. No barriers to learning was detected.  Cameron JAYSON Chihuahua, MD 7/7/20251:54 PM  CHIEF COMPLAINTS/PURPOSE OF CONSULTATION:  UTUC  HISTORY OF PRESENTING ILLNESS:  Cameron Wu 73  y.o. male is here because of UTUC.  I have reviewed his chart and materials related to his cancer extensively and collaborated history with the patient.  Patient has h/o NMIBC and had induction BCG. Report since his treatment he has not felt well with fatigue and tiredness.  He underwent cystoscopy, right ureteroscopy with biopsy and found HG UC from R renal pelvis. No UC from the bladder. He was referred here for consideration of neoadjuvant therapy.  At baseline he has memory loss. He had several rounds of chemotherapy for HCL. He has bilateral hearing loss worse on the right.    MEDICAL HISTORY:  Past Medical History:  Diagnosis Date   Abnormal liver function tests 05/03/2011   Arthritis    Bladder cancer (HCC) 02/2021   high grade nonmuscle invasive bladder cancer   BPH (benign prostatic hypertrophy) 05/03/2011   Chronic kidney disease    Complication of anesthesia    coughs alot and has a very dry, scratchy throat per patient   Elevated transaminase level    GERD (gastroesophageal reflux disease)    Hairy cell leukemia, in remission (HCC) 1992   remission since 2008   History of kidney stones    Hypertension    IBS (irritable bowel syndrome)    Irritable bowel syndrome    2 yrs ago, states he had inflammation around anus. Biopsy: inclusive   Lumbar foraminal stenosis 12/31/2019   Lumbar radiculopathy 11/15/2019   Sleep apnea  SURGICAL HISTORY: Past Surgical History:  Procedure Laterality Date   BACK SURGERY     BONE MARROW BIOPSY     2007 and 1992   CATARACT EXTRACTION W/PHACO Right 08/15/2020   Procedure: CATARACT EXTRACTION PHACO AND INTRAOCULAR LENS PLACEMENT RIGHT EYE;  Surgeon: Harrie Agent, MD;  Location: [facility];  Service: Ophthalmology;  Laterality: Right;  right CDE=7.75   COLONOSCOPY  04/19/2006   with polyps   COLONOSCOPY  09/30/2011   Procedure: COLONOSCOPY;  Surgeon: Claudis Cameron Rivet, MD;  Location: [facility];  Service: Endoscopy;  Laterality:  N/A;  730   COLONOSCOPY N/A 01/11/2019   Procedure: COLONOSCOPY;  Surgeon: Rivet Claudis RAYMOND, MD;  Location: [facility];  Service: Endoscopy;  Laterality: N/A;  100-office move to 9:30am   CYSTOSCOPY W/ RETROGRADES Bilateral 02/23/2021   Procedure: CYSTOSCOPY WITH RETROGRADE PYELOGRAM;  Surgeon: Matilda Senior, MD;  Location: [facility];  Service: Urology;  Laterality: Bilateral;   CYSTOSCOPY W/ RETROGRADES Bilateral 07/05/2022   Procedure: CYSTOSCOPY WITH RETROGRADE PYELOGRAM;  Surgeon: Matilda Senior, MD;  Location: [facility];  Service: Urology;  Laterality: Bilateral;   CYSTOSCOPY W/ RETROGRADES Right 09/30/2023   Procedure: CYSTOSCOPY, WITH RETROGRADE PYELOGRAM, BIOPSY OF MASS;  Surgeon: Selma Donnice SAUNDERS, MD;  Location: [facility];  Service: Urology;  Laterality: Right;   CYSTOSCOPY WITH BIOPSY N/A 07/05/2022   Procedure: CYSTOSCOPY WITH BIOPSY;  Surgeon: Matilda Senior, MD;  Location: [facility];  Service: Urology;  Laterality: N/A;  30 MINS   CYSTOSCOPY WITH URETEROSCOPY AND STENT PLACEMENT Right 09/30/2023   Procedure: ERNESTA, WITH STENT EXCHANGE;  Surgeon: Selma Donnice SAUNDERS, MD;  Location: [facility];  Service: Urology;  Laterality: Right;   HX COLON POLYPS     TONSILLECTOMY     childhood   TOTAL HIP ARTHROPLASTY Right 12/19/2022   Procedure: RIGHT POSTERIOR TOTAL HIP ARTHROPLASTY;  Surgeon: Edna Toribio LABOR, MD;  Location: [facility];  Service: Orthopedics;  Laterality: Right;   TRANSFORAMINAL LUMBAR INTERBODY FUSION W/ MIS 1 LEVEL Right 11/18/2020   Procedure: Right Lumbar Five-Sacral One Minimally invasive transforaminal lumbar interbody fusion;  Surgeon: Cheryle Debby LABOR, MD;  Location: [facility];  Service: Neurosurgery;  Laterality: Right;   TRANSURETHRAL RESECTION OF BLADDER TUMOR N/A 03/26/2021   Procedure: REPEAT TRANSURETHRAL RESECTION OF BLADDER TUMOR (TURBT)/ POST OPERATIVE INSTILLATION OF GEMCITABINE ;  Surgeon: Matilda Senior, MD;  Location: [facility];  Service: Urology;  Laterality: N/A;   TRANSURETHRAL RESECTION OF BLADDER TUMOR WITH MITOMYCIN -C N/A 02/23/2021   Procedure: TRANSURETHRAL RESECTION OF BLADDER TUMOR;  Surgeon: Matilda Senior, MD;  Location: [facility];  Service: Urology;  Laterality: N/A;   TRANSURETHRAL RESECTION OF BLADDER TUMOR WITH MITOMYCIN -C N/A 10/29/2021   Procedure: TRANSURETHRAL RESECTION OF BLADDER TUMOR WITH GEMCITABINE ;  Surgeon: Matilda Senior, MD;  Location: [facility];  Service: Urology;  Laterality: N/A;    SOCIAL HISTORY: Social History   Socioeconomic History   Marital status: Married    Spouse name: Not on file   Number of children: Not on file   Years of education: Not on file   Highest education level: Not on file  Occupational History   Not on file  Tobacco Use   Smoking status: Former    Current packs/day: 0.00    Types: Cigarettes    Quit date: 12/18/2005    Years since quitting: 17.8   Smokeless tobacco: Never  Vaping Use   Vaping status: Never Used  Substance and Sexual Activity   Alcohol use: Yes    Alcohol/week: 4.0 standard drinks of alcohol    Types: 4 Cans of beer per week    Comment: social   Drug use: No   Sexual activity: Not on file  Other Topics Concern   Not on file  Social History Narrative   Not on file   Social Drivers of Health   Financial Resource Strain: Not on file  Food Insecurity: No Food Insecurity (10/24/2023)   Hunger Vital Sign    Worried About Running Out of Food in the Last Year: Never true    Ran Out of Food in the Last Year: Never true  Transportation Needs: No Transportation Needs (10/24/2023)   PRAPARE - Administrator, Civil Service (Medical): No    Lack of Transportation (Non-Medical): No  Physical Activity: Not on file  Stress: Not on file  Social Connections: Not on file  Intimate Partner Violence: Not At Risk (10/24/2023)   Humiliation,  Afraid, Rape, and Kick questionnaire    Fear of Current or Ex-Partner: No    Emotionally Abused: No    Physically Abused: No    Sexually Abused: No    FAMILY HISTORY: No family history on file.  ALLERGIES:  is allergic to penicillins and pollen extract.  MEDICATIONS:  Current Outpatient Medications  Medication Sig Dispense Refill   acetaminophen  (TYLENOL ) 500 MG tablet Take 1,000 mg by mouth every 8 (eight) hours as needed for moderate pain (pain score 4-6).     Calcium  Carb-Cholecalciferol (CALCIUM  + VITAMIN D3 PO) Take 1 tablet by mouth daily with lunch.     CHARCOAL ACTIVATED PO Take 2 tablets by mouth daily as needed (upset stomach). 520 mg     clonazePAM  (KLONOPIN ) 0.5 MG tablet Take 0.5 mg by mouth daily as needed for anxiety.     docusate sodium  (COLACE) 100 MG capsule Take 1 capsule (100 mg total) by mouth daily as needed for up to 30 doses. 30 capsule 0   donepezil  (ARICEPT ) 10 MG tablet Take 10 mg by mouth daily.     doxylamine , Sleep, (UNISOM ) 25 MG tablet Take 25 mg by mouth at bedtime as needed for sleep.     finasteride  (PROSCAR ) 5 MG tablet Take 1 tablet (5 mg total) by mouth every other day. 90 tablet 3   Ginkgo Biloba (GINKOBA PO) Take 120 mg by mouth daily.     Homeopathic Products (LEG CRAMPS PM SL) Place 2 tablets under the tongue daily as needed (leg cramps).     levocetirizine (XYZAL) 5 MG tablet Take 5 mg by mouth at bedtime as needed for allergies.     loperamide (IMODIUM) 2 MG capsule Take 2 mg by mouth as needed for diarrhea or loose stools.     Menthol , Topical Analgesic, (BIOFREEZE COOL THE PAIN) 4 % GEL Apply 1 Application topically as needed (pain).     Multiple Vitamins-Minerals (ONE-A-DAY MENS 50+) TABS Take 1 tablet by mouth daily.     Naphazoline-Pheniramine (OPCON-A) 0.027-0.315 % SOLN Place 1 drop into both eyes as needed (eye irritation).     pantoprazole (PROTONIX) 40 MG tablet Take 40 mg by mouth every morning.     rosuvastatin  (CRESTOR ) 10 MG  tablet Take 10 mg by mouth at bedtime.     Saline (ARY NASAL MIST ALLERGY/SINUS) 2.65 % SOLN Place 1 spray into the nose as needed (congestion).     simethicone  (MYLICON) 125 MG chewable tablet Chew 125  mg by mouth 2 (two) times daily as needed for flatulence.     tamsulosin  (FLOMAX ) 0.4 MG CAPS capsule Take 1 capsule (0.4 mg total) by mouth every other day. 90 capsule 3   No current facility-administered medications for this visit.   Facility-Administered Medications Ordered in Other Visits  Medication Dose Route Frequency Provider Last Rate Last Admin   gemcitabine  (GEMZAR ) chemo syringe for bladder instillation 2,000 mg  2,000 mg Bladder Instillation Once Dahlstedt, Stephen, MD        REVIEW OF SYSTEMS:   All relevant systems were reviewed with the patient and are negative.  PHYSICAL EXAMINATION: ECOG PERFORMANCE STATUS: 1 - Symptomatic but completely ambulatory  Vitals:   10/24/23 1300  BP: (!) 140/79  Pulse: 61  Resp: 20  Temp: (!) 97.5 F (36.4 C)  SpO2: 98%   Filed Weights   10/24/23 1300  Weight: 169 lb 14.4 oz (77.1 kg)    GENERAL: alert, no distress and comfortable SKIN: skin color is normal, no jaundice EYES: sclera clear LYMPH:  no palpable lymphadenopathy in the cervical, axillary regions LUNGS: Effort normal, no respiratory distress.  Clear to auscultation bilaterally HEART: regular rate & rhythm and no lower extremity edema ABDOMEN: soft, non-tender and nondistended  LABORATORY DATA:  I have reviewed the data as listed Lab Results  Component Value Date   WBC 5.3 09/29/2023   HGB 11.3 (L) 09/29/2023   HCT 35.7 (L) 09/29/2023   MCV 101.4 (H) 09/29/2023   PLT 132 (L) 09/29/2023   Recent Labs    12/20/22 0344 08/24/23 1129 09/29/23 1043  NA 134* 141 141  K 4.0 3.7 3.7  CL 102 103 105  CO2 25 31 28   GLUCOSE 156* 97 93  BUN 16 17 16   CREATININE 0.93 1.55* 1.52*  CALCIUM  8.2* 9.3 9.2  GFRNONAA >60 47* 48*  PROT  --  7.2 7.0  ALBUMIN  --  4.5  4.1  AST  --  23 23  ALT  --  19 16  ALKPHOS  --  67 67  BILITOT  --  0.8 1.0    RADIOGRAPHIC STUDIES: I have personally reviewed the radiological images as listed and agreed with the findings in the report. DG C-Arm 1-60 Min-No Report Result Date: 09/30/2023 Fluoroscopy was utilized by the requesting physician.  No radiographic interpretation.

## 2023-10-24 ENCOUNTER — Inpatient Hospital Stay (HOSPITAL_BASED_OUTPATIENT_CLINIC_OR_DEPARTMENT_OTHER)

## 2023-10-24 ENCOUNTER — Other Ambulatory Visit: Payer: Self-pay | Admitting: *Deleted

## 2023-10-24 ENCOUNTER — Inpatient Hospital Stay: Attending: Hematology

## 2023-10-24 VITALS — BP 140/79 | HR 61 | Temp 97.5°F | Resp 20 | Wt 169.9 lb

## 2023-10-24 DIAGNOSIS — Z9221 Personal history of antineoplastic chemotherapy: Secondary | ICD-10-CM | POA: Insufficient documentation

## 2023-10-24 DIAGNOSIS — D649 Anemia, unspecified: Secondary | ICD-10-CM

## 2023-10-24 DIAGNOSIS — C651 Malignant neoplasm of right renal pelvis: Secondary | ICD-10-CM

## 2023-10-24 DIAGNOSIS — N3281 Overactive bladder: Secondary | ICD-10-CM

## 2023-10-24 DIAGNOSIS — Z87891 Personal history of nicotine dependence: Secondary | ICD-10-CM | POA: Diagnosis not present

## 2023-10-24 DIAGNOSIS — R413 Other amnesia: Secondary | ICD-10-CM | POA: Insufficient documentation

## 2023-10-24 DIAGNOSIS — C9141 Hairy cell leukemia, in remission: Secondary | ICD-10-CM | POA: Diagnosis not present

## 2023-10-24 DIAGNOSIS — H9193 Unspecified hearing loss, bilateral: Secondary | ICD-10-CM | POA: Diagnosis not present

## 2023-10-24 LAB — CBC WITH DIFFERENTIAL (CANCER CENTER ONLY)
Abs Immature Granulocytes: 0.01 K/uL (ref 0.00–0.07)
Basophils Absolute: 0 K/uL (ref 0.0–0.1)
Basophils Relative: 0 %
Eosinophils Absolute: 0.1 K/uL (ref 0.0–0.5)
Eosinophils Relative: 1 %
HCT: 32.3 % — ABNORMAL LOW (ref 39.0–52.0)
Hemoglobin: 11 g/dL — ABNORMAL LOW (ref 13.0–17.0)
Immature Granulocytes: 0 %
Lymphocytes Relative: 31 %
Lymphs Abs: 1.4 K/uL (ref 0.7–4.0)
MCH: 32.4 pg (ref 26.0–34.0)
MCHC: 34.1 g/dL (ref 30.0–36.0)
MCV: 95.3 fL (ref 80.0–100.0)
Monocytes Absolute: 0.4 K/uL (ref 0.1–1.0)
Monocytes Relative: 8 %
Neutro Abs: 2.8 K/uL (ref 1.7–7.7)
Neutrophils Relative %: 60 %
Platelet Count: 121 K/uL — ABNORMAL LOW (ref 150–400)
RBC: 3.39 MIL/uL — ABNORMAL LOW (ref 4.22–5.81)
RDW: 14 % (ref 11.5–15.5)
WBC Count: 4.6 K/uL (ref 4.0–10.5)
nRBC: 0 % (ref 0.0–0.2)

## 2023-10-24 LAB — VITAMIN B12: Vitamin B-12: 233 pg/mL (ref 180–914)

## 2023-10-24 LAB — FOLATE: Folate: 15.1 ng/mL (ref >5.9)

## 2023-10-24 MED ORDER — OXYBUTYNIN CHLORIDE ER 5 MG PO TB24: 5.0000 mg | ORAL_TABLET | Freq: Every day | ORAL | 0 refills | Status: DC

## 2023-10-25 LAB — FERRITIN: Ferritin: 75 ng/mL (ref 24–336)

## 2023-10-27 LAB — METHYLMALONIC ACID, SERUM: Methylmalonic Acid, Quantitative: 216 nmol/L (ref 0–378)

## 2023-11-02 ENCOUNTER — Encounter: Payer: Self-pay | Admitting: Urology

## 2023-11-03 ENCOUNTER — Inpatient Hospital Stay (HOSPITAL_BASED_OUTPATIENT_CLINIC_OR_DEPARTMENT_OTHER)

## 2023-11-03 VITALS — BP 142/68 | HR 54 | Temp 97.2°F | Resp 18 | Wt 168.1 lb

## 2023-11-03 DIAGNOSIS — C651 Malignant neoplasm of right renal pelvis: Secondary | ICD-10-CM | POA: Diagnosis not present

## 2023-11-03 DIAGNOSIS — N2889 Other specified disorders of kidney and ureter: Secondary | ICD-10-CM | POA: Diagnosis not present

## 2023-11-03 DIAGNOSIS — N3281 Overactive bladder: Secondary | ICD-10-CM

## 2023-11-03 DIAGNOSIS — C679 Malignant neoplasm of bladder, unspecified: Secondary | ICD-10-CM | POA: Diagnosis not present

## 2023-11-03 MED ORDER — OXYBUTYNIN CHLORIDE 5 MG PO TABS: 5.0000 mg | ORAL_TABLET | Freq: Two times a day (BID) | ORAL | 2 refills | Status: DC

## 2023-11-03 NOTE — Progress Notes (Unsigned)
 [facility] CONSULT NOTE  Patient Care Team: [name], MD as PCP - General (Internal Medicine)  ASSESSMENT & PLAN:  Cameron Wu is a 73 y.o.male with history of NMIBC, Hairy cell leukemia, BPH, IBS being seen at [facility] for [facility] UC of renal pelvis. Referred by Dr. Donnice Siad.  Pathology reviewed from 6/13. RIGHT RENAL PELVIS MASS, BIOPSY:  Detached small fragment of high grade urothelial carcinoma.  CrCl was 48. Borderline renal function.   Patient has significant memory loss, hearing loss.  Discussed the diagnosis today. Given the pathology, recommend CT chest for staging.    Given her memory loss, hearing loss and borderline renal function, and evidence of adjuvant therapy with POUT trial, and nivolumab, recommend nephroureterectomy and follow up with pathology for final staging evaluation if need adjuvant therapy based on POUT trial vs nivolumab can be consdier.  Will communicate with Urology on his imaging and further recommendations. Assessment & Plan   No orders of the defined types were placed in this encounter.  All questions were answered. The patient knows to call the clinic with any problems, questions or concerns. No barriers to learning was detected.  [name], MD 7/17/20254:55 PM  CHIEF COMPLAINTS/PURPOSE OF CONSULTATION:  UTUC  HISTORY OF PRESENTING ILLNESS:  Cameron Wu 73 y.o. male is here because of UTUC.  I have reviewed his chart and materials related to his cancer extensively and collaborated history with the patient.  Patient has h/o NMIBC and had induction BCG. Report since his treatment he has not felt well with fatigue and tiredness.  He underwent cystoscopy, right ureteroscopy with biopsy and found HG UC from R renal pelvis. No UC from the bladder. He was referred here for consideration of neoadjuvant therapy.  At baseline he has memory loss. He had several rounds of chemotherapy for HCL. He has bilateral  hearing loss worse on the right.    MEDICAL HISTORY:  Past Medical History:  Diagnosis Date   Abnormal liver function tests 05/03/2011   Arthritis    Bladder cancer (HCC) 02/2021   high grade nonmuscle invasive bladder cancer   BPH (benign prostatic hypertrophy) 05/03/2011   Chronic kidney disease    Complication of anesthesia    coughs alot and has a very dry, scratchy throat per patient   Elevated transaminase level    GERD (gastroesophageal reflux disease)    Hairy cell leukemia, in remission (HCC) 1992   remission since 2008   History of kidney stones    Hypertension    IBS (irritable bowel syndrome)    Irritable bowel syndrome    2 yrs ago, states he had inflammation around anus. Biopsy: inclusive   Lumbar foraminal stenosis 12/31/2019   Lumbar radiculopathy 11/15/2019   Sleep apnea     SURGICAL HISTORY: Past Surgical History:  Procedure Laterality Date   BACK SURGERY     BONE MARROW BIOPSY     2007 and 1992   CATARACT EXTRACTION W/PHACO Right 08/15/2020   Procedure: CATARACT EXTRACTION PHACO AND INTRAOCULAR LENS PLACEMENT RIGHT EYE;  Surgeon: Harrie Agent, MD;  Location: [facility];  Service: Ophthalmology;  Laterality: Right;  right CDE=7.75   COLONOSCOPY  04/19/2006   with polyps   COLONOSCOPY  09/30/2011   Procedure: COLONOSCOPY;  Surgeon: Claudis RAYMOND Rivet, MD;  Location: [facility];  Service: Endoscopy;  Laterality: N/A;  730   COLONOSCOPY N/A 01/11/2019   Procedure: COLONOSCOPY;  Surgeon: Rivet Claudis RAYMOND, MD;  Location: [facility];  Service: Endoscopy;  Laterality: N/A;  100-office move to 9:30am   CYSTOSCOPY W/ RETROGRADES Bilateral 02/23/2021   Procedure: CYSTOSCOPY WITH RETROGRADE PYELOGRAM;  Surgeon: Matilda Senior, MD;  Location: [facility];  Service: Urology;  Laterality: Bilateral;   CYSTOSCOPY W/ RETROGRADES Bilateral 07/05/2022   Procedure: CYSTOSCOPY WITH RETROGRADE PYELOGRAM;  Surgeon: Matilda Senior, MD;  Location: [facility];  Service: Urology;  Laterality: Bilateral;   CYSTOSCOPY W/ RETROGRADES Right 09/30/2023   Procedure: CYSTOSCOPY, WITH RETROGRADE PYELOGRAM, BIOPSY OF MASS;  Surgeon: Selma Donnice SAUNDERS, MD;  Location: [facility];  Service: Urology;  Laterality: Right;   CYSTOSCOPY WITH BIOPSY N/A 07/05/2022   Procedure: CYSTOSCOPY WITH BIOPSY;  Surgeon: Matilda Senior, MD;  Location: [facility];  Service: Urology;  Laterality: N/A;  30 MINS   CYSTOSCOPY WITH URETEROSCOPY AND STENT PLACEMENT Right 09/30/2023   Procedure: ERNESTA, WITH STENT EXCHANGE;  Surgeon: Selma Donnice SAUNDERS, MD;  Location: [facility];  Service: Urology;  Laterality: Right;   HX COLON POLYPS     TONSILLECTOMY     childhood   TOTAL HIP ARTHROPLASTY Right 12/19/2022   Procedure: RIGHT POSTERIOR TOTAL HIP ARTHROPLASTY;  Surgeon: Edna Toribio LABOR, MD;  Location: [facility];  Service: Orthopedics;  Laterality: Right;   TRANSFORAMINAL LUMBAR INTERBODY FUSION W/ MIS 1 LEVEL Right 11/18/2020   Procedure: Right Lumbar Five-Sacral One Minimally invasive transforaminal lumbar interbody fusion;  Surgeon: Cheryle Debby LABOR, MD;  Location: [facility];  Service: Neurosurgery;  Laterality: Right;   TRANSURETHRAL RESECTION OF BLADDER TUMOR N/A 03/26/2021   Procedure: REPEAT TRANSURETHRAL RESECTION OF BLADDER TUMOR (TURBT)/ POST OPERATIVE INSTILLATION OF GEMCITABINE ;  Surgeon: Matilda Senior, MD;  Location[facility];  Service: Urology;  Laterality: N/A;   TRANSURETHRAL RESECTION OF BLADDER TUMOR WITH MITOMYCIN -C N/A 02/23/2021   Procedure: TRANSURETHRAL RESECTION OF BLADDER TUMOR;  Surgeon: Matilda Senior, MD;  Location: [facility];  Service: Urology;  Laterality: N/A;   TRANSURETHRAL RESECTION OF BLADDER TUMOR WITH MITOMYCIN -C N/A 10/29/2021   Procedure: TRANSURETHRAL RESECTION OF BLADDER TUMOR WITH GEMCITABINE ;  Surgeon: Matilda Senior, MD;  Location: [facility];  Service:  Urology;  Laterality: N/A;    SOCIAL HISTORY: Social History   Socioeconomic History   Marital status: Married    Spouse name: Not on file   Number of children: Not on file   Years of education: Not on file   Highest education level: Not on file  Occupational History   Not on file  Tobacco Use   Smoking status: Former    Current packs/day: 0.00    Types: Cigarettes    Quit date: 12/18/2005    Years since quitting: 17.8   Smokeless tobacco: Never  Vaping Use   Vaping status: Never Used  Substance and Sexual Activity   Alcohol use: Yes    Alcohol/week: 4.0 standard drinks of alcohol    Types: 4 Cans of beer per week    Comment: social   Drug use: No   Sexual activity: Not on file  Other Topics Concern   Not on file  Social History Narrative   Not on file   Social Drivers of Health   Financial Resource Strain: Not on file  Food Insecurity: No Food Insecurity (10/24/2023)   Hunger Vital Sign    Worried About Running Out of Food in the Last Year: Never true    Ran Out of Food in the Last Year: Never true  Transportation Needs:  No Transportation Needs (10/24/2023)   PRAPARE - Administrator, Civil Service (Medical): No    Lack of Transportation (Non-Medical): No  Physical Activity: Not on file  Stress: Not on file  Social Connections: Not on file  Intimate Partner Violence: Not At Risk (10/24/2023)   Humiliation, Afraid, Rape, and Kick questionnaire    Fear of Current or Ex-Partner: No    Emotionally Abused: No    Physically Abused: No    Sexually Abused: No    FAMILY HISTORY: No family history on file.  ALLERGIES:  is allergic to penicillins and pollen extract.  MEDICATIONS:  Current Outpatient Medications  Medication Sig Dispense Refill   acetaminophen  (TYLENOL ) 500 MG tablet Take 1,000 mg by mouth every 8 (eight) hours as needed for moderate pain (pain score 4-6).     Calcium  Carb-Cholecalciferol (CALCIUM  + VITAMIN D3 PO) Take 1 tablet by mouth daily  with lunch.     CHARCOAL ACTIVATED PO Take 2 tablets by mouth daily as needed (upset stomach). 520 mg     clonazePAM  (KLONOPIN ) 0.5 MG tablet Take 0.5 mg by mouth daily as needed for anxiety.     docusate sodium  (COLACE) 100 MG capsule Take 1 capsule (100 mg total) by mouth daily as needed for up to 30 doses. 30 capsule 0   donepezil  (ARICEPT ) 10 MG tablet Take 10 mg by mouth daily.     doxylamine , Sleep, (UNISOM ) 25 MG tablet Take 25 mg by mouth at bedtime as needed for sleep.     finasteride  (PROSCAR ) 5 MG tablet Take 1 tablet (5 mg total) by mouth every other day. 90 tablet 3   Ginkgo Biloba (GINKOBA PO) Take 120 mg by mouth daily.     Homeopathic Products (LEG CRAMPS PM SL) Place 2 tablets under the tongue daily as needed (leg cramps).     levocetirizine (XYZAL) 5 MG tablet Take 5 mg by mouth at bedtime as needed for allergies.     loperamide (IMODIUM) 2 MG capsule Take 2 mg by mouth as needed for diarrhea or loose stools.     Menthol , Topical Analgesic, (BIOFREEZE COOL THE PAIN) 4 % GEL Apply 1 Application topically as needed (pain).     Multiple Vitamins-Minerals (ONE-A-DAY MENS 50+) TABS Take 1 tablet by mouth daily.     Naphazoline-Pheniramine (OPCON-A) 0.027-0.315 % SOLN Place 1 drop into both eyes as needed (eye irritation).     oxybutynin  (DITROPAN  XL) 5 MG 24 hr tablet Take 1 tablet (5 mg total) by mouth at bedtime. 30 tablet 0   pantoprazole (PROTONIX) 40 MG tablet Take 40 mg by mouth every morning.     rosuvastatin  (CRESTOR ) 10 MG tablet Take 10 mg by mouth at bedtime.     Saline (ARY NASAL MIST ALLERGY/SINUS) 2.65 % SOLN Place 1 spray into the nose as needed (congestion).     simethicone  (MYLICON) 125 MG chewable tablet Chew 125 mg by mouth 2 (two) times daily as needed for flatulence.     tamsulosin  (FLOMAX ) 0.4 MG CAPS capsule Take 1 capsule (0.4 mg total) by mouth every other day. 90 capsule 3   No current facility-administered medications for this visit.    Facility-Administered Medications Ordered in Other Visits  Medication Dose Route Frequency Provider Last Rate Last Admin   gemcitabine  (GEMZAR ) chemo syringe for bladder instillation 2,000 mg  2,000 mg Bladder Instillation Once Dahlstedt, Stephen, MD        REVIEW OF SYSTEMS:   All relevant systems  were reviewed with the patient and are negative.  PHYSICAL EXAMINATION: ECOG PERFORMANCE STATUS: 1 - Symptomatic but completely ambulatory  Vitals:   11/03/23 1632  BP: (!) 142/68  Pulse: (!) 54  Resp: 18  Temp: (!) 97.2 F (36.2 C)  SpO2: 99%   Filed Weights   11/03/23 1632  Weight: 168 lb 1.6 oz (76.2 kg)    GENERAL: alert, no distress and comfortable SKIN: skin color is normal, no jaundice EYES: sclera clear LYMPH:  no palpable lymphadenopathy in the cervical, axillary regions LUNGS: Effort normal, no respiratory distress.  Clear to auscultation bilaterally HEART: regular rate & rhythm and no lower extremity edema ABDOMEN: soft, non-tender and nondistended  LABORATORY DATA:  I have reviewed the data as listed Lab Results  Component Value Date   WBC 4.6 10/24/2023   HGB 11.0 (L) 10/24/2023   HCT 32.3 (L) 10/24/2023   MCV 95.3 10/24/2023   PLT 121 (L) 10/24/2023   Recent Labs    12/20/22 0344 08/24/23 1129 09/29/23 1043  NA 134* 141 141  K 4.0 3.7 3.7  CL 102 103 105  CO2 25 31 28   GLUCOSE 156* 97 93  BUN 16 17 16   CREATININE 0.93 1.55* 1.52*  CALCIUM  8.2* 9.3 9.2  GFRNONAA >60 47* 48*  PROT  --  7.2 7.0  ALBUMIN  --  4.5 4.1  AST  --  23 23  ALT  --  19 16  ALKPHOS  --  67 67  BILITOT  --  0.8 1.0    RADIOGRAPHIC STUDIES: I have personally reviewed the radiological images as listed and agreed with the findings in the report. No results found.

## 2023-11-04 ENCOUNTER — Other Ambulatory Visit: Payer: Self-pay | Admitting: Urology

## 2023-11-04 ENCOUNTER — Encounter: Payer: Self-pay | Admitting: Urology

## 2023-11-04 DIAGNOSIS — N2889 Other specified disorders of kidney and ureter: Secondary | ICD-10-CM | POA: Insufficient documentation

## 2023-11-04 DIAGNOSIS — D49511 Neoplasm of unspecified behavior of right kidney: Secondary | ICD-10-CM

## 2023-11-04 NOTE — Assessment & Plan Note (Signed)
 We will proceed with surgical evaluation at [facility] along with renal mass

## 2023-11-04 NOTE — Assessment & Plan Note (Signed)
 Right renal mass.  Recommend surgery and follow-up with pathology

## 2023-11-08 ENCOUNTER — Telehealth: Payer: Self-pay

## 2023-11-08 NOTE — Telephone Encounter (Signed)
 Left the patient a voicemail with the scheduled appointment details.

## 2023-11-14 ENCOUNTER — Ambulatory Visit: Payer: Self-pay

## 2023-11-17 DIAGNOSIS — Z9189 Other specified personal risk factors, not elsewhere classified: Secondary | ICD-10-CM | POA: Insufficient documentation

## 2023-11-17 NOTE — Assessment & Plan Note (Addendum)
 Will plan on EVP Teaching appointment Start treatment

## 2023-11-17 NOTE — Progress Notes (Unsigned)
  [facility]cer [facility] OFFICE PROGRESS NOTE  Patient Care Team: Cameron Carwin, MD as PCP - General (Internal Medicine)  Cameron Wu is a 73 y.o.male with history of NMIBC, Hairy cell leukemia, BPH, IBS being seen at [facility] for [facility] UC of renal pelvis. Referred by Dr. Donnice Wu.   Pathology reviewed from 6/13. RIGHT RENAL PELVIS MASS, BIOPSY:  Detached small fragment of high grade urothelial carcinoma.   CrCl was 48. Borderline renal function. Met with [facility] team and also felt renal mass if from UC metastasis. Given renal vein involvement recommend systemic treatment prior to surgery and if consider surgery if interval response. Discussed with patient today. He is not a platinum candidate, would treat with EVP for 4 cycles and repeat imaging.  Potential side effects of enfortumab vedotin may include rash, increased blood sugar, neuropathy, electrolyte abnormalities, weight loss, abdominal pain, nausea, vomiting, constipation, diarrhea, increased liver enzyme, UTI, decreased blood counts, infection, musculoskeletal pain, increased creatinine, visual change of blurry vision, and rarely severe dermatologic toxicity, resulting in hospitalization, exfoliation and Cameron Wu syndrome can occur.  Severe hyperglycemia can occur.  This can be life-threatening. After discussion, the patient expresses understanding and would like to proceed.  Side effects of immunotherapy are mostly related to immune related reaction.  They depend on which organ may be affected.  Patient can have hyper or hypothyroidism, skin rash, fever, pneumonitis, hepatitis, carditis, colitis, nephritis or other endorgan damage from immune related reaction.  Severe fatal reaction such as carditis or encephalitis has been reported. Rarely severe side effects can result in death.  After discussion Cameron Wu understands and would like to proceed.  Will plan on teaching, port and start treatment on 8/15. Return to see me on 8/19  on day 8 of treatment.  I spent a total of 47 minutes including review of chart and various tests results, face-to-face time with the patient, discussions about results, review of outside records, discussed side effects of treatment and monitoring, plan of care and coordination of care plan with other providers and staff members.  Assessment & Plan Malignant neoplasm of urinary bladder, unspecified site [facility]) Will plan on EVP to start on 8/15 Teaching appointment Education material given to patient. Start treatment  Port on 8/7 scheduled at [employer]. At risk for side effect of medication Toxicities being monitored in relation to EV Monitor for skin toxicity Prophylactic Desitin twice daily to intertriginous, flexural, and acral areas Apply emollient moisturizer twice a day Use sunscreen if outside for prolonged period of time. Hydration 64+ ounces a day Avoid hot showers Monitor for neuropathy Monitor for hyperglycemia.  Baseline A1c Monitor for signs of pneumonitis Warning signs: Malaise.  Fever of more than 100.4.  Mucosal involvement in the ocular, oral or genital area.  Skin pain, burning, numbness tingling. Monitor for diarrhea and other GI symptoms Pulmonary nodules Report less than 4 mm bilaterally.  Will need to monitor with future scan. Need to request outside film be uploaded for future comparison Malignant neoplasm of overlapping sites of bladder [name]) Will plan on EVP Teaching appointment Education material given to patient. Start treatment   Orders Placed This Encounter  Procedures   Consent Attestation for Oncology Treatment    The patient is informed of risks, benefits, side-effects of the prescribed oncology treatment. Potential short term and long term side effects and response rates discussed. After a long discussion, the patient made informed decision to proceed.:   Yes   CT OUTSIDE FILMS BODY/ABD/PELVIS    Standing  Status:   Future    Number of Occurrences:   1     Expiration Date:   11/17/2024    Scheduling Instructions:     [facility]    Where was this CD imported?:   [facility]   CT OUTSIDE FILMS CHEST    Standing Status:   Future    Number of Occurrences:   1    Expiration Date:   11/17/2024    Where was this CD imported?:   [facility]   IR IMAGING GUIDED PORT INSERTION    Standing Status:   Future    Expiration Date:   11/17/2024    Reason for Exam (SYMPTOM  OR DIAGNOSIS REQUIRED):   need for chemo    Preferred Imaging Location?:   [facility]   CBC with Differential (Cancer Center Only)    Standing Status:   Future    Expected Date:   11/29/2023    Expiration Date:   11/28/2024   CMP (Cancer Center only)    Standing Status:   Future    Expected Date:   11/29/2023    Expiration Date:   11/28/2024   T4    Standing Status:   Future    Expected Date:   11/29/2023    Expiration Date:   11/28/2024   TSH    Standing Status:   Future    Expected Date:   11/29/2023    Expiration Date:   11/28/2024   CBC with Differential (Cancer Center Only)    Standing Status:   Future    Expected Date:   12/06/2023    Expiration Date:   12/05/2024   CMP (Cancer Center only)    Standing Status:   Future    Expected Date:   12/06/2023    Expiration Date:   12/05/2024   CBC with Differential (Cancer Center Only)    Standing Status:   Future    Expected Date:   12/20/2023    Expiration Date:   12/19/2024   CMP (Cancer Center only)    Standing Status:   Future    Expected Date:   12/20/2023    Expiration Date:   12/19/2024   CBC with Differential (Cancer Center Only)    Standing Status:   Future    Expected Date:   12/27/2023    Expiration Date:   12/26/2024   CMP (Cancer Center only)    Standing Status:   Future    Expected Date:   12/27/2023    Expiration Date:   12/26/2024   CBC with Differential (Cancer Center Only)    Standing Status:   Future    Number of Occurrences:   1    Expiration Date:   11/17/2024   CMP (Cancer Center only)     Standing Status:   Future    Number of Occurrences:   1    Expiration Date:   11/17/2024   PHYSICIAN COMMUNICATION ORDER    Enfortumab: Diabetic ketoacidosis may occur in patients with and without preexisting diabetes mellitus, which may be fatal. Closely monitor blood glucose levels in patients with, or at risk for, diabetes mellitus or hyperglycemia. Withhold if blood glucose >250 mg/dL.   ONCBCN PHYSICIAN COMMUNICATION 1    In Hoimes CJ, et al., patients were permitted to continue study treatment until radiographically confirmed disease progression, unacceptable toxicity, investigator decision, consent withdrawal, the start of subsequent anticancer therapy, or pregnancy. Please use physician discretion when adding  additional cycles after cycle 4   ONCBCN PHYSICIAN COMMUNICATION 2    Enfortumab: Monitor patients for signs or symptoms of ocular disorders. Consider prophylactic artificial tears for dry eyes and treatment with ophthalmic topical steroids after an ophthalmic exam. Consider dose interruption or dose reduction when symptomatic ocular disorders occur.   ONCBCN PHYSICIAN COMMUNICATION 5    Thyroid  function tests at baseline and every 3rd cycle     [name], MD  INTERVAL HISTORY: Patient returns for follow-up.  Oncology History: Patient has h/o NMIBC and had induction BCG. He did not tolerate BCG well with fatigue and tiredness. repeat cystoscopy and right ureteroscopy with biopsy in June 2025 found HG UC from R renal pelvis.   10/14/23 CT [facility]: 5.5 x 4 cm right kidney mass.  Slight obstruction of the upper pole calyces on the right.  This does appear to be extension into the right renal vein.  Double-J right ureteral stent with proximal loop formed in the region of the right ureteropelvic junction and distal loop formed in the bladder.  Pericaval and right periaortic lymph nodes up to 1.4 cm.  10/14/23 CT Chest: Scattered tiny nodules measuring less than 4 mm identified including  left upper lobe, right upper lobe, right lower lobe.  No overtly suspicious nodule or mass.  1.9 cm left thyroid  nodule.  PHYSICAL EXAMINATION: ECOG PERFORMANCE STATUS: 1 - Symptomatic but completely ambulatory  Vitals:   11/18/23 1049  BP: (!) 160/62  Pulse: 73  Resp: 16  Temp: (!) 97 F (36.1 C)  SpO2: 100%   Filed Weights   11/18/23 1049  Weight: 168 lb 8 oz (76.4 kg)    GENERAL: alert, no distress and comfortable SKIN: skin color normal and no bruising or petechiae or jaundice on exposed skin   Relevant data reviewed during this visit included labs, pathology and imaging.

## 2023-11-17 NOTE — Assessment & Plan Note (Addendum)
 Toxicities being monitored in relation to EV Monitor for skin toxicity Prophylactic Desitin twice daily to intertriginous, flexural, and acral areas Apply emollient moisturizer twice a day Use sunscreen if outside for prolonged period of time. Hydration 64+ ounces a day Avoid hot showers Monitor for neuropathy Monitor for hyperglycemia.  Baseline A1c Monitor for signs of pneumonitis Warning signs: Malaise.  Fever of more than 100.4.  Mucosal involvement in the ocular, oral or genital area.  Skin pain, burning, numbness tingling. Monitor for diarrhea and other GI symptoms

## 2023-11-18 ENCOUNTER — Ambulatory Visit
Admission: RE | Admit: 2023-11-18 | Discharge: 2023-11-18 | Disposition: A | Discharge status: Home / Self Care | Payer: Self-pay | Source: Ambulatory Visit

## 2023-11-18 ENCOUNTER — Inpatient Hospital Stay: Attending: Hematology

## 2023-11-18 ENCOUNTER — Inpatient Hospital Stay

## 2023-11-18 VITALS — BP 160/62 | HR 73 | Temp 97.0°F | Resp 16 | Wt 168.5 lb

## 2023-11-18 DIAGNOSIS — C679 Malignant neoplasm of bladder, unspecified: Secondary | ICD-10-CM | POA: Diagnosis not present

## 2023-11-18 DIAGNOSIS — C9141 Hairy cell leukemia, in remission: Secondary | ICD-10-CM | POA: Insufficient documentation

## 2023-11-18 DIAGNOSIS — Z9189 Other specified personal risk factors, not elsewhere classified: Secondary | ICD-10-CM | POA: Diagnosis not present

## 2023-11-18 DIAGNOSIS — C651 Malignant neoplasm of right renal pelvis: Secondary | ICD-10-CM | POA: Diagnosis present

## 2023-11-18 DIAGNOSIS — C678 Malignant neoplasm of overlapping sites of bladder: Secondary | ICD-10-CM | POA: Diagnosis not present

## 2023-11-18 DIAGNOSIS — Z79620 Long term (current) use of immunosuppressive biologic: Secondary | ICD-10-CM | POA: Diagnosis not present

## 2023-11-18 DIAGNOSIS — R918 Other nonspecific abnormal finding of lung field: Secondary | ICD-10-CM | POA: Diagnosis not present

## 2023-11-18 DIAGNOSIS — Z9221 Personal history of antineoplastic chemotherapy: Secondary | ICD-10-CM | POA: Diagnosis not present

## 2023-11-18 DIAGNOSIS — Z87891 Personal history of nicotine dependence: Secondary | ICD-10-CM | POA: Diagnosis not present

## 2023-11-18 DIAGNOSIS — Z5112 Encounter for antineoplastic immunotherapy: Secondary | ICD-10-CM | POA: Insufficient documentation

## 2023-11-18 LAB — CBC WITH DIFFERENTIAL (CANCER CENTER ONLY)
Abs Immature Granulocytes: 0.01 K/uL (ref 0.00–0.07)
Basophils Absolute: 0 K/uL (ref 0.0–0.1)
Basophils Relative: 0 %
Eosinophils Absolute: 0.1 K/uL (ref 0.0–0.5)
Eosinophils Relative: 1 %
HCT: 34.4 % — ABNORMAL LOW (ref 39.0–52.0)
Hemoglobin: 11.8 g/dL — ABNORMAL LOW (ref 13.0–17.0)
Immature Granulocytes: 0 %
Lymphocytes Relative: 21 %
Lymphs Abs: 1.2 K/uL (ref 0.7–4.0)
MCH: 32.7 pg (ref 26.0–34.0)
MCHC: 34.3 g/dL (ref 30.0–36.0)
MCV: 95.3 fL (ref 80.0–100.0)
Monocytes Absolute: 0.4 K/uL (ref 0.1–1.0)
Monocytes Relative: 7 %
Neutro Abs: 4.2 K/uL (ref 1.7–7.7)
Neutrophils Relative %: 71 %
Platelet Count: 132 K/uL — ABNORMAL LOW (ref 150–400)
RBC: 3.61 MIL/uL — ABNORMAL LOW (ref 4.22–5.81)
RDW: 14 % (ref 11.5–15.5)
WBC Count: 5.9 K/uL (ref 4.0–10.5)
nRBC: 0 % (ref 0.0–0.2)

## 2023-11-18 LAB — CMP (CANCER CENTER ONLY)
ALT: 15 U/L (ref 0–44)
AST: 19 U/L (ref 15–41)
Albumin: 4.3 g/dL (ref 3.5–5.0)
Alkaline Phosphatase: 74 U/L (ref 38–126)
Anion gap: 3 — ABNORMAL LOW (ref 5–15)
BUN: 16 mg/dL (ref 8–23)
CO2: 33 mmol/L — ABNORMAL HIGH (ref 22–32)
Calcium: 9.4 mg/dL (ref 8.9–10.3)
Chloride: 105 mmol/L (ref 98–111)
Creatinine: 1.47 mg/dL — ABNORMAL HIGH (ref 0.61–1.24)
GFR, Estimated: 50 mL/min — ABNORMAL LOW (ref >60)
Glucose, Bld: 99 mg/dL (ref 70–99)
Potassium: 4 mmol/L (ref 3.5–5.1)
Sodium: 141 mmol/L (ref 135–145)
Total Bilirubin: 0.6 mg/dL (ref 0.0–1.2)
Total Protein: 7.1 g/dL (ref 6.5–8.1)

## 2023-11-18 MED ORDER — PROCHLORPERAZINE MALEATE 10 MG PO TABS: 10.0000 mg | ORAL_TABLET | Freq: Four times a day (QID) | ORAL | 1 refills | Status: DC | PRN

## 2023-11-18 MED ORDER — LIDOCAINE-PRILOCAINE 2.5-2.5 % EX CREA: TOPICAL_CREAM | CUTANEOUS | 3 refills | Status: DC

## 2023-11-18 MED ORDER — ONDANSETRON HCL 8 MG PO TABS: 8.0000 mg | ORAL_TABLET | Freq: Three times a day (TID) | ORAL | 1 refills | Status: DC | PRN

## 2023-11-18 MED ORDER — CARBOXYMETHYLCELL-GLYCERIN PF 0.5-0.9 % OP SOLN: 2.0000 [drp] | Freq: Four times a day (QID) | OPHTHALMIC | 11 refills | Status: DC

## 2023-11-18 NOTE — Patient Instructions (Signed)
 We will plan on starting enfortumab vedotin(EV) with pembrolizumab for 4 cycles and then restaging.  Potential side effects of enfortumab vedotin may include rash, increased blood sugar, neuropathy, electrolyte abnormalities, weight loss, abdominal pain, nausea, vomiting, constipation, diarrhea, increased liver enzyme, UTI, decreased blood counts, infection, musculoskeletal pain, increased creatinine, visual change of blurry vision, and rarely severe dermatologic toxicity, resulting in hospitalization, exfoliation and [name].  Severe hyperglycemia can occur.  This can be life-threatening. After discussion, the patient expresses understanding and would like to proceed.  Side effects of immunotherapy are mostly related to immune related reaction.  They depend on which organ may be affected.  Patient can have hyper or hypothyroidism, skin rash, fever, pneumonitis, hepatitis, carditis, colitis, nephritis or other endorgan damage from immune related reaction.  Severe fatal reaction such as carditis or encephalitis has been reported. Rarely severe side effects can result in death.  After discussion Cameron Wu understands and would like to proceed.  Toxicities being monitored in relation to EV Monitor for skin toxicity Use Desitin twice daily to intertriginous, flexural, and acral areas Apply emollient moisturizer twice a day Use sunscreen if outside for prolonged period of time. Hydration 64+ ounces a day Avoid hot showers Monitor for neuropathy Monitor for hyperglycemia.  Baseline A1c Monitor for signs of coughing out of ordinary Warning signs: Malaise.  Fever of more than 100.4.  Mucosal involvement in the ocular, oral or genital area.  Skin pain, burning, numbness tingling. Monitor for diarrhea

## 2023-11-18 NOTE — Progress Notes (Signed)
 START ON PATHWAY REGIMEN - Bladder     A cycle is every 21 days:     Enfortumab vedotin-ejfv      Pembrolizumab   **Always confirm dose/schedule in your pharmacy ordering system**  Patient Characteristics: Advanced/Metastatic Disease, First Line Therapeutic Status: Advanced/Metastatic Disease Line of Therapy: First Line Intent of Therapy: Non-Curative / Palliative Intent, Discussed with Patient

## 2023-11-18 NOTE — Assessment & Plan Note (Addendum)
 Report less than 4 mm bilaterally.  Will need to monitor with future scan. Need to request outside film be uploaded for future comparison

## 2023-11-18 NOTE — Assessment & Plan Note (Signed)
 Will plan on EVP Teaching appointment Education material given to patient. Start treatment

## 2023-11-19 ENCOUNTER — Other Ambulatory Visit: Payer: Self-pay

## 2023-11-23 ENCOUNTER — Other Ambulatory Visit: Payer: Self-pay | Admitting: Radiology

## 2023-11-23 ENCOUNTER — Inpatient Hospital Stay

## 2023-11-23 DIAGNOSIS — Z01818 Encounter for other preprocedural examination: Secondary | ICD-10-CM

## 2023-11-23 NOTE — H&P (Signed)
 Chief Complaint: High grade urothelial carcinoma right renal pelvis; referred for port a cath placement to assist with treatment  Referring Provider(s): Chang,R  Supervising Physician: Jennefer Rover  Patient Status: [facility] - Out-pt  History of Present Illness: Cameron Wu is a 73 y.o. male ex-smoker with past medical history of arthritis, bladder cancer 2022, BPH, chronic kidney disease, GERD, hairy cell leukemia in remission, nephrolithiasis, hypertension, IBS, sleep apnea, lumbar stenosis who presents now with high-grade urothelial carcinoma of the right renal pelvis.  He is scheduled today for Port-A-Cath placement to assist with treatment.  *** Patient is Full Code  Past Medical History:  Diagnosis Date   Abnormal liver function tests 05/03/2011   Arthritis    Bladder cancer (HCC) 02/2021   high grade nonmuscle invasive bladder cancer   BPH (benign prostatic hypertrophy) 05/03/2011   Chronic kidney disease    Complication of anesthesia    coughs alot and has a very dry, scratchy throat per patient   Elevated transaminase level    GERD (gastroesophageal reflux disease)    Hairy cell leukemia, in remission (HCC) 1992   remission since 2008   History of kidney stones    Hypertension    IBS (irritable bowel syndrome)    Irritable bowel syndrome    2 yrs ago, states he had inflammation around anus. Biopsy: inclusive   Lumbar foraminal stenosis 12/31/2019   Lumbar radiculopathy 11/15/2019   Sleep apnea     Past Surgical History:  Procedure Laterality Date   BACK SURGERY     BONE MARROW BIOPSY     2007 and 1992   CATARACT EXTRACTION W/PHACO Right 08/15/2020   Procedure: CATARACT EXTRACTION PHACO AND INTRAOCULAR LENS PLACEMENT RIGHT EYE;  Surgeon: Harrie Agent, MD;  Location: [facility];  Service: Ophthalmology;  Laterality: Right;  right CDE=7.75   COLONOSCOPY  04/19/2006   with polyps   COLONOSCOPY  09/30/2011   Procedure: COLONOSCOPY;  Surgeon: Claudis RAYMOND Rivet,  MD;  Location: [facility];  Service: Endoscopy;  Laterality: N/A;  730   COLONOSCOPY N/A 01/11/2019   Procedure: COLONOSCOPY;  Surgeon: Rivet Claudis RAYMOND, MD;  Location: [facility];  Service: Endoscopy;  Laterality: N/A;  100-office move to 9:30am   CYSTOSCOPY W/ RETROGRADES Bilateral 02/23/2021   Procedure: CYSTOSCOPY WITH RETROGRADE PYELOGRAM;  Surgeon: Matilda Senior, MD;  Location: [facility];  Service: Urology;  Laterality: Bilateral;   CYSTOSCOPY W/ RETROGRADES Bilateral 07/05/2022   Procedure: CYSTOSCOPY WITH RETROGRADE PYELOGRAM;  Surgeon: Matilda Senior, MD;  Location: [facility];  Service: Urology;  Laterality: Bilateral;   CYSTOSCOPY W/ RETROGRADES Right 09/30/2023   Procedure: CYSTOSCOPY, WITH RETROGRADE PYELOGRAM, BIOPSY OF MASS;  Surgeon: Selma Donnice SAUNDERS, MD;  Location: [facility];  Service: Urology;  Laterality: Right;   CYSTOSCOPY WITH BIOPSY N/A 07/05/2022   Procedure: CYSTOSCOPY WITH BIOPSY;  Surgeon: Matilda Senior, MD;  Location: [facility];  Service: Urology;  Laterality: N/A;  30 MINS   CYSTOSCOPY WITH URETEROSCOPY AND STENT PLACEMENT Right 09/30/2023   Procedure: ERNESTA, WITH STENT EXCHANGE;  Surgeon: Selma Donnice SAUNDERS, MD;  Location: [facility];  Service: Urology;  Laterality: Right;   HX COLON POLYPS     TONSILLECTOMY     childhood   TOTAL HIP ARTHROPLASTY Right 12/19/2022   Procedure: RIGHT POSTERIOR TOTAL HIP ARTHROPLASTY;  Surgeon: Edna Toribio LABOR, MD;  Location: [facility];  Service: Orthopedics;  Laterality: Right;   TRANSFORAMINAL LUMBAR INTERBODY FUSION W/ MIS 1  LEVEL Right 11/18/2020   Procedure: Right Lumbar Five-Sacral One Minimally invasive transforaminal lumbar interbody fusion;  Surgeon: Cheryle Debby LABOR, MD;  Location: [facility];  Service: Neurosurgery;  Laterality: Right;   TRANSURETHRAL RESECTION OF BLADDER TUMOR N/A 03/26/2021   Procedure: REPEAT TRANSURETHRAL RESECTION OF BLADDER TUMOR (TURBT)/  POST OPERATIVE INSTILLATION OF GEMCITABINE ;  Surgeon: Matilda Senior, MD;  Location: [facility];  Service: Urology;  Laterality: N/A;   TRANSURETHRAL RESECTION OF BLADDER TUMOR WITH MITOMYCIN -C N/A 02/23/2021   Procedure: TRANSURETHRAL RESECTION OF BLADDER TUMOR;  Surgeon: Matilda Senior, MD;  Location: [facility];  Service: Urology;  Laterality: N/A;   TRANSURETHRAL RESECTION OF BLADDER TUMOR WITH MITOMYCIN -C N/A 10/29/2021   Procedure: TRANSURETHRAL RESECTION OF BLADDER TUMOR WITH GEMCITABINE ;  Surgeon: Matilda Senior, MD;  Location: [facility];  Service: Urology;  Laterality: N/A;    Allergies: Penicillins and Pollen extract  Medications: Prior to Admission medications   Medication Sig Start Date End Date Taking? Authorizing Provider  acetaminophen  (TYLENOL ) 500 MG tablet Take 1,000 mg by mouth every 8 (eight) hours as needed for moderate pain (pain score 4-6).    [provider]  Calcium  Carb-Cholecalciferol (CALCIUM  + VITAMIN D3 PO) Take 1 tablet by mouth daily with lunch.    [provider]  Carboxymethylcell-Glycerin  PF 0.5-0.9 % SOLN Place 2 drops into both eyes 4 (four) times daily. 11/18/23   Tina Pauletta BROCKS, MD  CHARCOAL ACTIVATED PO Take 2 tablets by mouth daily as needed (upset stomach). 520 mg    [provider]  clonazePAM  (KLONOPIN ) 0.5 MG tablet Take 0.5 mg by mouth daily as needed for anxiety. 11/22/22   [provider]  docusate sodium  (COLACE) 100 MG capsule Take 1 capsule (100 mg total) by mouth daily as needed for up to 30 doses. 09/30/23   Selma Donnice SAUNDERS, MD  donepezil  (ARICEPT ) 10 MG tablet Take 10 mg by mouth daily.    [provider]  doxylamine , Sleep, (UNISOM ) 25 MG tablet Take 25 mg by mouth at bedtime as needed for sleep.    [provider]  finasteride  (PROSCAR ) 5 MG tablet Take 1 tablet (5 mg total) by mouth every other day. 02/28/23   Matilda Senior, MD   Ginkgo Biloba (GINKOBA PO) Take 120 mg by mouth daily.    [provider]  Homeopathic Products (LEG CRAMPS PM SL) Place 2 tablets under the tongue daily as needed (leg cramps).    [provider]  levocetirizine (XYZAL) 5 MG tablet Take 5 mg by mouth at bedtime as needed for allergies.    [provider]  lidocaine -prilocaine  (EMLA ) cream Apply to affected area once 11/18/23   Tina Pauletta BROCKS, MD  loperamide (IMODIUM) 2 MG capsule Take 2 mg by mouth as needed for diarrhea or loose stools.    [provider]  Menthol , Topical Analgesic, (BIOFREEZE COOL THE PAIN) 4 % GEL Apply 1 Application topically as needed (pain).    [provider]  Multiple Vitamins-Minerals (ONE-A-DAY MENS 50+) TABS Take 1 tablet by mouth daily.    [provider]  Naphazoline-Pheniramine (OPCON-A) 0.027-0.315 % SOLN Place 1 drop into both eyes as needed (eye irritation).    [provider]  ondansetron  (ZOFRAN ) 8 MG tablet Take 1 tablet (8 mg total) by mouth every 8 (eight) hours as needed for nausea or vomiting. 11/18/23   Tina Pauletta BROCKS, MD  oxybutynin  (DITROPAN ) 5 MG tablet Take 1 tablet (5 mg total) by  mouth 2 (two) times daily. 11/03/23   Tina Pauletta BROCKS, MD  pantoprazole (PROTONIX) 40 MG tablet Take 40 mg by mouth every morning. 09/19/23   [provider]  prochlorperazine  (COMPAZINE ) 10 MG tablet Take 1 tablet (10 mg total) by mouth every 6 (six) hours as needed for nausea or vomiting. 11/18/23   Tina Pauletta BROCKS, MD  rosuvastatin  (CRESTOR ) 10 MG tablet Take 10 mg by mouth at bedtime.    [provider]  Saline (ARY NASAL MIST ALLERGY/SINUS) 2.65 % SOLN Place 1 spray into the nose as needed (congestion).    [provider]  simethicone  (MYLICON) 125 MG chewable tablet Chew 125 mg by mouth 2 (two) times daily as needed for flatulence.    [provider]  tamsulosin  (FLOMAX ) 0.4 MG CAPS capsule Take 1 capsule (0.4 mg total) by mouth  every other day. 08/10/23   Matilda Senior, MD     No family history on file.  Social History   Socioeconomic History   Marital status: Married    Spouse name: Not on file   Number of children: Not on file   Years of education: Not on file   Highest education level: Not on file  Occupational History   Not on file  Tobacco Use   Smoking status: Former    Current packs/day: 0.00    Types: Cigarettes    Quit date: 12/18/2005    Years since quitting: 17.9   Smokeless tobacco: Never  Vaping Use   Vaping status: Never Used  Substance and Sexual Activity   Alcohol use: Yes    Alcohol/week: 4.0 standard drinks of alcohol    Types: 4 Cans of beer per week    Comment: social   Drug use: No   Sexual activity: Not on file  Other Topics Concern   Not on file  Social History Narrative   Not on file   Social Drivers of Health   Financial Resource Strain: Not on file  Food Insecurity: No Food Insecurity (10/24/2023)   Hunger Vital Sign    Worried About Running Out of Food in the Last Year: Never true    Ran Out of Food in the Last Year: Never true  Transportation Needs: No Transportation Needs (10/24/2023)   PRAPARE - Administrator, Civil Service (Medical): No    Lack of Transportation (Non-Medical): No  Physical Activity: Not on file  Stress: Not on file  Social Connections: Not on file       Review of Systems  Vital Signs:   Advance Care Plan: No documents on file.  Physical Exam  Imaging: No results found.  Labs:  CBC: Recent Labs    08/24/23 1129 09/29/23 1043 10/24/23 1405 11/18/23 1141  WBC 4.9 5.3 4.6 5.9  HGB 12.0* 11.3* 11.0* 11.8*  HCT 34.7* 35.7* 32.3* 34.4*  PLT 154 132* 121* 132*    COAGS: No results for input(s): INR, APTT in the last 8760 hours.  BMP: Recent Labs    12/20/22 0344 08/24/23 1129 09/29/23 1043 11/18/23 1141  NA 134* 141 141 141  K 4.0 3.7 3.7 4.0  CL 102 103 105 105  CO2 25 31 28  33*  GLUCOSE  156* 97 93 99  BUN 16 17 16 16   CALCIUM  8.2* 9.3 9.2 9.4  CREATININE 0.93 1.55* 1.52* 1.47*  GFRNONAA >60 47* 48* 50*    LIVER FUNCTION TESTS: Recent Labs    08/24/23 1129 09/29/23 1043 11/18/23 1141  BILITOT  0.8 1.0 0.6  AST 23 23 19   ALT 19 16 15   ALKPHOS 67 67 74  PROT 7.2 7.0 7.1  ALBUMIN 4.5 4.1 4.3    TUMOR MARKERS: No results for input(s): AFPTM, CEA, CA199, CHROMGRNA in the last 8760 hours.  Assessment and Plan: 73 y.o. male ex-smoker with past medical history of arthritis, bladder cancer 2022, BPH, chronic kidney disease, GERD, hairy cell leukemia in remission, nephrolithiasis, hypertension, IBS, sleep apnea, lumbar stenosis who presents now with high-grade urothelial carcinoma of the right renal pelvis.  He is scheduled today for Port-A-Cath placement to assist with treatment.Risks and benefits of image guided port-a-catheter placement was discussed with the patient including, but not limited to bleeding, infection, pneumothorax, or fibrin sheath development and need for additional procedures.  All of the patient's questions were answered, patient is agreeable to proceed. Consent signed and in chart.    Thank you for allowing our service to participate in DRAYCE TAWIL 's care.  Electronically Signed: D. Franky Rakers, PA-C   11/23/2023, 1:24 PM      I spent a total of  20 minutes   in face to face in clinical consultation, greater than 50% of which was counseling/coordinating care for Port-A-Cath placement

## 2023-11-24 ENCOUNTER — Ambulatory Visit (HOSPITAL_COMMUNITY)
Admission: RE | Admit: 2023-11-24 | Discharge: 2023-11-24 | Disposition: A | Discharge status: Home / Self Care | Source: Ambulatory Visit

## 2023-11-24 ENCOUNTER — Other Ambulatory Visit: Payer: Self-pay

## 2023-11-24 ENCOUNTER — Encounter (HOSPITAL_COMMUNITY): Payer: Self-pay

## 2023-11-24 DIAGNOSIS — G4730 Sleep apnea, unspecified: Secondary | ICD-10-CM | POA: Diagnosis not present

## 2023-11-24 DIAGNOSIS — K219 Gastro-esophageal reflux disease without esophagitis: Secondary | ICD-10-CM | POA: Insufficient documentation

## 2023-11-24 DIAGNOSIS — N400 Enlarged prostate without lower urinary tract symptoms: Secondary | ICD-10-CM | POA: Insufficient documentation

## 2023-11-24 DIAGNOSIS — I129 Hypertensive chronic kidney disease with stage 1 through stage 4 chronic kidney disease, or unspecified chronic kidney disease: Secondary | ICD-10-CM | POA: Insufficient documentation

## 2023-11-24 DIAGNOSIS — N189 Chronic kidney disease, unspecified: Secondary | ICD-10-CM | POA: Diagnosis not present

## 2023-11-24 DIAGNOSIS — Z01818 Encounter for other preprocedural examination: Secondary | ICD-10-CM

## 2023-11-24 DIAGNOSIS — C9141 Hairy cell leukemia, in remission: Secondary | ICD-10-CM | POA: Insufficient documentation

## 2023-11-24 DIAGNOSIS — C679 Malignant neoplasm of bladder, unspecified: Secondary | ICD-10-CM | POA: Diagnosis present

## 2023-11-24 DIAGNOSIS — Z87891 Personal history of nicotine dependence: Secondary | ICD-10-CM | POA: Insufficient documentation

## 2023-11-24 DIAGNOSIS — K589 Irritable bowel syndrome without diarrhea: Secondary | ICD-10-CM | POA: Diagnosis not present

## 2023-11-24 HISTORY — PX: IR IMAGING GUIDED PORT INSERTION: IMG5740

## 2023-11-24 MED ORDER — MIDAZOLAM HCL 2 MG/2ML IJ SOLN
INTRAMUSCULAR | Status: AC
  Filled 2023-11-24: qty 2

## 2023-11-24 MED ORDER — HEPARIN SOD (PORK) LOCK FLUSH 100 UNIT/ML IV SOLN
500.0000 [IU] | Freq: Once | INTRAVENOUS | Status: AC
  Administered 2023-11-24: 500 [IU] via INTRAVENOUS

## 2023-11-24 MED ORDER — FENTANYL CITRATE (PF) 100 MCG/2ML IJ SOLN
INTRAMUSCULAR | Status: AC | PRN
  Administered 2023-11-24: 50 ug via INTRAVENOUS

## 2023-11-24 MED ORDER — HEPARIN SOD (PORK) LOCK FLUSH 100 UNIT/ML IV SOLN
INTRAVENOUS | Status: AC
  Filled 2023-11-24: qty 5

## 2023-11-24 MED ORDER — LIDOCAINE-EPINEPHRINE 1 %-1:100000 IJ SOLN
INTRAMUSCULAR | Status: AC
  Filled 2023-11-24: qty 1

## 2023-11-24 MED ORDER — LIDOCAINE-EPINEPHRINE 1 %-1:100000 IJ SOLN
20.0000 mL | Freq: Once | INTRAMUSCULAR | Status: AC
  Administered 2023-11-24: 20 mL via INTRADERMAL

## 2023-11-24 MED ORDER — FENTANYL CITRATE (PF) 100 MCG/2ML IJ SOLN
INTRAMUSCULAR | Status: AC
  Filled 2023-11-24: qty 2

## 2023-11-24 MED ORDER — MIDAZOLAM HCL 2 MG/2ML IJ SOLN
INTRAMUSCULAR | Status: AC | PRN
  Administered 2023-11-24: 1 mg via INTRAVENOUS

## 2023-11-24 NOTE — Procedures (Signed)
 Interventional Radiology Procedure Note  Procedure: Single Lumen Power Port Placement    Access:  Right internal jugular vein  Findings: Catheter tip positioned at cavoatrial junction. Port is ready for immediate use.   Complications: None  EBL: < 10 mL  Recommendations:  - Ok to shower in 24 hours - Do not submerge for 7 days - Routine line care    Marliss Coots, MD

## 2023-11-24 NOTE — Discharge Instructions (Signed)
 Please call Interventional Radiology clinic [phone] with any questions or concerns.  You may remove your dressing and shower tomorrow.  After the procedure, it is common to have: Discomfort at the port insertion site. Bruising on the skin over the port. This should improve over 3-4 days  Follow these instructions at home:  Medication: Do not use Aspirin or ibuprofen products, such as Advil or Motrin, as it may increase bleeding.  You may resume your usual medications as ordered by your doctor. If your doctor prescribed antibiotics, take them as directed. Do not stop taking them just because you feel better. You need to take the full course of antibiotics.  Eating and drinking: Drink plenty of liquids to keep your urine pale yellow You can resume your regular diet as directed by your doctor   Care of the procedure site Follow instructions from your health care provider about how to take care of your port insertion site. Make sure you: After your port is placed, you will get a manufacturer's information card. The card has information about your port. Keep this card with you at all times Make sure to remember what type of port you have Take care of the port as told by your health care provider DO NOT use EMLA cream for 2 weeks after port placement -the cream will remove the surgical glue on your incision DO NOT use any lotions, creams, or ointments on incision for 2 weeks. This will remove the surgical glue on your incision Wash your hands with soap and water before and after you change your bandage (dressing). If soap and water are not available, use hand sanitizer Change your dressing as told by your health care provider Leave skin glue, or adhesive strips in place. These skin closures may need to stay in place for 2 weeks or longer Check your port insertion site every day for signs of infection. Check for: Redness, swelling, or pain Fluid or blood Warmth Pus or a bad  smell  Activity Return to your normal activities as told by your health care provider. Ask your health care provider what activities are safe for you Do not lift anything that is heavier than 10 lb (4.5 kg), or the limit that you are told, until your health care provider says that it is safe Do not take baths, swim, or use a hot tub until your health care provider approves. Take showers only. Keep all follow-up visits as told by your doctor  Contact a health care provider if: You cannot flush your port with saline as directed, or you cannot draw blood from the port You have a fever or chills You have redness, swelling, or pain around your port insertion site You have fluid or blood coming from your port insertion site Your port insertion site feels warm to the touch You have pus or a bad smell coming from the port insertion site  Get help right away if: You have chest pain or shortness of breath You have bleeding from your port that you cannot control

## 2023-11-25 NOTE — Progress Notes (Signed)
 Pharmacist Chemotherapy Monitoring - Initial Assessment    Anticipated start date: 12/02/23   The following has been reviewed per standard work regarding the patient's treatment regimen: The patient's diagnosis, treatment plan and drug doses, and organ/hematologic function Lab orders and baseline tests specific to treatment regimen  The treatment plan start date, drug sequencing, and pre-medications Prior authorization status  Patient's documented medication list, including drug-drug interaction screen and prescriptions for anti-emetics and supportive care specific to the treatment regimen The drug concentrations, fluid compatibility, administration routes, and timing of the medications to be used The patient's access for treatment and lifetime cumulative dose history, if applicable  The patient's medication allergies and previous infusion related reactions, if applicable   Changes made to treatment plan:  N/A  Follow up needed:  Pending authorization for treatment    Cameron Wu, PharmD, MBA

## 2023-11-29 ENCOUNTER — Ambulatory Visit: Admitting: Urology

## 2023-11-29 ENCOUNTER — Other Ambulatory Visit

## 2023-11-29 ENCOUNTER — Ambulatory Visit

## 2023-12-02 ENCOUNTER — Other Ambulatory Visit

## 2023-12-02 ENCOUNTER — Inpatient Hospital Stay

## 2023-12-02 VITALS — BP 143/80 | HR 65 | Temp 97.9°F | Resp 16 | Ht 72.0 in | Wt 165.0 lb

## 2023-12-02 DIAGNOSIS — C678 Malignant neoplasm of overlapping sites of bladder: Secondary | ICD-10-CM

## 2023-12-02 DIAGNOSIS — Z5112 Encounter for antineoplastic immunotherapy: Secondary | ICD-10-CM | POA: Diagnosis not present

## 2023-12-02 DIAGNOSIS — R634 Abnormal weight loss: Secondary | ICD-10-CM

## 2023-12-02 DIAGNOSIS — Z95828 Presence of other vascular implants and grafts: Secondary | ICD-10-CM | POA: Insufficient documentation

## 2023-12-02 LAB — CBC WITH DIFFERENTIAL (CANCER CENTER ONLY)
Abs Immature Granulocytes: 0 K/uL (ref 0.00–0.07)
Basophils Absolute: 0 K/uL (ref 0.0–0.1)
Basophils Relative: 0 %
Eosinophils Absolute: 0 K/uL (ref 0.0–0.5)
Eosinophils Relative: 1 %
HCT: 32.6 % — ABNORMAL LOW (ref 39.0–52.0)
Hemoglobin: 11.1 g/dL — ABNORMAL LOW (ref 13.0–17.0)
Immature Granulocytes: 0 %
Lymphocytes Relative: 20 %
Lymphs Abs: 1 K/uL (ref 0.7–4.0)
MCH: 32.1 pg (ref 26.0–34.0)
MCHC: 34 g/dL (ref 30.0–36.0)
MCV: 94.2 fL (ref 80.0–100.0)
Monocytes Absolute: 0.3 K/uL (ref 0.1–1.0)
Monocytes Relative: 7 %
Neutro Abs: 3.5 K/uL (ref 1.7–7.7)
Neutrophils Relative %: 72 %
Platelet Count: 158 K/uL (ref 150–400)
RBC: 3.46 MIL/uL — ABNORMAL LOW (ref 4.22–5.81)
RDW: 13.7 % (ref 11.5–15.5)
WBC Count: 4.8 K/uL (ref 4.0–10.5)
nRBC: 0 % (ref 0.0–0.2)

## 2023-12-02 LAB — CMP (CANCER CENTER ONLY)
ALT: 14 U/L (ref 0–44)
AST: 18 U/L (ref 15–41)
Albumin: 4.1 g/dL (ref 3.5–5.0)
Alkaline Phosphatase: 87 U/L (ref 38–126)
Anion gap: 5 (ref 5–15)
BUN: 15 mg/dL (ref 8–23)
CO2: 30 mmol/L (ref 22–32)
Calcium: 9.1 mg/dL (ref 8.9–10.3)
Chloride: 105 mmol/L (ref 98–111)
Creatinine: 1.34 mg/dL — ABNORMAL HIGH (ref 0.61–1.24)
GFR, Estimated: 56 mL/min — ABNORMAL LOW (ref >60)
Glucose, Bld: 105 mg/dL — ABNORMAL HIGH (ref 70–99)
Potassium: 3.8 mmol/L (ref 3.5–5.1)
Sodium: 140 mmol/L (ref 135–145)
Total Bilirubin: 0.6 mg/dL (ref 0.0–1.2)
Total Protein: 6.7 g/dL (ref 6.5–8.1)

## 2023-12-02 LAB — TSH: TSH: 1.05 u[IU]/mL (ref 0.350–4.500)

## 2023-12-02 MED ORDER — SODIUM CHLORIDE 0.9 % IV SOLN
200.0000 mg | Freq: Once | INTRAVENOUS | Status: AC
  Administered 2023-12-02: 200 mg via INTRAVENOUS
  Filled 2023-12-02: qty 200

## 2023-12-02 MED ORDER — SODIUM CHLORIDE 0.9 % IV SOLN
INTRAVENOUS | Status: DC
Start: 2023-12-02 — End: 2023-12-02

## 2023-12-02 MED ORDER — SODIUM CHLORIDE 0.9% FLUSH
10.0000 mL | Freq: Once | INTRAVENOUS | Status: AC
  Administered 2023-12-02: 10 mL

## 2023-12-02 MED ORDER — SODIUM CHLORIDE 0.9 % IV SOLN
1.2500 mg/kg | Freq: Once | INTRAVENOUS | Status: AC
  Administered 2023-12-02: 100 mg via INTRAVENOUS
  Filled 2023-12-02: qty 6

## 2023-12-02 MED ORDER — PROCHLORPERAZINE MALEATE 10 MG PO TABS
10.0000 mg | ORAL_TABLET | Freq: Once | ORAL | Status: AC
  Administered 2023-12-02: 10 mg via ORAL
  Filled 2023-12-02: qty 1

## 2023-12-02 NOTE — Patient Instructions (Addendum)
 [facility] - A DEPT OF [facility]  Discharge Instructions: Thank you for choosing [facility] to provide your oncology and hematology care.   If you have a lab appointment with the Cancer Center, please go directly to the Cancer Center and check in at the registration area.   Wear comfortable clothing and clothing appropriate for easy access to any Portacath or PICC line.   We strive to give you quality time with your provider. You may need to reschedule your appointment if you arrive late (15 or more minutes).  Arriving late affects you and other patients whose appointments are after yours.  Also, if you miss three or more appointments without notifying the office, you may be dismissed from the clinic at the provider's discretion.      For prescription refill requests, have your pharmacy contact our office and allow 72 hours for refills to be completed.    Today you received the following chemotherapy and/or immunotherapy agents padcev , keytruda       To help prevent nausea and vomiting after your treatment, we encourage you to take your nausea medication as directed.  BELOW ARE SYMPTOMS THAT SHOULD BE REPORTED IMMEDIATELY: *FEVER GREATER THAN 100.4 F (38 C) OR HIGHER *CHILLS OR SWEATING *NAUSEA AND VOMITING THAT IS NOT CONTROLLED WITH YOUR NAUSEA MEDICATION *UNUSUAL SHORTNESS OF BREATH *UNUSUAL BRUISING OR BLEEDING *URINARY PROBLEMS (pain or burning when urinating, or frequent urination) *BOWEL PROBLEMS (unusual diarrhea, constipation, pain near the anus) TENDERNESS IN MOUTH AND THROAT WITH OR WITHOUT PRESENCE OF ULCERS (sore throat, sores in mouth, or a toothache) UNUSUAL RASH, SWELLING OR PAIN  UNUSUAL VAGINAL DISCHARGE OR ITCHING   Items with * indicate a potential emergency and should be followed up as soon as possible or go to the Emergency Department if any problems should occur.  Please show the CHEMOTHERAPY ALERT CARD or  IMMUNOTHERAPY ALERT CARD at check-in to the Emergency Department and triage nurse.  Should you have questions after your visit or need to cancel or reschedule your appointment, please contact [facility] - A DEPT OF [facility]  Dept: [phone]  and follow the prompts.  Office hours are 8:00 a.m. to 4:30 p.m. Monday - Friday. Please note that voicemails left after 4:00 p.m. may not be returned until the following business day.  We are closed weekends and major holidays. You have access to a nurse at all times for urgent questions. Please call the main number to the clinic Dept: [phone] and follow the prompts.   For any non-urgent questions, you may also contact your provider using MyChart. We now offer e-Visits for anyone 65 and older to request care online for non-urgent symptoms. For details visit [email or web address].   Also download the MyChart app! Go to the app store, search MyChart, open the app, select [place], and log in with your MyChart username and password.  Enfortumab Vedotin  Injection What is this medication? ENFORTUMAB VEDOTIN  (en FORT ue mab ve DOE tin) treats bladder cancer and kidney cancer. It works by blocking a protein that causes cancer cells to grow and multiply. This helps to slow or stop the spread of cancer cells. This medicine may be used for other purposes; ask your health care provider or pharmacist if you have questions. COMMON BRAND NAME(S): PADCEV  What should I tell my care team before I take this medication? They need to know if you have any of  these conditions: Diabetes Eye disease Liver disease Lung disease Tingling of the fingers or toes or other nerve disorder Vision problems An unusual or allergic reaction to enfortumab vedotin , other medications, foods, dyes, or preservatives Pregnant or trying to get pregnant Breast-feeding How should I use this medication? This medication is injected into a vein. It  is given by your care team in a hospital or clinic setting. Talk to your care team about the use of this medication in children. Special care may be needed. Overdosage: If you think you have taken too much of this medicine contact a poison control center or emergency room at once. NOTE: This medicine is only for you. Do not share this medicine with others. What if I miss a dose? Keep appointments for follow-up doses. It is important not to miss your dose. Call your care team if you are unable to keep an appointment. What may interact with this medication? This medication may affect how other medications work, and other medications may affect how this medication works. Talk with your care team about all of the medications you take. They may suggest changes to your treatment plan to lower the risk of side effects and to make sure your medications work as intended. This list may not describe all possible interactions. Give your health care provider a list of all the medicines, herbs, non-prescription drugs, or dietary supplements you use. Also tell them if you smoke, drink alcohol, or use illegal drugs. Some items may interact with your medicine. What should I watch for while using this medication? Your condition will be monitored carefully while you are receiving this medication. This medication may make you feel generally unwell. This is not uncommon as chemotherapy can affect healthy cells as well as cancer cells. Report any side effects. Continue your course of treatment even though you feel ill unless your care team tells you to stop. This medication may increase blood sugar. The risk may be higher in patients who already have diabetes. Ask your care team what you can do to lower your risk of diabetes while taking this medication. This medication can cause a serious condition in which there is too much acid in your blood. If you develop nausea, vomiting, stomach pain, unusual tiredness, or trouble  breathing, stop taking this medication and call your care team right away. If possible, use a ketone dipstick to check for ketones in your urine. This medication may cause dry eyes and blurred vision. If you wear contact lenses, you may feel some discomfort. Lubricating eye drops may help. See your care team if the problem does not go away or is severe. Tell your care team right away if you have any change in your eyesight. This medication may increase your risk of getting an infection. Call your care team for advice if you get a fever, chills, sore throat, or other symptoms of a cold or flu. Do not treat yourself. Try to avoid being around people who are sick. Avoid taking medications that contain aspirin , acetaminophen , ibuprofen, naproxen, or ketoprofen unless instructed by your care team. These medications may hide a fever. This medication may cause serious skin reactions. They can happen weeks to months after starting the medication. Contact your care team right away if you notice fevers or flu-like symptoms with a rash. The rash may be red or purple and then turn into blisters or peeling of the skin. You may also notice a red rash with swelling of the face, lips or lymph nodes  in your neck or under your arms. Talk to your care team if you or your partner may be pregnant. Serious birth defects can occur if you take this medication during pregnancy and for 2 months after the last dose. You will need a negative pregnancy test before starting this medication. Contraception is recommended while taking this medication and for 2 months after the last dose. Your care team can help you find the option that works for you. If your partner can get pregnant, use a condom during sex while taking this medication and for 4 months after the last dose. Do not breastfeed while taking this medicine or for at least 3 weeks after the last dose. This medication may cause infertility. Talk to your care team if you are  concerned about your fertility. What side effects may I notice from receiving this medication? Side effects that you should report to your care team as soon as possible: Allergic reactions--skin rash, itching, hives, swelling of the face, lips, tongue, or throat Dry cough, shortness of breath or trouble breathing Eye pain, redness, irritation, or discharge with blurry or decreased vision High blood sugar (hyperglycemia)--increased thirst or amount of urine, unusual weakness or fatigue, blurry vision Painful swelling, warmth, or redness of the skin, blisters or sores at the infusion site Pain, tingling, or numbness in the hands or feet Redness, blistering, peeling, or loosening of the skin, including inside the mouth Unusual bruising or bleeding Side effects that usually do not require medical attention (report these to your care team if they continue or are bothersome): Change in taste Diarrhea Dry eyes Fatigue Hair loss Loss of appetite This list may not describe all possible side effects. Call your doctor for medical advice about side effects. You may report side effects to FDA at 1-800-FDA-1088. Where should I keep my medication? This medication is given in a hospital or clinic. It will not be stored at home. NOTE: This sheet is a summary. It may not cover all possible information. If you have questions about this medicine, talk to your doctor, pharmacist, or health care provider.  2024 Elsevier/Gold Standard (2021-08-18 00:00:00)

## 2023-12-03 ENCOUNTER — Ambulatory Visit
Admission: RE | Admit: 2023-12-03 | Discharge: 2023-12-03 | Disposition: A | Discharge status: Home / Self Care | Source: Ambulatory Visit | Attending: Urology | Admitting: Urology

## 2023-12-03 DIAGNOSIS — D49511 Neoplasm of unspecified behavior of right kidney: Secondary | ICD-10-CM

## 2023-12-03 LAB — T4: T4, Total: 9.8 ug/dL (ref 4.5–12.0)

## 2023-12-03 MED ORDER — GADOPICLENOL 0.5 MMOL/ML IV SOLN
8.0000 mL | Freq: Once | INTRAVENOUS | Status: AC | PRN
  Administered 2023-12-03: 8 mL via INTRAVENOUS

## 2023-12-05 ENCOUNTER — Telehealth: Payer: Self-pay

## 2023-12-05 NOTE — Telephone Encounter (Signed)
 Cameron Wu states that he is doing pretty well. He is drinking and urinating well. Reviewed taking in at least 64 ounces a day of caffeine free fluid a day. Pt may be getting in ~40 oz a day. Since his treatment 12-02-22 his appetite is gone. He is taking in an ensure twice a day. Eats a little bit of meals and then is full. Discussed trying small meals every 2-3 hours with a protein. Pt stated that he will try this recommendation. He knows to call the office at [phone] if he has any questions or concerns.

## 2023-12-05 NOTE — Telephone Encounter (Signed)
-----   Message from Nurse Kerri GAILS sent at 12/02/2023  2:27 PM EDT ----- Regarding: FT chemo Chang Pt completed treatment without incident

## 2023-12-08 DIAGNOSIS — R630 Anorexia: Secondary | ICD-10-CM | POA: Insufficient documentation

## 2023-12-08 NOTE — Assessment & Plan Note (Addendum)
 Toxicities being monitored in relation to EV Monitor for skin toxicity Prophylactic Desitin twice daily to intertriginous, flexural, and acral areas Apply emollient moisturizer twice a day Use sunscreen if outside for prolonged period of time. Hydration 64+ ounces a day Avoid hot showers Monitor for neuropathy Monitor for hyperglycemia.  Baseline A1c Monitor for signs of pneumonitis Warning signs: Malaise.  Fever of more than 100.4.  Mucosal involvement in the ocular, oral or genital area.  Skin pain, burning, numbness tingling. Monitor for diarrhea and other GI symptoms

## 2023-12-08 NOTE — Assessment & Plan Note (Addendum)
 Marinol 5 mg twice daily

## 2023-12-08 NOTE — Assessment & Plan Note (Addendum)
 Continue day 8 Follow up on cycle 2

## 2023-12-08 NOTE — Progress Notes (Addendum)
 [facility] OFFICE PROGRESS NOTE  Patient Care Team: [name], MD as PCP - General (Internal Medicine)  Cameron Wu is a 73 y.o.male with history of NMIBC, Hairy cell leukemia, BPH, IBS being seen at [facility] for [facility] UC of renal pelvis. Referred by Dr. Donnice Siad.   Pathology reviewed from 6/13. RIGHT RENAL PELVIS MASS, BIOPSY:  Detached small fragment of high grade urothelial carcinoma.  Started EVP and plan for 4 cycles and repeat imaging.   Report nausea after treatment and not eating much.  This resolved after few days.  We will add Compazine  twice daily preemptively for first 5 days. Assessment & Plan Malignant neoplasm of urinary bladder, unspecified site (HCC) Continue day 8 Follow up on cycle 2 At risk for side effect of medication Toxicities being monitored in relation to EV Monitor for skin toxicity Prophylactic Desitin twice daily to intertriginous, flexural, and acral areas Apply emollient moisturizer twice a day Use sunscreen if outside for prolonged period of time. Hydration 64+ ounces a day Avoid hot showers Monitor for neuropathy Monitor for hyperglycemia.  Baseline A1c Monitor for signs of pneumonitis Warning signs: Malaise.  Fever of more than 100.4.  Mucosal involvement in the ocular, oral or genital area.  Skin pain, burning, numbness tingling. Monitor for diarrhea and other GI symptoms Decreased appetite Improved after few days Will use compazine  twice daily on first 5 days during week of treatment Overactive bladder Refilled oxybutynin . It helps him nightly Other constipation Use Miralax  or colace as needed. Increase fiber. Port-A-Cath in place Watch for infection symptoms and signs. Malignant neoplasm of overlapping sites of bladder (HCC) Continue day 8 Follow up on cycle 2  Return as scheduled.   Cameron JAYSON Chihuahua, MD  INTERVAL HISTORY: Patient returns for follow-up. Report having nausea Rezaian not eating much for  first few days.  This has resolved.  Wife report he is eating well as of yesterday.  Some constipation.  No fever, abdominal pain.  No rash, no neuropathy.  No fever or shortness of breath.  Oncology History  Bladder cancer (HCC)  03/26/2021 Initial Diagnosis   Bladder cancer (HCC)   12/02/2023 -  Chemotherapy   Patient is on Treatment Plan : UROTHELIAL ADVANCED, METASTATIC ENFORTUMAB D1, D8 + PEMBROLIZUMAB  (200) D1 Q21D     12/08/2023 Cancer Staging   Staging form: Urinary Bladder, AJCC 8th Edition - Clinical: Stage IIIB (cT3, cN2, cM0) - Signed by Wu Cameron JAYSON, MD on 12/08/2023 Stage prefix: Initial diagnosis WHO/ISUP grade (low/high): High Grade Histologic grading system: 2 grade system      PHYSICAL EXAMINATION: ECOG PERFORMANCE STATUS: 1 - Symptomatic but completely ambulatory  Vitals:   12/09/23 1140  BP: 138/63  Pulse: 79  Temp: 97.8 F (36.6 C)  SpO2: 99%   Filed Weights   12/09/23 1140  Weight: 166 lb 4.8 oz (75.4 kg)    GENERAL: alert, no distress and comfortable SKIN: skin color normal and no rash on exposed skin LUNGS: clear to auscultation and percussion with normal breathing effort HEART: regular rate & rhythm  ABDOMEN: abdomen soft, non-tender and nondistended. Musculoskeletal: no edema   Relevant data reviewed during this visit included labs.

## 2023-12-09 ENCOUNTER — Inpatient Hospital Stay

## 2023-12-09 ENCOUNTER — Inpatient Hospital Stay (HOSPITAL_BASED_OUTPATIENT_CLINIC_OR_DEPARTMENT_OTHER)

## 2023-12-09 ENCOUNTER — Other Ambulatory Visit: Payer: Self-pay

## 2023-12-09 VITALS — BP 138/63 | HR 79 | Temp 97.8°F | Wt 166.3 lb

## 2023-12-09 DIAGNOSIS — Z95828 Presence of other vascular implants and grafts: Secondary | ICD-10-CM

## 2023-12-09 DIAGNOSIS — N3281 Overactive bladder: Secondary | ICD-10-CM

## 2023-12-09 DIAGNOSIS — C679 Malignant neoplasm of bladder, unspecified: Secondary | ICD-10-CM | POA: Diagnosis not present

## 2023-12-09 DIAGNOSIS — Z9189 Other specified personal risk factors, not elsewhere classified: Secondary | ICD-10-CM | POA: Diagnosis not present

## 2023-12-09 DIAGNOSIS — Z5112 Encounter for antineoplastic immunotherapy: Secondary | ICD-10-CM | POA: Diagnosis not present

## 2023-12-09 DIAGNOSIS — R630 Anorexia: Secondary | ICD-10-CM | POA: Diagnosis not present

## 2023-12-09 DIAGNOSIS — C678 Malignant neoplasm of overlapping sites of bladder: Secondary | ICD-10-CM

## 2023-12-09 DIAGNOSIS — K5900 Constipation, unspecified: Secondary | ICD-10-CM | POA: Insufficient documentation

## 2023-12-09 DIAGNOSIS — K5909 Other constipation: Secondary | ICD-10-CM

## 2023-12-09 LAB — CMP (CANCER CENTER ONLY)
ALT: 21 U/L (ref 0–44)
AST: 29 U/L (ref 15–41)
Albumin: 4 g/dL (ref 3.5–5.0)
Alkaline Phosphatase: 84 U/L (ref 38–126)
Anion gap: 3 — ABNORMAL LOW (ref 5–15)
BUN: 18 mg/dL (ref 8–23)
CO2: 32 mmol/L (ref 22–32)
Calcium: 9.2 mg/dL (ref 8.9–10.3)
Chloride: 104 mmol/L (ref 98–111)
Creatinine: 1.16 mg/dL (ref 0.61–1.24)
GFR, Estimated: >60 mL/min (ref >60)
Glucose, Bld: 91 mg/dL (ref 70–99)
Potassium: 4 mmol/L (ref 3.5–5.1)
Sodium: 139 mmol/L (ref 135–145)
Total Bilirubin: 0.6 mg/dL (ref 0.0–1.2)
Total Protein: 6.7 g/dL (ref 6.5–8.1)

## 2023-12-09 LAB — CBC WITH DIFFERENTIAL (CANCER CENTER ONLY)
Abs Immature Granulocytes: 0.02 K/uL (ref 0.00–0.07)
Basophils Absolute: 0 K/uL (ref 0.0–0.1)
Basophils Relative: 0 %
Eosinophils Absolute: 0.1 K/uL (ref 0.0–0.5)
Eosinophils Relative: 3 %
HCT: 32 % — ABNORMAL LOW (ref 39.0–52.0)
Hemoglobin: 10.7 g/dL — ABNORMAL LOW (ref 13.0–17.0)
Immature Granulocytes: 1 %
Lymphocytes Relative: 20 %
Lymphs Abs: 0.7 K/uL (ref 0.7–4.0)
MCH: 31.6 pg (ref 26.0–34.0)
MCHC: 33.4 g/dL (ref 30.0–36.0)
MCV: 94.4 fL (ref 80.0–100.0)
Monocytes Absolute: 0.4 K/uL (ref 0.1–1.0)
Monocytes Relative: 10 %
Neutro Abs: 2.3 K/uL (ref 1.7–7.7)
Neutrophils Relative %: 66 %
Platelet Count: 128 K/uL — ABNORMAL LOW (ref 150–400)
RBC: 3.39 MIL/uL — ABNORMAL LOW (ref 4.22–5.81)
RDW: 13.4 % (ref 11.5–15.5)
WBC Count: 3.5 K/uL — ABNORMAL LOW (ref 4.0–10.5)
nRBC: 0 % (ref 0.0–0.2)

## 2023-12-09 MED ORDER — SODIUM CHLORIDE 0.9 % IV SOLN
1.2500 mg/kg | Freq: Once | INTRAVENOUS | Status: AC
  Administered 2023-12-09: 100 mg via INTRAVENOUS
  Filled 2023-12-09: qty 6

## 2023-12-09 MED ORDER — OXYBUTYNIN CHLORIDE 5 MG PO TABS: 5.0000 mg | ORAL_TABLET | Freq: Every evening | ORAL | 0 refills | Status: DC

## 2023-12-09 MED ORDER — SODIUM CHLORIDE 0.9 % IV SOLN: INTRAVENOUS | Status: DC

## 2023-12-09 MED ORDER — SODIUM CHLORIDE 0.9% FLUSH
10.0000 mL | Freq: Once | INTRAVENOUS | Status: AC
  Administered 2023-12-09: 10 mL

## 2023-12-09 MED ORDER — PROCHLORPERAZINE MALEATE 10 MG PO TABS
10.0000 mg | ORAL_TABLET | Freq: Once | ORAL | Status: AC
  Administered 2023-12-09: 10 mg via ORAL
  Filled 2023-12-09: qty 1

## 2023-12-09 NOTE — Assessment & Plan Note (Signed)
 Continue day 8 Follow up on cycle 2

## 2023-12-09 NOTE — Patient Instructions (Signed)
 [facility] - A DEPT OF [facility]  Discharge Instructions: Thank you for choosing [facility] to provide your oncology and hematology care.   If you have a lab appointment with the Cancer Center, please go directly to the Cancer Center and check in at the registration area.   Wear comfortable clothing and clothing appropriate for easy access to any Portacath or PICC line.   We strive to give you quality time with your provider. You may need to reschedule your appointment if you arrive late (15 or more minutes).  Arriving late affects you and other patients whose appointments are after yours.  Also, if you miss three or more appointments without notifying the office, you may be dismissed from the clinic at the provider's discretion.      For prescription refill requests, have your pharmacy contact our office and allow 72 hours for refills to be completed.    Today you received the following chemotherapy and/or immunotherapy agents: enfortumab vedotin -ejfv      To help prevent nausea and vomiting after your treatment, we encourage you to take your nausea medication as directed.  BELOW ARE SYMPTOMS THAT SHOULD BE REPORTED IMMEDIATELY: *FEVER GREATER THAN 100.4 F (38 C) OR HIGHER *CHILLS OR SWEATING *NAUSEA AND VOMITING THAT IS NOT CONTROLLED WITH YOUR NAUSEA MEDICATION *UNUSUAL SHORTNESS OF BREATH *UNUSUAL BRUISING OR BLEEDING *URINARY PROBLEMS (pain or burning when urinating, or frequent urination) *BOWEL PROBLEMS (unusual diarrhea, constipation, pain near the anus) TENDERNESS IN MOUTH AND THROAT WITH OR WITHOUT PRESENCE OF ULCERS (sore throat, sores in mouth, or a toothache) UNUSUAL RASH, SWELLING OR PAIN  UNUSUAL VAGINAL DISCHARGE OR ITCHING   Items with * indicate a potential emergency and should be followed up as soon as possible or go to the Emergency Department if any problems should occur.  Please show the CHEMOTHERAPY ALERT CARD or  IMMUNOTHERAPY ALERT CARD at check-in to the Emergency Department and triage nurse.  Should you have questions after your visit or need to cancel or reschedule your appointment, please contact [facility] - A DEPT OF [facility]  Dept: [phone]  and follow the prompts.  Office hours are 8:00 a.m. to 4:30 p.m. Monday - Friday. Please note that voicemails left after 4:00 p.m. may not be returned until the following business day.  We are closed weekends and major holidays. You have access to a nurse at all times for urgent questions. Please call the main number to the clinic Dept: [phone] and follow the prompts.   For any non-urgent questions, you may also contact your provider using MyChart. We now offer e-Visits for anyone 72 and older to request care online for non-urgent symptoms. For details visit [email or web address].   Also download the MyChart app! Go to the app store, search MyChart, open the app, select [place], and log in with your MyChart username and password.

## 2023-12-09 NOTE — Assessment & Plan Note (Addendum)
 Watch for infection symptoms and signs.

## 2023-12-09 NOTE — Assessment & Plan Note (Addendum)
 Use Miralax  or colace as needed. Increase fiber.

## 2023-12-20 ENCOUNTER — Inpatient Hospital Stay: Attending: Hematology

## 2023-12-20 DIAGNOSIS — Z7952 Long term (current) use of systemic steroids: Secondary | ICD-10-CM | POA: Insufficient documentation

## 2023-12-20 DIAGNOSIS — C651 Malignant neoplasm of right renal pelvis: Secondary | ICD-10-CM | POA: Diagnosis present

## 2023-12-20 DIAGNOSIS — Z87891 Personal history of nicotine dependence: Secondary | ICD-10-CM | POA: Diagnosis not present

## 2023-12-20 DIAGNOSIS — R5382 Chronic fatigue, unspecified: Secondary | ICD-10-CM | POA: Insufficient documentation

## 2023-12-20 DIAGNOSIS — Z5112 Encounter for antineoplastic immunotherapy: Secondary | ICD-10-CM | POA: Insufficient documentation

## 2023-12-20 DIAGNOSIS — C9141 Hairy cell leukemia, in remission: Secondary | ICD-10-CM | POA: Insufficient documentation

## 2023-12-20 DIAGNOSIS — L270 Generalized skin eruption due to drugs and medicaments taken internally: Secondary | ICD-10-CM | POA: Diagnosis not present

## 2023-12-20 MED ORDER — TRIAMCINOLONE ACETONIDE 0.5 % EX OINT: 1.0000 | TOPICAL_OINTMENT | Freq: Two times a day (BID) | CUTANEOUS | 0 refills | Status: DC

## 2023-12-20 MED ORDER — METHYLPREDNISOLONE 4 MG PO TBPK: ORAL_TABLET | ORAL | 0 refills | Status: DC

## 2023-12-20 NOTE — Progress Notes (Signed)
 [facility] OFFICE PROGRESS NOTE  Patient Care Team: [name], MD as PCP - General (Internal Medicine)  Cameron Wu is a 73 y.o.male with history of NMIBC, Hairy cell leukemia, BPH, IBS being seen at [facility] for [facility] UC of renal pelvis. Referred by Dr. Donnice Siad.   Pathology reviewed from 6/13. RIGHT RENAL PELVIS MASS, BIOPSY:  Detached small fragment of high grade urothelial carcinoma.   Started EVP and plan for 4 cycles and repeat imaging.   Developed diffuse rash over the body. No oral lesion. Will start steroid. Assessment & Plan Drug-induced skin rash Medrol  dosepak Triamcinolone  twice dialy Return later this week Likely postpone for one week  Orders Placed This Encounter  Procedures   CBC with Differential (Cancer Center Only)    Standing Status:   Future    Expiration Date:   12/19/2024   CMP (Cancer Center only)    Standing Status:   Future    Expiration Date:   12/19/2024   C-reactive protein    Standing Status:   Future    Expiration Date:   12/19/2024     Cameron JAYSON Chihuahua, MD  INTERVAL HISTORY: Patient returns for follow-up. He developed rash after day 8. Diffuse, dry and itchy. No oral lesion. Rashes are not improving over the weekend. No fever.  Oncology History  Bladder cancer (HCC)  03/26/2021 Initial Diagnosis   Bladder cancer (HCC)   12/02/2023 -  Chemotherapy   Patient is on Treatment Plan : UROTHELIAL ADVANCED, METASTATIC ENFORTUMAB D1, D8 + PEMBROLIZUMAB  (200) D1 Q21D     12/08/2023 Cancer Staging   Staging form: Urinary Bladder, AJCC 8th Edition - Clinical: Stage IIIB (cT3, cN2, cM0) - Signed by Wu Cameron JAYSON, MD on 12/08/2023 Stage prefix: Initial diagnosis WHO/ISUP grade (low/high): High Grade Histologic grading system: 2 grade system      PHYSICAL EXAMINATION: ECOG PERFORMANCE STATUS: 2 - Symptomatic, <50% confined to bed  VSS  GENERAL: alert, no distress and comfortable SKIN: skin color normal and rashes over  trunk, extremities. In media EYES: sclera clear OROPHARYNX: no lesions or ulcers  LUNGS: clear, normal breathing effort

## 2023-12-20 NOTE — Telephone Encounter (Signed)
 Called patient to see if he can come in today. He will be here at 1:00 pm.

## 2023-12-22 NOTE — Progress Notes (Unsigned)
 [facility] OFFICE PROGRESS NOTE  Patient Care Team: [name], MD as PCP - General (Internal Medicine)  Cameron Wu is a 73 y.o.male with history of NMIBC, Hairy cell leukemia, BPH, IBS being seen at [facility] for [facility] UC of renal pelvis. Referred by Dr. Donnice Siad.   Pathology reviewed from 6/13. RIGHT RENAL PELVIS MASS, BIOPSY:  Detached small fragment of high grade urothelial carcinoma.   Started EVP and plan for 4 cycles and repeat imaging.    Developed diffuse rash over the body. No oral lesion. Will start steroid. Assessment & Plan   No orders of the defined types were placed in this encounter.    [name], MD  INTERVAL HISTORY: Patient returns for follow-up.  Oncology History  Bladder cancer (HCC)  03/26/2021 Initial Diagnosis   Bladder cancer (HCC)   12/02/2023 -  Chemotherapy   Patient is on Treatment Plan : UROTHELIAL ADVANCED, METASTATIC ENFORTUMAB D1, D8 + PEMBROLIZUMAB  (200) D1 Q21D     12/08/2023 Cancer Staging   Staging form: Urinary Bladder, AJCC 8th Edition - Clinical: Stage IIIB (cT3, cN2, cM0) - Signed by Chihuahua [name], MD on 12/08/2023 Stage prefix: Initial diagnosis WHO/ISUP grade (low/high): High Grade Histologic grading system: 2 grade system      PHYSICAL EXAMINATION: ECOG PERFORMANCE STATUS: {CHL ONC ECOG PS:[phone]}  There were no vitals filed for this visit. There were no vitals filed for this visit.  GENERAL: alert, no distress and comfortable SKIN: skin color normal and no bruising or petechiae or jaundice on exposed skin EYES: normal, sclera clear OROPHARYNX: no exudate  NECK: No palpable mass LYMPH:  no palpable cervical, axillary lymphadenopathy  LUNGS: clear to auscultation and percussion with normal breathing effort HEART: regular rate & rhythm  ABDOMEN: abdomen soft, non-tender and nondistended. Musculoskeletal: no edema NEURO: no focal motor/sensory deficits  Relevant data reviewed during  this visit included ***

## 2023-12-22 NOTE — Assessment & Plan Note (Signed)
 Postpone cycle 2 to next week Return next week for c2 Follow up as scheduled next week

## 2023-12-23 ENCOUNTER — Inpatient Hospital Stay: Admitting: Dietician

## 2023-12-23 ENCOUNTER — Inpatient Hospital Stay

## 2023-12-23 ENCOUNTER — Inpatient Hospital Stay (HOSPITAL_BASED_OUTPATIENT_CLINIC_OR_DEPARTMENT_OTHER)

## 2023-12-23 VITALS — BP 138/68 | HR 62 | Temp 97.2°F | Resp 16 | Ht 72.0 in | Wt 161.0 lb

## 2023-12-23 DIAGNOSIS — C679 Malignant neoplasm of bladder, unspecified: Secondary | ICD-10-CM | POA: Diagnosis not present

## 2023-12-23 DIAGNOSIS — Z5112 Encounter for antineoplastic immunotherapy: Secondary | ICD-10-CM | POA: Diagnosis not present

## 2023-12-23 DIAGNOSIS — C678 Malignant neoplasm of overlapping sites of bladder: Secondary | ICD-10-CM

## 2023-12-23 DIAGNOSIS — D702 Other drug-induced agranulocytosis: Secondary | ICD-10-CM | POA: Diagnosis not present

## 2023-12-23 DIAGNOSIS — R630 Anorexia: Secondary | ICD-10-CM | POA: Diagnosis not present

## 2023-12-23 DIAGNOSIS — R21 Rash and other nonspecific skin eruption: Secondary | ICD-10-CM

## 2023-12-23 LAB — CMP (CANCER CENTER ONLY)
ALT: 22 U/L (ref 0–44)
AST: 19 U/L (ref 15–41)
Albumin: 3.9 g/dL (ref 3.5–5.0)
Alkaline Phosphatase: 77 U/L (ref 38–126)
Anion gap: 4 — ABNORMAL LOW (ref 5–15)
BUN: 20 mg/dL (ref 8–23)
CO2: 32 mmol/L (ref 22–32)
Calcium: 9.4 mg/dL (ref 8.9–10.3)
Chloride: 101 mmol/L (ref 98–111)
Creatinine: 1.23 mg/dL (ref 0.61–1.24)
GFR, Estimated: >60 mL/min (ref >60)
Glucose, Bld: 149 mg/dL — ABNORMAL HIGH (ref 70–99)
Potassium: 3.9 mmol/L (ref 3.5–5.1)
Sodium: 137 mmol/L (ref 135–145)
Total Bilirubin: 0.3 mg/dL (ref 0.0–1.2)
Total Protein: 7 g/dL (ref 6.5–8.1)

## 2023-12-23 LAB — CBC WITH DIFFERENTIAL (CANCER CENTER ONLY)
Abs Immature Granulocytes: 0.01 K/uL (ref 0.00–0.07)
Basophils Absolute: 0 K/uL (ref 0.0–0.1)
Basophils Relative: 0 %
Eosinophils Absolute: 0 K/uL (ref 0.0–0.5)
Eosinophils Relative: 0 %
HCT: 28.7 % — ABNORMAL LOW (ref 39.0–52.0)
Hemoglobin: 9.9 g/dL — ABNORMAL LOW (ref 13.0–17.0)
Immature Granulocytes: 0 %
Lymphocytes Relative: 36 %
Lymphs Abs: 0.8 K/uL (ref 0.7–4.0)
MCH: 31.8 pg (ref 26.0–34.0)
MCHC: 34.5 g/dL (ref 30.0–36.0)
MCV: 92.3 fL (ref 80.0–100.0)
Monocytes Absolute: 0.7 K/uL (ref 0.1–1.0)
Monocytes Relative: 30 %
Neutro Abs: 0.8 K/uL — ABNORMAL LOW (ref 1.7–7.7)
Neutrophils Relative %: 34 %
Platelet Count: 285 K/uL (ref 150–400)
RBC: 3.11 MIL/uL — ABNORMAL LOW (ref 4.22–5.81)
RDW: 13.4 % (ref 11.5–15.5)
WBC Count: 2.3 K/uL — ABNORMAL LOW (ref 4.0–10.5)
nRBC: 0 % (ref 0.0–0.2)

## 2023-12-23 NOTE — Assessment & Plan Note (Addendum)
 Postpone treatment for 1 week

## 2023-12-23 NOTE — Assessment & Plan Note (Addendum)
 Mild improvement on steroid. Continue steroid taper Continue Kenalog  cream twice daily until resolution.

## 2023-12-23 NOTE — Progress Notes (Signed)
 Nutrition Assessment   Reason for Assessment:  Weight loss    ASSESSMENT:  73 year old male with urothelial cancer.  History of hairy cell leukemia, BPH, IBS.  Patient on enfortumab and keytruda .  Not receiving treatment today due to neutropenia, rash.  Met with patient following MD appointment.  Reports that he is having taste alterations.  Some foods don't taste the same as before.  Also reports has gas that builds up in stomach and prevents him from eating more.  Reports that following treatment had diarrhea but now constipation.  Last bowel movement was this am but was hard stools.  Typical breakfast is oatmeal with doughnut holes and coffee or 2 eggs and toast with jelly.  Lunch is peanut butter sandwich or pimento cheese.  Supper is meat and 1 side.  Has not been snacking.  Drinks water  mostly.  Says that he is not having any nausea.  Started drinking ensure this week (1 daily)   Medications: mylicon, medrol  dosepak, colace, compazine , zofran , calcium  and vit D, MVI   Labs: reviewed   Anthropometrics:   Height: 72 inches Weight: 161 lb today 169 lb on 5/7 BMI: 21  5% weight loss in the last 4 months   Estimated Energy Needs  Kcals: 1825-2100 Protein: 73-87 g Fluid: 1825-2100 ml   NUTRITION DIAGNOSIS: Inadequate oral intake related to cancer treatment side effects as evidenced by 5% weight loss in the last 4 months, taste alterations and decreased intake     INTERVENTION:  Discussed strategies to help with constipation.  Encouraged patient to contact MD if over the counter medication is not working.  Discussed strategies for taste change. Handout provided Encouraged 350+ calorie shake or more.  Sample of ensure completed provided and coupons.   Contact information provided   MONITORING, EVALUATION, GOAL: weight trends, intake   Next Visit: Friday, Sept 12 during infusion  Gabriellia Rempel B. Dasie SOLON, CSO, LDN Registered Dietitian [phone]

## 2023-12-23 NOTE — Assessment & Plan Note (Addendum)
 Postpone treatment for 1 week Neutropenic precautions

## 2023-12-30 ENCOUNTER — Other Ambulatory Visit

## 2023-12-30 ENCOUNTER — Inpatient Hospital Stay (HOSPITAL_BASED_OUTPATIENT_CLINIC_OR_DEPARTMENT_OTHER)

## 2023-12-30 ENCOUNTER — Ambulatory Visit

## 2023-12-30 ENCOUNTER — Inpatient Hospital Stay

## 2023-12-30 ENCOUNTER — Inpatient Hospital Stay: Admitting: Dietician

## 2023-12-30 VITALS — BP 142/74 | HR 66 | Temp 98.2°F | Resp 18

## 2023-12-30 VITALS — BP 139/66 | HR 72 | Temp 97.2°F | Resp 17 | Wt 157.3 lb

## 2023-12-30 DIAGNOSIS — N3281 Overactive bladder: Secondary | ICD-10-CM | POA: Diagnosis not present

## 2023-12-30 DIAGNOSIS — C679 Malignant neoplasm of bladder, unspecified: Secondary | ICD-10-CM | POA: Diagnosis not present

## 2023-12-30 DIAGNOSIS — C678 Malignant neoplasm of overlapping sites of bladder: Secondary | ICD-10-CM

## 2023-12-30 DIAGNOSIS — Z9189 Other specified personal risk factors, not elsewhere classified: Secondary | ICD-10-CM

## 2023-12-30 DIAGNOSIS — L270 Generalized skin eruption due to drugs and medicaments taken internally: Secondary | ICD-10-CM

## 2023-12-30 DIAGNOSIS — Z5112 Encounter for antineoplastic immunotherapy: Secondary | ICD-10-CM | POA: Diagnosis not present

## 2023-12-30 LAB — CMP (CANCER CENTER ONLY)
ALT: 14 U/L (ref 0–44)
AST: 14 U/L — ABNORMAL LOW (ref 15–41)
Albumin: 3.9 g/dL (ref 3.5–5.0)
Alkaline Phosphatase: 71 U/L (ref 38–126)
Anion gap: 4 — ABNORMAL LOW (ref 5–15)
BUN: 19 mg/dL (ref 8–23)
CO2: 29 mmol/L (ref 22–32)
Calcium: 8.8 mg/dL — ABNORMAL LOW (ref 8.9–10.3)
Chloride: 104 mmol/L (ref 98–111)
Creatinine: 1.27 mg/dL — ABNORMAL HIGH (ref 0.61–1.24)
GFR, Estimated: 60 mL/min — ABNORMAL LOW (ref >60)
Glucose, Bld: 76 mg/dL (ref 70–99)
Potassium: 4 mmol/L (ref 3.5–5.1)
Sodium: 137 mmol/L (ref 135–145)
Total Bilirubin: 0.3 mg/dL (ref 0.0–1.2)
Total Protein: 6.8 g/dL (ref 6.5–8.1)

## 2023-12-30 LAB — CBC WITH DIFFERENTIAL (CANCER CENTER ONLY)
Abs Immature Granulocytes: 0.03 K/uL (ref 0.00–0.07)
Basophils Absolute: 0 K/uL (ref 0.0–0.1)
Basophils Relative: 0 %
Eosinophils Absolute: 0 K/uL (ref 0.0–0.5)
Eosinophils Relative: 0 %
HCT: 31 % — ABNORMAL LOW (ref 39.0–52.0)
Hemoglobin: 10.4 g/dL — ABNORMAL LOW (ref 13.0–17.0)
Immature Granulocytes: 0 %
Lymphocytes Relative: 20 %
Lymphs Abs: 1.6 K/uL (ref 0.7–4.0)
MCH: 31.5 pg (ref 26.0–34.0)
MCHC: 33.5 g/dL (ref 30.0–36.0)
MCV: 93.9 fL (ref 80.0–100.0)
Monocytes Absolute: 0.9 K/uL (ref 0.1–1.0)
Monocytes Relative: 10 %
Neutro Abs: 5.8 K/uL (ref 1.7–7.7)
Neutrophils Relative %: 70 %
Platelet Count: 193 K/uL (ref 150–400)
RBC: 3.3 MIL/uL — ABNORMAL LOW (ref 4.22–5.81)
RDW: 14.2 % (ref 11.5–15.5)
WBC Count: 8.4 K/uL (ref 4.0–10.5)
nRBC: 0 % (ref 0.0–0.2)

## 2023-12-30 LAB — C-REACTIVE PROTEIN: CRP: 0.9 mg/dL (ref <1.0)

## 2023-12-30 MED ORDER — SODIUM CHLORIDE 0.9 % IV SOLN
200.0000 mg | Freq: Once | INTRAVENOUS | Status: AC
  Administered 2023-12-30: 200 mg via INTRAVENOUS
  Filled 2023-12-30: qty 200

## 2023-12-30 MED ORDER — PROCHLORPERAZINE MALEATE 10 MG PO TABS
10.0000 mg | ORAL_TABLET | Freq: Once | ORAL | Status: AC
  Administered 2023-12-30: 10 mg via ORAL
  Filled 2023-12-30: qty 1

## 2023-12-30 MED ORDER — SODIUM CHLORIDE 0.9 % IV SOLN: INTRAVENOUS | Status: DC

## 2023-12-30 MED ORDER — TRIAMCINOLONE ACETONIDE 0.5 % EX OINT: 1.0000 | TOPICAL_OINTMENT | Freq: Two times a day (BID) | CUTANEOUS | 0 refills | Status: DC

## 2023-12-30 MED ORDER — SODIUM CHLORIDE 0.9 % IV SOLN
90.0000 mg | Freq: Once | INTRAVENOUS | Status: AC
  Administered 2023-12-30: 90 mg via INTRAVENOUS
  Filled 2023-12-30: qty 9

## 2023-12-30 MED ORDER — VIBEGRON 75 MG PO TABS: 75.0000 mg | ORAL_TABLET | Freq: Every evening | ORAL | 1 refills | Status: DC

## 2023-12-30 NOTE — Assessment & Plan Note (Addendum)
 Toxicities being monitored in relation to EV Monitor for skin toxicity Prophylactic Desitin twice daily to intertriginous, flexural, and acral areas Apply emollient moisturizer twice a day Use sunscreen if outside for prolonged period of time. Hydration 64+ ounces a day Avoid hot showers Monitor for neuropathy Monitor for hyperglycemia.  Baseline A1c Monitor for signs of pneumonitis Warning signs: Malaise.  Fever of more than 100.4.  Mucosal involvement in the ocular, oral or genital area.  Skin pain, burning, numbness tingling. Monitor for diarrhea and other GI symptoms

## 2023-12-30 NOTE — Progress Notes (Signed)
 Nutrition Follow-up:  Pt with urothelial cancer. He is receiving neoadjuvant Padcev  + keytruda . Pt under care of Dr. Tina  Met with pt in infusion. He reports ongoing poor oral intake due to altered taste. Pt misses eating sweets. Says they are too sweet at this time. He also reports foods tasting like nothing. Patient able to pick up on tart flavors. Planning to have pepperoni pizza tonight. He is drinking 3 bottles of water , some lactaid milk and ginger ale. Supplementing po with 2 Ensure dark chocolate. Patient has not tried baking soda salt water  gargles.    Medications: reviewed   Labs: Cr 1.57  Anthropometrics: Wt 157 lb 4.8 oz decreased 2.5% from 161 lb on 9/5 - clinically severe for time frame   NUTRITION DIAGNOSIS: Inadequate oral intake continues   INTERVENTION:  Reviewed strategies for altered taste, encouraged leaning in to flavors that work and suggested soft moist textures so reduce mastication needs - offered ideas + smoothie recipes Encourage trying baking soda salt water  gargles - he has recipe and agreeable to try Continue Ensure 2/day - suggested adding vanilla Ensure to coffee for added calories/protein (samples + coupons provided) Contact information provided    MONITORING, EVALUATION, GOAL: wt trends, intake   NEXT VISIT: Friday September 19 during infusion

## 2023-12-30 NOTE — Progress Notes (Signed)
 [facility] OFFICE PROGRESS NOTE  Patient Care Team: [name], MD as PCP - General (Internal Medicine)  Assessment & Plan Malignant neoplasm of urinary bladder, unspecified site [facility]) C2D1 today Return next week for D8 Follow up as scheduled next week Overactive bladder Trial of vibegron  75 mg nightly Drug-induced skin rash Mostly resolved. Recommend using moisturizer 4 times a day. Sunscreen if outside Refill Kenalog  cream to use twice daily if developing rash after next treatment At risk for side effect of medication Toxicities being monitored in relation to EV Monitor for skin toxicity Prophylactic Desitin twice daily to intertriginous, flexural, and acral areas Apply emollient moisturizer twice a day Use sunscreen if outside for prolonged period of time. Hydration 64+ ounces a day Avoid hot showers Monitor for neuropathy Monitor for hyperglycemia.  Baseline A1c Monitor for signs of pneumonitis Warning signs: Malaise.  Fever of more than 100.4.  Mucosal involvement in the ocular, oral or genital area.  Skin pain, burning, numbness tingling. Monitor for diarrhea and other GI symptoms  Follow-up next week. May schedule cycle 3 and 4.   [name], MD  INTERVAL HISTORY: Patient returns for follow-up.  Recovering well this week.  Rash is mostly resolved.  Not much visible for patient.  No fever, signs of infection.  Chronic fatigue is about the same.  No coughing, worsening shortness of breath, abdominal symptoms, diarrhea.  Oncology History  Bladder cancer (HCC)  03/26/2021 Initial Diagnosis   Bladder cancer (HCC)   12/02/2023 -  Chemotherapy   Patient is on Treatment Plan : UROTHELIAL ADVANCED, METASTATIC ENFORTUMAB D1, D8 + PEMBROLIZUMAB  (200) D1 Q21D     12/08/2023 Cancer Staging   Staging form: Urinary Bladder, AJCC 8th Edition - Clinical: Stage IIIB (cT3, cN2, cM0) - Signed by Chihuahua [name], MD on 12/08/2023 Stage prefix: Initial  diagnosis WHO/ISUP grade (low/high): High Grade Histologic grading system: 2 grade system      PHYSICAL EXAMINATION: ECOG PERFORMANCE STATUS: 2 - Symptomatic, <50% confined to bed  Vitals:   12/30/23 1403  BP: 139/66  Pulse: 72  Resp: 17  Temp: (!) 97.2 F (36.2 C)  SpO2: 100%   Filed Weights   12/30/23 1403  Weight: 157 lb 4.8 oz (71.4 kg)    GENERAL: alert, no distress and comfortable SKIN: skin color normal and significant rash on the trunk or back LUNGS: clear to auscultation and no wheeze or rales HEART: regular rate & rhythm  ABDOMEN: abdomen soft, non-tender and nondistended.   Relevant data reviewed during this visit included labs.

## 2023-12-30 NOTE — Assessment & Plan Note (Addendum)
 C2D1 today Return next week for D8 Follow up as scheduled next week

## 2023-12-30 NOTE — Progress Notes (Signed)
 Adjust Padcev  based on today's wt per Dr Tina

## 2024-01-02 ENCOUNTER — Ambulatory Visit: Payer: Self-pay

## 2024-01-02 NOTE — Telephone Encounter (Signed)
 Cameron Wu states he is not having any more rash, just a few scars.

## 2024-01-02 NOTE — Telephone Encounter (Signed)
-----   Message from [name] sent at 01/02/2024 10:15 AM EDT ----- Zorita would you call him and make sure  not having more rash from enfortumab last week. If so start triamcinolone  ointment bid right away  and let us  know if not helping. Also use Aveeno or Eucerin UreadRepair if needed for dry or affected areas. Thanks.  ----- Message ----- From: Rebecka, [facility] [place] Sent: 12/30/2023   2:11 PM EDT To: [name], MD

## 2024-01-04 ENCOUNTER — Other Ambulatory Visit: Payer: Self-pay

## 2024-01-05 NOTE — Progress Notes (Unsigned)
 [facility] [phone]   [phone]  PROGRESS NOTE  Patient Care Team: [name], MD as PCP - General (Internal Medicine)  DIAGNOSIS: Bladder cancer  Oncology History  Bladder cancer [facility])  03/26/2021 Initial Diagnosis   Bladder cancer (HCC)   12/02/2023 -  Chemotherapy   Patient is on Treatment Plan : UROTHELIAL ADVANCED, METASTATIC ENFORTUMAB D1, D8 + PEMBROLIZUMAB  (200) D1 Q21D     12/08/2023 Cancer Staging   Staging form: Urinary Bladder, AJCC 8th Edition - Clinical: Stage IIIB (cT3, cN2, cM0) - Signed by Tina Pauletta BROCKS, MD on 12/08/2023 Stage prefix: Initial diagnosis WHO/ISUP grade (low/high): High Grade Histologic grading system: 2 grade system    CURRENT TREATMENT: Enfortumab plus Pembrolizumab   INTERVAL HISTORY:  Cameron Wu 73 y.o. malereturns for a follow up for continued management of bladder cancer. He is due for Cycle 2, Day 8 of treatment today.   On exam today, Cameron Wu reports that he has noticed intermittent episodes of dizziness when he is changing positions.  He denies any syncopal episodes.  Patient reports his energy is fairly stable.  He can complete his daily activities on his own.  He denies nausea, vomiting or abdominal pain.  He does have intermittent episodes of diarrhea that resolve on its own.  He denies easy bruising or signs of overt bleeding.  He reports that in rare occasions, he will notice very mild blood-tinged urine when he is straining such as lifting something heavy.  He denies fevers, chills, night sweats, shortness of breath, chest pain or cough.  He has no other complaints.  Rest of the 10 point ROS is below.  MEDICAL HISTORY:  Past Medical History:  Diagnosis Date   Abnormal liver function tests 05/03/2011   Arthritis    Bladder cancer (HCC) 02/2021   high grade nonmuscle invasive bladder cancer   BPH (benign prostatic hypertrophy) 05/03/2011   Chronic kidney disease    Complication of  anesthesia    coughs alot and has a very dry, scratchy throat per patient   Elevated transaminase level    GERD (gastroesophageal reflux disease)    Hairy cell leukemia, in remission (HCC) 1992   remission since 2008   History of kidney stones    Hypertension    IBS (irritable bowel syndrome)    Irritable bowel syndrome    2 yrs ago, states he had inflammation around anus. Biopsy: inclusive   Lumbar foraminal stenosis 12/31/2019   Lumbar radiculopathy 11/15/2019   Sleep apnea     SURGICAL HISTORY: Past Surgical History:  Procedure Laterality Date   BACK SURGERY     BONE MARROW BIOPSY     2007 and 1992   CATARACT EXTRACTION W/PHACO Right 08/15/2020   Procedure: CATARACT EXTRACTION PHACO AND INTRAOCULAR LENS PLACEMENT RIGHT EYE;  Surgeon: Harrie Agent, MD;  Location: [facility];  Service: Ophthalmology;  Laterality: Right;  right CDE=7.75   COLONOSCOPY  04/19/2006   with polyps   COLONOSCOPY  09/30/2011   Procedure: COLONOSCOPY;  Surgeon: Claudis RAYMOND Rivet, MD;  Location: [facility];  Service: Endoscopy;  Laterality: N/A;  730   COLONOSCOPY N/A 01/11/2019   Procedure: COLONOSCOPY;  Surgeon: Rivet Claudis RAYMOND, MD;  Location: [facility];  Service: Endoscopy;  Laterality: N/A;  100-office move to 9:30am   CYSTOSCOPY W/ RETROGRADES Bilateral 02/23/2021   Procedure: CYSTOSCOPY WITH RETROGRADE PYELOGRAM;  Surgeon: Matilda Senior, MD;  Location: [facility];  Service: Urology;  Laterality: Bilateral;  CYSTOSCOPY W/ RETROGRADES Bilateral 07/05/2022   Procedure: CYSTOSCOPY WITH RETROGRADE PYELOGRAM;  Surgeon: Matilda Senior, MD;  Location: [facility];  Service: Urology;  Laterality: Bilateral;   CYSTOSCOPY W/ RETROGRADES Right 09/30/2023   Procedure: CYSTOSCOPY, WITH RETROGRADE PYELOGRAM, BIOPSY OF MASS;  Surgeon: Selma Donnice SAUNDERS, MD;  Location: [facility];  Service: Urology;  Laterality: Right;   CYSTOSCOPY WITH BIOPSY N/A 07/05/2022   Procedure:  CYSTOSCOPY WITH BIOPSY;  Surgeon: Matilda Senior, MD;  Location: [facility];  Service: Urology;  Laterality: N/A;  30 MINS   CYSTOSCOPY WITH URETEROSCOPY AND STENT PLACEMENT Right 09/30/2023   Procedure: ERNESTA, WITH STENT EXCHANGE;  Surgeon: Selma Donnice SAUNDERS, MD;  Location: [facility];  Service: Urology;  Laterality: Right;   HX COLON POLYPS     IR IMAGING GUIDED PORT INSERTION  11/24/2023   TONSILLECTOMY     childhood   TOTAL HIP ARTHROPLASTY Right 12/19/2022   Procedure: RIGHT POSTERIOR TOTAL HIP ARTHROPLASTY;  Surgeon: Edna Toribio LABOR, MD;  Location: [facility];  Service: Orthopedics;  Laterality: Right;   TRANSFORAMINAL LUMBAR INTERBODY FUSION W/ MIS 1 LEVEL Right 11/18/2020   Procedure: Right Lumbar Five-Sacral One Minimally invasive transforaminal lumbar interbody fusion;  Surgeon: Cheryle Debby LABOR, MD;  Location: [facility];  Service: Neurosurgery;  Laterality: Right;   TRANSURETHRAL RESECTION OF BLADDER TUMOR N/A 03/26/2021   Procedure: REPEAT TRANSURETHRAL RESECTION OF BLADDER TUMOR (TURBT)/ POST OPERATIVE INSTILLATION OF GEMCITABINE ;  Surgeon: Matilda Senior, MD;  Location: [facility];  Service: Urology;  Laterality: N/A;   TRANSURETHRAL RESECTION OF BLADDER TUMOR WITH MITOMYCIN -C N/A 02/23/2021   Procedure: TRANSURETHRAL RESECTION OF BLADDER TUMOR;  Surgeon: Matilda Senior, MD;  Location: [facility];  Service: Urology;  Laterality: N/A;   TRANSURETHRAL RESECTION OF BLADDER TUMOR WITH MITOMYCIN -C N/A 10/29/2021   Procedure: TRANSURETHRAL RESECTION OF BLADDER TUMOR WITH GEMCITABINE ;  Surgeon: Matilda Senior, MD;  Location: [facility];  Service: Urology;  Laterality: N/A;    SOCIAL HISTORY: Social History   Socioeconomic History   Marital status: Married    Spouse name: Not on file   Number of children: Not on file   Years of education: Not on file   Highest education level: Not on file  Occupational  History   Not on file  Tobacco Use   Smoking status: Former    Current packs/day: 0.00    Types: Cigarettes    Quit date: 12/18/2005    Years since quitting: 18.0   Smokeless tobacco: Never  Vaping Use   Vaping status: Never Used  Substance and Sexual Activity   Alcohol use: Yes    Alcohol/week: 4.0 standard drinks of alcohol    Types: 4 Cans of beer per week    Comment: social   Drug use: No   Sexual activity: Not on file  Other Topics Concern   Not on file  Social History Narrative   Not on file   Social Drivers of Health   Financial Resource Strain: Not on file  Food Insecurity: No Food Insecurity (10/24/2023)   Hunger Vital Sign    Worried About Running Out of Food in the Last Year: Never true    Ran Out of Food in the Last Year: Never true  Transportation Needs: No Transportation Needs (10/24/2023)   PRAPARE - Administrator, Civil Service (Medical): No    Lack of Transportation (Non-Medical): No  Physical Activity: Not on file  Stress: Not on file  Social Connections: Not on file  Intimate Partner Violence: Not At Risk (10/24/2023)   Humiliation, Afraid, Rape, and Kick questionnaire    Fear of Current or Ex-Partner: No    Emotionally Abused: No    Physically Abused: No    Sexually Abused: No    FAMILY HISTORY: No family history on file.  ALLERGIES:  is allergic to penicillins and pollen extract.  MEDICATIONS:  Current Outpatient Medications  Medication Sig Dispense Refill   acetaminophen  (TYLENOL ) 500 MG tablet Take 1,000 mg by mouth every 8 (eight) hours as needed for moderate pain (pain score 4-6).     Calcium  Carb-Cholecalciferol (CALCIUM  + VITAMIN D3 PO) Take 1 tablet by mouth daily with lunch.     Carboxymethylcell-Glycerin  PF 0.5-0.9 % SOLN Place 2 drops into both eyes 4 (four) times daily. 1 each 11   docusate sodium  (COLACE) 100 MG capsule Take 1 capsule (100 mg total) by mouth daily as needed for up to 30 doses. 30 capsule 0   donepezil   (ARICEPT ) 10 MG tablet Take 10 mg by mouth daily.     doxylamine , Sleep, (UNISOM ) 25 MG tablet Take 25 mg by mouth at bedtime as needed for sleep.     finasteride  (PROSCAR ) 5 MG tablet Take 1 tablet (5 mg total) by mouth every other day. 90 tablet 3   Ginkgo Biloba (GINKOBA PO) Take 120 mg by mouth daily.     Homeopathic Products (LEG CRAMPS PM SL) Place 2 tablets under the tongue daily as needed (leg cramps).     levocetirizine (XYZAL) 5 MG tablet Take 5 mg by mouth at bedtime as needed for allergies.     lidocaine -prilocaine  (EMLA ) cream Apply to affected area once 30 g 3   loperamide (IMODIUM) 2 MG capsule Take 2 mg by mouth as needed for diarrhea or loose stools.     Menthol , Topical Analgesic, (BIOFREEZE COOL THE PAIN) 4 % GEL Apply 1 Application topically as needed (pain).     Multiple Vitamins-Minerals (ONE-A-DAY MENS 50+) TABS Take 1 tablet by mouth daily.     Naphazoline-Pheniramine (OPCON-A) 0.027-0.315 % SOLN Place 1 drop into both eyes as needed (eye irritation).     ondansetron  (ZOFRAN ) 8 MG tablet Take 1 tablet (8 mg total) by mouth every 8 (eight) hours as needed for nausea or vomiting. 30 tablet 1   pantoprazole (PROTONIX) 40 MG tablet Take 40 mg by mouth every morning.     prochlorperazine  (COMPAZINE ) 10 MG tablet Take 1 tablet (10 mg total) by mouth every 6 (six) hours as needed for nausea or vomiting. 30 tablet 1   rosuvastatin  (CRESTOR ) 10 MG tablet Take 10 mg by mouth at bedtime.     Saline (ARY NASAL MIST ALLERGY/SINUS) 2.65 % SOLN Place 1 spray into the nose as needed (congestion).     simethicone  (MYLICON) 125 MG chewable tablet Chew 125 mg by mouth 2 (two) times daily as needed for flatulence.     tamsulosin  (FLOMAX ) 0.4 MG CAPS capsule Take 1 capsule (0.4 mg total) by mouth every other day. 90 capsule 3   Vibegron  75 MG TABS Take 1 tablet (75 mg total) by mouth at bedtime. 30 tablet 1   triamcinolone  ointment (KENALOG ) 0.5 % Apply 1 Application topically 2 (two) times  daily. 30 g 0   No current facility-administered medications for this visit.   Facility-Administered Medications Ordered in Other Visits  Medication Dose Route Frequency Provider Last Rate Last Admin   0.9 %  sodium chloride   infusion   Intravenous Continuous Tina Pauletta BROCKS, MD       enfortumab vedotin -ejfv (PADCEV ) 90 mg in sodium chloride  0.9 % 50 mL (1.5254 mg/mL) chemo infusion  1.25 mg/kg (Treatment Plan Recorded) Intravenous Once Tina Pauletta BROCKS, MD       gemcitabine  (GEMZAR ) chemo syringe for bladder instillation 2,000 mg  2,000 mg Bladder Instillation Once Dahlstedt, Stephen, MD       prochlorperazine  (COMPAZINE ) tablet 10 mg  10 mg Oral Once Tina Pauletta BROCKS, MD        REVIEW OF SYSTEMS:   Constitutional: ( - ) fevers, ( - )  chills , ( - ) night sweats Eyes: ( - ) blurriness of vision, ( - ) double vision, ( - ) watery eyes Ears, nose, mouth, throat, and face: ( - ) mucositis, ( - ) sore throat Respiratory: ( - ) cough, ( - ) dyspnea, ( - ) wheezes Cardiovascular: ( - ) palpitation, ( - ) chest discomfort, ( - ) lower extremity swelling Gastrointestinal:  ( - ) nausea, ( - ) heartburn, ( - ) change in bowel habits Skin: ( - ) abnormal skin rashes Lymphatics: ( - ) new lymphadenopathy, ( - ) easy bruising Neurological: ( - ) numbness, ( - ) tingling, ( - ) new weaknesses Behavioral/Psych: ( - ) mood change, ( - ) new changes  All other systems were reviewed with the patient and are negative.  PHYSICAL EXAMINATION: ECOG PERFORMANCE STATUS: 1 - Symptomatic but completely ambulatory  Vitals:   01/06/24 0920 01/06/24 0925  BP: 95/71 109/63  Pulse:    Resp:    Temp:    SpO2:     Filed Weights   01/06/24 0833  Weight: 157 lb (71.2 kg)    GENERAL: well appearing male in NAD  SKIN: skin color, texture, turgor are normal, no rashes or significant lesions EYES: conjunctiva are pink and non-injected, sclera clear OROPHARYNX: no exudate, no erythema; lips, buccal mucosa, and  tongue normal  LUNGS: clear to auscultation and percussion with normal breathing effort HEART: regular rate & rhythm and no murmurs and no lower extremity edema Musculoskeletal: no cyanosis of digits and no clubbing  PSYCH: alert & oriented x 3, fluent speech NEURO: no focal motor/sensory deficits  LABORATORY DATA:  I have reviewed the data as listed    Latest Ref Rng & Units 01/06/2024    8:08 AM 12/30/2023    1:43 PM 12/23/2023   11:24 AM  CBC  WBC 4.0 - 10.5 K/uL 3.4  8.4  2.3   Hemoglobin 13.0 - 17.0 g/dL 9.9  89.5  9.9   Hematocrit 39.0 - 52.0 % 28.6  31.0  28.7   Platelets 150 - 400 K/uL 110  193  285        Latest Ref Rng & Units 01/06/2024    8:08 AM 12/30/2023    1:43 PM 12/23/2023   11:24 AM  CMP  Glucose 70 - 99 mg/dL 883  76  850   BUN 8 - 23 mg/dL 20  19  20    Creatinine 0.61 - 1.24 mg/dL 8.73  8.72  8.76   Sodium 135 - 145 mmol/L 138  137  137   Potassium 3.5 - 5.1 mmol/L 3.8  4.0  3.9   Chloride 98 - 111 mmol/L 101  104  101   CO2 22 - 32 mmol/L 31  29  32   Calcium  8.9 - 10.3 mg/dL 9.1  8.8  9.4   Total Protein 6.5 - 8.1 g/dL 6.9  6.8  7.0   Total Bilirubin 0.0 - 1.2 mg/dL 0.5  0.3  0.3   Alkaline Phos 38 - 126 U/L 68  71  77   AST 15 - 41 U/L 19  14  19    ALT 0 - 44 U/L 13  14  22    RADIOGRAPHIC STUDIES: I have personally reviewed the radiological images as listed and agreed with the findings in the report. No results found.  ASSESSMENT & PLAN DEADRIAN TOYA is a 73 y.o. male who presents to the clinic for bladder cancer.   #Malignant neoplasm of urinary bladder: --Started Enfortumab plus Pembrolizumab  on 12/02/2023 PLAN: --Due for Cycle 2, Day 8 with Enfortumab today --Labs from today were reviewed and adequate for treatment. WBC 3.4, Hgb 9.9, Plt 110K, Creatinine stable at 1.26, LFTs normal.  --Proceed with treatment without any dose modifications --RTC in 2 weeks with labs/follow up prior to Cycle 3, Day 1.   #Overactive bladder:  --Currently on  vibegron  75 mg nightly  #Drug induced skin rash: --currently using moisturizer 4x/day, sunscreen when outside, kenalog  cream BID  #Positional dizziness: --Orthostatic blood pressures obtained that showed 123/77 (supine), 107/68 (sitting) and 95/71 (standing).  --Advised to pause 10-15 secs between positions, drink 2 L of water  per day, add salt to diet.     No orders of the defined types were placed in this encounter.   All questions were answered. The patient knows to call the clinic with any problems, questions or concerns.  I have spent a total of 30 minutes minutes of face-to-face and non-face-to-face time, preparing to see the patient, performing a medically appropriate examination, counseling and educating the patient, ordering medications/tests/procedures, documenting clinical information in the electronic health record, independently interpreting results and communicating results to the patient, and care coordination.   Johnston Police, PA-C Department of Hematology/Oncology [facility] at [facility] Phone: [phone]

## 2024-01-06 ENCOUNTER — Inpatient Hospital Stay

## 2024-01-06 ENCOUNTER — Inpatient Hospital Stay (HOSPITAL_BASED_OUTPATIENT_CLINIC_OR_DEPARTMENT_OTHER): Admitting: Physician Assistant

## 2024-01-06 ENCOUNTER — Inpatient Hospital Stay: Admitting: Dietician

## 2024-01-06 VITALS — BP 109/63 | HR 85 | Temp 97.2°F | Resp 16 | Ht 72.0 in | Wt 157.0 lb

## 2024-01-06 DIAGNOSIS — C678 Malignant neoplasm of overlapping sites of bladder: Secondary | ICD-10-CM | POA: Diagnosis not present

## 2024-01-06 DIAGNOSIS — Z5112 Encounter for antineoplastic immunotherapy: Secondary | ICD-10-CM | POA: Diagnosis not present

## 2024-01-06 DIAGNOSIS — L270 Generalized skin eruption due to drugs and medicaments taken internally: Secondary | ICD-10-CM

## 2024-01-06 DIAGNOSIS — I951 Orthostatic hypotension: Secondary | ICD-10-CM

## 2024-01-06 LAB — CBC WITH DIFFERENTIAL (CANCER CENTER ONLY)
Abs Immature Granulocytes: 0.01 K/uL (ref 0.00–0.07)
Basophils Absolute: 0 K/uL (ref 0.0–0.1)
Basophils Relative: 0 %
Eosinophils Absolute: 0 K/uL (ref 0.0–0.5)
Eosinophils Relative: 0 %
HCT: 28.6 % — ABNORMAL LOW (ref 39.0–52.0)
Hemoglobin: 9.9 g/dL — ABNORMAL LOW (ref 13.0–17.0)
Immature Granulocytes: 0 %
Lymphocytes Relative: 16 %
Lymphs Abs: 0.6 K/uL — ABNORMAL LOW (ref 0.7–4.0)
MCH: 31.9 pg (ref 26.0–34.0)
MCHC: 34.6 g/dL (ref 30.0–36.0)
MCV: 92.3 fL (ref 80.0–100.0)
Monocytes Absolute: 0.3 K/uL (ref 0.1–1.0)
Monocytes Relative: 10 %
Neutro Abs: 2.5 K/uL (ref 1.7–7.7)
Neutrophils Relative %: 74 %
Platelet Count: 110 K/uL — ABNORMAL LOW (ref 150–400)
RBC: 3.1 MIL/uL — ABNORMAL LOW (ref 4.22–5.81)
RDW: 14.3 % (ref 11.5–15.5)
WBC Count: 3.4 K/uL — ABNORMAL LOW (ref 4.0–10.5)
nRBC: 0 % (ref 0.0–0.2)

## 2024-01-06 LAB — CMP (CANCER CENTER ONLY)
ALT: 13 U/L (ref 0–44)
AST: 19 U/L (ref 15–41)
Albumin: 3.8 g/dL (ref 3.5–5.0)
Alkaline Phosphatase: 68 U/L (ref 38–126)
Anion gap: 6 (ref 5–15)
BUN: 20 mg/dL (ref 8–23)
CO2: 31 mmol/L (ref 22–32)
Calcium: 9.1 mg/dL (ref 8.9–10.3)
Chloride: 101 mmol/L (ref 98–111)
Creatinine: 1.26 mg/dL — ABNORMAL HIGH (ref 0.61–1.24)
GFR, Estimated: >60 mL/min (ref >60)
Glucose, Bld: 116 mg/dL — ABNORMAL HIGH (ref 70–99)
Potassium: 3.8 mmol/L (ref 3.5–5.1)
Sodium: 138 mmol/L (ref 135–145)
Total Bilirubin: 0.5 mg/dL (ref 0.0–1.2)
Total Protein: 6.9 g/dL (ref 6.5–8.1)

## 2024-01-06 MED ORDER — TRIAMCINOLONE ACETONIDE 0.5 % EX OINT: 1.0000 | TOPICAL_OINTMENT | Freq: Two times a day (BID) | CUTANEOUS | 0 refills | Status: DC

## 2024-01-06 MED ORDER — SODIUM CHLORIDE 0.9 % IV SOLN
1.2500 mg/kg | Freq: Once | INTRAVENOUS | Status: AC
  Administered 2024-01-06: 90 mg via INTRAVENOUS
  Filled 2024-01-06: qty 9

## 2024-01-06 MED ORDER — SODIUM CHLORIDE 0.9 % IV SOLN: INTRAVENOUS | Status: DC

## 2024-01-06 MED ORDER — PROCHLORPERAZINE MALEATE 10 MG PO TABS
10.0000 mg | ORAL_TABLET | Freq: Once | ORAL | Status: AC
  Administered 2024-01-06: 10 mg via ORAL
  Filled 2024-01-06: qty 1

## 2024-01-06 NOTE — Patient Instructions (Signed)
 [facility] - A DEPT OF [facility]  Discharge Instructions: Thank you for choosing [facility] to provide your oncology and hematology care.   If you have a lab appointment with the Cancer Center, please go directly to the Cancer Center and check in at the registration area.   Wear comfortable clothing and clothing appropriate for easy access to any Portacath or PICC line.   We strive to give you quality time with your provider. You may need to reschedule your appointment if you arrive late (15 or more minutes).  Arriving late affects you and other patients whose appointments are after yours.  Also, if you miss three or more appointments without notifying the office, you may be dismissed from the clinic at the provider's discretion.      For prescription refill requests, have your pharmacy contact our office and allow 72 hours for refills to be completed.    Today you received the following chemotherapy and/or immunotherapy agents: enfortumab vedotin -ejfv      To help prevent nausea and vomiting after your treatment, we encourage you to take your nausea medication as directed.  BELOW ARE SYMPTOMS THAT SHOULD BE REPORTED IMMEDIATELY: *FEVER GREATER THAN 100.4 F (38 C) OR HIGHER *CHILLS OR SWEATING *NAUSEA AND VOMITING THAT IS NOT CONTROLLED WITH YOUR NAUSEA MEDICATION *UNUSUAL SHORTNESS OF BREATH *UNUSUAL BRUISING OR BLEEDING *URINARY PROBLEMS (pain or burning when urinating, or frequent urination) *BOWEL PROBLEMS (unusual diarrhea, constipation, pain near the anus) TENDERNESS IN MOUTH AND THROAT WITH OR WITHOUT PRESENCE OF ULCERS (sore throat, sores in mouth, or a toothache) UNUSUAL RASH, SWELLING OR PAIN  UNUSUAL VAGINAL DISCHARGE OR ITCHING   Items with * indicate a potential emergency and should be followed up as soon as possible or go to the Emergency Department if any problems should occur.  Please show the CHEMOTHERAPY ALERT CARD or  IMMUNOTHERAPY ALERT CARD at check-in to the Emergency Department and triage nurse.  Should you have questions after your visit or need to cancel or reschedule your appointment, please contact [facility] - A DEPT OF [facility]  Dept: [phone]  and follow the prompts.  Office hours are 8:00 a.m. to 4:30 p.m. Monday - Friday. Please note that voicemails left after 4:00 p.m. may not be returned until the following business day.  We are closed weekends and major holidays. You have access to a nurse at all times for urgent questions. Please call the main number to the clinic Dept: [phone] and follow the prompts.   For any non-urgent questions, you may also contact your provider using MyChart. We now offer e-Visits for anyone 72 and older to request care online for non-urgent symptoms. For details visit [email or web address].   Also download the MyChart app! Go to the app store, search MyChart, open the app, select [place], and log in with your MyChart username and password.

## 2024-01-06 NOTE — Progress Notes (Signed)
 Nutrition Follow-up:  Pt with urothelial cancer. He is receiving neoadjuvant Padcev  + keytruda . Pt under care of Dr. Tina   Met with patient in infusion. He reports altered taste continues, however has found success with some of RD suggestions. Reports trying the chocolate Ensure mixed with packet of CIB. Endorses increased energy lasting the afternoon after drinking. States he has not felt that good in some time. Patient would appreciate a few more samples.    Medications: reviewed   Labs: Cr 1.26  Anthropometrics: Wt 157 lb today - stable this week  9/12 - 157 lb 4.8 oz    NUTRITION DIAGNOSIS: Inadequate oral intake - improving    INTERVENTION:  Encouraged daily high protein shake (Ensure Complete with CIB powder) - samples + coupons provided     MONITORING, EVALUATION, GOAL: wt trends, intake    NEXT VISIT: Friday October 10 during infusion

## 2024-01-19 NOTE — Progress Notes (Unsigned)
 [facility] OFFICE PROGRESS NOTE  Patient Care Team: [name], MD as PCP - General (Internal Medicine)  Neziah is a 73 y.o.male with history of NMIBC, Hairy cell leukemia, BPH, IBS being seen at [facility] for [facility] UC of renal pelvis. Referred by Dr. Donnice Siad.   Pathology reviewed from 6/13. RIGHT RENAL PELVIS MASS, BIOPSY:  Detached small fragment of high grade urothelial carcinoma.   Started EVP and plan for 4 cycles and repeat imaging.  Assessment & Plan   No orders of the defined types were placed in this encounter.    [name], MD  INTERVAL HISTORY: Patient returns for follow-up.  Oncology History  Bladder cancer (HCC)  03/26/2021 Initial Diagnosis   Bladder cancer (HCC)   12/02/2023 -  Chemotherapy   Patient is on Treatment Plan : UROTHELIAL ADVANCED, METASTATIC ENFORTUMAB D1, D8 + PEMBROLIZUMAB  (200) D1 Q21D     12/08/2023 Cancer Staging   Staging form: Urinary Bladder, AJCC 8th Edition - Clinical: Stage IIIB (cT3, cN2, cM0) - Signed by Chihuahua [name], MD on 12/08/2023 Stage prefix: Initial diagnosis WHO/ISUP grade (low/high): High Grade Histologic grading system: 2 grade system      PHYSICAL EXAMINATION: ECOG PERFORMANCE STATUS: {CHL ONC ECOG PS:[phone]}  There were no vitals filed for this visit. There were no vitals filed for this visit.  GENERAL: alert, no distress and comfortable SKIN: skin color normal and no jaundice or bruising or petechiae on exposed skin EYES: normal, sclera clear OROPHARYNX: no exudate  NECK: No palpable mass LYMPH:  no palpable cervical, axillary lymphadenopathy  LUNGS: clear to auscultation and no wheeze or rales with normal breathing effort HEART: regular rate & rhythm  ABDOMEN: abdomen soft, non-tender and nondistended. Musculoskeletal: no edema NEURO: no focal motor/sensory deficits  Relevant data reviewed during this visit included labs.  New labs ordered.

## 2024-01-20 ENCOUNTER — Inpatient Hospital Stay: Attending: Hematology

## 2024-01-20 ENCOUNTER — Inpatient Hospital Stay

## 2024-01-20 VITALS — BP 142/84 | HR 68 | Temp 97.3°F | Resp 13 | Wt 156.5 lb

## 2024-01-20 DIAGNOSIS — D702 Other drug-induced agranulocytosis: Secondary | ICD-10-CM | POA: Diagnosis not present

## 2024-01-20 DIAGNOSIS — L918 Other hypertrophic disorders of the skin: Secondary | ICD-10-CM | POA: Insufficient documentation

## 2024-01-20 DIAGNOSIS — R21 Rash and other nonspecific skin eruption: Secondary | ICD-10-CM

## 2024-01-20 DIAGNOSIS — Z87891 Personal history of nicotine dependence: Secondary | ICD-10-CM | POA: Insufficient documentation

## 2024-01-20 DIAGNOSIS — R630 Anorexia: Secondary | ICD-10-CM | POA: Diagnosis not present

## 2024-01-20 DIAGNOSIS — R5383 Other fatigue: Secondary | ICD-10-CM | POA: Diagnosis not present

## 2024-01-20 DIAGNOSIS — Z5112 Encounter for antineoplastic immunotherapy: Secondary | ICD-10-CM | POA: Diagnosis present

## 2024-01-20 DIAGNOSIS — L270 Generalized skin eruption due to drugs and medicaments taken internally: Secondary | ICD-10-CM | POA: Diagnosis not present

## 2024-01-20 DIAGNOSIS — C678 Malignant neoplasm of overlapping sites of bladder: Secondary | ICD-10-CM

## 2024-01-20 DIAGNOSIS — Z7952 Long term (current) use of systemic steroids: Secondary | ICD-10-CM | POA: Diagnosis not present

## 2024-01-20 DIAGNOSIS — Z79620 Long term (current) use of immunosuppressive biologic: Secondary | ICD-10-CM | POA: Insufficient documentation

## 2024-01-20 DIAGNOSIS — C651 Malignant neoplasm of right renal pelvis: Secondary | ICD-10-CM | POA: Diagnosis present

## 2024-01-20 DIAGNOSIS — C9141 Hairy cell leukemia, in remission: Secondary | ICD-10-CM | POA: Insufficient documentation

## 2024-01-20 LAB — CMP (CANCER CENTER ONLY)
ALT: 24 U/L (ref 0–44)
AST: 28 U/L (ref 15–41)
Albumin: 3.9 g/dL (ref 3.5–5.0)
Alkaline Phosphatase: 81 U/L (ref 38–126)
Anion gap: 3 — ABNORMAL LOW (ref 5–15)
BUN: 20 mg/dL (ref 8–23)
CO2: 32 mmol/L (ref 22–32)
Calcium: 9.5 mg/dL (ref 8.9–10.3)
Chloride: 104 mmol/L (ref 98–111)
Creatinine: 1.27 mg/dL — ABNORMAL HIGH (ref 0.61–1.24)
GFR, Estimated: 60 mL/min — ABNORMAL LOW (ref >60)
Glucose, Bld: 138 mg/dL — ABNORMAL HIGH (ref 70–99)
Potassium: 3.9 mmol/L (ref 3.5–5.1)
Sodium: 139 mmol/L (ref 135–145)
Total Bilirubin: 0.3 mg/dL (ref 0.0–1.2)
Total Protein: 7 g/dL (ref 6.5–8.1)

## 2024-01-20 LAB — CBC WITH DIFFERENTIAL (CANCER CENTER ONLY)
Abs Immature Granulocytes: 0.01 K/uL (ref 0.00–0.07)
Basophils Absolute: 0 K/uL (ref 0.0–0.1)
Basophils Relative: 0 %
Eosinophils Absolute: 0 K/uL (ref 0.0–0.5)
Eosinophils Relative: 1 %
HCT: 26.3 % — ABNORMAL LOW (ref 39.0–52.0)
Hemoglobin: 9 g/dL — ABNORMAL LOW (ref 13.0–17.0)
Immature Granulocytes: 1 %
Lymphocytes Relative: 53 %
Lymphs Abs: 0.8 K/uL (ref 0.7–4.0)
MCH: 31.9 pg (ref 26.0–34.0)
MCHC: 34.2 g/dL (ref 30.0–36.0)
MCV: 93.3 fL (ref 80.0–100.0)
Monocytes Absolute: 0.3 K/uL (ref 0.1–1.0)
Monocytes Relative: 21 %
Neutro Abs: 0.3 K/uL — CL (ref 1.7–7.7)
Neutrophils Relative %: 24 %
Platelet Count: 171 K/uL (ref 150–400)
RBC: 2.82 MIL/uL — ABNORMAL LOW (ref 4.22–5.81)
RDW: 15.1 % (ref 11.5–15.5)
WBC Count: 1.4 K/uL — ABNORMAL LOW (ref 4.0–10.5)
nRBC: 0 % (ref 0.0–0.2)

## 2024-01-20 LAB — TSH: TSH: 1.69 u[IU]/mL (ref 0.350–4.500)

## 2024-01-20 NOTE — Assessment & Plan Note (Addendum)
 Mostly resolved Triamcinolone  twice daily until resolved. Continue watch for rash

## 2024-01-20 NOTE — Assessment & Plan Note (Addendum)
 C3D1 postpone to next Friday due to severe neutropenia Follow up as scheduled next week Will likely changing to day 1 and day 15 every 4 weeks moving forward. Will communicated with urologic oncology about timing of his future scan and surgery.

## 2024-01-20 NOTE — Assessment & Plan Note (Addendum)
 Stable

## 2024-01-20 NOTE — Assessment & Plan Note (Addendum)
 Severe. Postpone this week. Neutropenic precautions

## 2024-01-21 LAB — T4: T4, Total: 9.2 ug/dL (ref 4.5–12.0)

## 2024-01-26 NOTE — Progress Notes (Unsigned)
 [facility] OFFICE PROGRESS NOTE  Patient Care Team: [name], MD as PCP - General (Internal Medicine)  [name] is a 73 y.o.male with history of NMIBC, Hairy cell leukemia, BPH, IBS being seen at [facility] for [facility] UC of renal pelvis. Referred by Dr. Donnice Siad.   Pathology reviewed from 6/13. RIGHT RENAL PELVIS MASS, BIOPSY:  Detached small fragment of high grade urothelial carcinoma.   Started EVP and plan for 4 cycles and repeat imaging.    Delayed cycle 3-day 1 for 1 week due to severe neutropenia.  Neutropenia resolved.  Will proceed with treatment.  Plan on repeat imaging at [facility] on 10/22 and follow-up with urologic oncology Dr. Maree on 10/29 to determine resectability.  Patient will continue systemic treatment in the meantime. Assessment & Plan Malignant neoplasm of urinary bladder, unspecified site [facility]) C3D1 today  Follow up as scheduled on 10/24 for EV.  Treatment changed to every other week due to recurrent cytopenia. CT on 22nd and Dr. Maree on 10/29. Will communicated with urologic oncology about timing of his future scan and surgery. Rash Mostly resolved Triamcinolone  twice daily until resolved. Continue watch for rash Port-A-Cath in place Watch for infection symptoms and signs. Decreased appetite Trial of marinol 2.5 mg twice daily    [name], MD  INTERVAL HISTORY: Patient returns for follow-up.  Overall feeling well to get treatment today.  Taste is not great.  Appetite is not good during the day.  He is eating better at night.  No new rash, fever, chills, coughing, shortness of breath, diarrhea.  Chronic burning sensation urination has not changed.  No dysuria, hematuria.  Oncology History  Bladder cancer (HCC)  03/26/2021 Initial Diagnosis   Bladder cancer (HCC)   12/02/2023 -  Chemotherapy   Patient is on Treatment Plan : UROTHELIAL ADVANCED, METASTATIC ENFORTUMAB D1, D8 + PEMBROLIZUMAB  (200) D1 Q21D     12/08/2023  Cancer Staging   Staging form: Urinary Bladder, AJCC 8th Edition - Clinical: Stage IIIB (cT3, cN2, cM0) - Signed by Chihuahua [name], MD on 12/08/2023 Stage prefix: Initial diagnosis WHO/ISUP grade (low/high): High Grade Histologic grading system: 2 grade system      PHYSICAL EXAMINATION: ECOG PERFORMANCE STATUS: 2 - Symptomatic, <50% confined to bed  Vitals:   01/27/24 1150  BP: 136/71  Pulse: 67  Resp: 20  Temp: 97.9 F (36.6 C)  SpO2: 100%   Filed Weights   01/27/24 1150  Weight: 157 lb 12.8 oz (71.6 kg)    GENERAL: alert, no distress and comfortable SKIN: skin color normal and no jaundice  No erythema around Mediport EYES: , sclera clear LUNGS: clear to auscultation and no wheeze or rales with normal breathing effort HEART: regular rate & rhythm  ABDOMEN: abdomen soft, non-tender and nondistended.   Relevant data reviewed during this visit included labs.

## 2024-01-27 ENCOUNTER — Inpatient Hospital Stay

## 2024-01-27 ENCOUNTER — Inpatient Hospital Stay (HOSPITAL_BASED_OUTPATIENT_CLINIC_OR_DEPARTMENT_OTHER)

## 2024-01-27 ENCOUNTER — Inpatient Hospital Stay: Admitting: Dietician

## 2024-01-27 VITALS — BP 136/71 | HR 67 | Temp 97.9°F | Resp 20 | Wt 157.8 lb

## 2024-01-27 DIAGNOSIS — Z95828 Presence of other vascular implants and grafts: Secondary | ICD-10-CM | POA: Diagnosis not present

## 2024-01-27 DIAGNOSIS — C679 Malignant neoplasm of bladder, unspecified: Secondary | ICD-10-CM | POA: Diagnosis not present

## 2024-01-27 DIAGNOSIS — C678 Malignant neoplasm of overlapping sites of bladder: Secondary | ICD-10-CM

## 2024-01-27 DIAGNOSIS — R630 Anorexia: Secondary | ICD-10-CM | POA: Diagnosis not present

## 2024-01-27 DIAGNOSIS — R21 Rash and other nonspecific skin eruption: Secondary | ICD-10-CM | POA: Diagnosis not present

## 2024-01-27 DIAGNOSIS — Z5112 Encounter for antineoplastic immunotherapy: Secondary | ICD-10-CM | POA: Diagnosis not present

## 2024-01-27 LAB — CBC WITH DIFFERENTIAL (CANCER CENTER ONLY)
Abs Immature Granulocytes: 0 K/uL (ref 0.00–0.07)
Basophils Absolute: 0 K/uL (ref 0.0–0.1)
Basophils Relative: 0 %
Eosinophils Absolute: 0 K/uL (ref 0.0–0.5)
Eosinophils Relative: 0 %
HCT: 27.5 % — ABNORMAL LOW (ref 39.0–52.0)
Hemoglobin: 9.2 g/dL — ABNORMAL LOW (ref 13.0–17.0)
Immature Granulocytes: 0 %
Lymphocytes Relative: 33 %
Lymphs Abs: 1.1 K/uL (ref 0.7–4.0)
MCH: 31.7 pg (ref 26.0–34.0)
MCHC: 33.5 g/dL (ref 30.0–36.0)
MCV: 94.8 fL (ref 80.0–100.0)
Monocytes Absolute: 0.5 K/uL (ref 0.1–1.0)
Monocytes Relative: 14 %
Neutro Abs: 1.8 K/uL (ref 1.7–7.7)
Neutrophils Relative %: 53 %
Platelet Count: 178 K/uL (ref 150–400)
RBC: 2.9 MIL/uL — ABNORMAL LOW (ref 4.22–5.81)
RDW: 16.1 % — ABNORMAL HIGH (ref 11.5–15.5)
WBC Count: 3.3 K/uL — ABNORMAL LOW (ref 4.0–10.5)
nRBC: 0 % (ref 0.0–0.2)

## 2024-01-27 LAB — CMP (CANCER CENTER ONLY)
ALT: 11 U/L (ref 0–44)
AST: 17 U/L (ref 15–41)
Albumin: 3.8 g/dL (ref 3.5–5.0)
Alkaline Phosphatase: 70 U/L (ref 38–126)
Anion gap: 3 — ABNORMAL LOW (ref 5–15)
BUN: 16 mg/dL (ref 8–23)
CO2: 31 mmol/L (ref 22–32)
Calcium: 9.4 mg/dL (ref 8.9–10.3)
Chloride: 106 mmol/L (ref 98–111)
Creatinine: 1.17 mg/dL (ref 0.61–1.24)
GFR, Estimated: >60 mL/min (ref >60)
Glucose, Bld: 142 mg/dL — ABNORMAL HIGH (ref 70–99)
Potassium: 3.8 mmol/L (ref 3.5–5.1)
Sodium: 140 mmol/L (ref 135–145)
Total Bilirubin: 0.3 mg/dL (ref 0.0–1.2)
Total Protein: 6.7 g/dL (ref 6.5–8.1)

## 2024-01-27 LAB — TSH: TSH: 1.48 u[IU]/mL (ref 0.350–4.500)

## 2024-01-27 MED ORDER — PROCHLORPERAZINE MALEATE 10 MG PO TABS
10.0000 mg | ORAL_TABLET | Freq: Once | ORAL | Status: AC
  Administered 2024-01-27: 10 mg via ORAL
  Filled 2024-01-27: qty 1

## 2024-01-27 MED ORDER — DRONABINOL 2.5 MG PO CAPS: 2.5000 mg | ORAL_CAPSULE | Freq: Two times a day (BID) | ORAL | 0 refills | Status: DC

## 2024-01-27 MED ORDER — SODIUM CHLORIDE 0.9 % IV SOLN: INTRAVENOUS | Status: DC

## 2024-01-27 MED ORDER — SODIUM CHLORIDE 0.9 % IV SOLN
200.0000 mg | Freq: Once | INTRAVENOUS | Status: AC
  Administered 2024-01-27: 200 mg via INTRAVENOUS
  Filled 2024-01-27: qty 200

## 2024-01-27 MED ORDER — SODIUM CHLORIDE 0.9 % IV SOLN
1.2500 mg/kg | Freq: Once | INTRAVENOUS | Status: AC
  Administered 2024-01-27: 90 mg via INTRAVENOUS
  Filled 2024-01-27: qty 9

## 2024-01-27 NOTE — Assessment & Plan Note (Addendum)
 Watch for infection symptoms and signs.

## 2024-01-27 NOTE — Assessment & Plan Note (Addendum)
 Mostly resolved Triamcinolone  twice daily until resolved. Continue watch for rash

## 2024-01-27 NOTE — Patient Instructions (Signed)
 [facility] - A DEPT OF [facility]  Discharge Instructions: Thank you for choosing [facility] to provide your oncology and hematology care.   If you have a lab appointment with the Cancer Center, please go directly to the Cancer Center and check in at the registration area.   Wear comfortable clothing and clothing appropriate for easy access to any Portacath or PICC line.   We strive to give you quality time with your provider. You may need to reschedule your appointment if you arrive late (15 or more minutes).  Arriving late affects you and other patients whose appointments are after yours.  Also, if you miss three or more appointments without notifying the office, you may be dismissed from the clinic at the provider's discretion.      For prescription refill requests, have your pharmacy contact our office and allow 72 hours for refills to be completed.    Today you received the following chemotherapy and/or immunotherapy agents: Padcev, Keytruda.      To help prevent nausea and vomiting after your treatment, we encourage you to take your nausea medication as directed.  BELOW ARE SYMPTOMS THAT SHOULD BE REPORTED IMMEDIATELY: *FEVER GREATER THAN 100.4 F (38 C) OR HIGHER *CHILLS OR SWEATING *NAUSEA AND VOMITING THAT IS NOT CONTROLLED WITH YOUR NAUSEA MEDICATION *UNUSUAL SHORTNESS OF BREATH *UNUSUAL BRUISING OR BLEEDING *URINARY PROBLEMS (pain or burning when urinating, or frequent urination) *BOWEL PROBLEMS (unusual diarrhea, constipation, pain near the anus) TENDERNESS IN MOUTH AND THROAT WITH OR WITHOUT PRESENCE OF ULCERS (sore throat, sores in mouth, or a toothache) UNUSUAL RASH, SWELLING OR PAIN  UNUSUAL VAGINAL DISCHARGE OR ITCHING   Items with * indicate a potential emergency and should be followed up as soon as possible or go to the Emergency Department if any problems should occur.  Please show the CHEMOTHERAPY ALERT CARD or  IMMUNOTHERAPY ALERT CARD at check-in to the Emergency Department and triage nurse.  Should you have questions after your visit or need to cancel or reschedule your appointment, please contact [facility] - A DEPT OF [facility] [place]  Dept: [phone]  and follow the prompts.  Office hours are 8:00 a.m. to 4:30 p.m. Monday - Friday. Please note that voicemails left after 4:00 p.m. may not be returned until the following business day.  We are closed weekends and major holidays. You have access to a nurse at all times for urgent questions. Please call the main number to the clinic Dept: [phone] and follow the prompts.   For any non-urgent questions, you may also contact your provider using MyChart. We now offer e-Visits for anyone 62 and older to request care online for non-urgent symptoms. For details visit [email or web address].   Also download the MyChart app! Go to the app store, search "MyChart", open the app, select [place], and log in with your MyChart username and password.

## 2024-01-27 NOTE — Progress Notes (Signed)
 Nutrition Follow-up:  Pt with urothelial cancer. He is receiving neoadjuvant Padcev  + keytruda . Pt under care of Dr. Tina   Met with patient in infusion. He reports altered taste persists. Foods just taste like nothing. This makes intake challenging. Patient continues supplementing with CIB 3x/day. He is mixing this with whole milk instead of Ensure as it dissolves better. Patient has not tried making shakes. He denies nausea, vomiting, diarrhea, constipation.    Medications: reviewed   Labs: glucose 142  Anthropometrics: Wt 157 lb 12.8 oz today - stable   9/19 - 157 lb 9/12 - 157 lb 4.8 oz    NUTRITION DIAGNOSIS: Inadequate oral intake stable    INTERVENTION:  Reviewed strategies for taste changes - encourage baking soda salt water  gargles (additional copy of handout provided) Continue supplementing with CIB mixed with whole milk - 3/day Suggested adding peanut butter + ice cream for high calorie shake - he likes this idea Support and encouragement     MONITORING, EVALUATION, GOAL: wt trends, intake   NEXT VISIT: Friday November 21 during infusion

## 2024-01-27 NOTE — Assessment & Plan Note (Addendum)
 Trial of marinol 2.5 mg twice daily

## 2024-01-27 NOTE — Assessment & Plan Note (Addendum)
 C3D1 today  Follow up as scheduled on 10/24 for EV.  Treatment changed to every other week due to recurrent cytopenia. CT on 22nd and Dr. Maree on 10/29. Will communicated with urologic oncology about timing of his future scan and surgery.

## 2024-01-28 LAB — T4: T4, Total: 9.3 ug/dL (ref 4.5–12.0)

## 2024-02-09 NOTE — Progress Notes (Unsigned)
 [facility] Telephone:([phone]) [phone]   Fax:([phone]  PROGRESS NOTE  Patient Care Team: [name], MD as PCP - General (Internal Medicine)  DIAGNOSIS: Bladder cancer  Oncology History  Bladder cancer [facility])  03/26/2021 Initial Diagnosis   Bladder cancer (HCC)   12/02/2023 -  Chemotherapy   Patient is on Treatment Plan : UROTHELIAL ADVANCED, METASTATIC ENFORTUMAB D1, D8 + PEMBROLIZUMAB  (200) D1 Q21D     12/08/2023 Cancer Staging   Staging form: Urinary Bladder, AJCC 8th Edition - Clinical: Stage IIIB (cT3, cN2, cM0) - Signed by Tina Pauletta BROCKS, MD on 12/08/2023 Stage prefix: Initial diagnosis WHO/ISUP grade (low/high): High Grade Histologic grading system: 2 grade system    CURRENT TREATMENT: Enfortumab plus Pembrolizumab   INTERVAL HISTORY:  Cameron Wu 73 y.o. malereturns for a follow up for continued management of bladder cancer. He is due for Cycle 3, Day 8 of treatment today.   On exam today, Cameron Wu reports***Rest of the 10 point ROS is below.  MEDICAL HISTORY:  Past Medical History:  Diagnosis Date   Abnormal liver function tests 05/03/2011   Arthritis    Bladder cancer (HCC) 02/2021   high grade nonmuscle invasive bladder cancer   BPH (benign prostatic hypertrophy) 05/03/2011   Chronic kidney disease    Complication of anesthesia    coughs alot and has a very dry, scratchy throat per patient   Elevated transaminase level    GERD (gastroesophageal reflux disease)    Hairy cell leukemia, in remission (HCC) 1992   remission since 2008   History of kidney stones    Hypertension    IBS (irritable bowel syndrome)    Irritable bowel syndrome    2 yrs ago, states he had inflammation around anus. Biopsy: inclusive   Lumbar foraminal stenosis 12/31/2019   Lumbar radiculopathy 11/15/2019   Sleep apnea     SURGICAL HISTORY: Past Surgical History:  Procedure Laterality Date   BACK SURGERY     BONE MARROW BIOPSY     2007 and 1992    CATARACT EXTRACTION W/PHACO Right 08/15/2020   Procedure: CATARACT EXTRACTION PHACO AND INTRAOCULAR LENS PLACEMENT RIGHT EYE;  Surgeon: Harrie Agent, MD;  Location: [facility];  Service: Ophthalmology;  Laterality: Right;  right CDE=7.75   COLONOSCOPY  04/19/2006   with polyps   COLONOSCOPY  09/30/2011   Procedure: COLONOSCOPY;  Surgeon: Claudis RAYMOND Rivet, MD;  Location: [facility];  Service: Endoscopy;  Laterality: N/A;  730   COLONOSCOPY N/A 01/11/2019   Procedure: COLONOSCOPY;  Surgeon: Rivet Claudis RAYMOND, MD;  Location: [facility];  Service: Endoscopy;  Laterality: N/A;  100-office move to 9:30am   CYSTOSCOPY W/ RETROGRADES Bilateral 02/23/2021   Procedure: CYSTOSCOPY WITH RETROGRADE PYELOGRAM;  Surgeon: Matilda Senior, MD;  Location: [facility];  Service: Urology;  Laterality: Bilateral;   CYSTOSCOPY W/ RETROGRADES Bilateral 07/05/2022   Procedure: CYSTOSCOPY WITH RETROGRADE PYELOGRAM;  Surgeon: Matilda Senior, MD;  Location: [facility];  Service: Urology;  Laterality: Bilateral;   CYSTOSCOPY W/ RETROGRADES Right 09/30/2023   Procedure: CYSTOSCOPY, WITH RETROGRADE PYELOGRAM, BIOPSY OF MASS;  Surgeon: Selma Donnice SAUNDERS, MD;  Location: [facility];  Service: Urology;  Laterality: Right;   CYSTOSCOPY WITH BIOPSY N/A 07/05/2022   Procedure: CYSTOSCOPY WITH BIOPSY;  Surgeon: Matilda Senior, MD;  Location: [facility];  Service: Urology;  Laterality: N/A;  30 MINS   CYSTOSCOPY WITH URETEROSCOPY AND STENT PLACEMENT Right 09/30/2023   Procedure: CYSTOURETEROSCOPY, WITH STENT EXCHANGE;  Surgeon: Selma Donnice SAUNDERS, MD;  Location: [facility];  Service: Urology;  Laterality: Right;   HX COLON POLYPS     IR IMAGING GUIDED PORT INSERTION  11/24/2023   TONSILLECTOMY     childhood   TOTAL HIP ARTHROPLASTY Right 12/19/2022   Procedure: RIGHT POSTERIOR TOTAL HIP ARTHROPLASTY;  Surgeon: Edna Toribio LABOR, MD;  Location: [facility];  Service: Orthopedics;  Laterality:  Right;   TRANSFORAMINAL LUMBAR INTERBODY FUSION W/ MIS 1 LEVEL Right 11/18/2020   Procedure: Right Lumbar Five-Sacral One Minimally invasive transforaminal lumbar interbody fusion;  Surgeon: Cheryle Debby LABOR, MD;  Location: [facility];  Service: Neurosurgery;  Laterality: Right;   TRANSURETHRAL RESECTION OF BLADDER TUMOR N/A 03/26/2021   Procedure: REPEAT TRANSURETHRAL RESECTION OF BLADDER TUMOR (TURBT)/ POST OPERATIVE INSTILLATION OF GEMCITABINE ;  Surgeon: Matilda Senior, MD;  Location: [facility];  Service: Urology;  Laterality: N/A;   TRANSURETHRAL RESECTION OF BLADDER TUMOR WITH MITOMYCIN -C N/A 02/23/2021   Procedure: TRANSURETHRAL RESECTION OF BLADDER TUMOR;  Surgeon: Matilda Senior, MD;  Location: [facility];  Service: Urology;  Laterality: N/A;   TRANSURETHRAL RESECTION OF BLADDER TUMOR WITH MITOMYCIN -C N/A 10/29/2021   Procedure: TRANSURETHRAL RESECTION OF BLADDER TUMOR WITH GEMCITABINE ;  Surgeon: Matilda Senior, MD;  Location: [facility];  Service: Urology;  Laterality: N/A;    SOCIAL HISTORY: Social History   Socioeconomic History   Marital status: Married    Spouse name: Not on file   Number of children: Not on file   Years of education: Not on file   Highest education level: Not on file  Occupational History   Not on file  Tobacco Use   Smoking status: Former    Current packs/day: 0.00    Types: Cigarettes    Quit date: 12/18/2005    Years since quitting: 18.1   Smokeless tobacco: Never  Vaping Use   Vaping status: Never Used  Substance and Sexual Activity   Alcohol use: Yes    Alcohol/week: 4.0 standard drinks of alcohol    Types: 4 Cans of beer per week    Comment: social   Drug use: No   Sexual activity: Not on file  Other Topics Concern   Not on file  Social History Narrative   Not on file   Social Drivers of Health   Financial Resource Strain: Not on file  Food Insecurity: No Food Insecurity  (10/24/2023)   Hunger Vital Sign    Worried About Running Out of Food in the Last Year: Never true    Ran Out of Food in the Last Year: Never true  Transportation Needs: No Transportation Needs (10/24/2023)   PRAPARE - Administrator, Civil Service (Medical): No    Lack of Transportation (Non-Medical): No  Physical Activity: Not on file  Stress: Not on file  Social Connections: Not on file  Intimate Partner Violence: Not At Risk (10/24/2023)   Humiliation, Afraid, Rape, and Kick questionnaire    Fear of Current or Ex-Partner: No    Emotionally Abused: No    Physically Abused: No    Sexually Abused: No    FAMILY HISTORY: No family history on file.  ALLERGIES:  is allergic to penicillins and pollen extract.  MEDICATIONS:  Current Outpatient Medications  Medication Sig Dispense Refill   acetaminophen  (TYLENOL ) 500 MG tablet Take 1,000 mg by mouth every 8 (eight) hours as needed for moderate pain (pain score 4-6).     Calcium  Carb-Cholecalciferol (CALCIUM  + VITAMIN  D3 PO) Take 1 tablet by mouth daily with lunch.     Carboxymethylcell-Glycerin  PF 0.5-0.9 % SOLN Place 2 drops into both eyes 4 (four) times daily. 1 each 11   docusate sodium  (COLACE) 100 MG capsule Take 1 capsule (100 mg total) by mouth daily as needed for up to 30 doses. 30 capsule 0   donepezil  (ARICEPT ) 10 MG tablet Take 10 mg by mouth daily.     doxylamine , Sleep, (UNISOM ) 25 MG tablet Take 25 mg by mouth at bedtime as needed for sleep.     dronabinol (MARINOL) 2.5 MG capsule Take 1 capsule (2.5 mg total) by mouth 2 (two) times daily before a meal. 60 capsule 0   finasteride  (PROSCAR ) 5 MG tablet Take 1 tablet (5 mg total) by mouth every other day. 90 tablet 3   Ginkgo Biloba (GINKOBA PO) Take 120 mg by mouth daily.     Homeopathic Products (LEG CRAMPS PM SL) Place 2 tablets under the tongue daily as needed (leg cramps).     levocetirizine (XYZAL) 5 MG tablet Take 5 mg by mouth at bedtime as needed for  allergies.     lidocaine -prilocaine  (EMLA ) cream Apply to affected area once 30 g 3   loperamide (IMODIUM) 2 MG capsule Take 2 mg by mouth as needed for diarrhea or loose stools.     Menthol , Topical Analgesic, (BIOFREEZE COOL THE PAIN) 4 % GEL Apply 1 Application topically as needed (pain).     Multiple Vitamins-Minerals (ONE-A-DAY MENS 50+) TABS Take 1 tablet by mouth daily.     Naphazoline-Pheniramine (OPCON-A) 0.027-0.315 % SOLN Place 1 drop into both eyes as needed (eye irritation).     ondansetron  (ZOFRAN ) 8 MG tablet Take 1 tablet (8 mg total) by mouth every 8 (eight) hours as needed for nausea or vomiting. 30 tablet 1   pantoprazole (PROTONIX) 40 MG tablet Take 40 mg by mouth every morning.     prochlorperazine  (COMPAZINE ) 10 MG tablet Take 1 tablet (10 mg total) by mouth every 6 (six) hours as needed for nausea or vomiting. 30 tablet 1   rosuvastatin  (CRESTOR ) 10 MG tablet Take 10 mg by mouth at bedtime.     Saline (ARY NASAL MIST ALLERGY/SINUS) 2.65 % SOLN Place 1 spray into the nose as needed (congestion).     simethicone  (MYLICON) 125 MG chewable tablet Chew 125 mg by mouth 2 (two) times daily as needed for flatulence.     tamsulosin  (FLOMAX ) 0.4 MG CAPS capsule Take 1 capsule (0.4 mg total) by mouth every other day. 90 capsule 3   triamcinolone  ointment (KENALOG ) 0.5 % Apply 1 Application topically 2 (two) times daily. 30 g 0   Vibegron  75 MG TABS Take 1 tablet (75 mg total) by mouth at bedtime. 30 tablet 1   No current facility-administered medications for this visit.   Facility-Administered Medications Ordered in Other Visits  Medication Dose Route Frequency Provider Last Rate Last Admin   gemcitabine  (GEMZAR ) chemo syringe for bladder instillation 2,000 mg  2,000 mg Bladder Instillation Once Dahlstedt, Stephen, MD        REVIEW OF SYSTEMS:   Constitutional: ( - ) fevers, ( - )  chills , ( - ) night sweats Eyes: ( - ) blurriness of vision, ( - ) double vision, ( - ) watery  eyes Ears, nose, mouth, throat, and face: ( - ) mucositis, ( - ) sore throat Respiratory: ( - ) cough, ( - ) dyspnea, ( - ) wheezes Cardiovascular: ( - )  palpitation, ( - ) chest discomfort, ( - ) lower extremity swelling Gastrointestinal:  ( - ) nausea, ( - ) heartburn, ( - ) change in bowel habits Skin: ( - ) abnormal skin rashes Lymphatics: ( - ) new lymphadenopathy, ( - ) easy bruising Neurological: ( - ) numbness, ( - ) tingling, ( - ) new weaknesses Behavioral/Psych: ( - ) mood change, ( - ) new changes  All other systems were reviewed with the patient and are negative.  PHYSICAL EXAMINATION: ECOG PERFORMANCE STATUS: 1 - Symptomatic but completely ambulatory  There were no vitals filed for this visit.  There were no vitals filed for this visit.   GENERAL: well appearing male in NAD  SKIN: skin color, texture, turgor are normal, no rashes or significant lesions EYES: conjunctiva are pink and non-injected, sclera clear OROPHARYNX: no exudate, no erythema; lips, buccal mucosa, and tongue normal  LUNGS: clear to auscultation and percussion with normal breathing effort HEART: regular rate & rhythm and no murmurs and no lower extremity edema Musculoskeletal: no cyanosis of digits and no clubbing  PSYCH: alert & oriented x 3, fluent speech NEURO: no focal motor/sensory deficits  LABORATORY DATA:  I have reviewed the data as listed    Latest Ref Rng & Units 01/27/2024   11:15 AM 01/20/2024   10:42 AM 01/06/2024    8:08 AM  CBC  WBC 4.0 - 10.5 K/uL 3.3  1.4  3.4   Hemoglobin 13.0 - 17.0 g/dL 9.2  9.0  9.9   Hematocrit 39.0 - 52.0 % 27.5  26.3  28.6   Platelets 150 - 400 K/uL 178  171  110        Latest Ref Rng & Units 01/27/2024   11:15 AM 01/20/2024   10:42 AM 01/06/2024    8:08 AM  CMP  Glucose 70 - 99 mg/dL 857  861  883   BUN 8 - 23 mg/dL 16  20  20    Creatinine 0.61 - 1.24 mg/dL 8.82  8.72  8.73   Sodium 135 - 145 mmol/L 140  139  138   Potassium 3.5 - 5.1 mmol/L  3.8  3.9  3.8   Chloride 98 - 111 mmol/L 106  104  101   CO2 22 - 32 mmol/L 31  32  31   Calcium  8.9 - 10.3 mg/dL 9.4  9.5  9.1   Total Protein 6.5 - 8.1 g/dL 6.7  7.0  6.9   Total Bilirubin 0.0 - 1.2 mg/dL 0.3  0.3  0.5   Alkaline Phos 38 - 126 U/L 70  81  68   AST 15 - 41 U/L 17  28  19    ALT 0 - 44 U/L 11  24  13    RADIOGRAPHIC STUDIES: I have personally reviewed the radiological images as listed and agreed with the findings in the report. No results found.  ASSESSMENT & PLAN HOBY KAWAI is a 73 y.o. male who presents to the clinic for bladder cancer.   #Malignant neoplasm of urinary bladder: --Started Enfortumab plus Pembrolizumab  on 12/02/2023 PLAN: --Due for Cycle 3, Day 8 with Enfortumab today --Labs from today were reviewed and adequate for treatment. *** --Proceed with treatment without any dose modifications --RTC in 2 weeks with labs/follow up prior to Cycle 4, Day 1.   #Overactive bladder:  --Currently on vibegron  75 mg nightly  #Drug induced skin rash: --currently using moisturizer 4x/day, sunscreen when outside, kenalog  cream BID  #Positional dizziness: --Orthostatic  blood pressures obtained on 01/06/24 showed 123/77 (supine), 107/68 (sitting) and 95/71 (standing).  --Advised to pause 10-15 secs between positions, drink 2 L of water  per day, add salt to diet.     No orders of the defined types were placed in this encounter.   All questions were answered. The patient knows to call the clinic with any problems, questions or concerns.  I have spent a total of 30 minutes minutes of face-to-face and non-face-to-face time, preparing to see the patient, performing a medically appropriate examination, counseling and educating the patient, ordering medications/tests/procedures, documenting clinical information in the electronic health record, independently interpreting results and communicating results to the patient, and care coordination.   Johnston Police,  PA-C Department of Hematology/Oncology [facility] Phone: [phone]

## 2024-02-10 ENCOUNTER — Inpatient Hospital Stay

## 2024-02-10 ENCOUNTER — Inpatient Hospital Stay (HOSPITAL_BASED_OUTPATIENT_CLINIC_OR_DEPARTMENT_OTHER): Admitting: Physician Assistant

## 2024-02-10 VITALS — BP 118/68 | HR 76 | Temp 97.8°F | Resp 18 | Ht 72.0 in | Wt 157.5 lb

## 2024-02-10 DIAGNOSIS — C678 Malignant neoplasm of overlapping sites of bladder: Secondary | ICD-10-CM | POA: Diagnosis not present

## 2024-02-10 DIAGNOSIS — D702 Other drug-induced agranulocytosis: Secondary | ICD-10-CM

## 2024-02-10 DIAGNOSIS — Z5112 Encounter for antineoplastic immunotherapy: Secondary | ICD-10-CM | POA: Diagnosis not present

## 2024-02-10 LAB — CMP (CANCER CENTER ONLY)
ALT: 14 U/L (ref 0–44)
AST: 23 U/L (ref 15–41)
Albumin: 3.9 g/dL (ref 3.5–5.0)
Alkaline Phosphatase: 75 U/L (ref 38–126)
Anion gap: 5 (ref 5–15)
BUN: 19 mg/dL (ref 8–23)
CO2: 29 mmol/L (ref 22–32)
Calcium: 9.2 mg/dL (ref 8.9–10.3)
Chloride: 105 mmol/L (ref 98–111)
Creatinine: 1.16 mg/dL (ref 0.61–1.24)
GFR, Estimated: >60 mL/min (ref >60)
Glucose, Bld: 138 mg/dL — ABNORMAL HIGH (ref 70–99)
Potassium: 3.6 mmol/L (ref 3.5–5.1)
Sodium: 139 mmol/L (ref 135–145)
Total Bilirubin: 0.4 mg/dL (ref 0.0–1.2)
Total Protein: 6.8 g/dL (ref 6.5–8.1)

## 2024-02-10 LAB — CBC WITH DIFFERENTIAL (CANCER CENTER ONLY)
Abs Immature Granulocytes: 0.01 K/uL (ref 0.00–0.07)
Basophils Absolute: 0 K/uL (ref 0.0–0.1)
Basophils Relative: 0 %
Eosinophils Absolute: 0 K/uL (ref 0.0–0.5)
Eosinophils Relative: 0 %
HCT: 27 % — ABNORMAL LOW (ref 39.0–52.0)
Hemoglobin: 9.1 g/dL — ABNORMAL LOW (ref 13.0–17.0)
Immature Granulocytes: 0 %
Lymphocytes Relative: 32 %
Lymphs Abs: 0.8 K/uL (ref 0.7–4.0)
MCH: 32 pg (ref 26.0–34.0)
MCHC: 33.7 g/dL (ref 30.0–36.0)
MCV: 95.1 fL (ref 80.0–100.0)
Monocytes Absolute: 0.2 K/uL (ref 0.1–1.0)
Monocytes Relative: 8 %
Neutro Abs: 1.5 K/uL — ABNORMAL LOW (ref 1.7–7.7)
Neutrophils Relative %: 60 %
Platelet Count: 120 K/uL — ABNORMAL LOW (ref 150–400)
RBC: 2.84 MIL/uL — ABNORMAL LOW (ref 4.22–5.81)
RDW: 16.2 % — ABNORMAL HIGH (ref 11.5–15.5)
WBC Count: 2.5 K/uL — ABNORMAL LOW (ref 4.0–10.5)
nRBC: 0 % (ref 0.0–0.2)

## 2024-02-10 MED ORDER — SODIUM CHLORIDE 0.9 % IV SOLN: INTRAVENOUS | Status: DC

## 2024-02-10 MED ORDER — SODIUM CHLORIDE 0.9 % IV SOLN
1.2500 mg/kg | Freq: Once | INTRAVENOUS | Status: AC
  Administered 2024-02-10: 90 mg via INTRAVENOUS
  Filled 2024-02-10: qty 9

## 2024-02-10 MED ORDER — PROCHLORPERAZINE MALEATE 10 MG PO TABS
10.0000 mg | ORAL_TABLET | Freq: Once | ORAL | Status: AC
  Administered 2024-02-10: 10 mg via ORAL
  Filled 2024-02-10: qty 1

## 2024-02-16 ENCOUNTER — Other Ambulatory Visit: Payer: Self-pay

## 2024-02-16 ENCOUNTER — Inpatient Hospital Stay: Admission: RE | Admit: 2024-02-16 | Discharge: 2024-02-16 | Disposition: A | Payer: Self-pay | Source: Ambulatory Visit

## 2024-02-16 DIAGNOSIS — C678 Malignant neoplasm of overlapping sites of bladder: Secondary | ICD-10-CM

## 2024-02-16 DIAGNOSIS — D696 Thrombocytopenia, unspecified: Secondary | ICD-10-CM

## 2024-02-17 ENCOUNTER — Ambulatory Visit

## 2024-02-17 ENCOUNTER — Ambulatory Visit: Admitting: Nurse Practitioner

## 2024-02-17 ENCOUNTER — Other Ambulatory Visit

## 2024-02-23 NOTE — Progress Notes (Unsigned)
 [facility] OFFICE PROGRESS NOTE  Patient Care Team: [name], MD as PCP - General (Internal Medicine)  Cameron Wu is a 73 y.o.male with history of NMIBC, Hairy cell leukemia, BPH, IBS being seen at [facility] for [facility] of renal pelvis. Referred by Dr. Donnice Siad.    Pathology reviewed from 6/13. RIGHT RENAL PELVIS MASS, BIOPSY:  Detached small fragment of high grade urothelial carcinoma.    Started EVP and plan for 4 cycles and repeat imaging. Outside report suggests progression. We have no gotten the imaging. Discussed obtain NGS to see if other actionable mutations for treatment.  Long discussion today on imaging findings, prognosis, treatment option. Recommend NGS testing. We will continue EV/P for 2 more cycles given he has few delays and CT was earlier than expected. Discussed palliative care consult as well. All treatment will be palliative in nature and not curative. Surgery is not an option at this time.  Assessment & Plan Malignant neoplasm of overlapping sites of bladder [facility]) C4D1 today  Obtain NGS Return every 2 weeks Palliative care referral for ACP Bladder irritation Trial of pyridium  up to twice daily as needed. Dysuria UA and UCX Goals of care, counseling/discussion Discussed as above.  Orders Placed This Encounter  Procedures   Culture, Urine    Standing Status:   Future    Expiration Date:   02/23/2025   Urinalysis, Complete w Microscopic    Standing Status:   Future    Expiration Date:   02/23/2025   Amb Referral to Palliative Care    Referral Priority:   Routine    Referral Type:   Consultation    Referral Reason:   Advance Care Planning    Number of Visits Requested:   1     Cameron JAYSON Chihuahua, MD  INTERVAL HISTORY: Patient returns for follow-up. No clinical change. Burning urination that is not UTI.   Oncology History  Bladder cancer (HCC)  03/26/2021 Initial Diagnosis   Bladder cancer (HCC)   12/02/2023 -  Chemotherapy    Patient is on Treatment Plan : UROTHELIAL ADVANCED, METASTATIC ENFORTUMAB D1, D8 + PEMBROLIZUMAB  (200) D1 Q21D     12/08/2023 Cancer Staging   Staging form: Urinary Bladder, AJCC 8th Edition - Clinical: Stage IIIB (cT3, cN2, cM0) - Signed by Wu Cameron JAYSON, MD on 12/08/2023 Stage prefix: Initial diagnosis WHO/ISUP grade (low/high): High Grade Histologic grading system: 2 grade system      PHYSICAL EXAMINATION: ECOG PERFORMANCE STATUS: 2 - Symptomatic, <50% confined to bed  Vitals:   02/24/24 1034  BP: 126/66  Pulse: 72  Resp: 17  Temp: 97.7 F (36.5 C)  SpO2: 96%   Filed Weights   02/24/24 1034  Weight: 159 lb 1.6 oz (72.2 kg)    GENERAL: alert, no distress and comfortable SKIN: skin color normal and no jaundice  LUNGS:  normal breathing effort   Relevant data reviewed during this visit included labs.  New labs ordered.

## 2024-02-23 NOTE — Assessment & Plan Note (Addendum)
 C4D1 today  Obtain NGS Return every 2 weeks Palliative care referral for ACP

## 2024-02-24 ENCOUNTER — Other Ambulatory Visit: Payer: Self-pay | Admitting: *Deleted

## 2024-02-24 ENCOUNTER — Inpatient Hospital Stay: Attending: Hematology

## 2024-02-24 ENCOUNTER — Ambulatory Visit: Admitting: Nurse Practitioner

## 2024-02-24 ENCOUNTER — Inpatient Hospital Stay

## 2024-02-24 ENCOUNTER — Other Ambulatory Visit: Payer: Self-pay

## 2024-02-24 ENCOUNTER — Inpatient Hospital Stay (HOSPITAL_BASED_OUTPATIENT_CLINIC_OR_DEPARTMENT_OTHER)

## 2024-02-24 VITALS — BP 126/66 | HR 72 | Temp 97.7°F | Resp 17 | Ht 72.0 in | Wt 159.1 lb

## 2024-02-24 VITALS — BP 118/75 | HR 73 | Temp 98.0°F

## 2024-02-24 DIAGNOSIS — Z87891 Personal history of nicotine dependence: Secondary | ICD-10-CM | POA: Diagnosis not present

## 2024-02-24 DIAGNOSIS — N3289 Other specified disorders of bladder: Secondary | ICD-10-CM

## 2024-02-24 DIAGNOSIS — C678 Malignant neoplasm of overlapping sites of bladder: Secondary | ICD-10-CM

## 2024-02-24 DIAGNOSIS — C9141 Hairy cell leukemia, in remission: Secondary | ICD-10-CM | POA: Insufficient documentation

## 2024-02-24 DIAGNOSIS — Z7189 Other specified counseling: Secondary | ICD-10-CM | POA: Diagnosis not present

## 2024-02-24 DIAGNOSIS — Z5112 Encounter for antineoplastic immunotherapy: Secondary | ICD-10-CM | POA: Insufficient documentation

## 2024-02-24 DIAGNOSIS — R300 Dysuria: Secondary | ICD-10-CM | POA: Diagnosis not present

## 2024-02-24 DIAGNOSIS — C651 Malignant neoplasm of right renal pelvis: Secondary | ICD-10-CM | POA: Insufficient documentation

## 2024-02-24 DIAGNOSIS — Z7952 Long term (current) use of systemic steroids: Secondary | ICD-10-CM | POA: Insufficient documentation

## 2024-02-24 DIAGNOSIS — Z79620 Long term (current) use of immunosuppressive biologic: Secondary | ICD-10-CM | POA: Insufficient documentation

## 2024-02-24 LAB — CBC WITH DIFFERENTIAL (CANCER CENTER ONLY)
Abs Immature Granulocytes: 0.04 K/uL (ref 0.00–0.07)
Basophils Absolute: 0 K/uL (ref 0.0–0.1)
Basophils Relative: 0 %
Eosinophils Absolute: 0 K/uL (ref 0.0–0.5)
Eosinophils Relative: 0 %
HCT: 27.4 % — ABNORMAL LOW (ref 39.0–52.0)
Hemoglobin: 9 g/dL — ABNORMAL LOW (ref 13.0–17.0)
Immature Granulocytes: 2 %
Lymphocytes Relative: 31 %
Lymphs Abs: 0.8 K/uL (ref 0.7–4.0)
MCH: 31.6 pg (ref 26.0–34.0)
MCHC: 32.8 g/dL (ref 30.0–36.0)
MCV: 96.1 fL (ref 80.0–100.0)
Monocytes Absolute: 0.2 K/uL (ref 0.1–1.0)
Monocytes Relative: 8 %
Neutro Abs: 1.5 K/uL — ABNORMAL LOW (ref 1.7–7.7)
Neutrophils Relative %: 59 %
Platelet Count: 158 K/uL (ref 150–400)
RBC: 2.85 MIL/uL — ABNORMAL LOW (ref 4.22–5.81)
RDW: 16.4 % — ABNORMAL HIGH (ref 11.5–15.5)
WBC Count: 2.6 K/uL — ABNORMAL LOW (ref 4.0–10.5)
nRBC: 0 % (ref 0.0–0.2)

## 2024-02-24 LAB — CMP (CANCER CENTER ONLY)
ALT: 21 U/L (ref 0–44)
AST: 23 U/L (ref 15–41)
Albumin: 3.9 g/dL (ref 3.5–5.0)
Alkaline Phosphatase: 91 U/L (ref 38–126)
Anion gap: 6 (ref 5–15)
BUN: 21 mg/dL (ref 8–23)
CO2: 30 mmol/L (ref 22–32)
Calcium: 9.3 mg/dL (ref 8.9–10.3)
Chloride: 104 mmol/L (ref 98–111)
Creatinine: 1.26 mg/dL — ABNORMAL HIGH (ref 0.61–1.24)
GFR, Estimated: >60 mL/min (ref >60)
Glucose, Bld: 123 mg/dL — ABNORMAL HIGH (ref 70–99)
Potassium: 3.8 mmol/L (ref 3.5–5.1)
Sodium: 140 mmol/L (ref 135–145)
Total Bilirubin: 0.4 mg/dL (ref 0.0–1.2)
Total Protein: 6.9 g/dL (ref 6.5–8.1)

## 2024-02-24 LAB — T4, FREE: Free T4: 0.99 ng/dL (ref 0.61–1.12)

## 2024-02-24 LAB — TSH: TSH: 1.75 u[IU]/mL (ref 0.350–4.500)

## 2024-02-24 MED ORDER — SODIUM CHLORIDE 0.9 % IV SOLN: INTRAVENOUS | Status: DC

## 2024-02-24 MED ORDER — SODIUM CHLORIDE 0.9 % IV SOLN
1.2500 mg/kg | Freq: Once | INTRAVENOUS | Status: AC
  Administered 2024-02-24: 90 mg via INTRAVENOUS
  Filled 2024-02-24: qty 9

## 2024-02-24 MED ORDER — PROCHLORPERAZINE MALEATE 10 MG PO TABS
10.0000 mg | ORAL_TABLET | Freq: Once | ORAL | Status: AC
  Administered 2024-02-24: 10 mg via ORAL
  Filled 2024-02-24: qty 1

## 2024-02-24 MED ORDER — SODIUM CHLORIDE 0.9 % IV SOLN
200.0000 mg | Freq: Once | INTRAVENOUS | Status: AC
  Administered 2024-02-24: 200 mg via INTRAVENOUS
  Filled 2024-02-24: qty 200

## 2024-02-24 MED ORDER — PHENAZOPYRIDINE HCL 100 MG PO TABS: 100.0000 mg | ORAL_TABLET | Freq: Two times a day (BID) | ORAL | 0 refills | Status: DC | PRN

## 2024-02-24 NOTE — Patient Instructions (Signed)
 [facility] - A DEPT OF [facility]  Discharge Instructions: Thank you for choosing [facility] to provide your oncology and hematology care.   If you have a lab appointment with the Cancer Center, please go directly to the Cancer Center and check in at the registration area.   Wear comfortable clothing and clothing appropriate for easy access to any Portacath or PICC line.   We strive to give you quality time with your provider. You may need to reschedule your appointment if you arrive late (15 or more minutes).  Arriving late affects you and other patients whose appointments are after yours.  Also, if you miss three or more appointments without notifying the office, you may be dismissed from the clinic at the provider's discretion.      For prescription refill requests, have your pharmacy contact our office and allow 72 hours for refills to be completed.    Today you received the following chemotherapy and/or immunotherapy agents: Padcev, Keytruda.      To help prevent nausea and vomiting after your treatment, we encourage you to take your nausea medication as directed.  BELOW ARE SYMPTOMS THAT SHOULD BE REPORTED IMMEDIATELY: *FEVER GREATER THAN 100.4 F (38 C) OR HIGHER *CHILLS OR SWEATING *NAUSEA AND VOMITING THAT IS NOT CONTROLLED WITH YOUR NAUSEA MEDICATION *UNUSUAL SHORTNESS OF BREATH *UNUSUAL BRUISING OR BLEEDING *URINARY PROBLEMS (pain or burning when urinating, or frequent urination) *BOWEL PROBLEMS (unusual diarrhea, constipation, pain near the anus) TENDERNESS IN MOUTH AND THROAT WITH OR WITHOUT PRESENCE OF ULCERS (sore throat, sores in mouth, or a toothache) UNUSUAL RASH, SWELLING OR PAIN  UNUSUAL VAGINAL DISCHARGE OR ITCHING   Items with * indicate a potential emergency and should be followed up as soon as possible or go to the Emergency Department if any problems should occur.  Please show the CHEMOTHERAPY ALERT CARD or  IMMUNOTHERAPY ALERT CARD at check-in to the Emergency Department and triage nurse.  Should you have questions after your visit or need to cancel or reschedule your appointment, please contact [facility] - A DEPT OF [facility] [place]  Dept: [phone]  and follow the prompts.  Office hours are 8:00 a.m. to 4:30 p.m. Monday - Friday. Please note that voicemails left after 4:00 p.m. may not be returned until the following business day.  We are closed weekends and major holidays. You have access to a nurse at all times for urgent questions. Please call the main number to the clinic Dept: [phone] and follow the prompts.   For any non-urgent questions, you may also contact your provider using MyChart. We now offer e-Visits for anyone 62 and older to request care online for non-urgent symptoms. For details visit [email or web address].   Also download the MyChart app! Go to the app store, search "MyChart", open the app, select [place], and log in with your MyChart username and password.

## 2024-02-24 NOTE — Assessment & Plan Note (Signed)
Discussed as above.

## 2024-02-27 LAB — MISCELLANEOUS TEST

## 2024-02-28 ENCOUNTER — Other Ambulatory Visit: Payer: Self-pay

## 2024-02-28 ENCOUNTER — Telehealth: Payer: Self-pay

## 2024-02-28 NOTE — Telephone Encounter (Signed)
 Spoke with [facility] they have not sent the scans have not received the order. Order faxed to [facility]. Arland Legions BSN RN

## 2024-03-02 ENCOUNTER — Other Ambulatory Visit

## 2024-03-02 ENCOUNTER — Ambulatory Visit

## 2024-03-09 ENCOUNTER — Other Ambulatory Visit: Payer: Self-pay

## 2024-03-09 ENCOUNTER — Inpatient Hospital Stay (HOSPITAL_BASED_OUTPATIENT_CLINIC_OR_DEPARTMENT_OTHER)

## 2024-03-09 ENCOUNTER — Inpatient Hospital Stay

## 2024-03-09 ENCOUNTER — Inpatient Hospital Stay: Admitting: Dietician

## 2024-03-09 ENCOUNTER — Other Ambulatory Visit (HOSPITAL_COMMUNITY): Payer: Self-pay

## 2024-03-09 VITALS — BP 135/55 | HR 73 | Temp 97.6°F | Wt 159.2 lb

## 2024-03-09 DIAGNOSIS — C678 Malignant neoplasm of overlapping sites of bladder: Secondary | ICD-10-CM

## 2024-03-09 DIAGNOSIS — N3289 Other specified disorders of bladder: Secondary | ICD-10-CM

## 2024-03-09 DIAGNOSIS — Z5112 Encounter for antineoplastic immunotherapy: Secondary | ICD-10-CM | POA: Diagnosis not present

## 2024-03-09 DIAGNOSIS — R300 Dysuria: Secondary | ICD-10-CM

## 2024-03-09 DIAGNOSIS — R1031 Right lower quadrant pain: Secondary | ICD-10-CM | POA: Diagnosis not present

## 2024-03-09 LAB — CMP (CANCER CENTER ONLY)
ALT: 22 U/L (ref 0–44)
AST: 35 U/L (ref 15–41)
Albumin: 4 g/dL (ref 3.5–5.0)
Alkaline Phosphatase: 105 U/L (ref 38–126)
Anion gap: 9 (ref 5–15)
BUN: 17 mg/dL (ref 8–23)
CO2: 28 mmol/L (ref 22–32)
Calcium: 9.4 mg/dL (ref 8.9–10.3)
Chloride: 103 mmol/L (ref 98–111)
Creatinine: 1.24 mg/dL (ref 0.61–1.24)
GFR, Estimated: >60 mL/min (ref >60)
Glucose, Bld: 150 mg/dL — ABNORMAL HIGH (ref 70–99)
Potassium: 3.7 mmol/L (ref 3.5–5.1)
Sodium: 140 mmol/L (ref 135–145)
Total Bilirubin: 0.3 mg/dL (ref 0.0–1.2)
Total Protein: 6.9 g/dL (ref 6.5–8.1)

## 2024-03-09 LAB — CBC WITH DIFFERENTIAL (CANCER CENTER ONLY)
Abs Immature Granulocytes: 0.03 K/uL (ref 0.00–0.07)
Basophils Absolute: 0 K/uL (ref 0.0–0.1)
Basophils Relative: 0 %
Eosinophils Absolute: 0 K/uL (ref 0.0–0.5)
Eosinophils Relative: 0 %
HCT: 27.2 % — ABNORMAL LOW (ref 39.0–52.0)
Hemoglobin: 9.1 g/dL — ABNORMAL LOW (ref 13.0–17.0)
Immature Granulocytes: 1 %
Lymphocytes Relative: 21 %
Lymphs Abs: 0.6 K/uL — ABNORMAL LOW (ref 0.7–4.0)
MCH: 32 pg (ref 26.0–34.0)
MCHC: 33.5 g/dL (ref 30.0–36.0)
MCV: 95.8 fL (ref 80.0–100.0)
Monocytes Absolute: 0.2 K/uL (ref 0.1–1.0)
Monocytes Relative: 8 %
Neutro Abs: 2.1 K/uL (ref 1.7–7.7)
Neutrophils Relative %: 70 %
Platelet Count: 177 K/uL (ref 150–400)
RBC: 2.84 MIL/uL — ABNORMAL LOW (ref 4.22–5.81)
RDW: 16.3 % — ABNORMAL HIGH (ref 11.5–15.5)
WBC Count: 3 K/uL — ABNORMAL LOW (ref 4.0–10.5)
nRBC: 0 % (ref 0.0–0.2)

## 2024-03-09 LAB — URINALYSIS, COMPLETE (UACMP) WITH MICROSCOPIC
Bilirubin Urine: NEGATIVE
Glucose, UA: NEGATIVE mg/dL
Ketones, ur: NEGATIVE mg/dL
Nitrite: NEGATIVE
Protein, ur: NEGATIVE mg/dL
Specific Gravity, Urine: 1.006 (ref 1.005–1.030)
pH: 5 (ref 5.0–8.0)

## 2024-03-09 MED ORDER — SODIUM CHLORIDE 0.9 % IV SOLN
1.2500 mg/kg | Freq: Once | INTRAVENOUS | Status: AC
  Administered 2024-03-09: 90 mg via INTRAVENOUS
  Filled 2024-03-09: qty 9

## 2024-03-09 MED ORDER — PHENAZOPYRIDINE HCL 100 MG PO TABS
100.0000 mg | ORAL_TABLET | Freq: Two times a day (BID) | ORAL | 0 refills | Status: DC | PRN
  Filled 2024-03-09: qty 10, 5d supply, fill #0

## 2024-03-09 MED ORDER — PROCHLORPERAZINE MALEATE 10 MG PO TABS
10.0000 mg | ORAL_TABLET | Freq: Once | ORAL | Status: AC
  Administered 2024-03-09: 10 mg via ORAL
  Filled 2024-03-09: qty 1

## 2024-03-09 MED ORDER — SODIUM CHLORIDE 0.9 % IV SOLN: INTRAVENOUS | Status: DC

## 2024-03-09 NOTE — Progress Notes (Signed)
 [facility] OFFICE PROGRESS NOTE  Patient Care Team: [name], MD as PCP - General (Internal Medicine)  Cameron Wu is a 73 y.o.male with history of NMIBC, Hairy cell leukemia, BPH, IBS being seen at [facility] for [facility] of renal pelvis. Referred by Dr. Donnice Siad.    Pathology reviewed from 6/13. RIGHT RENAL PELVIS MASS, BIOPSY:  Detached small fragment of high grade urothelial carcinoma.   Started EVP and plan for 4 cycles and repeat imaging. Outside report suggests progression.  However was before the end of cycle 3.  Request imaging to be uploaded.  This has not recurred.  Discussed obtain NGS to see if other actionable mutations for treatment.   Clinically, concerning for worsening disease progression.  Report of persistent right lower quadrant pain.  No fever, chills or signs of infection.  Urinary symptoms unchanged.  We will obtain CT for evaluation. Assessment & Plan Malignant neoplasm of overlapping sites of bladder [facility]) C4D15 today.  Recurrent cytopenia cannot tolerate day 1, day 8 schedule. Obtain NGS Return every 2 weeks Palliative care referral for ACP Bladder irritation Pyridium  sent to [facility]. UA today Right lower quadrant pain Obtain CT for evaluation  Orders Placed This Encounter  Procedures   CT CHEST ABDOMEN PELVIS W CONTRAST    Standing Status:   Future    Expected Date:   03/16/2024    Expiration Date:   03/09/2025    If indicated for the ordered procedure, I authorize the administration of contrast media per Radiology protocol:   Yes    Does the patient have a contrast media/X-ray dye allergy?:   Yes    Preferred imaging location?:   [facility]    If indicated for the ordered procedure, I authorize the administration of oral contrast media per Radiology protocol:   Yes     Cameron JAYSON Chihuahua, MD  INTERVAL HISTORY: Patient returns for follow-up.  Reports some right lower quadrant pain he think may be  appendicitis.  No fever, chills.  No change from last treatment.  No lower back pain.  Urinary urgency and pain about the same as been couple months.  Oncology History  Bladder cancer (HCC)  03/26/2021 Initial Diagnosis   Bladder cancer (HCC)   12/02/2023 -  Chemotherapy   Patient is on Treatment Plan : UROTHELIAL ADVANCED, METASTATIC ENFORTUMAB D1, D8 + PEMBROLIZUMAB  (200) D1 Q21D     12/08/2023 Cancer Staging   Staging form: Urinary Bladder, AJCC 8th Edition - Clinical: Stage IIIB (cT3, cN2, cM0) - Signed by Wu Cameron JAYSON, MD on 12/08/2023 Stage prefix: Initial diagnosis WHO/ISUP grade (low/high): High Grade Histologic grading system: 2 grade system      PHYSICAL EXAMINATION: ECOG PERFORMANCE STATUS: 2 VSS temperature 97.6.  Pulse 73 BP 135/55  GENERAL: alert, no distress and comfortable SKIN: skin color normal and no jaundice or rash on exposed skin LUNGS: clear to auscultation and no wheeze or rales with normal breathing effort HEART: regular rate & rhythm  ABDOMEN: abdomen soft, non-tender and nondistended. Musculoskeletal: no edema   Relevant data reviewed during this visit included labs.  New imaging ordered.

## 2024-03-09 NOTE — Patient Instructions (Signed)
 [facility] - A DEPT OF [facility]  Discharge Instructions: Thank you for choosing [facility] to provide your oncology and hematology care.   If you have a lab appointment with the Cancer Center, please go directly to the Cancer Center and check in at the registration area.   Wear comfortable clothing and clothing appropriate for easy access to any Portacath or PICC line.   We strive to give you quality time with your provider. You may need to reschedule your appointment if you arrive late (15 or more minutes).  Arriving late affects you and other patients whose appointments are after yours.  Also, if you miss three or more appointments without notifying the office, you may be dismissed from the clinic at the provider's discretion.      For prescription refill requests, have your pharmacy contact our office and allow 72 hours for refills to be completed.    Today you received the following chemotherapy and/or immunotherapy agents: Enfortumab vedotin -ejfv (Padcev )    To help prevent nausea and vomiting after your treatment, we encourage you to take your nausea medication as directed.  BELOW ARE SYMPTOMS THAT SHOULD BE REPORTED IMMEDIATELY: *FEVER GREATER THAN 100.4 F (38 C) OR HIGHER *CHILLS OR SWEATING *NAUSEA AND VOMITING THAT IS NOT CONTROLLED WITH YOUR NAUSEA MEDICATION *UNUSUAL SHORTNESS OF BREATH *UNUSUAL BRUISING OR BLEEDING *URINARY PROBLEMS (pain or burning when urinating, or frequent urination) *BOWEL PROBLEMS (unusual diarrhea, constipation, pain near the anus) TENDERNESS IN MOUTH AND THROAT WITH OR WITHOUT PRESENCE OF ULCERS (sore throat, sores in mouth, or a toothache) UNUSUAL RASH, SWELLING OR PAIN  UNUSUAL VAGINAL DISCHARGE OR ITCHING   Items with * indicate a potential emergency and should be followed up as soon as possible or go to the Emergency Department if any problems should occur.  Please show the CHEMOTHERAPY ALERT  CARD or IMMUNOTHERAPY ALERT CARD at check-in to the Emergency Department and triage nurse.  Should you have questions after your visit or need to cancel or reschedule your appointment, please contact [facility] - A DEPT OF [facility]  Dept: [phone]  and follow the prompts.  Office hours are 8:00 a.m. to 4:30 p.m. Monday - Friday. Please note that voicemails left after 4:00 p.m. may not be returned until the following business day.  We are closed weekends and major holidays. You have access to a nurse at all times for urgent questions. Please call the main number to the clinic Dept: [phone] and follow the prompts.   For any non-urgent questions, you may also contact your provider using MyChart. We now offer e-Visits for anyone 39 and older to request care online for non-urgent symptoms. For details visit [email or web address].   Also download the MyChart app! Go to the app store, search MyChart, open the app, select [place], and log in with your MyChart username and password.

## 2024-03-09 NOTE — Progress Notes (Signed)
 Nutrition Follow-up:  Pt with urothelial cancer. He is receiving neoadjuvant Padcev  + keytruda . Pt under care of Dr. Tina    Met with patient in infusion. He reports taste of foods is much better. Appetite is good, eating everything that is put in front of him. Patient continues supplementing with CIB. Drinks one with breakfast and lunch. He endorses new onset dysphagia in the last 2-3 weeks. Says foods get stuck and take a while to get down. Sometimes this results in heartburn. Patient spoke with Dr. Tina this morning during infusion about this. Planning to get a scan to assess per pt.    Medications: reviewed   Labs: glucose 150  Anthropometrics: Wt 159 lb 4 oz today   11/7 - 159 lb 1.6 oz  10/10 - 157 lb 12.8 oz    NUTRITION DIAGNOSIS: Inadequate oral intake improved    INTERVENTION:  Educated on soft moist foods for ease of intake, taking small bites and chewing very well before swallow Continue supplementing with CIB - 2/day for wt maintenance  Will monitor for scan results    MONITORING, EVALUATION, GOAL: wt trends, intake   NEXT VISIT: Friday December 19 during infusion

## 2024-03-09 NOTE — Assessment & Plan Note (Addendum)
 [id number] today.  Recurrent cytopenia cannot tolerate day 1, day 8 schedule. Obtain NGS Return every 2 weeks Palliative care referral for ACP

## 2024-03-10 LAB — URINE CULTURE: Culture: NO GROWTH

## 2024-03-22 ENCOUNTER — Inpatient Hospital Stay: Attending: Hematology | Admitting: Nurse Practitioner

## 2024-03-22 ENCOUNTER — Encounter: Payer: Self-pay | Admitting: Nurse Practitioner

## 2024-03-22 VITALS — BP 132/72 | HR 81 | Temp 98.2°F | Resp 17 | Ht 72.0 in | Wt 162.0 lb

## 2024-03-22 DIAGNOSIS — C679 Malignant neoplasm of bladder, unspecified: Secondary | ICD-10-CM

## 2024-03-22 DIAGNOSIS — G893 Neoplasm related pain (acute) (chronic): Secondary | ICD-10-CM

## 2024-03-22 DIAGNOSIS — Z515 Encounter for palliative care: Secondary | ICD-10-CM

## 2024-03-22 DIAGNOSIS — Z7189 Other specified counseling: Secondary | ICD-10-CM

## 2024-03-22 DIAGNOSIS — R530 Neoplastic (malignant) related fatigue: Secondary | ICD-10-CM

## 2024-03-22 NOTE — Progress Notes (Signed)
 Palliative Medicine [facility]  [phone] [phone]   Name: Cameron Wu Date: 03/22/2024 MRN: [id number]  DOB: 1950/07/13  Patient Care Team: [name], MD as PCP - General (Internal Medicine)    REASON FOR CONSULTATION: Cameron Wu is a 74 y.o. male with oncologic medical history including high-grade urothelial carcinoma with right renal pelvic mass (2022) currently undergoing treatment with Enfortumab and pembrolizumab , NMIBC, Hairy cell leukemia. Palliative is seeing patient for symptom management and goals of care.    SOCIAL HISTORY:     reports that he quit smoking about 18 years ago. His smoking use included cigarettes. He has never used smokeless tobacco. He reports current alcohol use of about 4.0 standard drinks of alcohol per week. He reports that he does not use drugs.  ADVANCE DIRECTIVES:  Directives on file.  His wife Amedee Cerrone is his primary medical decision-maker  CODE STATUS: Full code  PAST MEDICAL HISTORY: Past Medical History:  Diagnosis Date   Abnormal liver function tests 05/03/2011   Arthritis    Bladder cancer (HCC) 02/2021   high grade nonmuscle invasive bladder cancer   BPH (benign prostatic hypertrophy) 05/03/2011   Chronic kidney disease    Complication of anesthesia    coughs alot and has a very dry, scratchy throat per patient   Elevated transaminase level    GERD (gastroesophageal reflux disease)    Hairy cell leukemia, in remission (HCC) 1992   remission since 2008   History of kidney stones    Hypertension    IBS (irritable bowel syndrome)    Irritable bowel syndrome    2 yrs ago, states he had inflammation around anus. Biopsy: inclusive   Lumbar foraminal stenosis 12/31/2019   Lumbar radiculopathy 11/15/2019   Sleep apnea     PAST SURGICAL HISTORY:  Past Surgical History:  Procedure Laterality Date   BACK SURGERY     BONE MARROW BIOPSY     2007 and 1992   CATARACT  EXTRACTION W/PHACO Right 08/15/2020   Procedure: CATARACT EXTRACTION PHACO AND INTRAOCULAR LENS PLACEMENT RIGHT EYE;  Surgeon: Harrie Agent, MD;  Location: [facility];  Service: Ophthalmology;  Laterality: Right;  right CDE=7.75   COLONOSCOPY  04/19/2006   with polyps   COLONOSCOPY  09/30/2011   Procedure: COLONOSCOPY;  Surgeon: Claudis RAYMOND Rivet, MD;  Location: [facility];  Service: Endoscopy;  Laterality: N/A;  730   COLONOSCOPY N/A 01/11/2019   Procedure: COLONOSCOPY;  Surgeon: Rivet Claudis RAYMOND, MD;  Location: [facility];  Service: Endoscopy;  Laterality: N/A;  100-office move to 9:30am   CYSTOSCOPY W/ RETROGRADES Bilateral 02/23/2021   Procedure: CYSTOSCOPY WITH RETROGRADE PYELOGRAM;  Surgeon: Matilda Senior, MD;  Location: [facility];  Service: Urology;  Laterality: Bilateral;   CYSTOSCOPY W/ RETROGRADES Bilateral 07/05/2022   Procedure: CYSTOSCOPY WITH RETROGRADE PYELOGRAM;  Surgeon: Matilda Senior, MD;  Location: [facility];  Service: Urology;  Laterality: Bilateral;   CYSTOSCOPY W/ RETROGRADES Right 09/30/2023   Procedure: CYSTOSCOPY, WITH RETROGRADE PYELOGRAM, BIOPSY OF MASS;  Surgeon: Selma Donnice SAUNDERS, MD;  Location: [facility];  Service: Urology;  Laterality: Right;   CYSTOSCOPY WITH BIOPSY N/A 07/05/2022   Procedure: CYSTOSCOPY WITH BIOPSY;  Surgeon: Matilda Senior, MD;  Location: [facility];  Service: Urology;  Laterality: N/A;  30 MINS   CYSTOSCOPY WITH URETEROSCOPY AND STENT PLACEMENT Right 09/30/2023   Procedure: ERNESTA, WITH STENT EXCHANGE;  Surgeon: Selma Donnice SAUNDERS, MD;  Location: [facility];  Service: Urology;  Laterality: Right;   HX COLON POLYPS     IR IMAGING GUIDED PORT INSERTION  11/24/2023   TONSILLECTOMY     childhood   TOTAL HIP ARTHROPLASTY Right 12/19/2022   Procedure: RIGHT POSTERIOR TOTAL HIP ARTHROPLASTY;  Surgeon: Edna Toribio LABOR, MD;  Location: [facility];  Service: Orthopedics;  Laterality: Right;    TRANSFORAMINAL LUMBAR INTERBODY FUSION W/ MIS 1 LEVEL Right 11/18/2020   Procedure: Right Lumbar Five-Sacral One Minimally invasive transforaminal lumbar interbody fusion;  Surgeon: Cheryle Debby LABOR, MD;  Location: [facility];  Service: Neurosurgery;  Laterality: Right;   TRANSURETHRAL RESECTION OF BLADDER TUMOR N/A 03/26/2021   Procedure: REPEAT TRANSURETHRAL RESECTION OF BLADDER TUMOR (TURBT)/ POST OPERATIVE INSTILLATION OF GEMCITABINE ;  Surgeon: Matilda Senior, MD;  Location: [facility];  Service: Urology;  Laterality: N/A;   TRANSURETHRAL RESECTION OF BLADDER TUMOR WITH MITOMYCIN -C N/A 02/23/2021   Procedure: TRANSURETHRAL RESECTION OF BLADDER TUMOR;  Surgeon: Matilda Senior, MD;  Location: [facility];  Service: Urology;  Laterality: N/A;   TRANSURETHRAL RESECTION OF BLADDER TUMOR WITH MITOMYCIN -C N/A 10/29/2021   Procedure: TRANSURETHRAL RESECTION OF BLADDER TUMOR WITH GEMCITABINE ;  Surgeon: Matilda Senior, MD;  Location: [facility];  Service: Urology;  Laterality: N/A;    HEMATOLOGY/ONCOLOGY HISTORY:  Oncology History  Bladder cancer (HCC)  03/26/2021 Initial Diagnosis   Bladder cancer (HCC)   12/02/2023 -  Chemotherapy   Patient is on Treatment Plan : UROTHELIAL ADVANCED, METASTATIC ENFORTUMAB D1, D8 + PEMBROLIZUMAB  (200) D1 Q21D     12/08/2023 Cancer Staging   Staging form: Urinary Bladder, AJCC 8th Edition - Clinical: Stage IIIB (cT3, cN2, cM0) - Signed by Tina Pauletta BROCKS, MD on 12/08/2023 Stage prefix: Initial diagnosis WHO/ISUP grade (low/high): High Grade Histologic grading system: 2 grade system     ALLERGIES:  is allergic to penicillins and pollen extract.  MEDICATIONS:  Current Outpatient Medications  Medication Sig Dispense Refill   acetaminophen  (TYLENOL ) 500 MG tablet Take 1,000 mg by mouth every 8 (eight) hours as needed for moderate pain (pain score 4-6).     Calcium  Carb-Cholecalciferol (CALCIUM  + VITAMIN D3  PO) Take 1 tablet by mouth daily with lunch.     Carboxymethylcell-Glycerin  PF 0.5-0.9 % SOLN Place 2 drops into both eyes 4 (four) times daily. 1 each 11   docusate sodium  (COLACE) 100 MG capsule Take 1 capsule (100 mg total) by mouth daily as needed for up to 30 doses. 30 capsule 0   donepezil  (ARICEPT ) 10 MG tablet Take 10 mg by mouth daily.     doxylamine , Sleep, (UNISOM ) 25 MG tablet Take 25 mg by mouth at bedtime as needed for sleep.     dronabinol  (MARINOL ) 2.5 MG capsule Take 1 capsule (2.5 mg total) by mouth 2 (two) times daily before a meal. 60 capsule 0   finasteride  (PROSCAR ) 5 MG tablet Take 1 tablet (5 mg total) by mouth every other day. 90 tablet 3   Ginkgo Biloba (GINKOBA PO) Take 120 mg by mouth daily.     Homeopathic Products (LEG CRAMPS PM SL) Place 2 tablets under the tongue daily as needed (leg cramps).     levocetirizine (XYZAL) 5 MG tablet Take 5 mg by mouth at bedtime as needed for allergies.     lidocaine -prilocaine  (EMLA ) cream Apply to affected area once 30 g 3   loperamide (IMODIUM) 2 MG capsule Take 2 mg by mouth as needed for diarrhea or loose stools.  Menthol , Topical Analgesic, (BIOFREEZE COOL THE PAIN) 4 % GEL Apply 1 Application topically as needed (pain).     Multiple Vitamins-Minerals (ONE-A-DAY MENS 50+) TABS Take 1 tablet by mouth daily.     Naphazoline-Pheniramine (OPCON-A) 0.027-0.315 % SOLN Place 1 drop into both eyes as needed (eye irritation).     ondansetron  (ZOFRAN ) 8 MG tablet Take 1 tablet (8 mg total) by mouth every 8 (eight) hours as needed for nausea or vomiting. 30 tablet 1   pantoprazole (PROTONIX) 40 MG tablet Take 40 mg by mouth every morning.     phenazopyridine  (PYRIDIUM ) 100 MG tablet Take 1 tablet (100 mg total) by mouth 2 (two) times daily as needed for pain. 10 tablet 0   prochlorperazine  (COMPAZINE ) 10 MG tablet Take 1 tablet (10 mg total) by mouth every 6 (six) hours as needed for nausea or vomiting. 30 tablet 1   rosuvastatin   (CRESTOR ) 10 MG tablet Take 10 mg by mouth at bedtime.     Saline (ARY NASAL MIST ALLERGY/SINUS) 2.65 % SOLN Place 1 spray into the nose as needed (congestion).     simethicone  (MYLICON) 125 MG chewable tablet Chew 125 mg by mouth 2 (two) times daily as needed for flatulence.     tamsulosin  (FLOMAX ) 0.4 MG CAPS capsule Take 1 capsule (0.4 mg total) by mouth every other day. 90 capsule 3   triamcinolone  ointment (KENALOG ) 0.5 % Apply 1 Application topically 2 (two) times daily. 30 g 0   Vibegron  75 MG TABS Take 1 tablet (75 mg total) by mouth at bedtime. 30 tablet 1   No current facility-administered medications for this visit.   Facility-Administered Medications Ordered in Other Visits  Medication Dose Route Frequency Provider Last Rate Last Admin   gemcitabine  (GEMZAR ) chemo syringe for bladder instillation 2,000 mg  2,000 mg Bladder Instillation Once Dahlstedt, Stephen, MD        VITAL SIGNS: BP 132/72 (BP Location: Left Arm)   Pulse 81   Temp 98.2 F (36.8 C) (Temporal)   Resp 17   Ht 6' (1.829 m)   Wt 162 lb (73.5 kg)   SpO2 99%   BMI 21.97 kg/m  Filed Weights   03/22/24 1408  Weight: 162 lb (73.5 kg)    Estimated body mass index is 21.97 kg/m as calculated from the following:   Height as of this encounter: 6' (1.829 m).   Weight as of this encounter: 162 lb (73.5 kg).  LABS: CBC:    Component Value Date/Time   WBC 3.0 (L) 03/09/2024 0811   WBC 5.3 09/29/2023 1043   HGB 9.1 (L) 03/09/2024 0811   HGB 13.2 07/27/2022 1008   HGB 14.5 07/11/2013 1028   HCT 27.2 (L) 03/09/2024 0811   HCT 38.6 07/27/2022 1008   HCT 43.0 07/11/2013 1028   PLT 177 03/09/2024 0811   PLT 144 (L) 07/27/2022 1008   MCV 95.8 03/09/2024 0811   MCV 92 07/27/2022 1008   MCV 90.1 07/11/2013 1028   NEUTROABS 2.1 03/09/2024 0811   NEUTROABS 4.2 07/03/2018 1054   NEUTROABS 3.5 07/11/2013 1028   LYMPHSABS 0.6 (L) 03/09/2024 0811   LYMPHSABS 1.5 07/03/2018 1054   LYMPHSABS 1.4 07/11/2013 1028    MONOABS 0.2 03/09/2024 0811   MONOABS 0.3 07/11/2013 1028   EOSABS 0.0 03/09/2024 0811   EOSABS 0.0 07/03/2018 1054   BASOSABS 0.0 03/09/2024 0811   BASOSABS 0.0 07/03/2018 1054   BASOSABS 0.0 07/11/2013 1028   Comprehensive Metabolic Panel:  Component Value Date/Time   NA 140 03/09/2024 0811   NA 144 07/27/2022 1008   NA 143 03/12/2013 1320   K 3.7 03/09/2024 0811   K 4.0 03/12/2013 1320   CL 103 03/09/2024 0811   CL 107 03/06/2012 1439   CO2 28 03/09/2024 0811   CO2 28 03/12/2013 1320   BUN 17 03/09/2024 0811   BUN 12 07/27/2022 1008   BUN 18.2 03/12/2013 1320   CREATININE 1.24 03/09/2024 0811   CREATININE 1.04 11/12/2014 1121   CREATININE 1.0 03/12/2013 1320   GLUCOSE 150 (H) 03/09/2024 0811   GLUCOSE 73 03/12/2013 1320   GLUCOSE 79 03/06/2012 1439   CALCIUM  9.4 03/09/2024 0811   CALCIUM  9.2 03/12/2013 1320   AST 35 03/09/2024 0811   AST 26 03/12/2013 1320   ALT 22 03/09/2024 0811   ALT 46 03/12/2013 1320   ALKPHOS 105 03/09/2024 0811   ALKPHOS 90 03/12/2013 1320   BILITOT 0.3 03/09/2024 0811   BILITOT 0.39 03/12/2013 1320   PROT 6.9 03/09/2024 0811   PROT 6.2 12/05/2017 1150   PROT 6.6 03/12/2013 1320   ALBUMIN 4.0 03/09/2024 0811   ALBUMIN 4.1 12/05/2017 1150   ALBUMIN 3.7 03/12/2013 1320    RADIOGRAPHIC STUDIES: No results found.  PERFORMANCE STATUS (ECOG) : 1 - Symptomatic but completely ambulatory  Review of Systems  Constitutional:  Positive for activity change and fatigue.  Gastrointestinal:  Positive for abdominal pain.  Unless otherwise noted, a complete review of systems is negative.  Physical Exam General: NAD Cardiovascular: regular rate and rhythm Pulmonary: clear ant fields Abdomen: soft, nontender, + bowel sounds Extremities: no edema, no joint deformities Skin: no rashes Neurological: Alert and oriented x3  Discussed the use of AI scribe software for clinical note transcription with the patient, who gave verbal consent to  proceed.  History of Present Illness Cameron Wu is a 73 year old male with bladder cancer who presents to clinic for his initial palliative visit. He is accompanied by his wife, Cheryl. Patient is alert and able to engage appropriately in discussions.   I introduced myself, Maygan RN, and Palliative's role in collaboration with the oncology team. Concept of Palliative Care was introduced as specialized medical care for people and their families living with serious illness.  It focuses on providing relief from the symptoms and stress of a serious illness.  The goal is to improve quality of life for both the patient and the family. Values and goals of care important to patient and family were attempted to be elicited.  Cameron Wu lives in the home with his wife of 47 years.  They have 2 daughters and 1 grandson.  He is a retired armed forces logistics/support/administrative officer.  Enjoys anything that requires him to be outside.  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  He is able to perform most ADLs independently without assistance.  Shares that his focus currently is to maintain normalcy in his life and continue with his normal routines. He has a history of hip replacement surgery and reports feeling unsteady on his feet, particularly in the mornings. He describes a sensation of his hip 'rubbing against something,' which requires him to be cautious to avoid falling.  He has been diagnosed with short-term memory loss, affecting his ability to manage his medications. He expresses concern about the number of medications he has and his uncertainty about which ones to take at times.  Cameron Wu has experienced  a loss of taste for about a month and a half, which he attributes to his chemotherapy treatment. He cannot taste anything at all but has not experienced any nausea or vomiting. He has been using Ensure as a nutritional supplement, consuming one with breakfast each morning. His weight has  increased from 159 pounds on November 21st to 162 pounds currently.  Previously prescribed Marinol  however due to the nationwide backorder and cost of medication this was never started.  He is currently maintaining a sufficient diet as identified by weight gain.  He reports occasional constipation, which he manages with a stool softener. Bowel movements occur twice a day, mainly in the morning.   He experiences difficulty sleeping due to the abdominal pain, which he likens to appendicitis. Additionally, he has a sensation of his throat closing up when eating, which started about a month into chemotherapy. This sensation resolves after eating and drinking. No association with types of food. We will continue to monitor and consider interventions as needed.   Cameron Wu has been experiencing right-sided abdominal pain for approximately two weeks. The pain is described as stabbing and burning, located on the right side. It primarily occurs at night, prompting him to get up and walk around for relief. Movement alleviates the pain, but it often recurs shortly after returning to bed. He has not been taking any pain medication for this issue. He has an upcoming CT Scan on December 9th and would like to allow time for results before considering medication for his pain.   We will continue to closely monitor and support.   Goals of Care We discussed Cameron Wu's current illness and what it means in the larger context of his on-going co-morbidities. Natural disease trajectory and expectations were discussed.  Cameron Wu and his wife are realistic and their understanding of his current cancer diagnosis and plan of care.  He is remaining hopeful and positive that things will continue to stabilize and improve despite understanding of progression and required treatment plan changes.  We discussed importance of taking things one day at a time.   He is clear in his expressed wishes to continue to treat the treatable  allow every opportunity to continue to thrive.  His quality of life is most important.  I empathetically approach discussions regarding goals of care and advance directives.  Patient has a documented advanced directive identifying his wife Theon Sobotka is his primary medical decision-maker and his daughters Almarie and Alto as secondary decision makers in the event he is unable to speak for himself.  Patient has no desire for life-sustaining measures in the event of terminal illness, permanently in a coma, suffering from severe dementia, or in a vegetative state.  I discussed the importance of continued conversation with family and their medical providers regarding overall plan of care and treatment options, ensuring decisions are within the context of the patients values and GOCs. Assessment & Plan Established therapeutic relationship. Education provided on palliative's role in collaboration with their Oncology/Radiation team.  Right-sided bladder cancer with associated right lower quadrant pain Right-sided bladder cancer with associated right lower quadrant pain, described as stabbing and burning, exacerbated by lying down and relieved by movement. Pain is significant at night and occasionally during the day. CT scan scheduled for December 9th to evaluate the cause of pain. No current use of pain medication. Will continue discussions post scan as it relates to any new findings or etiology.  - Await CT scan results scheduled for December 9th. -  Will consider next steps on CT results if pain persists.  Chemotherapy-induced dysgeusia Loss of taste for approximately 1.5 months, coinciding with the start of chemotherapy. No nausea or vomiting reported. Consuming daily Ensure drinks. Weight is up to 162lbs.   Chemotherapy-induced constipation Intermittent constipation managed with stool softeners. Bowel movements occur twice daily, primarily in the morning. Stools described as resembling rabbit  droppings.  Chemotherapy-induced dysphagia Sensation of throat closing during eating and drinking, present since starting chemotherapy. No sores, white patches, or tenderness in the mouth or throat.  Dry rash of arm Dry rash on the arm, not itchy. Triamcinolone  ointment available at home for treatment. - Apply triamcinolone  ointment to the rash as needed.  Gait instability following hip replacement Gait instability and unsteadiness, particularly in the morning, following hip replacement surgery.  Goals of Care Cameron Wu and his wife are realistic and their understanding of his current cancer diagnosis and plan of care.  He is remaining hopeful and positive that things will continue to stabilize and improve despite understanding of progression and required treatment plan changes.   -Advanced Directives in place. His wife is documented primary medical decision maker.  -Clear in expressed wishes to treat the treatable aggressively allowing him every opportunity to thrive.   Follow-Up I will plan to see patient back in 2-3 weeks. Sooner if needed. Patient also scheduled for treatment and follow up tomorrow 12/5. Due to anticipated winter weather he is requesting to cancel for safety in traveling from [place]. Discussed with Dr. Tina will cancel 12/5 appointments and proceed as regularly scheduled for 12/19. Team made aware.   Patient expressed understanding and was in agreement with this plan. He also understands that He can call the clinic at any time with any questions, concerns, or complaints.   Thank you for your referral and allowing Palliative to assist in Mr Detavious Wu [facility] care.   Number and complexity of problems addressed: HIGH - 1 or more chronic illnesses with SEVERE exacerbation, progression, or side effects of treatment - advanced cancer, pain. Any controlled substances utilized were prescribed in the context of palliative care.  Visit consisted of counseling and  education dealing with the complex and emotionally intense issues of symptom management and palliative care in the setting of serious and potentially life-threatening illness.  Signed by: Levon Borer, AGPCNP-BC Palliative Medicine Team/[facility]

## 2024-03-23 ENCOUNTER — Inpatient Hospital Stay: Admitting: Nurse Practitioner

## 2024-03-23 ENCOUNTER — Inpatient Hospital Stay

## 2024-03-26 ENCOUNTER — Other Ambulatory Visit: Payer: Self-pay

## 2024-03-26 DIAGNOSIS — C679 Malignant neoplasm of bladder, unspecified: Secondary | ICD-10-CM

## 2024-03-27 ENCOUNTER — Ambulatory Visit (HOSPITAL_COMMUNITY): Admission: RE | Admit: 2024-03-27 | Discharge: 2024-03-27 | Disposition: A | Source: Ambulatory Visit

## 2024-03-27 DIAGNOSIS — N3289 Other specified disorders of bladder: Secondary | ICD-10-CM

## 2024-03-27 DIAGNOSIS — R1031 Right lower quadrant pain: Secondary | ICD-10-CM

## 2024-03-27 MED ORDER — IOHEXOL 300 MG/ML  SOLN
100.0000 mL | Freq: Once | INTRAMUSCULAR | Status: AC | PRN
  Administered 2024-03-27: 100 mL via INTRAVENOUS

## 2024-04-03 ENCOUNTER — Telehealth: Payer: Self-pay

## 2024-04-03 DIAGNOSIS — C678 Malignant neoplasm of overlapping sites of bladder: Secondary | ICD-10-CM

## 2024-04-03 NOTE — Telephone Encounter (Signed)
 Called and discussed result of PET and progression of disease.  Discussed progression on current treatment. Options are considering cis/gem, likely reduced dose based on his recurrent cytopenia and need for delay treatment. Discussed potential AE with Cameron Wu and Cameron Wu. Discussed Potential multiple side effects with most importantly with him for anemia, low platelets, low white blood cell count, increased risk of infection, sepsis, fatigue, bleeding, hearing loss, neuropathy, renal failure. Discussed palliative nature of treatment and PFS in first line setting of about 6 months and in his case this would be second line. Given his age and above concern, likely will have more side effects, potential shorter survival with complication and persistent cytopenia.   Discussed alternative to cancer treatment will be hospice and focus on comfort and QoL. They will discuss as family today. Hoping to discuss with palliative care as well.   Will follow up after patient's decision. Will communicate with our palliative care team.

## 2024-04-05 ENCOUNTER — Other Ambulatory Visit: Payer: Self-pay

## 2024-04-05 ENCOUNTER — Inpatient Hospital Stay (HOSPITAL_BASED_OUTPATIENT_CLINIC_OR_DEPARTMENT_OTHER): Admitting: Nurse Practitioner

## 2024-04-05 DIAGNOSIS — G893 Neoplasm related pain (acute) (chronic): Secondary | ICD-10-CM | POA: Diagnosis not present

## 2024-04-05 DIAGNOSIS — Z87891 Personal history of nicotine dependence: Secondary | ICD-10-CM

## 2024-04-05 DIAGNOSIS — Z515 Encounter for palliative care: Secondary | ICD-10-CM | POA: Diagnosis not present

## 2024-04-05 DIAGNOSIS — Z7189 Other specified counseling: Secondary | ICD-10-CM

## 2024-04-05 DIAGNOSIS — R530 Neoplastic (malignant) related fatigue: Secondary | ICD-10-CM | POA: Diagnosis not present

## 2024-04-05 DIAGNOSIS — C679 Malignant neoplasm of bladder, unspecified: Secondary | ICD-10-CM | POA: Diagnosis not present

## 2024-04-05 DIAGNOSIS — D649 Anemia, unspecified: Secondary | ICD-10-CM

## 2024-04-05 DIAGNOSIS — E538 Deficiency of other specified B group vitamins: Secondary | ICD-10-CM

## 2024-04-05 NOTE — Progress Notes (Unsigned)
 Palliative Medicine [facility]  Telephone:([phone]) [phone] Fax:([phone]) [phone]   Name: Cameron Wu Date: 04/05/2024 MRN: [id number]  DOB: 1950/04/22  Patient Care Team: Cameron Carwin, MD as PCP - General (Internal Medicine) Cameron Wu, Cameron SAILOR, NP as Nurse Practitioner Cameron Wu and Palliative Medicine)   I connected with Cameron Wu on 04/05/2024 at  1:00 PM EST by telephone and verified that I am speaking with the correct person using two identifiers.   I discussed the limitations, risks, security and privacy concerns of performing an evaluation and management service by telemedicine and the availability of in-person appointments. I also discussed with the patient that there may be a patient responsible charge related to this service. The patient expressed understanding and agreed to proceed.   Other persons participating in the visit and their role in the encounter: N/a   Patients location: Home   Providers location: [facility]   INTERVAL HISTORY: Cameron Wu is a 73 y.o. male with oncologic medical history including high-grade urothelial carcinoma with right renal pelvic mass (2022) currently undergoing treatment with Enfortumab and pembrolizumab , NMIBC, Hairy cell leukemia. Palliative is seeing patient for symptom management and goals of care.   SOCIAL HISTORY:     reports that he quit smoking about 18 years ago. His smoking use included cigarettes. He has never used smokeless tobacco. He reports current alcohol use of about 4.0 standard drinks of alcohol per week. He reports that he does not use drugs.  ADVANCE DIRECTIVES:  Directives on file. His wife Cameron Wu is his primary medical decision-maker   CODE STATUS: Full code  PAST MEDICAL HISTORY: Past Medical History:  Diagnosis Date   Abnormal liver function tests 05/03/2011   Arthritis    Bladder cancer (HCC) 02/2021   high grade nonmuscle invasive bladder cancer   BPH (benign  prostatic hypertrophy) 05/03/2011   Chronic kidney disease    Complication of anesthesia    coughs alot and has a very dry, scratchy throat per patient   Elevated transaminase level    GERD (gastroesophageal reflux disease)    Hairy cell leukemia, in remission (HCC) 1992   remission since 2008   History of kidney stones    Hypertension    IBS (irritable bowel syndrome)    Irritable bowel syndrome    2 yrs ago, states he had inflammation around anus. Biopsy: inclusive   Lumbar foraminal stenosis 12/31/2019   Lumbar radiculopathy 11/15/2019   Sleep apnea     ALLERGIES:  is allergic to penicillins and pollen extract.  MEDICATIONS:  Current Outpatient Medications  Medication Sig Dispense Refill   acetaminophen  (TYLENOL ) 500 MG tablet Take 1,000 mg by mouth every 8 (eight) hours as needed for moderate pain (pain score 4-6).     Calcium  Carb-Cholecalciferol (CALCIUM  + VITAMIN D3 PO) Take 1 tablet by mouth daily with lunch.     docusate sodium  (COLACE) 100 MG capsule Take 1 capsule (100 mg total) by mouth daily as needed for up to 30 doses. 30 capsule 0   donepezil  (ARICEPT ) 10 MG tablet Take 10 mg by mouth daily.     doxylamine , Sleep, (UNISOM ) 25 MG tablet Take 25 mg by mouth at bedtime as needed for sleep.     dronabinol  (MARINOL ) 2.5 MG capsule Take 1 capsule (2.5 mg total) by mouth 2 (two) times daily before a meal. 60 capsule 0   finasteride  (PROSCAR ) 5 MG tablet Take 1 tablet (5 mg total) by mouth every other day.  90 tablet 3   Ginkgo Biloba (GINKOBA PO) Take 120 mg by mouth daily.     Homeopathic Products (LEG CRAMPS PM SL) Place 2 tablets under the tongue daily as needed (leg cramps).     levocetirizine (XYZAL) 5 MG tablet Take 5 mg by mouth at bedtime as needed for allergies.     loperamide (IMODIUM) 2 MG capsule Take 2 mg by mouth as needed for diarrhea or loose stools.     Menthol , Topical Analgesic, (BIOFREEZE COOL THE PAIN) 4 % GEL Apply 1 Application topically as needed  (pain).     Multiple Vitamins-Minerals (ONE-A-DAY MENS 50+) TABS Take 1 tablet by mouth daily.     Naphazoline-Pheniramine (OPCON-A) 0.027-0.315 % SOLN Place 1 drop into both eyes as needed (eye irritation).     pantoprazole (PROTONIX) 40 MG tablet Take 40 mg by mouth every morning.     phenazopyridine  (PYRIDIUM ) 100 MG tablet Take 1 tablet (100 mg total) by mouth 2 (two) times daily as needed for pain. 10 tablet 0   rosuvastatin  (CRESTOR ) 10 MG tablet Take 10 mg by mouth at bedtime.     Saline (ARY NASAL MIST ALLERGY/SINUS) 2.65 % SOLN Place 1 spray into the nose as needed (congestion).     simethicone  (MYLICON) 125 MG chewable tablet Chew 125 mg by mouth 2 (two) times daily as needed for flatulence.     tamsulosin  (FLOMAX ) 0.4 MG CAPS capsule Take 1 capsule (0.4 mg total) by mouth every other day. 90 capsule 3   triamcinolone  ointment (KENALOG ) 0.5 % Apply 1 Application topically 2 (two) times daily. 30 g 0   Vibegron  75 MG TABS Take 1 tablet (75 mg total) by mouth at bedtime. 30 tablet 1   No current facility-administered medications for this visit.   Facility-Administered Medications Ordered in Other Visits  Medication Dose Route Frequency Provider Last Rate Last Admin   gemcitabine  (GEMZAR ) chemo syringe for bladder instillation 2,000 mg  2,000 mg Bladder Instillation Once Dahlstedt, Stephen, MD        VITAL SIGNS: There were no vitals taken for this visit. There were no vitals filed for this visit.  Estimated body mass index is 21.97 kg/m as calculated from the following:   Height as of 03/22/24: 6' (1.829 m).   Weight as of 03/22/24: 162 lb (73.5 kg).   PERFORMANCE STATUS (ECOG) : 1 - Symptomatic but completely ambulatory  IMPRESSION: Discussed the use of AI scribe software for clinical note transcription with the patient, who gave verbal consent to proceed.  History of Present Illness Cameron Wu is a 73 year old male with cancer who presents with concerns about  chemotherapy efficacy and pain management.  He is currently undergoing chemotherapy but feels it is not providing the desired results. He is considering discontinuing chemotherapy due to its lack of efficacy and potential side effects.  He experiences significant pain starting from his right hip and radiating horizontally towards the center of his abdomen. The pain is described as a sharp, nagging sensation that worsens in the late afternoon and persists until about 2 AM, affecting his ability to sleep. He has been taking four regular Tylenol , which initially provided relief but is no longer effective. He previously used oxycodone , prescribed in June 2025, with instructions to take 5 mg every four hours as needed for severe pain. He has nine pills remaining and is considering using them for pain management.  His appetite is poor, and food feels heavy on his stomach,  leading to reduced intake. He is concerned about managing his pain and maintaining his quality of life.  He has a family history of cancer, with both parents having died from breast and lung cancer. He lives with his wife, and his two daughters reside in [place] .  Assessment & Plan Malignant neoplasm under palliative management The cancer is under palliative management with chemotherapy providing no curative benefit. He is considering discontinuing chemotherapy due to lack of efficacy and potential side effects. He is concerned about quality of life and potential side effects of continued treatment, including fatigue, nausea, vomiting, diarrhea, and constipation. He is considering clinical trials but is aware of the risks and uncertainties involved. He is also considering hospice care for quality of life management. He is informed that discontinuing chemotherapy does not mean discontinuing care, and he will continue to be supported by the medical team. - Discuss with wife about discontinuing chemotherapy. - Consider hospice care for  quality of life management. - Maintain contact with medical team for ongoing support.  Neoplasm related pain Experiencing sharp, nagging pain from the right hip to the center of the stomach, worsening in the late afternoon and early morning. Current pain management with Tylenol  is insufficient. Previously prescribed oxycodone  is available and can be used for pain management. He is hesitant to use medications without understanding their purpose but is willing to try oxycodone  for pain relief. - Use oxycodone  5 mg every 4-6 hours as needed for pain. - Monitor pain levels and effectiveness of oxycodone  over the next 24 hours. - Will call back tomorrow to assess pain management and adjust treatment if necessary.  Patient expressed understanding and was in agreement with this plan. He also understands that He can call the clinic at any time with any questions, concerns, or complaints.   Any controlled substances utilized were prescribed in the context of palliative care. PDMP has been reviewed.   Visit consisted of counseling and education dealing with the complex and emotionally intense issues of symptom management and palliative care in the setting of serious and potentially life-threatening illness.  Levon Borer, AGPCNP-BC  Palliative Medicine Team/[facility]

## 2024-04-05 NOTE — Progress Notes (Signed)
 Tentatively adding chemotherapy for Monday pending patient's final decision.

## 2024-04-05 NOTE — Progress Notes (Deleted)
 [facility] OFFICE PROGRESS NOTE  Patient Care Team: [name], MD as PCP - General (Internal Medicine) Pickenpack-Cousar, Fannie SAILOR, NP as Nurse Practitioner [facility] and Palliative Medicine)  Cameron Wu is a 73 y.o.male with history of NMIBC, Hairy cell leukemia, BPH, IBS being seen [facility] of renal pelvis. Referred by Dr. Donnice Siad.    Pathology reviewed from 6/13. RIGHT RENAL PELVIS MASS, BIOPSY:  Detached small fragment of high grade urothelial carcinoma.    Now he has worsening disease progression. Recurrent cytopenia with history of HCL s/p previous chemotherapy. Discussed next line of chemotherapy vs hospice.   Assessment & Plan   No orders of the defined types were placed in this encounter.    [name], MD  INTERVAL HISTORY: Patient returns for follow-up.  Oncology History  Bladder cancer (HCC)  03/26/2021 Initial Diagnosis   Bladder cancer (HCC)   12/02/2023 - 03/09/2024 Chemotherapy   Patient is on Treatment Plan : UROTHELIAL ADVANCED, METASTATIC ENFORTUMAB D1, D8 + PEMBROLIZUMAB  (200) D1 Q21D     12/08/2023 Cancer Staging   Staging form: Urinary Bladder, AJCC 8th Edition - Clinical: Stage IIIB (cT3, cN2, cM0) - Signed by Chihuahua [name], MD on 12/08/2023 Stage prefix: Initial diagnosis WHO/ISUP grade (low/high): High Grade Histologic grading system: 2 grade system   04/09/2024 -  Chemotherapy   Patient is on Treatment Plan : BLADDER Cisplatin D1 + Gemcitabine  D1,8 q21d x 6 Cycles        PHYSICAL EXAMINATION: ECOG PERFORMANCE STATUS: {CHL ONC ECOG PS:[id number]}  There were no vitals filed for this visit. There were no vitals filed for this visit.  Relevant data reviewed during this visit included ***

## 2024-04-06 ENCOUNTER — Inpatient Hospital Stay

## 2024-04-06 ENCOUNTER — Inpatient Hospital Stay: Admitting: Dietician

## 2024-04-06 NOTE — Progress Notes (Signed)
 Pharmacist Chemotherapy Monitoring - Initial Assessment    Anticipated start date: 04/09/2024    The following has been reviewed per standard work regarding the patient's treatment regimen: The patient's diagnosis, treatment plan and drug doses, and organ/hematologic function Lab orders and baseline tests specific to treatment regimen  The treatment plan start date, drug sequencing, and pre-medications Prior authorization status  Patient's documented medication list, including drug-drug interaction screen and prescriptions for anti-emetics and supportive care specific to the treatment regimen The drug concentrations, fluid compatibility, administration routes, and timing of the medications to be used The patient's access for treatment and lifetime cumulative dose history, if applicable  The patient's medication allergies and previous infusion related reactions, if applicable   Changes made to treatment plan:  N/A  Follow up needed:  N/A   Marlee Eleanor Neighbors, RPH, 04/06/2024  10:12 AM

## 2024-04-09 ENCOUNTER — Inpatient Hospital Stay

## 2024-04-09 ENCOUNTER — Encounter: Payer: Self-pay | Admitting: Nurse Practitioner

## 2024-04-09 ENCOUNTER — Inpatient Hospital Stay: Admitting: Nurse Practitioner

## 2024-04-09 DIAGNOSIS — G893 Neoplasm related pain (acute) (chronic): Secondary | ICD-10-CM

## 2024-04-09 DIAGNOSIS — C679 Malignant neoplasm of bladder, unspecified: Secondary | ICD-10-CM

## 2024-04-09 DIAGNOSIS — Z515 Encounter for palliative care: Secondary | ICD-10-CM

## 2024-04-09 DIAGNOSIS — K117 Disturbances of salivary secretion: Secondary | ICD-10-CM | POA: Diagnosis not present

## 2024-04-09 DIAGNOSIS — R530 Neoplastic (malignant) related fatigue: Secondary | ICD-10-CM

## 2024-04-09 MED ORDER — OXYCODONE-ACETAMINOPHEN 5-325 MG PO TABS: 1.0000 | ORAL_TABLET | ORAL | 0 refills | Status: DC | PRN

## 2024-04-09 NOTE — Progress Notes (Signed)
 "    Palliative Medicine [facility]  [phone]) [phone] [phone]   Name: Cameron Wu Date: 04/09/2024 MRN: [id number]  DOB: [birth year]  Patient Care Team: Cameron Carwin, MD as PCP - General (Internal Medicine) Cameron Wu, Cameron SAILOR, NP as Nurse Practitioner [facility] and Palliative Medicine)   I connected with Cameron Wu on 04/09/2024 at 12:00 PM EST by telephone and verified that I am speaking with the correct person using two identifiers.   I discussed the limitations, risks, security and privacy concerns of performing an evaluation and management service by telemedicine and the availability of in-person appointments. I also discussed with the patient that there may be a patient responsible charge related to this service. The patient expressed understanding and agreed to proceed.   Other persons participating in the visit and their role in the encounter:Cameron Wu   Patients location: Home   Providers location: [facility]   INTERVAL HISTORY: Cameron Wu is a 73 y.o. male with oncologic medical history including high-grade urothelial carcinoma with right renal pelvic mass (2022) currently undergoing treatment with Enfortumab and pembrolizumab , NMIBC, Hairy cell leukemia. Palliative is seeing patient for symptom management and goals of care.   SOCIAL HISTORY:     reports that he quit smoking about 18 years ago. His smoking use included cigarettes. He has never used smokeless tobacco. He reports current alcohol use of about 4.0 standard drinks of alcohol per week. He reports that he does not use drugs.  ADVANCE DIRECTIVES:  Directives on file. His Cameron Wu Cameron Wu is his primary medical decision-maker   CODE STATUS: Full code  PAST MEDICAL HISTORY: Past Medical History:  Diagnosis Date   Abnormal liver function tests 05/03/2011   Arthritis    Bladder cancer (HCC) 02/2021   high grade nonmuscle invasive bladder cancer   BPH (benign  prostatic hypertrophy) 05/03/2011   Chronic kidney disease    Complication of anesthesia    coughs alot and has a very dry, scratchy throat per patient   Elevated transaminase level    GERD (gastroesophageal reflux disease)    Hairy cell leukemia, in remission (HCC) 1992   remission since 2008   History of kidney stones    Hypertension    IBS (irritable bowel syndrome)    Irritable bowel syndrome    2 yrs ago, states he had inflammation around anus. Biopsy: inclusive   Lumbar foraminal stenosis 12/31/2019   Lumbar radiculopathy 11/15/2019   Sleep apnea     ALLERGIES:  is allergic to penicillins and pollen extract.  MEDICATIONS:  Current Outpatient Medications  Medication Sig Dispense Refill   oxyCODONE -acetaminophen  (PERCOCET/ROXICET) 5-325 MG tablet Take 1 tablet by mouth every 4 (four) hours as needed for severe pain (pain score 7-10) or moderate pain (pain score 4-6). 45 tablet 0   acetaminophen  (TYLENOL ) 500 MG tablet Take 1,000 mg by mouth every 8 (eight) hours as needed for moderate pain (pain score 4-6).     Calcium  Carb-Cholecalciferol (CALCIUM  + VITAMIN D3 PO) Take 1 tablet by mouth daily with lunch.     docusate sodium  (COLACE) 100 MG capsule Take 1 capsule (100 mg total) by mouth daily as needed for up to 30 doses. 30 capsule 0   donepezil  (ARICEPT ) 10 MG tablet Take 10 mg by mouth daily.     doxylamine , Sleep, (UNISOM ) 25 MG tablet Take 25 mg by mouth at bedtime as needed for sleep.     dronabinol  (MARINOL ) 2.5 MG capsule Take 1 capsule (2.5  mg total) by mouth 2 (two) times daily before a meal. 60 capsule 0   finasteride  (PROSCAR ) 5 MG tablet Take 1 tablet (5 mg total) by mouth every other day. 90 tablet 3   Ginkgo Biloba (GINKOBA PO) Take 120 mg by mouth daily.     Homeopathic Products (LEG CRAMPS PM SL) Place 2 tablets under the tongue daily as needed (leg cramps).     levocetirizine (XYZAL) 5 MG tablet Take 5 mg by mouth at bedtime as needed for allergies.      loperamide (IMODIUM) 2 MG capsule Take 2 mg by mouth as needed for diarrhea or loose stools.     Menthol , Topical Analgesic, (BIOFREEZE COOL THE PAIN) 4 % GEL Apply 1 Application topically as needed (pain).     Multiple Vitamins-Minerals (ONE-A-DAY MENS 50+) TABS Take 1 tablet by mouth daily.     Naphazoline-Pheniramine (OPCON-A) 0.027-0.315 % SOLN Place 1 drop into both eyes as needed (eye irritation).     pantoprazole (PROTONIX) 40 MG tablet Take 40 mg by mouth every morning.     phenazopyridine  (PYRIDIUM ) 100 MG tablet Take 1 tablet (100 mg total) by mouth 2 (two) times daily as needed for pain. 10 tablet 0   rosuvastatin  (CRESTOR ) 10 MG tablet Take 10 mg by mouth at bedtime.     Saline (ARY NASAL MIST ALLERGY/SINUS) 2.65 % SOLN Place 1 spray into the nose as needed (congestion).     simethicone  (MYLICON) 125 MG chewable tablet Chew 125 mg by mouth 2 (two) times daily as needed for flatulence.     tamsulosin  (FLOMAX ) 0.4 MG CAPS capsule Take 1 capsule (0.4 mg total) by mouth every other day. 90 capsule 3   triamcinolone  ointment (KENALOG ) 0.5 % Apply 1 Application topically 2 (two) times daily. 30 g 0   Vibegron  75 MG TABS Take 1 tablet (75 mg total) by mouth at bedtime. 30 tablet 1   No current facility-administered medications for this visit.   Facility-Administered Medications Ordered in Other Visits  Medication Dose Route Frequency Provider Last Rate Last Admin   gemcitabine  (GEMZAR ) chemo syringe for bladder instillation 2,000 mg  2,000 mg Bladder Instillation Once Cameron Wu, Stephen, MD        VITAL SIGNS: There were no vitals taken for this visit. There were no vitals filed for this visit.  Estimated body mass index is 21.97 kg/m as calculated from the following:   Height as of 03/22/24: 6' (1.829 m).   Weight as of 03/22/24: 162 lb (73.5 kg).   PERFORMANCE STATUS (ECOG) : 1 - Symptomatic but completely ambulatory  IMPRESSION: Discussed the use of AI scribe software for  clinical note transcription with the patient, who gave verbal consent to proceed.  History of Present Illness Cameron Wu is a 73 year old male with high grade urothelial cancer who I connected with by phone for ongoing goals of care discussions and complex decision making. His Cameron Wu is present on the call. He denies concerns of uncontrolled nausea, vomiting, constipation, or diarrhea.   His appetite is poor, and food feels heavy on his stomach, leading to reduced intake. Some days are better than others.   He experiences dry mouth, which makes swallowing difficult, especially with dry foods like toast and biscuits. He has  Biotene at home but has not been using it regularly. He also has other remedies like lozenges and ginger candies, which he has not been using consistently. We discussed ways to decrease his dry mouth to  include hard candies such as lemon, ginger, or peppermint and drinking warm/hot lemon and honey teas. He and Cameron Wu verbalized understanding and appreciation.   We discussed Cameron Wu's pain at length. He experiences significant pain starting from his right hip and radiating horizontally towards the center of his abdomen. The pain is described as a sharp, nagging sensation that worsens in the late afternoon. Pain is severe enough to interrupt his quality of sleep at bedtime.   Pain persist despite use of oxycodone . After reviewing medications with patient and Cameron Wu it appears he may have been taking incorrect prescription of expired oxycodone . Cameron Wu will discard expired prescription. We will send in a refill for oxycodone  5/325mg  for use. Patient educated on administration and efficacy. He will take every 4-6 hours as needed for moderate to severe pain.   We will plan close follow-up. All questions answered and support provided.   Goals of Care Cameron Wu and his Cameron Wu have discussed his wishes. At this time they wish to take things one day at a time focusing solely on his  quality of life and aggressively managing symptoms. Cameron Wu expresses desires not to suffer. They are not ready to enroll in hospice however is aware this will be most appropriate in the near future.   04/05/24: Cameron Wu is realistic in his understanding of incurable cancer and prognosis with and without pursuing additional treatment. He is concerned about managing his pain and maintaining his : quality of life as this is most important to him.   He is currently scheduled to undergo chemotherapy but feels it will not provide the desired results and side effects will significantly impact what quality of life he has left. He is considering discontinuing chemotherapy due to its lack of efficacy and potential side effects. We discussed best case and worst case scenario at length. He is appropriately concerned that what time he has left maybe impacted by symptoms related to treatment with understanding it will not change overall prognosis. He wishes to take each day at a time allowing him to continue spending time with his family and making memories feeling the best that he can.   He has a family history of cancer, with both parents having died from breast and lung cancer. He shares their experience where decisions were made to discontinue treatment due to health challenges and quality of life. Emotional support provided.   He knows there is not a right or wrong answer and although it may be difficulty to make decisions and family may not agree the right answer is what he feels is best for him and his quality of life for the time that he has. We do not have a crystal ball to determine exact life expectancy or if symptoms will be minimal. We discussed options of continuing with treatment and discontinuing if he does not feel well versus not.   Mr. Enriquez expresses anxiousness of not having the medical support he may need or if a new medication come to the forefront having ability to pursue. We discussed he  may change his mind at anytime and the medical team will remain in support. We discussed continued telephone visits as needed to manage symptoms.   I empathetically approached discussions and provided education on outpatient hospice support in the home. He verbalized understanding.   All questions answered and support provided. Mr. Assefa would like to discuss further with his Cameron Wu and make decisions that they are both comfortable with.   Assessment & Plan Malignant  neoplasm under palliative management The cancer is under palliative management with chemotherapy providing no curative benefit. He is considering discontinuing chemotherapy due to lack of efficacy and potential side effects. He is concerned about quality of life and potential side effects of continued treatment, including fatigue, nausea, vomiting, diarrhea, and constipation.  He is also considering hospice care for quality of life management. He is informed that discontinuing chemotherapy does not mean discontinuing care, and he will continue to be supported by the medical team. - Discuss with Cameron Wu about complex medical decisions.  -Education provided on hospice's goals and philosophy of care including symptom management support in the home. - Consider hospice care for quality of life management as needed. - Maintain contact with medical team for ongoing support.  Neoplasm related pain Experiencing sharp, nagging pain from the right hip to the center of the stomach, worsening in the late afternoon and early morning. Current pain management with Tylenol  is insufficient.  - Sent refill for oxycodone  5 mg/325 mg Tylenol  to [facility]. - Instructed to take one tablet every 4-6 hours as needed for pain. - Will call on Friday to assess pain management efficacy. - Advised to contact if pain persists or worsens before Friday.  Palliative care management Focus on managing symptoms to improve quality of life. Emphasis on comfort and  functionality while ensuring adequate rest. - Continue current palliative care management strategies. - Monitor for symptom improvement and adjust as necessary.  Xerostomia Difficulty swallowing dry foods. Current management includes biotin and other oral moisturizers, though adherence is inconsistent. - Encouraged use of Biotene and oral moisturizers 15 minutes before meals. - Suggested lemon hard candies, warm teas, and peppermint for saliva stimulation. - Reinforced importance of consistent use of oral moisturizers.  I will plan to contact patient by phone on Friday.   Patient expressed understanding and was in agreement with this plan. He also understands that He can call the clinic at any time with any questions, concerns, or complaints.   Any controlled substances utilized were prescribed in the context of palliative care. PDMP has been reviewed.   I personally spent a total of 45 minutes in the care of the patient today including getting/reviewing separately obtained history, performing a medically appropriate exam/evaluation, counseling and educating, placing orders, referring and communicating with other health care professionals, documenting clinical information in the EHR, communicating results, and coordinating care. Visit consisted of counseling and education dealing with the complex and emotionally intense issues of symptom management and palliative care in the setting of serious and potentially life-threatening illness.  Levon Borer, AGPCNP-BC  Palliative Medicine Team/[facility]    "

## 2024-04-13 ENCOUNTER — Inpatient Hospital Stay: Admitting: Nurse Practitioner

## 2024-04-13 NOTE — Progress Notes (Signed)
 erroneous

## 2024-04-16 ENCOUNTER — Inpatient Hospital Stay: Admitting: Nurse Practitioner

## 2024-04-16 ENCOUNTER — Encounter: Payer: Self-pay | Admitting: Nurse Practitioner

## 2024-04-16 DIAGNOSIS — K117 Disturbances of salivary secretion: Secondary | ICD-10-CM

## 2024-04-16 DIAGNOSIS — R1310 Dysphagia, unspecified: Secondary | ICD-10-CM

## 2024-04-16 DIAGNOSIS — G893 Neoplasm related pain (acute) (chronic): Secondary | ICD-10-CM

## 2024-04-16 DIAGNOSIS — C679 Malignant neoplasm of bladder, unspecified: Secondary | ICD-10-CM

## 2024-04-16 DIAGNOSIS — Z515 Encounter for palliative care: Secondary | ICD-10-CM | POA: Diagnosis not present

## 2024-04-16 DIAGNOSIS — Z7189 Other specified counseling: Secondary | ICD-10-CM

## 2024-04-16 NOTE — Progress Notes (Signed)
 "    Palliative Medicine [facility]  [phone] [phone]   Name: Cameron Wu Date: 04/16/2024 MRN: [id number]  DOB: 01-12-1951  Patient Care Team: [name], MD as PCP - General (Internal Medicine) Pickenpack-Cousar, Fannie SAILOR, NP as Nurse Practitioner [facility] and Palliative Medicine)   I connected with Cameron Wu on 04/16/2024 at 12:30 PM EST by telephone and verified that I am speaking with the correct person using two identifiers.   I discussed the limitations, risks, security and privacy concerns of performing an evaluation and management service by telemedicine and the availability of in-person appointments. I also discussed with the patient that there may be a patient responsible charge related to this service. The patient expressed understanding and agreed to proceed.   Other persons participating in the visit and their role in the encounter:Wife   Patients location: Home   Providers location: [facility]   INTERVAL HISTORY: Cameron Wu is a 73 y.o. male with oncologic medical history including high-grade urothelial carcinoma with right renal pelvic mass (2022) currently undergoing treatment with Enfortumab and pembrolizumab , NMIBC, Hairy cell leukemia. Palliative is seeing patient for symptom management and goals of care.   SOCIAL HISTORY:     reports that he quit smoking about 18 years ago. His smoking use included cigarettes. He has never used smokeless tobacco. He reports current alcohol use of about 4.0 standard drinks of alcohol per week. He reports that he does not use drugs.  ADVANCE DIRECTIVES:  Directives on file. His wife Cameron Wu is his primary medical decision-maker   CODE STATUS: Full code  PAST MEDICAL HISTORY: Past Medical History:  Diagnosis Date   Abnormal liver function tests 05/03/2011   Arthritis    Bladder cancer (HCC) 02/2021   high grade nonmuscle invasive bladder cancer   BPH (benign  prostatic hypertrophy) 05/03/2011   Chronic kidney disease    Complication of anesthesia    coughs alot and has a very dry, scratchy throat per patient   Elevated transaminase level    GERD (gastroesophageal reflux disease)    Hairy cell leukemia, in remission (HCC) 1992   remission since 2008   History of kidney stones    Hypertension    IBS (irritable bowel syndrome)    Irritable bowel syndrome    2 yrs ago, states he had inflammation around anus. Biopsy: inclusive   Lumbar foraminal stenosis 12/31/2019   Lumbar radiculopathy 11/15/2019   Sleep apnea     ALLERGIES:  is allergic to penicillins and pollen extract.  MEDICATIONS:  Current Outpatient Medications  Medication Sig Dispense Refill   acetaminophen  (TYLENOL ) 500 MG tablet Take 1,000 mg by mouth every 8 (eight) hours as needed for moderate pain (pain score 4-6).     Calcium  Carb-Cholecalciferol (CALCIUM  + VITAMIN D3 PO) Take 1 tablet by mouth daily with lunch.     docusate sodium  (COLACE) 100 MG capsule Take 1 capsule (100 mg total) by mouth daily as needed for up to 30 doses. 30 capsule 0   donepezil  (ARICEPT ) 10 MG tablet Take 10 mg by mouth daily.     doxylamine , Sleep, (UNISOM ) 25 MG tablet Take 25 mg by mouth at bedtime as needed for sleep.     dronabinol  (MARINOL ) 2.5 MG capsule Take 1 capsule (2.5 mg total) by mouth 2 (two) times daily before a meal. 60 capsule 0   finasteride  (PROSCAR ) 5 MG tablet Take 1 tablet (5 mg total) by mouth every other day. 90  tablet 3   Ginkgo Biloba (GINKOBA PO) Take 120 mg by mouth daily.     Homeopathic Products (LEG CRAMPS PM SL) Place 2 tablets under the tongue daily as needed (leg cramps).     levocetirizine (XYZAL) 5 MG tablet Take 5 mg by mouth at bedtime as needed for allergies.     loperamide (IMODIUM) 2 MG capsule Take 2 mg by mouth as needed for diarrhea or loose stools.     Menthol , Topical Analgesic, (BIOFREEZE COOL THE PAIN) 4 % GEL Apply 1 Application topically as needed  (pain).     Multiple Vitamins-Minerals (ONE-A-DAY MENS 50+) TABS Take 1 tablet by mouth daily.     Naphazoline-Pheniramine (OPCON-A) 0.027-0.315 % SOLN Place 1 drop into both eyes as needed (eye irritation).     oxyCODONE -acetaminophen  (PERCOCET/ROXICET) 5-325 MG tablet Take 1 tablet by mouth every 4 (four) hours as needed for severe pain (pain score 7-10) or moderate pain (pain score 4-6). 45 tablet 0   pantoprazole (PROTONIX) 40 MG tablet Take 40 mg by mouth every morning.     phenazopyridine  (PYRIDIUM ) 100 MG tablet Take 1 tablet (100 mg total) by mouth 2 (two) times daily as needed for pain. 10 tablet 0   rosuvastatin  (CRESTOR ) 10 MG tablet Take 10 mg by mouth at bedtime.     Saline (ARY NASAL MIST ALLERGY/SINUS) 2.65 % SOLN Place 1 spray into the nose as needed (congestion).     simethicone  (MYLICON) 125 MG chewable tablet Chew 125 mg by mouth 2 (two) times daily as needed for flatulence.     tamsulosin  (FLOMAX ) 0.4 MG CAPS capsule Take 1 capsule (0.4 mg total) by mouth every other day. 90 capsule 3   triamcinolone  ointment (KENALOG ) 0.5 % Apply 1 Application topically 2 (two) times daily. 30 g 0   Vibegron  75 MG TABS Take 1 tablet (75 mg total) by mouth at bedtime. 30 tablet 1   No current facility-administered medications for this visit.   Facility-Administered Medications Ordered in Other Visits  Medication Dose Route Frequency Provider Last Rate Last Admin   gemcitabine  (GEMZAR ) chemo syringe for bladder instillation 2,000 mg  2,000 mg Bladder Instillation Once Dahlstedt, Stephen, MD        VITAL SIGNS: There were no vitals taken for this visit. There were no vitals filed for this visit.  Estimated body mass index is 21.97 kg/m as calculated from the following:   Height as of 03/22/24: 6' (1.829 m).   Weight as of 03/22/24: 162 lb (73.5 kg).   PERFORMANCE STATUS (ECOG) : 1 - Symptomatic but completely ambulatory  IMPRESSION: Discussed the use of AI scribe software for clinical  note transcription with the patient, who gave verbal consent to proceed.  History of Present Illness KACEE Wu is a 73 year old male with high grade urothelial cancer who I connected with by phone for ongoing goals of care discussions and complex decision making. His wife is present on the call. He denies concerns of uncontrolled nausea, vomiting, constipation, or diarrhea.   His appetite fluctuates. Some days are better than others.  He has increasing difficulty swallowing, particularly with dry foods, and describes a sensation of food getting stuck just below the esophagus. He has been using Biotene, lemon and ginger candies to alleviate symptoms, but has lost his taste buds, complicating his ability to assess the effectiveness of these remedies. He plans to try hot tea with honey and lemon to see if it helps. His wife notes that  his swallowing issues resemble her past experiences with esophageal cramping and spasms. No issues with drinking liquids are reported. They are requesting further evaluation to improve what quality of life he has. Education provided on SLP referral for swallowing evaluation. They verbalized understanding and appreciation.   Mr. Pham is experiencing worsening pain that persists once it starts, rather than coming and going. He attributes some of the pain to overexertion. He is currently taking oxycodone  5mg , one tablet every four hours as needed, but finds it insufficient to manage his pain. He is concerned about increasing the dose due to potential side effects like drowsiness or impaired communication however also would like to manage his pain. We discussed plans to trial 2 tablets (10mg ) in the evening hours when he is home and preparing for bed in the event he experiences drowsiness with increase dose. If he tolerates well understands he may proceed with 1-2 tablets as needed every 4 hours for his pain.   He is concerned about his quality of life and the potential  burden on his family. He is contemplating writing a letter to family and friends about his condition and future, but is unsure about the timing and content of such a letter. He is considering assistance from a child psychotherapist to help with this task and recognizing his feelings as he begins to experience disease related symptom burden and prepare for end-of-life at some point. He shares he has close relationships with many different people in his lives and wish to write these letters to share what he is facing and his feelings before he reaches a place in his health journey that doesn't allow him to do so. I shared explaining his cancer state and the decision to focus on comfort while also acknowledging that he is living life one day at a time continuing to make memories while focusing on his quality of life.   We will plan close follow-up. All questions answered and support provided.   Goals of Care He is concerned about his quality of life and the potential burden on his family. He is contemplating writing a letter to family and friends about his condition and future, but is unsure about the timing and content of such a letter. He is considering assistance from a child psychotherapist to help with this task and recognizing his feelings as he begins to experience disease related symptom burden and prepare for end-of-life at some point. He shares he has close relationships with many different people in his lives and wish to write these letters to share what he is facing and his feelings before he reaches a place in his health journey that doesn't allow him to do so. I shared explaining his cancer state and the decision to focus on comfort while also acknowledging that he is living life one day at a time continuing to make memories while focusing on his quality of life.   Mr. Fernandez shares that he has not desire to suffer and questions how does he recognize that life is changing. Education provided on expectations at  end-of-life and symptom onset. I empathetically approached discussions regarding outpatient hospice support that will allow for additional support and resources in the home to navigate what time he has left while offering support to family as well. He verbalized understanding and aware a referral can be placed at anytime by notifying the team.   04/09/24: Mr. Salazar and his wife have discussed his wishes. At this time they wish to take things one  day at a time focusing solely on his quality of life and aggressively managing symptoms. Mr. Lenhoff expresses desires not to suffer. They are not ready to enroll in hospice however is aware this will be most appropriate in the near future.   04/05/24: Mr. Steppe is realistic in his understanding of incurable cancer and prognosis with and without pursuing additional treatment. He is concerned about managing his pain and maintaining his : quality of life as this is most important to him.   He is currently scheduled to undergo chemotherapy but feels it will not provide the desired results and side effects will significantly impact what quality of life he has left. He is considering discontinuing chemotherapy due to its lack of efficacy and potential side effects. We discussed best case and worst case scenario at length. He is appropriately concerned that what time he has left maybe impacted by symptoms related to treatment with understanding it will not change overall prognosis. He wishes to take each day at a time allowing him to continue spending time with his family and making memories feeling the best that he can.   He has a family history of cancer, with both parents having died from breast and lung cancer. He shares their experience where decisions were made to discontinue treatment due to health challenges and quality of life. Emotional support provided.   He knows there is not a right or wrong answer and although it may be difficulty to make decisions and  family may not agree the right answer is what he feels is best for him and his quality of life for the time that he has. We do not have a crystal ball to determine exact life expectancy or if symptoms will be minimal. We discussed options of continuing with treatment and discontinuing if he does not feel well versus not.   Mr. Rafalski expresses anxiousness of not having the medical support he may need or if a new medication come to the forefront having ability to pursue. We discussed he may change his mind at anytime and the medical team will remain in support. We discussed continued telephone visits as needed to manage symptoms.   I empathetically approached discussions and provided education on outpatient hospice support in the home. He verbalized understanding.   All questions answered and support provided. Mr. Bucklew would like to discuss further with his wife and make decisions that they are both comfortable with.   Assessment & Plan Xerostomia Difficulty swallowing dry foods. Current management includes biotin and other oral moisturizers, though adherence is inconsistent. - Encouraged use of Biotene and oral moisturizers 15 minutes before meals. - Suggested lemon hard candies, warm teas, and peppermint for saliva stimulation. - Reinforced importance of consistent use of oral moisturizers.  Dysphagia Difficulty swallowing, especially with dry foods, and sensation of food getting stuck below the esophagus. Possible esophageal narrowing or mechanical issue. No prior ENT evaluation or swallowing assessment. - Referred to speech language pathologist Lupita for swallowing evaluation and modified swallow study. - Advised to avoid using a straw. - Recommended using gravies or sauces with dry foods. - Instructed to drink before and after each bite. - Advised to tuck chin to chest when swallowing to stretch the esophagus.  Cancer related pain management Pain is worsening and persistent, exacerbated  by overexertion at times. Current oxycodone  regimen is insufficient for pain control. - Increased oxycodone  to two tablets in the evening to assess tolerance and effectiveness. - Instructed to monitor for drowsiness or  droginess after evening dose. - Will reassess pain control and functional status after one week.  Advance care planning Discussion about writing a letter to family and friends regarding his illness and future. Concerns about quality of life and not becoming a burden. Discussion about hospice care and its benefits, including 24/7 availability and home visits. - Referred to social worker for assistance with writing a letter to family and friends. - Discussed hospice care options and benefits, including 24/7 availability and home visits. - Plan to touch base later this week to assess progress and address any concerns.  Malignant neoplasm under palliative management The cancer is under palliative management with chemotherapy providing no curative benefit. He has discontinued chemotherapy due to lack of efficacy and potential side effects. He is concerned about quality of life and potential side effects of continued treatment, including fatigue, nausea, vomiting, diarrhea, and constipation.  He is also considering hospice care for quality of life management when time comes.SABRA He is informed that discontinuing chemotherapy does not mean discontinuing care, and he will continue to be supported by the medical team. Focus on managing symptoms to improve quality of life. Emphasis on comfort and functionality while ensuring adequate rest. - Discuss with wife about complex medical decisions.  -Education provided on hospice's goals and philosophy of care including symptom management support in the home. - Consider hospice care for quality of life management as needed. - Maintain contact with medical team for ongoing support Palliative care management - Continue current palliative care management  strategies. - Monitor for symptom improvement and adjust as necessary.  I will plan to contact patient by phone on Friday.    Patient expressed understanding and was in agreement with this plan. He also understands that He can call the clinic at any time with any questions, concerns, or complaints.   Any controlled substances utilized were prescribed in the context of palliative care. PDMP has been reviewed.   I personally spent a total of 55 minutes in the care of the patient today including getting/reviewing separately obtained history, performing a medically appropriate exam/evaluation, counseling and educating, placing orders, referring and communicating with other health care professionals, documenting clinical information in the EHR, communicating results, and coordinating care. Visit consisted of counseling and education dealing with the complex and emotionally intense issues of symptom management and palliative care in the setting of serious and potentially life-threatening illness.  Levon Borer, AGPCNP-BC  Palliative Medicine Team/ Cancer Center    "

## 2024-04-20 ENCOUNTER — Inpatient Hospital Stay

## 2024-04-20 ENCOUNTER — Inpatient Hospital Stay: Attending: Hematology | Admitting: Nurse Practitioner

## 2024-04-20 DIAGNOSIS — R0789 Other chest pain: Secondary | ICD-10-CM

## 2024-04-20 DIAGNOSIS — R530 Neoplastic (malignant) related fatigue: Secondary | ICD-10-CM

## 2024-04-20 DIAGNOSIS — Z87891 Personal history of nicotine dependence: Secondary | ICD-10-CM | POA: Insufficient documentation

## 2024-04-20 DIAGNOSIS — C651 Malignant neoplasm of right renal pelvis: Secondary | ICD-10-CM | POA: Insufficient documentation

## 2024-04-20 DIAGNOSIS — R102 Pelvic and perineal pain unspecified side: Secondary | ICD-10-CM | POA: Diagnosis not present

## 2024-04-20 DIAGNOSIS — R338 Other retention of urine: Secondary | ICD-10-CM | POA: Insufficient documentation

## 2024-04-20 DIAGNOSIS — R319 Hematuria, unspecified: Secondary | ICD-10-CM | POA: Diagnosis not present

## 2024-04-20 DIAGNOSIS — K5903 Drug induced constipation: Secondary | ICD-10-CM

## 2024-04-20 DIAGNOSIS — Z515 Encounter for palliative care: Secondary | ICD-10-CM | POA: Diagnosis not present

## 2024-04-20 DIAGNOSIS — C9141 Hairy cell leukemia, in remission: Secondary | ICD-10-CM

## 2024-04-20 DIAGNOSIS — K5900 Constipation, unspecified: Secondary | ICD-10-CM

## 2024-04-20 DIAGNOSIS — G893 Neoplasm related pain (acute) (chronic): Secondary | ICD-10-CM

## 2024-04-20 MED ORDER — LACTULOSE 20 GM/30ML PO SOLN: 30.0000 mL | Freq: Three times a day (TID) | ORAL | 2 refills | Status: AC | PRN

## 2024-04-20 NOTE — Progress Notes (Signed)
 "    Palliative Medicine [facility]  [phone] [phone]   Name: Cameron Wu Date: 04/20/2024 MRN: [id number]  DOB: August 04, 1950  Patient Care Team: [name], MD as PCP - General (Internal Medicine) Pickenpack-Cousar, Fannie SAILOR, NP as Nurse Practitioner [facility] and Palliative Medicine)   I connected with Cameron Wu on 04/20/2024 at 12:30 PM EST by telephone and verified that I am speaking with the correct person using two identifiers.   I discussed the limitations, risks, security and privacy concerns of performing an evaluation and management service by telemedicine and the availability of in-person appointments. I also discussed with the patient that there may be a patient responsible charge related to this service. The patient expressed understanding and agreed to proceed.   Other persons participating in the visit and their role in the encounter:Wife   Patients location: Home   Providers location: [facility]   INTERVAL HISTORY: Cameron Wu is a 74 y.o. male with oncologic medical history including high-grade urothelial carcinoma with right renal pelvic mass (2022) currently undergoing treatment with Enfortumab and pembrolizumab , NMIBC, Hairy cell leukemia. Palliative is seeing patient for symptom management and goals of care.   SOCIAL HISTORY:     reports that he quit smoking about 18 years ago. His smoking use included cigarettes. He has never used smokeless tobacco. He reports current alcohol use of about 4.0 standard drinks of alcohol per week. He reports that he does not use drugs.  ADVANCE DIRECTIVES:  Directives on file. His wife Cameron Wu is his primary medical decision-maker   CODE STATUS: Full code  PAST MEDICAL HISTORY: Past Medical History:  Diagnosis Date   Abnormal liver function tests 05/03/2011   Arthritis    Bladder cancer (HCC) 02/2021   high grade nonmuscle invasive bladder cancer   BPH (benign  prostatic hypertrophy) 05/03/2011   Chronic kidney disease    Complication of anesthesia    coughs alot and has a very dry, scratchy throat per patient   Elevated transaminase level    GERD (gastroesophageal reflux disease)    Hairy cell leukemia, in remission (HCC) 1992   remission since 2008   History of kidney stones    Hypertension    IBS (irritable bowel syndrome)    Irritable bowel syndrome    2 yrs ago, states he had inflammation around anus. Biopsy: inclusive   Lumbar foraminal stenosis 12/31/2019   Lumbar radiculopathy 11/15/2019   Sleep apnea     ALLERGIES:  is allergic to penicillins and pollen extract.  MEDICATIONS:  Current Outpatient Medications  Medication Sig Dispense Refill   Lactulose  20 GM/30ML SOLN Take 30 mLs (20 g total) by mouth 3 (three) times daily as needed. 237 mL 2   acetaminophen  (TYLENOL ) 500 MG tablet Take 1,000 mg by mouth every 8 (eight) hours as needed for moderate pain (pain score 4-6).     Calcium  Carb-Cholecalciferol (CALCIUM  + VITAMIN D3 PO) Take 1 tablet by mouth daily with lunch.     docusate sodium  (COLACE) 100 MG capsule Take 1 capsule (100 mg total) by mouth daily as needed for up to 30 doses. 30 capsule 0   donepezil  (ARICEPT ) 10 MG tablet Take 10 mg by mouth daily.     doxylamine , Sleep, (UNISOM ) 25 MG tablet Take 25 mg by mouth at bedtime as needed for sleep.     dronabinol  (MARINOL ) 2.5 MG capsule Take 1 capsule (2.5 mg total) by mouth 2 (two) times daily before a  meal. 60 capsule 0   finasteride  (PROSCAR ) 5 MG tablet Take 1 tablet (5 mg total) by mouth every other day. 90 tablet 3   Ginkgo Biloba (GINKOBA PO) Take 120 mg by mouth daily.     Homeopathic Products (LEG CRAMPS PM SL) Place 2 tablets under the tongue daily as needed (leg cramps).     levocetirizine (XYZAL) 5 MG tablet Take 5 mg by mouth at bedtime as needed for allergies.     loperamide (IMODIUM) 2 MG capsule Take 2 mg by mouth as needed for diarrhea or loose stools.      Menthol , Topical Analgesic, (BIOFREEZE COOL THE PAIN) 4 % GEL Apply 1 Application topically as needed (pain).     Multiple Vitamins-Minerals (ONE-A-DAY MENS 50+) TABS Take 1 tablet by mouth daily.     Naphazoline-Pheniramine (OPCON-A) 0.027-0.315 % SOLN Place 1 drop into both eyes as needed (eye irritation).     oxyCODONE -acetaminophen  (PERCOCET/ROXICET) 5-325 MG tablet Take 1 tablet by mouth every 4 (four) hours as needed for severe pain (pain score 7-10) or moderate pain (pain score 4-6). 45 tablet 0   pantoprazole (PROTONIX) 40 MG tablet Take 40 mg by mouth every morning.     phenazopyridine  (PYRIDIUM ) 100 MG tablet Take 1 tablet (100 mg total) by mouth 2 (two) times daily as needed for pain. 10 tablet 0   rosuvastatin  (CRESTOR ) 10 MG tablet Take 10 mg by mouth at bedtime.     Saline (ARY NASAL MIST ALLERGY/SINUS) 2.65 % SOLN Place 1 spray into the nose as needed (congestion).     simethicone  (MYLICON) 125 MG chewable tablet Chew 125 mg by mouth 2 (two) times daily as needed for flatulence.     tamsulosin  (FLOMAX ) 0.4 MG CAPS capsule Take 1 capsule (0.4 mg total) by mouth every other day. 90 capsule 3   triamcinolone  ointment (KENALOG ) 0.5 % Apply 1 Application topically 2 (two) times daily. 30 g 0   Vibegron  75 MG TABS Take 1 tablet (75 mg total) by mouth at bedtime. 30 tablet 1   No current facility-administered medications for this visit.   Facility-Administered Medications Ordered in Other Visits  Medication Dose Route Frequency Provider Last Rate Last Admin   gemcitabine  (GEMZAR ) chemo syringe for bladder instillation 2,000 mg  2,000 mg Bladder Instillation Once Dahlstedt, Stephen, MD        VITAL SIGNS: There were no vitals taken for this visit. There were no vitals filed for this visit.  Estimated body mass index is 21.97 kg/m as calculated from the following:   Height as of 03/22/24: 6' (1.829 m).   Weight as of 03/22/24: 162 lb (73.5 kg).   PERFORMANCE STATUS (ECOG) : 1 -  Symptomatic but completely ambulatory  IMPRESSION: Discussed the use of AI scribe software for clinical note transcription with the patient, who gave verbal consent to proceed.  History of Present Illness Cameron Wu is a 74 year old male with high grade urothelial cancer who I connected with by phone for ongoing goals of care discussions and complex decision making. His wife is present on the call. He denies concerns of uncontrolled nausea, vomiting, constipation, or diarrhea.   Cameron Wu describes pain at the tip of his penis and in the anal area, which worsens during bowel movements. He notes the presence of blood, which he has experienced before but not recently. He also reports urinary urgency, particularly when urinating blood, which is currently occurring. Education provided on possible UTI. He will contact  office next week if interested in coming into the lab at [facility] for urine sample.   He continues to experience pain in the right rib cage, which worsens when pressure is applied to the area between the right hip bone and chest. This pain is described as 'shooting' and requires him to change positions frequently. He is currently taking oxycodone . He is currently taking 5mg /325mg . I have instructed patient to increase to 1-2 tablets every 4 hours as needed for severe pain.   Cameron Wu He reports being constipated and has not had a bowel movement in the last five days, which was painful when it occurred. He is seeking a strong medication to alleviate constipation. Education provided on use of lactulose  for bowel movement. Will send in to his pharmacy.   We will plan close follow-up. All questions answered and support provided.   Goals of Care He is concerned about his quality of life and the potential burden on his family. He is contemplating writing a letter to family and friends about his condition and future, but is unsure about the timing and content of such a letter. He is  considering assistance from a child psychotherapist to help with this task and recognizing his feelings as he begins to experience disease related symptom burden and prepare for end-of-life at some point. He shares he has close relationships with many different people in his lives and wish to write these letters to share what he is facing and his feelings before he reaches a place in his health journey that doesn't allow him to do so. I shared explaining his cancer state and the decision to focus on comfort while also acknowledging that he is living life one day at a time continuing to make memories while focusing on his quality of life.   Cameron Wu shares that he has not desire to suffer and questions how does he recognize that life is changing. Education provided on expectations at end-of-life and symptom onset. I empathetically approached discussions regarding outpatient hospice support that will allow for additional support and resources in the home to navigate what time he has left while offering support to family as well. He verbalized understanding and aware a referral can be placed at anytime by notifying the team.   04/09/24: Cameron Wu and his wife have discussed his wishes. At this time they wish to take things one day at a time focusing solely on his quality of life and aggressively managing symptoms. Cameron Wu expresses desires not to suffer. They are not ready to enroll in hospice however is aware this will be most appropriate in the near future.   04/05/24: Cameron Wu is realistic in his understanding of incurable cancer and prognosis with and without pursuing additional treatment. He is concerned about managing his pain and maintaining his : quality of life as this is most important to him.   He is currently scheduled to undergo chemotherapy but feels it will not provide the desired results and side effects will significantly impact what quality of life he has left. He is considering discontinuing  chemotherapy due to its lack of efficacy and potential side effects. We discussed best case and worst case scenario at length. He is appropriately concerned that what time he has left maybe impacted by symptoms related to treatment with understanding it will not change overall prognosis. He wishes to take each day at a time allowing him to continue spending time with his family and making memories feeling the best  that he can.   He has a family history of cancer, with both parents having died from breast and lung cancer. He shares their experience where decisions were made to discontinue treatment due to health challenges and quality of life. Emotional support provided.   He knows there is not a right or wrong answer and although it may be difficulty to make decisions and family may not agree the right answer is what he feels is best for him and his quality of life for the time that he has. We do not have a crystal ball to determine exact life expectancy or if symptoms will be minimal. We discussed options of continuing with treatment and discontinuing if he does not feel well versus not.   Cameron Wu expresses anxiousness of not having the medical support he may need or if a new medication come to the forefront having ability to pursue. We discussed he may change his mind at anytime and the medical team will remain in support. We discussed continued telephone visits as needed to manage symptoms.   I empathetically approached discussions and provided education on outpatient hospice support in the home. He verbalized understanding.   All questions answered and support provided. Cameron Wu would like to discuss further with his wife and make decisions that they are both comfortable with.   Assessment & Plan  Hematuria with pelvic and perineal pain Hematuria with associated pelvic and perineal pain, including pain at the tip of the penis and anus, and urgency during urination. Blood in urine is present.  Differential includes possible urinary tract infection or other urological issues. - Ordered urine specimen to check for infection or other abnormalities. - Instructed him to call the office one day in advance to schedule urine specimen collection at the lab. - Arranged for urine specimen collection at the [facility] lab if preferred.  Constipation Severe constipation with inability to have a bowel movement for five days, causing significant discomfort and pain during defecation. - Prescribed lactulose , to be taken up to three times a day as needed for constipation. - Sent prescription to the [facility] pharmacy.  Chronic right rib cage pain Chronic pain in the right rib cage, exacerbated by pressure and movement, described as shooting pain. Current pain management with oxycodone  is insufficient. - Increased oxycodone  dosage to two tablets every four hours as needed. - Will plan to touch base on Monday to assess pain management efficacy and adjust dosage if necessary.   Patient expressed understanding and was in agreement with this plan. He also understands that He can call the clinic at any time with any questions, concerns, or complaints.   Any controlled substances utilized were prescribed in the context of palliative care. PDMP has been reviewed.   I personally spent a total of 45 minutes in the care of the patient today including getting/reviewing separately obtained history, performing a medically appropriate exam/evaluation, counseling and educating, placing orders, referring and communicating with other health care professionals, documenting clinical information in the EHR, communicating results, and coordinating care. Visit consisted of counseling and education dealing with the complex and emotionally intense issues of symptom management and palliative care in the setting of serious and potentially life-threatening illness.  Levon Borer, AGPCNP-BC   Palliative Medicine Team/[facility]    "

## 2024-04-23 ENCOUNTER — Other Ambulatory Visit (HOSPITAL_COMMUNITY)
Admission: RE | Admit: 2024-04-23 | Discharge: 2024-04-23 | Disposition: A | Discharge status: Home / Self Care | Source: Ambulatory Visit

## 2024-04-23 ENCOUNTER — Inpatient Hospital Stay

## 2024-04-23 DIAGNOSIS — G893 Neoplasm related pain (acute) (chronic): Secondary | ICD-10-CM

## 2024-04-23 DIAGNOSIS — D649 Anemia, unspecified: Secondary | ICD-10-CM | POA: Diagnosis present

## 2024-04-23 DIAGNOSIS — K117 Disturbances of salivary secretion: Secondary | ICD-10-CM

## 2024-04-23 DIAGNOSIS — Z87891 Personal history of nicotine dependence: Secondary | ICD-10-CM | POA: Diagnosis not present

## 2024-04-23 DIAGNOSIS — F5101 Primary insomnia: Secondary | ICD-10-CM

## 2024-04-23 DIAGNOSIS — C679 Malignant neoplasm of bladder, unspecified: Secondary | ICD-10-CM

## 2024-04-23 DIAGNOSIS — R530 Neoplastic (malignant) related fatigue: Secondary | ICD-10-CM | POA: Diagnosis not present

## 2024-04-23 DIAGNOSIS — E538 Deficiency of other specified B group vitamins: Secondary | ICD-10-CM | POA: Insufficient documentation

## 2024-04-23 DIAGNOSIS — R300 Dysuria: Secondary | ICD-10-CM

## 2024-04-23 DIAGNOSIS — Z515 Encounter for palliative care: Secondary | ICD-10-CM

## 2024-04-23 DIAGNOSIS — R1310 Dysphagia, unspecified: Secondary | ICD-10-CM

## 2024-04-23 LAB — CBC WITH DIFFERENTIAL/PLATELET
Abs Immature Granulocytes: 0.03 K/uL (ref 0.00–0.07)
Basophils Absolute: 0 K/uL (ref 0.0–0.1)
Basophils Relative: 0 %
Eosinophils Absolute: 0 K/uL (ref 0.0–0.5)
Eosinophils Relative: 0 %
HCT: 29.7 % — ABNORMAL LOW (ref 39.0–52.0)
Hemoglobin: 9.5 g/dL — ABNORMAL LOW (ref 13.0–17.0)
Immature Granulocytes: 1 %
Lymphocytes Relative: 15 %
Lymphs Abs: 0.8 K/uL (ref 0.7–4.0)
MCH: 32 pg (ref 26.0–34.0)
MCHC: 32 g/dL (ref 30.0–36.0)
MCV: 100 fL (ref 80.0–100.0)
Monocytes Absolute: 0.5 K/uL (ref 0.1–1.0)
Monocytes Relative: 9 %
Neutro Abs: 3.9 K/uL (ref 1.7–7.7)
Neutrophils Relative %: 75 %
Platelets: 194 K/uL (ref 150–400)
RBC: 2.97 MIL/uL — ABNORMAL LOW (ref 4.22–5.81)
RDW: 14.8 % (ref 11.5–15.5)
WBC: 5.1 K/uL (ref 4.0–10.5)
nRBC: 0 % (ref 0.0–0.2)

## 2024-04-23 LAB — COMPREHENSIVE METABOLIC PANEL WITH GFR
ALT: 8 U/L (ref 0–44)
AST: 46 U/L — ABNORMAL HIGH (ref 15–41)
Albumin: 3.8 g/dL (ref 3.5–5.0)
Alkaline Phosphatase: 90 U/L (ref 38–126)
Anion gap: 8 (ref 5–15)
BUN: 26 mg/dL — ABNORMAL HIGH (ref 8–23)
CO2: 29 mmol/L (ref 22–32)
Calcium: 9.4 mg/dL (ref 8.9–10.3)
Chloride: 96 mmol/L — ABNORMAL LOW (ref 98–111)
Creatinine, Ser: 1.27 mg/dL — ABNORMAL HIGH (ref 0.61–1.24)
GFR, Estimated: 60 mL/min — ABNORMAL LOW
Glucose, Bld: 106 mg/dL — ABNORMAL HIGH (ref 70–99)
Potassium: 4.5 mmol/L (ref 3.5–5.1)
Sodium: 133 mmol/L — ABNORMAL LOW (ref 135–145)
Total Bilirubin: 0.3 mg/dL (ref 0.0–1.2)
Total Protein: 7 g/dL (ref 6.5–8.1)

## 2024-04-23 LAB — VITAMIN B12: Vitamin B-12: 474 pg/mL (ref 180–914)

## 2024-04-23 LAB — FERRITIN: Ferritin: 244 ng/mL (ref 24–336)

## 2024-04-24 ENCOUNTER — Encounter: Payer: Self-pay | Admitting: Nurse Practitioner

## 2024-04-24 MED ORDER — DOXYLAMINE SUCCINATE (SLEEP) 25 MG PO TABS: 25.0000 mg | ORAL_TABLET | Freq: Every evening | ORAL | 3 refills | Status: DC | PRN

## 2024-04-24 MED ORDER — OXYCODONE-ACETAMINOPHEN 10-325 MG PO TABS: 1.0000 | ORAL_TABLET | ORAL | 0 refills | Status: AC | PRN

## 2024-04-24 NOTE — Progress Notes (Signed)
 "    Palliative Medicine [facility]  [phone] [phone]) [phone]   Name: Cameron Wu Date: 04/24/2024 MRN: [id number]  DOB: Jun 07, 1950  Patient Care Team: [name], MD as PCP - General (Internal Medicine) Pickenpack-Cousar, Fannie SAILOR, NP as Nurse Practitioner [facility] and Palliative Medicine)   I connected with Cameron Wu on 04/24/2024 at  2:30 PM EST by telephone and verified that I am speaking with the correct person using two identifiers.   I discussed the limitations, risks, security and privacy concerns of performing an evaluation and management service by telemedicine and the availability of in-person appointments. I also discussed with the patient that there may be a patient responsible charge related to this service. The patient expressed understanding and agreed to proceed.   Other persons participating in the visit and their role in the encounter:Wife   Patients location: Home   Providers location: [facility]   INTERVAL HISTORY: Cameron Wu is a 74 y.o. male with oncologic medical history including high-grade urothelial carcinoma with right renal pelvic mass (2022) currently undergoing treatment with Enfortumab and pembrolizumab , NMIBC, Hairy cell leukemia. Palliative is seeing patient for symptom management and goals of care.   SOCIAL HISTORY:     reports that he quit smoking about 18 years ago. His smoking use included cigarettes. He has never used smokeless tobacco. He reports current alcohol use of about 4.0 standard drinks of alcohol per week. He reports that he does not use drugs.  ADVANCE DIRECTIVES:  Directives on file. His wife Cameron Wu is his primary medical decision-maker   CODE STATUS: Full code  PAST MEDICAL HISTORY: Past Medical History:  Diagnosis Date   Abnormal liver function tests 05/03/2011   Arthritis    Bladder cancer (HCC) 02/2021   high grade nonmuscle invasive bladder cancer   BPH (benign  prostatic hypertrophy) 05/03/2011   Chronic kidney disease    Complication of anesthesia    coughs alot and has a very dry, scratchy throat per patient   Elevated transaminase level    GERD (gastroesophageal reflux disease)    Hairy cell leukemia, in remission (HCC) 1992   remission since 2008   History of kidney stones    Hypertension    IBS (irritable bowel syndrome)    Irritable bowel syndrome    2 yrs ago, states he had inflammation around anus. Biopsy: inclusive   Lumbar foraminal stenosis 12/31/2019   Lumbar radiculopathy 11/15/2019   Sleep apnea     ALLERGIES:  is allergic to penicillins and pollen extract.  MEDICATIONS:  Current Outpatient Medications  Medication Sig Dispense Refill   oxyCODONE -acetaminophen  (PERCOCET) 10-325 MG tablet Take 1 tablet by mouth every 4 (four) hours as needed for pain. 45 tablet 0   Calcium  Carb-Cholecalciferol (CALCIUM  + VITAMIN D3 PO) Take 1 tablet by mouth daily with lunch.     docusate sodium  (COLACE) 100 MG capsule Take 1 capsule (100 mg total) by mouth daily as needed for up to 30 doses. 30 capsule 0   donepezil  (ARICEPT ) 10 MG tablet Take 10 mg by mouth daily.     doxylamine , Sleep, (UNISOM ) 25 MG tablet Take 1 tablet (25 mg total) by mouth at bedtime as needed for sleep. 30 tablet 3   dronabinol  (MARINOL ) 2.5 MG capsule Take 1 capsule (2.5 mg total) by mouth 2 (two) times daily before a meal. 60 capsule 0   finasteride  (PROSCAR ) 5 MG tablet Take 1 tablet (5 mg total) by mouth every other  day. 90 tablet 3   Ginkgo Biloba (GINKOBA PO) Take 120 mg by mouth daily.     Homeopathic Products (LEG CRAMPS PM SL) Place 2 tablets under the tongue daily as needed (leg cramps).     Lactulose  20 GM/30ML SOLN Take 30 mLs (20 g total) by mouth 3 (three) times daily as needed. 237 mL 2   levocetirizine (XYZAL) 5 MG tablet Take 5 mg by mouth at bedtime as needed for allergies.     loperamide (IMODIUM) 2 MG capsule Take 2 mg by mouth as needed for diarrhea  or loose stools.     Menthol , Topical Analgesic, (BIOFREEZE COOL THE PAIN) 4 % GEL Apply 1 Application topically as needed (pain).     Multiple Vitamins-Minerals (ONE-A-DAY MENS 50+) TABS Take 1 tablet by mouth daily.     Naphazoline-Pheniramine (OPCON-A) 0.027-0.315 % SOLN Place 1 drop into both eyes as needed (eye irritation).     pantoprazole (PROTONIX) 40 MG tablet Take 40 mg by mouth every morning.     phenazopyridine  (PYRIDIUM ) 100 MG tablet Take 1 tablet (100 mg total) by mouth 2 (two) times daily as needed for pain. 10 tablet 0   rosuvastatin  (CRESTOR ) 10 MG tablet Take 10 mg by mouth at bedtime.     Saline (ARY NASAL MIST ALLERGY/SINUS) 2.65 % SOLN Place 1 spray into the nose as needed (congestion).     simethicone  (MYLICON) 125 MG chewable tablet Chew 125 mg by mouth 2 (two) times daily as needed for flatulence.     tamsulosin  (FLOMAX ) 0.4 MG CAPS capsule Take 1 capsule (0.4 mg total) by mouth every other day. 90 capsule 3   triamcinolone  ointment (KENALOG ) 0.5 % Apply 1 Application topically 2 (two) times daily. 30 g 0   Vibegron  75 MG TABS Take 1 tablet (75 mg total) by mouth at bedtime. 30 tablet 1   No current facility-administered medications for this visit.   Facility-Administered Medications Ordered in Other Visits  Medication Dose Route Frequency Provider Last Rate Last Admin   gemcitabine  (GEMZAR ) chemo syringe for bladder instillation 2,000 mg  2,000 mg Bladder Instillation Once Dahlstedt, Stephen, MD        VITAL SIGNS: There were no vitals taken for this visit. There were no vitals filed for this visit.  Estimated body mass index is 21.97 kg/m as calculated from the following:   Height as of 03/22/24: 6' (1.829 m).   Weight as of 03/22/24: 162 lb (73.5 kg).   PERFORMANCE STATUS (ECOG) : 1 - Symptomatic but completely ambulatory  IMPRESSION: Discussed the use of AI scribe software for clinical note transcription with the patient, who gave verbal consent to  proceed.  History of Present Illness Cameron Wu is a 74 year old male with high grade urothelial cancer who I connected with by phone for ongoing goals of care discussions and complex decision making. His wife is present on the call. He denies concerns of uncontrolled nausea, vomiting, constipation, or diarrhea.   He experiences cancer related pain managed with hydrocodone . He has been taking two tablets, which alleviates his pain better than one, but he still experiences stabbing pain and discomfort. He often stands in a certain position to alleviate it. He is out of hydrocodone  and requires a new prescription. We will continue with current dosage of oxycodone  10mg  as needed for pain and closely monitor.   He is experiencing sleep disturbances, not sleeping well, and not eating properly. His wife reports that he is not  taking any sleep aids currently, although he has used Unisom  in the past, which was effective. Encouraged patient to restart use of Unisom  for his insomnia. Advised on when to take with consideration of his use of pain medication.   His wife notes that he is getting easily confused, which is a new development. We discussed disease trajectory and symptom burden. At this time they continue to request taking things one day at a time. He was not able to obtain urinalysis yesterday and only receive lab draws when presenting to [facility]. Patient plans to come in tomorrow for UA to determine if he is developing a UTI.   We will plan close follow-up. All questions answered and support provided.   Goals of Care Patient reports he has no desire to proceed with any further treatment. Declines in office visit and follow as offered and discussed with oncology team.    04/16/24: He is concerned about his quality of life and the potential burden on his family. He is contemplating writing a letter to family and friends about his condition and future, but is unsure about the timing and  content of such a letter. He is considering assistance from a child psychotherapist to help with this task and recognizing his feelings as he begins to experience disease related symptom burden and prepare for end-of-life at some point. He shares he has close relationships with many different people in his lives and wish to write these letters to share what he is facing and his feelings before he reaches a place in his health journey that doesn't allow him to do so. I shared explaining his cancer state and the decision to focus on comfort while also acknowledging that he is living life one day at a time continuing to make memories while focusing on his quality of life.   Mr. Mimbs shares that he has not desire to suffer and questions how does he recognize that life is changing. Education provided on expectations at end-of-life and symptom onset. I empathetically approached discussions regarding outpatient hospice support that will allow for additional support and resources in the home to navigate what time he has left while offering support to family as well. He verbalized understanding and aware a referral can be placed at anytime by notifying the team.   04/09/24: Mr. Partch and his wife have discussed his wishes. At this time they wish to take things one day at a time focusing solely on his quality of life and aggressively managing symptoms. Mr. Canada expresses desires not to suffer. They are not ready to enroll in hospice however is aware this will be most appropriate in the near future.   04/05/24: Mr. Wambold is realistic in his understanding of incurable cancer and prognosis with and without pursuing additional treatment. He is concerned about managing his pain and maintaining his : quality of life as this is most important to him.   He is currently scheduled to undergo chemotherapy but feels it will not provide the desired results and side effects will significantly impact what quality of life he has left.  He is considering discontinuing chemotherapy due to its lack of efficacy and potential side effects. We discussed best case and worst case scenario at length. He is appropriately concerned that what time he has left maybe impacted by symptoms related to treatment with understanding it will not change overall prognosis. He wishes to take each day at a time allowing him to continue spending time with his family and  making memories feeling the best that he can.   He has a family history of cancer, with both parents having died from breast and lung cancer. He shares their experience where decisions were made to discontinue treatment due to health challenges and quality of life. Emotional support provided.   He knows there is not a right or wrong answer and although it may be difficulty to make decisions and family may not agree the right answer is what he feels is best for him and his quality of life for the time that he has. We do not have a crystal ball to determine exact life expectancy or if symptoms will be minimal. We discussed options of continuing with treatment and discontinuing if he does not feel well versus not.   Mr. Umbach expresses anxiousness of not having the medical support he may need or if a new medication come to the forefront having ability to pursue. We discussed he may change his mind at anytime and the medical team will remain in support. We discussed continued telephone visits as needed to manage symptoms.   I empathetically approached discussions and provided education on outpatient hospice support in the home. He verbalized understanding.   All questions answered and support provided. Mr. Trickey would like to discuss further with his wife and make decisions that they are both comfortable with.    Assessment & Plan Xerostomia Difficulty swallowing dry foods. Current management includes biotin and other oral moisturizers, though adherence is inconsistent. - Encouraged use of  Biotene and oral moisturizers 15 minutes before meals. - Suggested lemon hard candies, warm teas, and peppermint for saliva stimulation. - Reinforced importance of consistent use of oral moisturizers.  Dysphagia Difficulty swallowing, especially with dry foods, and sensation of food getting stuck below the esophagus. Possible esophageal narrowing or mechanical issue. No prior ENT evaluation or swallowing assessment. - Referred to speech language pathologist Lupita for swallowing evaluation and modified swallow study. - Advised to avoid using a straw. - Recommended using gravies or sauces with dry foods. - Instructed to drink before and after each bite. - Advised to tuck chin to chest when swallowing to stretch the esophagus.  Cancer related pain management Pain is worsening and persistent, exacerbated by overexertion at times. Current oxycodone  regimen is insufficient for pain control. - Increased oxycodone  to two tablets in the evening to assess tolerance and effectiveness. - Continue oxycodone  10mg  every 4 hours as needed.  - Will reassess pain control and functional status after one week.  Advance care planning Discussion about writing a letter to family and friends regarding his illness and future. Concerns about quality of life and not becoming a burden. Discussion about hospice care and its benefits, including 24/7 availability and home visits. - Referred to social worker for assistance with writing a letter to family and friends. - Discussed hospice care options and benefits, including 24/7 availability and home visits. - Plan to touch base later this week to assess progress and address any concerns.  Malignant neoplasm under palliative management The cancer is under palliative management with chemotherapy providing no curative benefit. He has discontinued chemotherapy due to lack of efficacy and potential side effects. He is concerned about quality of life and potential side effects of  continued treatment, including fatigue, nausea, vomiting, diarrhea, and constipation.  He is also considering hospice care for quality of life management when time comes.SABRA He is informed that discontinuing chemotherapy does not mean discontinuing care, and he will continue to be supported  by the medical team. Focus on managing symptoms to improve quality of life. Emphasis on comfort and functionality while ensuring adequate rest. - Discuss with wife about complex medical decisions.  -Education provided on hospice's goals and philosophy of care including symptom management support in the home. - Consider hospice care for quality of life management as needed. - Maintain contact with medical team for ongoing support Palliative care management - Continue current palliative care management strategies. - Monitor for symptom improvement and adjust as necessary.  I will plan to contact patient by phone on Friday.   Palliative care management Focus on symptom management and quality of life. Reports increased confusion, likely part of disease trajectory. - Scheduled lab appointment for urine specimen collection at 12 PM tomorrow. - Plan to touch base in about a week unless changes occur.  Patient expressed understanding and was in agreement with this plan. He also understands that He can call the clinic at any time with any questions, concerns, or complaints.   Any controlled substances utilized were prescribed in the context of palliative care. PDMP has been reviewed.   I personally spent a total of 45 minutes in the care of the patient today including getting/reviewing separately obtained history, performing a medically appropriate exam/evaluation, counseling and educating, placing orders, referring and communicating with other health care professionals, documenting clinical information in the EHR, communicating results, and coordinating care. Visit consisted of counseling and education dealing with the  complex and emotionally intense issues of symptom management and palliative care in the setting of serious and potentially life-threatening illness.  Cameron Wu, AGPCNP-BC  Palliative Medicine Team/[facility]    "

## 2024-04-25 ENCOUNTER — Inpatient Hospital Stay

## 2024-04-25 ENCOUNTER — Telehealth: Payer: Self-pay

## 2024-04-25 ENCOUNTER — Other Ambulatory Visit: Payer: Self-pay | Admitting: Nurse Practitioner

## 2024-04-25 DIAGNOSIS — C679 Malignant neoplasm of bladder, unspecified: Secondary | ICD-10-CM

## 2024-04-25 DIAGNOSIS — Z87891 Personal history of nicotine dependence: Secondary | ICD-10-CM | POA: Diagnosis not present

## 2024-04-25 DIAGNOSIS — C651 Malignant neoplasm of right renal pelvis: Secondary | ICD-10-CM | POA: Diagnosis present

## 2024-04-25 DIAGNOSIS — R300 Dysuria: Secondary | ICD-10-CM

## 2024-04-25 DIAGNOSIS — R338 Other retention of urine: Secondary | ICD-10-CM | POA: Diagnosis not present

## 2024-04-25 LAB — URINALYSIS, COMPLETE (UACMP) WITH MICROSCOPIC
Bacteria, UA: NONE SEEN
Bilirubin Urine: NEGATIVE
Glucose, UA: NEGATIVE mg/dL
Ketones, ur: NEGATIVE mg/dL
Nitrite: NEGATIVE
Protein, ur: 100 mg/dL — AB
RBC / HPF: >50 RBC/hpf (ref 0–5)
Specific Gravity, Urine: 1.016 (ref 1.005–1.030)
pH: 5 (ref 5.0–8.0)

## 2024-04-25 MED ORDER — NITROFURANTOIN MONOHYD MACRO 100 MG PO CAPS: 100.0000 mg | ORAL_CAPSULE | Freq: Two times a day (BID) | ORAL | 0 refills | Status: AC

## 2024-04-25 NOTE — Telephone Encounter (Signed)
 RN called pt to inform of UTI and antibiotics per Levon, NP. Pt verbalized understanding of medication and need to also drink more fluids. Discussed use of liquid IV' (pt has at home) or water  to rehydrate. Pt verbalized understanding as well. No further needs at this time.

## 2024-04-26 ENCOUNTER — Telehealth: Payer: Self-pay

## 2024-04-26 LAB — URINE CULTURE: Culture: NO GROWTH

## 2024-04-26 NOTE — Telephone Encounter (Signed)
 I called and spoke to patient.  Discussed about his updates.  He and his family has been considering palliative care and hospice.  No additional chemotherapy is planned.  He was given antibiotics yesterday for UTI.  He is comfortable transition to hospice when the time comes.  Therefore we will discontinue chemotherapy orders.  I then spoken to his wife.  Report he is feeling weaker, and also having some memory loss.  Discussed chemotherapy is not ideal given this scenario.  It likely will worsen his performance status as well as memory.  Palliative care and hospice is very reasonable.  She asks about when to transition.  I told her that this can be done at anytime.  If not right now, especially when he starts getting weaker, sleeping more, or having more symptoms for example.  I will communicate with palliative care to continue follow-up and transition to hospice as time comes when he is ready.

## 2024-04-26 NOTE — Telephone Encounter (Signed)
 Thank you Dr. Tina. Will do.

## 2024-04-28 ENCOUNTER — Encounter (HOSPITAL_COMMUNITY): Payer: Self-pay

## 2024-04-28 ENCOUNTER — Emergency Department (HOSPITAL_COMMUNITY)
Admission: EM | Admit: 2024-04-28 | Discharge: 2024-04-28 | Disposition: A | Discharge status: Home / Self Care | Attending: Emergency Medicine | Admitting: Emergency Medicine

## 2024-04-28 ENCOUNTER — Emergency Department (HOSPITAL_COMMUNITY)

## 2024-04-28 DIAGNOSIS — R339 Retention of urine, unspecified: Secondary | ICD-10-CM | POA: Diagnosis present

## 2024-04-28 DIAGNOSIS — R1024 Suprapubic pain: Secondary | ICD-10-CM | POA: Diagnosis not present

## 2024-04-28 DIAGNOSIS — Z85520 Personal history of malignant carcinoid tumor of kidney: Secondary | ICD-10-CM | POA: Diagnosis not present

## 2024-04-28 DIAGNOSIS — R42 Dizziness and giddiness: Secondary | ICD-10-CM

## 2024-04-28 DIAGNOSIS — Z8551 Personal history of malignant neoplasm of bladder: Secondary | ICD-10-CM | POA: Diagnosis not present

## 2024-04-28 LAB — COMPREHENSIVE METABOLIC PANEL WITH GFR
ALT: 14 U/L (ref 0–44)
AST: 47 U/L — ABNORMAL HIGH (ref 15–41)
Albumin: 4 g/dL (ref 3.5–5.0)
Alkaline Phosphatase: 92 U/L (ref 38–126)
Anion gap: 16 — ABNORMAL HIGH (ref 5–15)
BUN: 27 mg/dL — ABNORMAL HIGH (ref 8–23)
CO2: 23 mmol/L (ref 22–32)
Calcium: 9.8 mg/dL (ref 8.9–10.3)
Chloride: 95 mmol/L — ABNORMAL LOW (ref 98–111)
Creatinine, Ser: 1.48 mg/dL — ABNORMAL HIGH (ref 0.61–1.24)
GFR, Estimated: 50 mL/min — ABNORMAL LOW
Glucose, Bld: 92 mg/dL (ref 70–99)
Potassium: 4.6 mmol/L (ref 3.5–5.1)
Sodium: 134 mmol/L — ABNORMAL LOW (ref 135–145)
Total Bilirubin: 0.4 mg/dL (ref 0.0–1.2)
Total Protein: 7.3 g/dL (ref 6.5–8.1)

## 2024-04-28 LAB — URINALYSIS, ROUTINE W REFLEX MICROSCOPIC
Bacteria, UA: NONE SEEN
Bilirubin Urine: NEGATIVE
Glucose, UA: NEGATIVE mg/dL
Ketones, ur: 5 mg/dL — AB
Nitrite: NEGATIVE
Protein, ur: 100 mg/dL — AB
RBC / HPF: >50 RBC/hpf (ref 0–5)
Specific Gravity, Urine: 1.021 (ref 1.005–1.030)
pH: 5 (ref 5.0–8.0)

## 2024-04-28 LAB — CBC
HCT: 28.3 % — ABNORMAL LOW (ref 39.0–52.0)
Hemoglobin: 9.1 g/dL — ABNORMAL LOW (ref 13.0–17.0)
MCH: 31.6 pg (ref 26.0–34.0)
MCHC: 32.2 g/dL (ref 30.0–36.0)
MCV: 98.3 fL (ref 80.0–100.0)
Platelets: 190 K/uL (ref 150–400)
RBC: 2.88 MIL/uL — ABNORMAL LOW (ref 4.22–5.81)
RDW: 14.5 % (ref 11.5–15.5)
WBC: 5 K/uL (ref 4.0–10.5)
nRBC: 0 % (ref 0.0–0.2)

## 2024-04-28 LAB — CBG MONITORING, ED: Glucose-Capillary: 89 mg/dL (ref 70–99)

## 2024-04-28 MED ORDER — SODIUM CHLORIDE 0.9 % IV BOLUS
500.0000 mL | Freq: Once | INTRAVENOUS | Status: AC
  Administered 2024-04-28: 500 mL via INTRAVENOUS

## 2024-04-28 MED ORDER — GADOBUTROL 1 MMOL/ML IV SOLN
7.0000 mL | Freq: Once | INTRAVENOUS | Status: AC | PRN
  Administered 2024-04-28: 7 mL via INTRAVENOUS

## 2024-04-28 MED ORDER — IOHEXOL 300 MG/ML  SOLN
100.0000 mL | Freq: Once | INTRAMUSCULAR | Status: AC | PRN
  Administered 2024-04-28: 100 mL via INTRAVENOUS

## 2024-04-28 MED ORDER — OXYCODONE-ACETAMINOPHEN 5-325 MG PO TABS
1.0000 | ORAL_TABLET | Freq: Once | ORAL | Status: AC
  Administered 2024-04-28: 1 via ORAL
  Filled 2024-04-28: qty 1

## 2024-04-28 NOTE — ED Provider Notes (Signed)
 " [place] EMERGENCY DEPARTMENT AT [facility] Provider Note   CSN: [id number] Arrival date & time: 04/28/24  1507     Patient presents with: Urinary Retention and Altered Mental Status   Cameron Wu is a 74 y.o. male.  History of kidney cancer, bladder cancer, hairy cell leukemia.  He is here with his wife today for complaining of some on and off dizziness for the past couple of weeks, has been constant for the past 3 days.  His wife notes that he is slightly more off balance than usual and walking and he states his vision jumps.  No fevers or chills, no headaches, no numbness tingling or weakness.  He also states that this morning he had swelling around his penis, and he was not able to urinate since 9 AM.  He reports some abdominal pain related to this.    Altered Mental Status      Prior to Admission medications  Medication Sig Start Date End Date Taking? Authorizing Provider  Calcium  Carb-Cholecalciferol (CALCIUM  + VITAMIN D3 PO) Take 1 tablet by mouth daily with lunch.    [provider]  docusate sodium  (COLACE) 100 MG capsule Take 1 capsule (100 mg total) by mouth daily as needed for up to 30 doses. 09/30/23   Selma Donnice SAUNDERS, MD  donepezil  (ARICEPT ) 10 MG tablet Take 10 mg by mouth daily.    [provider]  doxylamine , Sleep, (UNISOM ) 25 MG tablet Take 1 tablet (25 mg total) by mouth at bedtime as needed for sleep. 04/24/24   Pickenpack-Cousar, Athena N, NP  dronabinol  (MARINOL ) 2.5 MG capsule Take 1 capsule (2.5 mg total) by mouth 2 (two) times daily before a meal. 01/27/24   Tina Pauletta BROCKS, MD  finasteride  (PROSCAR ) 5 MG tablet Take 1 tablet (5 mg total) by mouth every other day. 02/28/23   Matilda Senior, MD  Ginkgo Biloba (GINKOBA PO) Take 120 mg by mouth daily.    [provider]  Homeopathic Products (LEG CRAMPS PM SL) Place 2 tablets under the tongue daily as needed (leg cramps).    [provider]  Lactulose  20  GM/30ML SOLN Take 30 mLs (20 g total) by mouth 3 (three) times daily as needed. 04/20/24   Pickenpack-Cousar, Athena N, NP  levocetirizine (XYZAL) 5 MG tablet Take 5 mg by mouth at bedtime as needed for allergies.    [provider]  loperamide (IMODIUM) 2 MG capsule Take 2 mg by mouth as needed for diarrhea or loose stools.    [provider]  Menthol , Topical Analgesic, (BIOFREEZE COOL THE PAIN) 4 % GEL Apply 1 Application topically as needed (pain).    [provider]  Multiple Vitamins-Minerals (ONE-A-DAY MENS 50+) TABS Take 1 tablet by mouth daily.    [provider]  Naphazoline-Pheniramine (OPCON-A) 0.027-0.315 % SOLN Place 1 drop into both eyes as needed (eye irritation).    [provider]  nitrofurantoin , macrocrystal-monohydrate, (MACROBID ) 100 MG capsule Take 1 capsule (100 mg total) by mouth 2 (two) times daily. 04/25/24   Pickenpack-Cousar, Athena N, NP  oxyCODONE -acetaminophen  (PERCOCET) 10-325 MG tablet Take 1 tablet by mouth every 4 (four) hours as needed for pain. 04/24/24   Pickenpack-Cousar, Athena N, NP  pantoprazole (PROTONIX) 40 MG tablet Take 40 mg by mouth every morning. 09/19/23   [provider]  phenazopyridine  (PYRIDIUM ) 100 MG tablet Take 1 tablet (100 mg total) by mouth 2 (two) times daily as needed for pain. 03/09/24  Tina Pauletta BROCKS, MD  rosuvastatin  (CRESTOR ) 10 MG tablet Take 10 mg by mouth at bedtime.    [provider]  Saline (ARY NASAL MIST ALLERGY/SINUS) 2.65 % SOLN Place 1 spray into the nose as needed (congestion).    [provider]  simethicone  (MYLICON) 125 MG chewable tablet Chew 125 mg by mouth 2 (two) times daily as needed for flatulence.    [provider]  tamsulosin  (FLOMAX ) 0.4 MG CAPS capsule Take 1 capsule (0.4 mg total) by mouth every other day. 08/10/23   Matilda Senior, MD  triamcinolone  ointment (KENALOG ) 0.5 % Apply 1 Application topically 2 (two) times daily. 01/06/24    Thayil, Irene T, PA-C  Vibegron  75 MG TABS Take 1 tablet (75 mg total) by mouth at bedtime. 12/30/23   Tina Pauletta BROCKS, MD    Allergies: Penicillins and Pollen extract    Review of Systems  Updated Vital Signs BP 119/74 (BP Location: Right Arm)   Pulse 97   Temp 98.2 F (36.8 C) (Oral)   Resp 16   Ht 6' (1.829 m)   Wt 68.5 kg   SpO2 94%   BMI 20.48 kg/m   Physical Exam Vitals and nursing note reviewed.  Constitutional:      General: He is not in acute distress.    Appearance: He is well-developed.  HENT:     Head: Normocephalic and atraumatic.  Eyes:     Conjunctiva/sclera: Conjunctivae normal.  Cardiovascular:     Rate and Rhythm: Normal rate and regular rhythm.     Heart sounds: No murmur heard. Pulmonary:     Effort: Pulmonary effort is normal. No respiratory distress.     Breath sounds: Normal breath sounds.  Abdominal:     Palpations: Abdomen is soft.     Tenderness: There is abdominal tenderness in the suprapubic area.  Genitourinary:    Penis: Normal.      Testes: Normal.     Comments: Chaperoned by Medford Conger, PA-C Musculoskeletal:        General: No swelling.     Cervical back: Neck supple.  Skin:    General: Skin is warm and dry.     Capillary Refill: Capillary refill takes less than 2 seconds.  Neurological:     General: No focal deficit present.     Mental Status: He is alert and oriented to person, place, and time.  Psychiatric:        Mood and Affect: Mood normal.     (all labs ordered are listed, but only abnormal results are displayed) Labs Reviewed  URINALYSIS, ROUTINE W REFLEX MICROSCOPIC - Abnormal; Notable for the following components:      Result Value   Color, Urine AMBER (*)    APPearance CLOUDY (*)    Hgb urine dipstick LARGE (*)    Ketones, ur 5 (*)    Protein, ur 100 (*)    Leukocytes,Ua SMALL (*)    All other components within normal limits  COMPREHENSIVE METABOLIC PANEL WITH GFR  CBC  CBG MONITORING, ED    EKG: EKG  Interpretation Date/Time:  Saturday April 28 2024 16:09:49 EST Ventricular Rate:  101 PR Interval:  184 QRS Duration:  114 QT Interval:  350 QTC Calculation: 453 R Axis:   73  Text Interpretation: Sinus tachycardia with frequent Premature ventricular complexes Right atrial enlargement Incomplete right bundle branch block Minimal voltage criteria for LVH, may be normal variant ( Sokolow-Lyon ) Borderline ECG When compared with ECG of  29-Sep-2023 10:44, Premature ventricular complexes are now Present PR interval has decreased Vent. rate has increased BY  48 BPM QT has lengthened QRS has lengthened, ectopy now present Confirmed by Cleotilde Rogue ([id number]) on 04/28/2024 4:41:26 PM  Radiology: No results found.   Procedures   Medications Ordered in the ED - No data to display                                  Medical Decision Making Differential diagnosis includes but not limited to UTI, prostatitis, urinary retention, CVA, brain metastases, electrolyte disturbance, AKI, other  Course: Patient accompanied by his wife today who helps with history.  He initially primarily came in due to urinary retention, he does have some hematuria but no sign of infection, he was having penile swelling which has not resolved.  He states he has had to have Foley catheters in the past and often times cannot drain his bladder fully.  He is able to urinate a very small amount for urinalysis but still feels like he is having some retained urine so Foley catheter ordered at this time, CBC showed anemia but no leukocytosis stable globin of 9.1 is at his baseline.  CMP is pending. MRI brain ordered due to some intermittent confusion with difficulty with ambulation and blurry vision x 3 days, he has history of kidney cancer, so ordered with without contrast to rule out brain metastasis.  NIH score is 0.  MRI brain showed no mass or abnormal enhancement or no other acute abnormality.  Patient exam is reassuring, awaiting the  remainder of his workup, signed out to Medford Conger, PA-C at this time care.    Amount and/or Complexity of Data Reviewed Labs: ordered. Radiology: ordered.  Risk Prescription drug management.        Final diagnoses:  None    ED Discharge Orders     None          Suellen Sherran DELENA DEVONNA 04/28/24 1926  "

## 2024-04-28 NOTE — ED Provider Notes (Signed)
 Patient was signed out to myself at shift change.  Patient was noted to present to the emergency department secondary to urinary retention as well as some increased confusion and dizziness.  He is pending MRI of the brain and CT scan abdomen and pelvis at this point. Physical Exam  BP (!) 147/89   Pulse 81   Temp 98.2 F (36.8 C) (Oral)   Resp 14   Ht 6' (1.829 m)   Wt 68.5 kg   SpO2 99%   BMI 20.48 kg/m   Physical Exam  Procedures  Procedures  ED Course / MDM    Medical Decision Making Amount and/or Complexity of Data Reviewed Labs: ordered. Radiology: ordered.  Risk Prescription drug management.   Patient is doing well and is stable for discharge home.  Discussed with patient CT scan of the abdomen and pelvis is consistent with worsening of the known masses within his abdomen and pelvis.  He does note that he has been taken off of treatment for his cancer at this point and they are arranging for palliative care.  Foley catheter was placed by nursing staff and patient will follow-up with urology for continued management of the Foley.  He has been followed by Dr. Matilda previously and will call to make an appointment with his office.  Strict turn precautions were discussed for any new or worsening symptoms.  Patient and spouse voiced understanding and had no additional questions.  MRI of the brain was unremarked for any signs of metastatic spread or CVA.       Daralene Lonni BIRCH, PA-C 04/28/24 2231    Cleotilde Rogue, MD 04/29/24 2107

## 2024-04-28 NOTE — Discharge Instructions (Signed)
 Please come follow-up with urology on an outpatient basis.  Please call to make an appointment.  Continue to follow-up with your oncologist as scheduled.  Your CT scan today did demonstrate worsening of your known masses within your abdomen and pelvis.  Return to the emergency department immediately for any new or worsening symptoms.

## 2024-04-28 NOTE — ED Triage Notes (Signed)
 Pt c/o intermittent confusion xa couple weeks, intermittent dizziness x3 days, penile swelling and swelling around base of penis starting today.  Pain score 9/10.  Pt was started on an antibiotic for a UTI last week.  Hx of Kidney CA and last chemo was earlier December.

## 2024-04-28 NOTE — ED Notes (Signed)
 Pt would like his port to be accessed

## 2024-04-30 ENCOUNTER — Encounter: Payer: Self-pay | Admitting: Nurse Practitioner

## 2024-04-30 ENCOUNTER — Telehealth: Payer: Self-pay

## 2024-04-30 ENCOUNTER — Inpatient Hospital Stay (HOSPITAL_BASED_OUTPATIENT_CLINIC_OR_DEPARTMENT_OTHER): Admitting: Nurse Practitioner

## 2024-04-30 DIAGNOSIS — R410 Disorientation, unspecified: Secondary | ICD-10-CM

## 2024-04-30 DIAGNOSIS — E860 Dehydration: Secondary | ICD-10-CM

## 2024-04-30 DIAGNOSIS — Z87891 Personal history of nicotine dependence: Secondary | ICD-10-CM

## 2024-04-30 DIAGNOSIS — G893 Neoplasm related pain (acute) (chronic): Secondary | ICD-10-CM

## 2024-04-30 DIAGNOSIS — C679 Malignant neoplasm of bladder, unspecified: Secondary | ICD-10-CM | POA: Diagnosis not present

## 2024-04-30 DIAGNOSIS — Z7189 Other specified counseling: Secondary | ICD-10-CM

## 2024-04-30 DIAGNOSIS — R338 Other retention of urine: Secondary | ICD-10-CM | POA: Diagnosis not present

## 2024-04-30 DIAGNOSIS — C651 Malignant neoplasm of right renal pelvis: Secondary | ICD-10-CM | POA: Diagnosis not present

## 2024-04-30 DIAGNOSIS — R531 Weakness: Secondary | ICD-10-CM | POA: Diagnosis not present

## 2024-04-30 DIAGNOSIS — Z515 Encounter for palliative care: Secondary | ICD-10-CM

## 2024-04-30 DIAGNOSIS — R630 Anorexia: Secondary | ICD-10-CM

## 2024-04-30 DIAGNOSIS — R530 Neoplastic (malignant) related fatigue: Secondary | ICD-10-CM | POA: Diagnosis not present

## 2024-04-30 LAB — URINE CULTURE: Culture: NO GROWTH

## 2024-04-30 NOTE — Progress Notes (Signed)
 ON PATHWAY REGIMEN - Bladder  No Change  Continue With Treatment as Ordered.  Original Decision Date/Time: 11/18/2023 11:14     A cycle is every 21 days:     Enfortumab vedotin -ejfv      Pembrolizumab    **Always confirm dose/schedule in your pharmacy ordering system**  Patient Characteristics: Advanced/Metastatic Disease, First Line Therapeutic Status: Advanced/Metastatic Disease Line of Therapy: First Line Intent of Therapy: Non-Curative / Palliative Intent, Discussed with Patient

## 2024-04-30 NOTE — Telephone Encounter (Signed)
 Open in error

## 2024-04-30 NOTE — Progress Notes (Signed)
 DISCONTINUE ON PATHWAY REGIMEN - Bladder     A cycle is every 21 days:     Enfortumab vedotin -ejfv      Pembrolizumab    **Always confirm dose/schedule in your pharmacy ordering system**  PRIOR TREATMENT: [id number]: Enfortumab vedotin  1.25 mg/kg D1, 8 + Pembrolizumab  200 mg D1 (up to 35 Cycles) q21 Days Until Progression or Toxicity  START ON PATHWAY REGIMEN - Bladder     A cycle is every 21 days:     Gemcitabine      Cisplatin   **Always confirm dose/schedule in your pharmacy ordering system**  Patient Characteristics: Advanced/Metastatic Disease, Second Line, No Prior Platinum-Based Therapy  and Eligible for a Platinum, Good Renal Function (CrCl ? 60 mL/min) Therapeutic Status: Advanced/Metastatic Disease Line of Therapy: Second Line Renal Function: Good Renal Function (CrCl ? 60 mL/min) Intent of Therapy: Non-Curative / Palliative Intent, Discussed with Patient

## 2024-05-01 ENCOUNTER — Ambulatory Visit: Admitting: Urology

## 2024-05-01 VITALS — BP 129/77 | HR 102

## 2024-05-01 DIAGNOSIS — Z960 Presence of urogenital implants: Secondary | ICD-10-CM | POA: Diagnosis not present

## 2024-05-01 DIAGNOSIS — R339 Retention of urine, unspecified: Secondary | ICD-10-CM

## 2024-05-01 DIAGNOSIS — C678 Malignant neoplasm of overlapping sites of bladder: Secondary | ICD-10-CM

## 2024-05-01 NOTE — Progress Notes (Signed)
 History of Present Illness: Cameron Wu is here for followup of bladder cancer.   11.7.2022: TURBT-  Pathology-high-grade nonmuscle invasive bladder cancer.--6.5 cm posterior wall lesion.  Muscularis present in specimen.  He did not receive gemcitabine .   12.8.2022: Repeat TURBT--8 mm Lt anterior wall papillary lesion, repeat posterior bladder wall lesion resection. Both specimens revealed HG NMIBC. Gemcitabine  administered.   2.8.2023: Induction BCG (6 rxs) completed.   3.21.2023: Here for follow-up with cystoscopy.    5.10.2023: Completed first BCG maintenance   6.27.2023: Here for surveillance cystoscopy.  Significant erythematous area in his trigonal region as well as 2-3 papillary lesions in his bladder dome.   7.13.2023: He underwent biopsy of his trigonal area as well as the papillary lesions. Path--Benign submucosa with chronic inflammation . He has a very thin bladder wall, and gemcitabine  not administered.He did well w/ Bx. Despite negative pathology, he did have papillary recurrences @ dome of bladder.   8.28.2023: Completed 3 maintenance BCG Rxs.   10.10.2023: Cystoscopy was negative.  Cytology inconclusive.   12.19.2023: Completed third round of maintenance BCG   2.13.2024: Cysto negative, cytology + for HG urothelial carcinoma.   3.18.2024: Cysto, bladder biopsies, (B) RGPs. RGPs nml, path--Benign urothelium and submucosa with chronic inflammation and reactive changes    5.9.2024: Completed maintenance BCG  6.18.2024: Cysto negative, cytology atypical/inconclusive.  11.26.2024: completed 5th round of maintenance BCG  1.7.2025: Here for routine check.  He had a hip replacement to refracture while playing pickle ball in the early fall.  He is doing quite well.  No real issues from his BCG.  5.13.2025: Referred to [facility], Dr. Texie.  Underwent cystoscopy, bilateral retrograde pyelograms, bilateral renal washings, biopsy of prostatic urethra, bladder biopsy.   Bluelight cystoscopy was utilized.  There was no obvious fluorescein abnormality in the prostatic urethra or the bladder.  Retrograde study of the left upper tract was normal.  Retrograde study of the right upper tract revealed filling defects in the right renal pelvis.  The cytologies of renal washings were benign bilaterally.  Biopsies of bladder and a prostatic urethra were were benign.  He is not having significant stent pain.  1.13.2026: Here with urinary retention.  He ended up not undergoing right nephro ureterectomy as he had extra renal disease that has progressed despite being on appropriate therapy.  He has decided to pull the plug on any further curative or palliative infusions.  Went to the emergency room 4 days ago with inability to urinate.  Catheter was placed.  He is still on tamsulosin .  Past Medical History:  Diagnosis Date   Abnormal liver function tests 05/03/2011   Arthritis    Bladder cancer (HCC) 02/2021   high grade nonmuscle invasive bladder cancer   BPH (benign prostatic hypertrophy) 05/03/2011   Chronic kidney disease    Complication of anesthesia    coughs alot and has a very dry, scratchy throat per patient   Elevated transaminase level    GERD (gastroesophageal reflux disease)    Hairy cell leukemia, in remission (HCC) 1992   remission since 2008   History of kidney stones    Hypertension    IBS (irritable bowel syndrome)    Irritable bowel syndrome    2 yrs ago, states he had inflammation around anus. Biopsy: inclusive   Lumbar foraminal stenosis 12/31/2019   Lumbar radiculopathy 11/15/2019   Sleep apnea     Past Surgical History:  Procedure Laterality Date   BACK SURGERY     BONE  MARROW BIOPSY     2007 and 1992   CATARACT EXTRACTION W/PHACO Right 08/15/2020   Procedure: CATARACT EXTRACTION PHACO AND INTRAOCULAR LENS PLACEMENT RIGHT EYE;  Surgeon: Harrie Agent, MD;  Location: [facility];  Service: Ophthalmology;  Laterality: Right;  right CDE=7.75    COLONOSCOPY  04/19/2006   with polyps   COLONOSCOPY  09/30/2011   Procedure: COLONOSCOPY;  Surgeon: Claudis RAYMOND Rivet, MD;  Location: [facility];  Service: Endoscopy;  Laterality: N/A;  730   COLONOSCOPY N/A 01/11/2019   Procedure: COLONOSCOPY;  Surgeon: Rivet Claudis RAYMOND, MD;  Location: [facility];  Service: Endoscopy;  Laterality: N/A;  100-office move to 9:30am   CYSTOSCOPY W/ RETROGRADES Bilateral 02/23/2021   Procedure: CYSTOSCOPY WITH RETROGRADE PYELOGRAM;  Surgeon: Matilda Senior, MD;  Location: [facility];  Service: Urology;  Laterality: Bilateral;   CYSTOSCOPY W/ RETROGRADES Bilateral 07/05/2022   Procedure: CYSTOSCOPY WITH RETROGRADE PYELOGRAM;  Surgeon: Matilda Senior, MD;  Location: [facility];  Service: Urology;  Laterality: Bilateral;   CYSTOSCOPY W/ RETROGRADES Right 09/30/2023   Procedure: CYSTOSCOPY, WITH RETROGRADE PYELOGRAM, BIOPSY OF MASS;  Surgeon: Selma Donnice SAUNDERS, MD;  Location: [facility];  Service: Urology;  Laterality: Right;   CYSTOSCOPY WITH BIOPSY N/A 07/05/2022   Procedure: CYSTOSCOPY WITH BIOPSY;  Surgeon: Matilda Senior, MD;  Location: [facility];  Service: Urology;  Laterality: N/A;  30 MINS   CYSTOSCOPY WITH URETEROSCOPY AND STENT PLACEMENT Right 09/30/2023   Procedure: ERNESTA, WITH STENT EXCHANGE;  Surgeon: Selma Donnice SAUNDERS, MD;  Location: [facility];  Service: Urology;  Laterality: Right;   HX COLON POLYPS     IR IMAGING GUIDED PORT INSERTION  11/24/2023   TONSILLECTOMY     childhood   TOTAL HIP ARTHROPLASTY Right 12/19/2022   Procedure: RIGHT POSTERIOR TOTAL HIP ARTHROPLASTY;  Surgeon: Edna Toribio LABOR, MD;  Location: [facility];  Service: Orthopedics;  Laterality: Right;   TRANSFORAMINAL LUMBAR INTERBODY FUSION W/ MIS 1 LEVEL Right 11/18/2020   Procedure: Right Lumbar Five-Sacral One Minimally invasive transforaminal lumbar interbody fusion;  Surgeon: Cheryle Debby LABOR, MD;  Location: [facility];  Service:  Neurosurgery;  Laterality: Right;   TRANSURETHRAL RESECTION OF BLADDER TUMOR N/A 03/26/2021   Procedure: REPEAT TRANSURETHRAL RESECTION OF BLADDER TUMOR (TURBT)/ POST OPERATIVE INSTILLATION OF GEMCITABINE ;  Surgeon: Matilda Senior, MD;  Location: [facility];  Service: Urology;  Laterality: N/A;   TRANSURETHRAL RESECTION OF BLADDER TUMOR WITH MITOMYCIN -C N/A 02/23/2021   Procedure: TRANSURETHRAL RESECTION OF BLADDER TUMOR;  Surgeon: Matilda Senior, MD;  Location: [facility];  Service: Urology;  Laterality: N/A;   TRANSURETHRAL RESECTION OF BLADDER TUMOR WITH MITOMYCIN -C N/A 10/29/2021   Procedure: TRANSURETHRAL RESECTION OF BLADDER TUMOR WITH GEMCITABINE ;  Surgeon: Matilda Senior, MD;  Location: [facility];  Service: Urology;  Laterality: N/A;    Home Medications:  Allergies as of 05/01/2024       Reactions   Penicillins Swelling, Other (See Comments)   Pollen Extract         Medication List        Accurate as of May 01, 2024  3:49 PM. If you have any questions, ask your nurse or doctor.          ARY NASAL MIST ALLERGY/SINUS 2.65 % Soln Generic drug: Saline Place 1 spray into the nose as needed (congestion).   Biofreeze Cool The Pain 4 % Gel Generic drug: Menthol  (Topical Analgesic) Apply 1 Application topically as needed (  pain).   CALCIUM  + VITAMIN D3 PO Take 1 tablet by mouth daily with lunch.   docusate sodium  100 MG capsule Commonly known as: Colace Take 1 capsule (100 mg total) by mouth daily as needed for up to 30 doses.   donepezil  10 MG tablet Commonly known as: ARICEPT  Take 10 mg by mouth daily.   doxylamine  (Sleep) 25 MG tablet Commonly known as: UNISOM  Take 1 tablet (25 mg total) by mouth at bedtime as needed for sleep.   dronabinol  2.5 MG capsule Commonly known as: MARINOL  Take 1 capsule (2.5 mg total) by mouth 2 (two) times daily before a meal.   finasteride  5 MG tablet Commonly known  as: PROSCAR  Take 1 tablet (5 mg total) by mouth every other day.   GINKOBA PO Take 120 mg by mouth daily.   Lactulose  20 GM/30ML Soln Take 30 mLs (20 g total) by mouth 3 (three) times daily as needed.   LEG CRAMPS PM SL Place 2 tablets under the tongue daily as needed (leg cramps).   levocetirizine 5 MG tablet Commonly known as: XYZAL Take 5 mg by mouth at bedtime as needed for allergies.   loperamide 2 MG capsule Commonly known as: IMODIUM Take 2 mg by mouth as needed for diarrhea or loose stools.   nitrofurantoin  (macrocrystal-monohydrate) 100 MG capsule Commonly known as: MACROBID  Take 1 capsule (100 mg total) by mouth 2 (two) times daily.   One-A-Day Mens 50+ Tabs Take 1 tablet by mouth daily.   Opcon-A 0.027-0.315 % Soln Generic drug: Naphazoline-Pheniramine Place 1 drop into both eyes as needed (eye irritation).   oxyCODONE -acetaminophen  10-325 MG tablet Commonly known as: Percocet Take 1 tablet by mouth every 4 (four) hours as needed for pain.   pantoprazole 40 MG tablet Commonly known as: PROTONIX Take 40 mg by mouth every morning.   phenazopyridine  100 MG tablet Commonly known as: Pyridium  Take 1 tablet (100 mg total) by mouth 2 (two) times daily as needed for pain.   rosuvastatin  10 MG tablet Commonly known as: CRESTOR  Take 10 mg by mouth at bedtime.   simethicone  125 MG chewable tablet Commonly known as: MYLICON Chew 125 mg by mouth 2 (two) times daily as needed for flatulence.   tamsulosin  0.4 MG Caps capsule Commonly known as: FLOMAX  Take 1 capsule (0.4 mg total) by mouth every other day.   triamcinolone  ointment 0.5 % Commonly known as: KENALOG  Apply 1 Application topically 2 (two) times daily.   Vibegron  75 MG Tabs Take 1 tablet (75 mg total) by mouth at bedtime.        Allergies:  Allergies  Allergen Reactions   Penicillins Swelling and Other (See Comments)   Pollen Extract     No family history on file.  Social History:   reports that he quit smoking about 18 years ago. His smoking use included cigarettes. He has never used smokeless tobacco. He reports current alcohol use of about 4.0 standard drinks of alcohol per week. He reports that he does not use drugs.  ROS: A complete review of systems was performed.  All systems are negative except for pertinent findings as noted.  Physical Exam:  Vital signs in last 24 hours: There were no vitals taken for this visit. Constitutional:  Alert and oriented, cachectic in appearance with sarcopenia. Cardiovascular: Regular rate  Respiratory: Normal respiratory effort Neurologic: Grossly intact, no focal deficits Psychiatric: Normal mood and affect  I have reviewed prior pt notes--urology and oncology both in [place] and [place]  I have independently reviewed prior imaging--recent CT scan reviewed.  Large mass in right retroperitoneum.  Compression of the renal artery and renal vein.  Stent has migrated distally but the proximal tip is still in the right mid ureter.      Impression/Assessment:  1.  Urinary retention with catheter in place  2.  Progressive urothelial carcinoma of the right renal pelvis, now involving the retroperitoneum and periaortic area.  Patient has declined further treatment.  Entering hospice  Plan:  1.  He will continue tamsulosin  for now  2.  I will have him come in in 3 days, Friday morning for trial of voiding with nurse visit  3.  I will have him come back in 1 week to check in with me

## 2024-05-02 ENCOUNTER — Other Ambulatory Visit: Payer: Self-pay

## 2024-05-02 DIAGNOSIS — Z7189 Other specified counseling: Secondary | ICD-10-CM

## 2024-05-02 DIAGNOSIS — C678 Malignant neoplasm of overlapping sites of bladder: Secondary | ICD-10-CM

## 2024-05-02 DIAGNOSIS — Z515 Encounter for palliative care: Secondary | ICD-10-CM

## 2024-05-02 NOTE — Progress Notes (Signed)
 "    Palliative Medicine [facility]  [phone] Fax:([phone]) [phone]   Name: Cameron Wu Date: 05/02/2024 MRN: [id number]  DOB: Aug 27, 1950  Patient Care Team: [name], MD as PCP - General (Internal Medicine) Pickenpack-Cousar, Fannie SAILOR, NP as Nurse Practitioner [facility] and Palliative Medicine)   I connected with Cameron Wu on 04/30/2024 at  4:15 PM EST by telephone and verified that I am speaking with the correct person using two identifiers.   I discussed the limitations, risks, security and privacy concerns of performing an evaluation and management service by telemedicine and the availability of in-person appointments. I also discussed with the patient that there may be a patient responsible charge related to this service. The patient expressed understanding and agreed to proceed.   Other persons participating in the visit and their role in the encounter:Wife   Patients location: Home   Providers location: [facility]   INTERVAL HISTORY: Cameron Wu is a 74 y.o. male with oncologic medical history including high-grade urothelial carcinoma with right renal pelvic mass (2022) currently undergoing treatment with Enfortumab and pembrolizumab , NMIBC, Hairy cell leukemia. Palliative is seeing patient for symptom management and goals of care.   SOCIAL HISTORY:     reports that he quit smoking about 18 years ago. His smoking use included cigarettes. He has never used smokeless tobacco. He reports current alcohol use of about 4.0 standard drinks of alcohol per week. He reports that he does not use drugs.  ADVANCE DIRECTIVES:  Directives on file. His wife Cameron Wu is his primary medical decision-maker   CODE STATUS: Full code  PAST MEDICAL HISTORY: Past Medical History:  Diagnosis Date   Abnormal liver function tests 05/03/2011   Arthritis    Bladder cancer (HCC) 02/2021   high grade nonmuscle invasive bladder cancer   BPH (benign  prostatic hypertrophy) 05/03/2011   Chronic kidney disease    Complication of anesthesia    coughs alot and has a very dry, scratchy throat per patient   Elevated transaminase level    GERD (gastroesophageal reflux disease)    Hairy cell leukemia, in remission (HCC) 1992   remission since 2008   History of kidney stones    Hypertension    IBS (irritable bowel syndrome)    Irritable bowel syndrome    2 yrs ago, states he had inflammation around anus. Biopsy: inclusive   Lumbar foraminal stenosis 12/31/2019   Lumbar radiculopathy 11/15/2019   Sleep apnea     ALLERGIES:  is allergic to penicillins and pollen extract.  MEDICATIONS:  Current Outpatient Medications  Medication Sig Dispense Refill   Calcium  Carb-Cholecalciferol (CALCIUM  + VITAMIN D3 PO) Take 1 tablet by mouth daily with lunch. (Patient not taking: Reported on 05/01/2024)     docusate sodium  (COLACE) 100 MG capsule Take 1 capsule (100 mg total) by mouth daily as needed for up to 30 doses. (Patient not taking: Reported on 05/01/2024) 30 capsule 0   donepezil  (ARICEPT ) 10 MG tablet Take 10 mg by mouth daily. (Patient not taking: Reported on 05/01/2024)     doxylamine , Sleep, (UNISOM ) 25 MG tablet Take 1 tablet (25 mg total) by mouth at bedtime as needed for sleep. (Patient not taking: Reported on 05/01/2024) 30 tablet 3   dronabinol  (MARINOL ) 2.5 MG capsule Take 1 capsule (2.5 mg total) by mouth 2 (two) times daily before a meal. (Patient not taking: Reported on 05/01/2024) 60 capsule 0   finasteride  (PROSCAR ) 5 MG tablet Take 1 tablet (  5 mg total) by mouth every other day. (Patient not taking: Reported on 05/01/2024) 90 tablet 3   Ginkgo Biloba (GINKOBA PO) Take 120 mg by mouth daily. (Patient not taking: Reported on 05/01/2024)     Homeopathic Products (LEG CRAMPS PM SL) Place 2 tablets under the tongue daily as needed (leg cramps). (Patient not taking: Reported on 05/01/2024)     Lactulose  20 GM/30ML SOLN Take 30 mLs (20 g total) by  mouth 3 (three) times daily as needed. (Patient not taking: Reported on 05/01/2024) 237 mL 2   levocetirizine (XYZAL) 5 MG tablet Take 5 mg by mouth at bedtime as needed for allergies. (Patient not taking: Reported on 05/01/2024)     loperamide (IMODIUM) 2 MG capsule Take 2 mg by mouth as needed for diarrhea or loose stools. (Patient not taking: Reported on 05/01/2024)     Menthol , Topical Analgesic, (BIOFREEZE COOL THE PAIN) 4 % GEL Apply 1 Application topically as needed (pain). (Patient not taking: Reported on 05/01/2024)     Multiple Vitamins-Minerals (ONE-A-DAY MENS 50+) TABS Take 1 tablet by mouth daily. (Patient not taking: Reported on 05/01/2024)     Naphazoline-Pheniramine (OPCON-A) 0.027-0.315 % SOLN Place 1 drop into both eyes as needed (eye irritation). (Patient not taking: Reported on 05/01/2024)     nitrofurantoin , macrocrystal-monohydrate, (MACROBID ) 100 MG capsule Take 1 capsule (100 mg total) by mouth 2 (two) times daily. 14 capsule 0   oxyCODONE -acetaminophen  (PERCOCET) 10-325 MG tablet Take 1 tablet by mouth every 4 (four) hours as needed for pain. 45 tablet 0   pantoprazole (PROTONIX) 40 MG tablet Take 40 mg by mouth every morning. (Patient not taking: Reported on 05/01/2024)     phenazopyridine  (PYRIDIUM ) 100 MG tablet Take 1 tablet (100 mg total) by mouth 2 (two) times daily as needed for pain. (Patient not taking: Reported on 05/01/2024) 10 tablet 0   rosuvastatin  (CRESTOR ) 10 MG tablet Take 10 mg by mouth at bedtime. (Patient not taking: Reported on 05/01/2024)     Saline (ARY NASAL MIST ALLERGY/SINUS) 2.65 % SOLN Place 1 spray into the nose as needed (congestion). (Patient not taking: Reported on 05/01/2024)     simethicone  (MYLICON) 125 MG chewable tablet Chew 125 mg by mouth 2 (two) times daily as needed for flatulence. (Patient not taking: Reported on 05/01/2024)     tamsulosin  (FLOMAX ) 0.4 MG CAPS capsule Take 1 capsule (0.4 mg total) by mouth every other day. 90 capsule 3    triamcinolone  ointment (KENALOG ) 0.5 % Apply 1 Application topically 2 (two) times daily. (Patient not taking: Reported on 05/01/2024) 30 g 0   Vibegron  75 MG TABS Take 1 tablet (75 mg total) by mouth at bedtime. (Patient not taking: Reported on 05/01/2024) 30 tablet 1   No current facility-administered medications for this visit.   Facility-Administered Medications Ordered in Other Visits  Medication Dose Route Frequency Provider Last Rate Last Admin   gemcitabine  (GEMZAR ) chemo syringe for bladder instillation 2,000 mg  2,000 mg Bladder Instillation Once Dahlstedt, Stephen, MD        VITAL SIGNS: There were no vitals taken for this visit. There were no vitals filed for this visit.  Estimated body mass index is 20.48 kg/m as calculated from the following:   Height as of 04/28/24: 6' (1.829 m).   Weight as of 04/28/24: 151 lb (68.5 kg).   PERFORMANCE STATUS (ECOG) : 1 - Symptomatic but completely ambulatory  IMPRESSION: Discussed the use of AI scribe software for clinical note transcription  with the patient, who gave verbal consent to proceed.  History of Present Illness Cameron Wu is a 74 year old male with high grade urothelial cancer who I connected with by phone. His wife is present on the call and engages in primary discussions on behalf of patient.   He experienced significant penile swelling and urinary retention, necessitating an emergency room visit where a catheter was placed. He is awaiting a follow-up appointment with his urologist. His urine is described as very brown and concentrated. Patient is hopeful catheter can be removed however I shared concerns that it may be necessary to continue with use given the extent of his cancer and high likelihood that he will experience recurrent urine retention. Wife verbalized understanding.   Cameron Wu continues to experience poor oral intake, consuming only part of scrambled eggs and a cinnamon bun today, low fluid intake, raising  concerns about dehydration. Given ongoing decline recommend continuing to allow foods as he desires with understanding comfort feedings and nutrition there will be days that he does not have a desire to eat.   Cameron Wu reports patient has been experiencing intermittent confusion and worsening short-term memory issues, often forgetting the day or needing reminders about daily activities. We discussed this may be multifactorial with known UTI and disease progression.   His wife is currently administering all medications to ensure proper dosing. His caregiver reports difficulty in communication and managing daily tasks, indicating a decline in his cognitive function.  Cameron Wu's pain is well managed on current dose of oxycodone  10/325mg . No adjustments to regimen.    Goals of Care We discussed recent CT scans at length that were completed during ER work-up. Unfortunately his cancer has progressed contributing to his symptom burden.   Mr. And Cameron Wu are clear in expressed wishes that given patients ongoing decline and increase in symptom burden his main focus is on his comfort for what time he has left. He desires not to be in a suffering state but to be at home amongst his family for what time he has left. They would like to follow-up with urology but are now open to hospice support in the home.   Education provided on hospice's goals of care and philosophy of care. Wife request referral be placed with [facility] in [place]. She understands that we will wait until he see's Urology and determined plan and proceed with hospice after to prevent any delays in their service. She verbalized understanding and appreciation.   04/16/24: He is concerned about his quality of life and the potential burden on his family. He is contemplating writing a letter to family and friends about his condition and future, but is unsure about the timing and content of such a letter. He is considering assistance from a  child psychotherapist to help with this task and recognizing his feelings as he begins to experience disease related symptom burden and prepare for end-of-life at some point. He shares he has close relationships with many different people in his lives and wish to write these letters to share what he is facing and his feelings before he reaches a place in his health journey that doesn't allow him to do so. I shared explaining his cancer state and the decision to focus on comfort while also acknowledging that he is living life one day at a time continuing to make memories while focusing on his quality of life.   Cameron Wu shares that he has not desire to suffer and questions  how does he recognize that life is changing. Education provided on expectations at end-of-life and symptom onset. I empathetically approached discussions regarding outpatient hospice support that will allow for additional support and resources in the home to navigate what time he has left while offering support to family as well. He verbalized understanding and aware a referral can be placed at anytime by notifying the team.   04/09/24: Cameron Wu and his wife have discussed his wishes. At this time they wish to take things one day at a time focusing solely on his quality of life and aggressively managing symptoms. Cameron Wu expresses desires not to suffer. They are not ready to enroll in hospice however is aware this will be most appropriate in the near future.   04/05/24: Cameron Wu is realistic in his understanding of incurable cancer and prognosis with and without pursuing additional treatment. He is concerned about managing his pain and maintaining his : quality of life as this is most important to him.   He is currently scheduled to undergo chemotherapy but feels it will not provide the desired results and side effects will significantly impact what quality of life he has left. He is considering discontinuing chemotherapy due to its lack  of efficacy and potential side effects. We discussed best case and worst case scenario at length. He is appropriately concerned that what time he has left maybe impacted by symptoms related to treatment with understanding it will not change overall prognosis. He wishes to take each day at a time allowing him to continue spending time with his family and making memories feeling the best that he can.   He has a family history of cancer, with both parents having died from breast and lung cancer. He shares their experience where decisions were made to discontinue treatment due to health challenges and quality of life. Emotional support provided.   He knows there is not a right or wrong answer and although it may be difficulty to make decisions and family may not agree the right answer is what he feels is best for him and his quality of life for the time that he has. We do not have a crystal ball to determine exact life expectancy or if symptoms will be minimal. We discussed options of continuing with treatment and discontinuing if he does not feel well versus not.   Cameron Wu expresses anxiousness of not having the medical support he may need or if a new medication come to the forefront having ability to pursue. We discussed he may change his mind at anytime and the medical team will remain in support. We discussed continued telephone visits as needed to manage symptoms.   I empathetically approached discussions and provided education on outpatient hospice support in the home. He verbalized understanding.   All questions answered and support provided. Cameron Wu would like to discuss further with his wife and make decisions that they are both comfortable with.   Assessment & Plan Cancer related pain management Pain is worsening and persistent, exacerbated by overexertion at times. Current oxycodone  regimen is insufficient for pain control. - Increased oxycodone  to two tablets in the evening to assess  tolerance and effectiveness. - Continue oxycodone  10mg  every 4 hours as needed.  - Will reassess pain control and functional status after one week.  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 Foley catheter  in place due to inability to urinate. Awaiting urology consultation to determine catheter management. Potential need for catheter to remain if mass occludes ureters. - Await urology consultation with Dr. Matilda to determine catheter management. - Will contact urology office if no response by early afternoon. - Will coordinate with [facility] for potential hospice care, considering urology recommendations.  Dehydration and poor oral intake Significant dehydration and poor oral intake with minimal food and fluid consumption. Consumed only part of scrambled eggs and a cinnamon bun today. Dehydration likely contributing to overall health decline. - Encouraged oral intake of fluids and nutrition. - Will consider [facility]'s role in managing hydration and nutrition.  Cognitive impairment with intermittent confusion Intermittent confusion and short-term memory issues, possibly related to disease progression, medication, or urinary tract changes. Confusion may be multifactorial. - Continue to monitor cognitive status and manage symptoms as needed.  Palliative care management Discussion of transitioning to hospice care due to worsening symptoms and poor prognosis. He and family agree to pursue hospice care. [facility] chosen for hospice services. - Sent referral to [facility] for hospice care. - Will coordinate with [facility] regarding timing of hospice initiation, considering urology consultation. - Will discuss with [facility] the possibility of managing Foley catheter if needed.  No follow-up needed at this time. I have notified Dr. Tina of plans.   Patient  expressed understanding and was in agreement with this plan. He also understands that He can call the clinic at any time with any questions, concerns, or complaints.   Any controlled substances utilized were prescribed in the context of palliative care. PDMP has been reviewed.   I personally spent a total of 50 minutes in the care of the patient today including getting/reviewing separately obtained history, performing a medically appropriate exam/evaluation, counseling and educating, placing orders, referring and communicating with other health care professionals, documenting clinical information in the EHR, communicating results, and coordinating care. Visit consisted of counseling and education dealing with the complex and emotionally intense issues of symptom management and palliative care in the setting of serious and potentially life-threatening illness.  Levon Borer, AGPCNP-BC  Palliative Medicine Team/[facility]    "

## 2024-05-02 NOTE — Progress Notes (Signed)
 Orders placed for [facility] hospice support and fax confirmed.

## 2024-05-04 ENCOUNTER — Ambulatory Visit (INDEPENDENT_AMBULATORY_CARE_PROVIDER_SITE_OTHER)

## 2024-05-04 DIAGNOSIS — C678 Malignant neoplasm of overlapping sites of bladder: Secondary | ICD-10-CM | POA: Diagnosis not present

## 2024-05-04 LAB — BLADDER SCAN AMB NON-IMAGING: Scan Result: 76

## 2024-05-04 MED ORDER — CIPROFLOXACIN HCL 500 MG PO TABS
500.0000 mg | ORAL_TABLET | Freq: Once | ORAL | Status: AC
  Administered 2024-05-04: 500 mg via ORAL

## 2024-05-04 NOTE — Addendum Note (Signed)
 Addended by: GRETTA MASTERS R on: 05/04/2024 09:53 AM   Modules accepted: Orders

## 2024-05-04 NOTE — Progress Notes (Addendum)
 Fill and Pull Catheter Removal  Patient is present today for a catheter removal due to Malignant neoplasm of overlapping .  225 ml of sterile water  was instilled into the bladder when the patient felt the urge to urinate. 10 ml of water  was then drained from the balloon.  A 16 FR foley cath was removed from the bladder no complications were noted .  Foley catheter intact and time of removal. Patient as then given some time to void on their own.  Patient can void  150 ml on their own after some time.  Patient tolerated well.  One oral prophylactic antibiotic given per MD orders  Performed by: Carlos, CMA  Follow up/ Additional notes: Keep OV with Dr. Matilda    Bladder Scan completed today due to reasonMalignant neoplasm of overlapping sites of bladder [facility])   Patient can void prior to the bladder scan. Bladder scan result: 76

## 2024-05-07 NOTE — Progress Notes (Unsigned)
 History of Present Illness: Cameron Wu is here for followup of bladder cancer.   11.7.2022: TURBT-  Pathology-high-grade nonmuscle invasive bladder cancer.--6.5 cm posterior wall lesion.  Muscularis present in specimen.  He did not receive gemcitabine .   12.8.2022: Repeat TURBT--8 mm Lt anterior wall papillary lesion, repeat posterior bladder wall lesion resection. Both specimens revealed HG NMIBC. Gemcitabine  administered.   2.8.2023: Induction BCG (6 rxs) completed.   3.21.2023: Here for follow-up with cystoscopy.    5.10.2023: Completed first BCG maintenance   6.27.2023: Here for surveillance cystoscopy.  Significant erythematous area in his trigonal region as well as 2-3 papillary lesions in his bladder dome.   7.13.2023: He underwent biopsy of his trigonal area as well as the papillary lesions. Path--Benign submucosa with chronic inflammation . He has a very thin bladder wall, and gemcitabine  not administered.He did well w/ Bx. Despite negative pathology, he did have papillary recurrences @ dome of bladder.   8.28.2023: Completed 3 maintenance BCG Rxs.   10.10.2023: Cystoscopy was negative.  Cytology inconclusive.   12.19.2023: Completed third round of maintenance BCG   2.13.2024: Cysto negative, cytology + for HG urothelial carcinoma.   3.18.2024: Cysto, bladder biopsies, (B) RGPs. RGPs nml, path--Benign urothelium and submucosa with chronic inflammation and reactive changes    5.9.2024: Completed maintenance BCG  6.18.2024: Cysto negative, cytology atypical/inconclusive.  11.26.2024: completed 5th round of maintenance BCG  1.7.2025: Here for routine check.  He had a hip replacement to refracture while playing pickle ball in the early fall.  He is doing quite well.  No real issues from his BCG.  5.13.2025: Referred to [facility], Dr. Texie.  Underwent cystoscopy, bilateral retrograde pyelograms, bilateral renal washings, biopsy of prostatic urethra, bladder biopsy.   Bluelight cystoscopy was utilized.  There was no obvious fluorescein abnormality in the prostatic urethra or the bladder.  Retrograde study of the left upper tract was normal.  Retrograde study of the right upper tract revealed filling defects in the right renal pelvis.  The cytologies of renal washings were benign bilaterally.  Biopsies of bladder and a prostatic urethra were were benign.  He is not having significant stent pain.  1.13.2026: Here with urinary retention.  He ended up not undergoing right nephro ureterectomy as he had extra renal disease that has progressed despite being on appropriate therapy.  He has decided to pull the plug on any further curative or palliative infusions.  Went to the emergency room 4 days ago with inability to urinate.  Catheter was placed.  He is still on tamsulosin .  Past Medical History:  Diagnosis Date   Abnormal liver function tests 05/03/2011   Arthritis    Bladder cancer (HCC) 02/2021   high grade nonmuscle invasive bladder cancer   BPH (benign prostatic hypertrophy) 05/03/2011   Chronic kidney disease    Complication of anesthesia    coughs alot and has a very dry, scratchy throat per patient   Elevated transaminase level    GERD (gastroesophageal reflux disease)    Hairy cell leukemia, in remission (HCC) 1992   remission since 2008   History of kidney stones    Hypertension    IBS (irritable bowel syndrome)    Irritable bowel syndrome    2 yrs ago, states he had inflammation around anus. Biopsy: inclusive   Lumbar foraminal stenosis 12/31/2019   Lumbar radiculopathy 11/15/2019   Sleep apnea     Past Surgical History:  Procedure Laterality Date   BACK SURGERY     BONE  MARROW BIOPSY     2007 and 1992   CATARACT EXTRACTION W/PHACO Right 08/15/2020   Procedure: CATARACT EXTRACTION PHACO AND INTRAOCULAR LENS PLACEMENT RIGHT EYE;  Surgeon: Harrie Agent, MD;  Location: [facility];  Service: Ophthalmology;  Laterality: Right;  right CDE=7.75    COLONOSCOPY  04/19/2006   with polyps   COLONOSCOPY  09/30/2011   Procedure: COLONOSCOPY;  Surgeon: Claudis RAYMOND Rivet, MD;  Location: [facility];  Service: Endoscopy;  Laterality: N/A;  730   COLONOSCOPY N/A 01/11/2019   Procedure: COLONOSCOPY;  Surgeon: Rivet Claudis RAYMOND, MD;  Location: [facility];  Service: Endoscopy;  Laterality: N/A;  100-office move to 9:30am   CYSTOSCOPY W/ RETROGRADES Bilateral 02/23/2021   Procedure: CYSTOSCOPY WITH RETROGRADE PYELOGRAM;  Surgeon: Matilda Senior, MD;  Location: [facility];  Service: Urology;  Laterality: Bilateral;   CYSTOSCOPY W/ RETROGRADES Bilateral 07/05/2022   Procedure: CYSTOSCOPY WITH RETROGRADE PYELOGRAM;  Surgeon: Matilda Senior, MD;  Location: [facility];  Service: Urology;  Laterality: Bilateral;   CYSTOSCOPY W/ RETROGRADES Right 09/30/2023   Procedure: CYSTOSCOPY, WITH RETROGRADE PYELOGRAM, BIOPSY OF MASS;  Surgeon: Selma Donnice SAUNDERS, MD;  Location: [facility];  Service: Urology;  Laterality: Right;   CYSTOSCOPY WITH BIOPSY N/A 07/05/2022   Procedure: CYSTOSCOPY WITH BIOPSY;  Surgeon: Matilda Senior, MD;  Location: [facility];  Service: Urology;  Laterality: N/A;  30 MINS   CYSTOSCOPY WITH URETEROSCOPY AND STENT PLACEMENT Right 09/30/2023   Procedure: ERNESTA, WITH STENT EXCHANGE;  Surgeon: Selma Donnice SAUNDERS, MD;  Location: [facility];  Service: Urology;  Laterality: Right;   HX COLON POLYPS     IR IMAGING GUIDED PORT INSERTION  11/24/2023   TONSILLECTOMY     childhood   TOTAL HIP ARTHROPLASTY Right 12/19/2022   Procedure: RIGHT POSTERIOR TOTAL HIP ARTHROPLASTY;  Surgeon: Edna Toribio LABOR, MD;  Location: [facility];  Service: Orthopedics;  Laterality: Right;   TRANSFORAMINAL LUMBAR INTERBODY FUSION W/ MIS 1 LEVEL Right 11/18/2020   Procedure: Right Lumbar Five-Sacral One Minimally invasive transforaminal lumbar interbody fusion;  Surgeon: Cheryle Debby LABOR, MD;  Location: [facility];  Service:  Neurosurgery;  Laterality: Right;   TRANSURETHRAL RESECTION OF BLADDER TUMOR N/A 03/26/2021   Procedure: REPEAT TRANSURETHRAL RESECTION OF BLADDER TUMOR (TURBT)/ POST OPERATIVE INSTILLATION OF GEMCITABINE ;  Surgeon: Matilda Senior, MD;  Location: [facility];  Service: Urology;  Laterality: N/A;   TRANSURETHRAL RESECTION OF BLADDER TUMOR WITH MITOMYCIN -C N/A 02/23/2021   Procedure: TRANSURETHRAL RESECTION OF BLADDER TUMOR;  Surgeon: Matilda Senior, MD;  Location: [facility];  Service: Urology;  Laterality: N/A;   TRANSURETHRAL RESECTION OF BLADDER TUMOR WITH MITOMYCIN -C N/A 10/29/2021   Procedure: TRANSURETHRAL RESECTION OF BLADDER TUMOR WITH GEMCITABINE ;  Surgeon: Matilda Senior, MD;  Location: [facility];  Service: Urology;  Laterality: N/A;    Home Medications:  Allergies as of 05/08/2024       Reactions   Penicillins Swelling, Other (See Comments)   Pollen Extract         Medication List        Accurate as of May 07, 2024  1:11 PM. If you have any questions, ask your nurse or doctor.          Lactulose  20 GM/30ML Soln Take 30 mLs (20 g total) by mouth 3 (three) times daily as needed.   nitrofurantoin  (macrocrystal-monohydrate) 100 MG capsule Commonly known as: MACROBID  Take 1 capsule (100 mg total) by mouth 2 (two)  times daily.   oxyCODONE -acetaminophen  10-325 MG tablet Commonly known as: Percocet Take 1 tablet by mouth every 4 (four) hours as needed for pain.   tamsulosin  0.4 MG Caps capsule Commonly known as: FLOMAX  Take 1 capsule (0.4 mg total) by mouth every other day.        Allergies:  Allergies  Allergen Reactions   Penicillins Swelling and Other (See Comments)   Pollen Extract     No family history on file.  Social History:  reports that he quit smoking about 18 years ago. His smoking use included cigarettes. He has never used smokeless tobacco. He reports current alcohol use of about 4.0  standard drinks of alcohol per week. He reports that he does not use drugs.  ROS: A complete review of systems was performed.  All systems are negative except for pertinent findings as noted.  Physical Exam:  Vital signs in last 24 hours: There were no vitals taken for this visit. Constitutional:  Alert and oriented, cachectic in appearance with sarcopenia. Cardiovascular: Regular rate  Respiratory: Normal respiratory effort Neurologic: Grossly intact, no focal deficits Psychiatric: Normal mood and affect  I have reviewed prior pt notes--urology and oncology both in [place] and [place]  I have independently reviewed prior imaging--recent CT scan reviewed.  Large mass in right retroperitoneum.  Compression of the renal artery and renal vein.  Stent has migrated distally but the proximal tip is still in the right mid ureter.      Impression/Assessment:  1.  Urinary retention with catheter in place  2.  Progressive urothelial carcinoma of the right renal pelvis, now involving the retroperitoneum and periaortic area.  Patient has declined further treatment.  Entering hospice  Plan:  1.  He will continue tamsulosin  for now  2.  I will have him come in in 3 days, Friday morning for trial of voiding with nurse visit  3.  I will have him come back in 1 week to check in with me

## 2024-05-08 ENCOUNTER — Encounter: Payer: Self-pay | Admitting: Urology

## 2024-05-08 ENCOUNTER — Ambulatory Visit: Admitting: Urology

## 2024-05-08 VITALS — BP 103/63 | HR 88

## 2024-05-08 DIAGNOSIS — R339 Retention of urine, unspecified: Secondary | ICD-10-CM | POA: Diagnosis not present

## 2024-05-08 DIAGNOSIS — C786 Secondary malignant neoplasm of retroperitoneum and peritoneum: Secondary | ICD-10-CM | POA: Diagnosis not present

## 2024-05-08 DIAGNOSIS — Z960 Presence of urogenital implants: Secondary | ICD-10-CM | POA: Diagnosis not present

## 2024-05-08 DIAGNOSIS — C641 Malignant neoplasm of right kidney, except renal pelvis: Secondary | ICD-10-CM | POA: Diagnosis not present

## 2024-05-08 DIAGNOSIS — C678 Malignant neoplasm of overlapping sites of bladder: Secondary | ICD-10-CM

## 2024-05-08 DIAGNOSIS — N401 Enlarged prostate with lower urinary tract symptoms: Secondary | ICD-10-CM

## 2024-05-08 DIAGNOSIS — C7910 Secondary malignant neoplasm of unspecified urinary organs: Secondary | ICD-10-CM

## 2024-05-08 LAB — URINALYSIS, ROUTINE W REFLEX MICROSCOPIC
Bilirubin, UA: NEGATIVE
Glucose, UA: NEGATIVE
Nitrite, UA: POSITIVE — AB
Specific Gravity, UA: 1.02 (ref 1.005–1.030)
Urobilinogen, Ur: 2 mg/dL — ABNORMAL HIGH (ref 0.2–1.0)
pH, UA: 5.5 (ref 5.0–7.5)

## 2024-05-08 LAB — MICROSCOPIC EXAMINATION
RBC, Urine: >30 /HPF — AB (ref 0–2)
WBC, UA: >30 /HPF — AB (ref 0–5)

## 2024-05-08 MED ORDER — CEPHALEXIN 500 MG PO CAPS: 500.0000 mg | ORAL_CAPSULE | Freq: Two times a day (BID) | ORAL | 0 refills | Status: AC

## 2024-05-10 LAB — URINE CULTURE

## 2024-05-14 ENCOUNTER — Ambulatory Visit: Payer: Self-pay | Admitting: Urology

## 2024-08-24 ENCOUNTER — Other Ambulatory Visit

## 2024-08-24 ENCOUNTER — Inpatient Hospital Stay: Attending: Hematology | Admitting: Hematology
